# Patient Record
Sex: Female | Born: 1955 | Race: White | Hispanic: No | Smoking: Never smoker
Health system: Southern US, Community
[De-identification: ages and names within clinical notes are randomized; demographics above are authoritative.]

## PROBLEM LIST (undated history)

## (undated) DIAGNOSIS — R197 Diarrhea, unspecified: Secondary | ICD-10-CM

## (undated) DIAGNOSIS — E1165 Type 2 diabetes mellitus with hyperglycemia: Secondary | ICD-10-CM

## (undated) DIAGNOSIS — G4730 Sleep apnea, unspecified: Secondary | ICD-10-CM

## (undated) DIAGNOSIS — J45909 Unspecified asthma, uncomplicated: Secondary | ICD-10-CM

## (undated) DIAGNOSIS — R51 Headache: Secondary | ICD-10-CM

## (undated) DIAGNOSIS — R011 Cardiac murmur, unspecified: Secondary | ICD-10-CM

## (undated) DIAGNOSIS — I10 Essential (primary) hypertension: Secondary | ICD-10-CM

## (undated) DIAGNOSIS — T753XXA Motion sickness, initial encounter: Secondary | ICD-10-CM

## (undated) DIAGNOSIS — R519 Headache, unspecified: Secondary | ICD-10-CM

## (undated) DIAGNOSIS — K219 Gastro-esophageal reflux disease without esophagitis: Secondary | ICD-10-CM

## (undated) DIAGNOSIS — R0600 Dyspnea, unspecified: Secondary | ICD-10-CM

## (undated) DIAGNOSIS — H9190 Unspecified hearing loss, unspecified ear: Secondary | ICD-10-CM

## (undated) DIAGNOSIS — E119 Type 2 diabetes mellitus without complications: Secondary | ICD-10-CM

## (undated) HISTORY — DX: Type 2 diabetes mellitus with hyperglycemia: E11.65

## (undated) HISTORY — PX: BREAST EXCISIONAL BIOPSY: SUR124

---

## 2003-10-19 HISTORY — PX: BREAST BIOPSY: SHX20

## 2004-10-18 HISTORY — PX: ABDOMINAL HYSTERECTOMY: SHX81

## 2005-03-31 ENCOUNTER — Ambulatory Visit: Payer: Self-pay | Admitting: Obstetrics and Gynecology

## 2006-04-25 DIAGNOSIS — I152 Hypertension secondary to endocrine disorders: Secondary | ICD-10-CM | POA: Insufficient documentation

## 2006-04-29 ENCOUNTER — Ambulatory Visit: Payer: Self-pay | Admitting: Obstetrics and Gynecology

## 2007-05-02 ENCOUNTER — Ambulatory Visit: Payer: Self-pay | Admitting: Obstetrics and Gynecology

## 2008-05-03 ENCOUNTER — Ambulatory Visit: Payer: Self-pay | Admitting: Obstetrics and Gynecology

## 2008-05-17 ENCOUNTER — Ambulatory Visit: Payer: Self-pay

## 2008-06-07 ENCOUNTER — Ambulatory Visit: Payer: Self-pay | Admitting: Gastroenterology

## 2008-06-07 DIAGNOSIS — K5730 Diverticulosis of large intestine without perforation or abscess without bleeding: Secondary | ICD-10-CM | POA: Insufficient documentation

## 2008-07-30 DIAGNOSIS — G4730 Sleep apnea, unspecified: Secondary | ICD-10-CM | POA: Insufficient documentation

## 2009-05-14 ENCOUNTER — Ambulatory Visit: Payer: Self-pay | Admitting: Obstetrics and Gynecology

## 2009-11-17 ENCOUNTER — Ambulatory Visit: Payer: Self-pay | Admitting: Family Medicine

## 2009-12-11 ENCOUNTER — Ambulatory Visit: Payer: Self-pay | Admitting: Family Medicine

## 2009-12-11 DIAGNOSIS — E1169 Type 2 diabetes mellitus with other specified complication: Secondary | ICD-10-CM | POA: Insufficient documentation

## 2010-01-16 ENCOUNTER — Ambulatory Visit: Payer: Self-pay

## 2010-05-21 ENCOUNTER — Ambulatory Visit: Payer: Self-pay | Admitting: Obstetrics and Gynecology

## 2012-05-15 ENCOUNTER — Ambulatory Visit: Payer: Self-pay | Admitting: Family Medicine

## 2012-05-26 ENCOUNTER — Ambulatory Visit: Payer: Self-pay | Admitting: Family Medicine

## 2012-06-01 ENCOUNTER — Ambulatory Visit: Payer: Self-pay | Admitting: Obstetrics and Gynecology

## 2013-08-07 ENCOUNTER — Ambulatory Visit: Payer: Self-pay | Admitting: Obstetrics and Gynecology

## 2014-01-22 IMAGING — CR DG SHOULDER 3+V*L*
1 series · 3 of 3 positions shown · non-contrast
Comparison: None

REASON FOR EXAM: COMMENTS:

PROCEDURE:     KDR - KDXR SHOULDER LEFT COMPLETE  - May 15, 2012  [DATE]
RESULT:      History: Fall

[Series 1: internal rotate · 0.17mm/px · 3 of 3 slices shown]
[im 1/3]
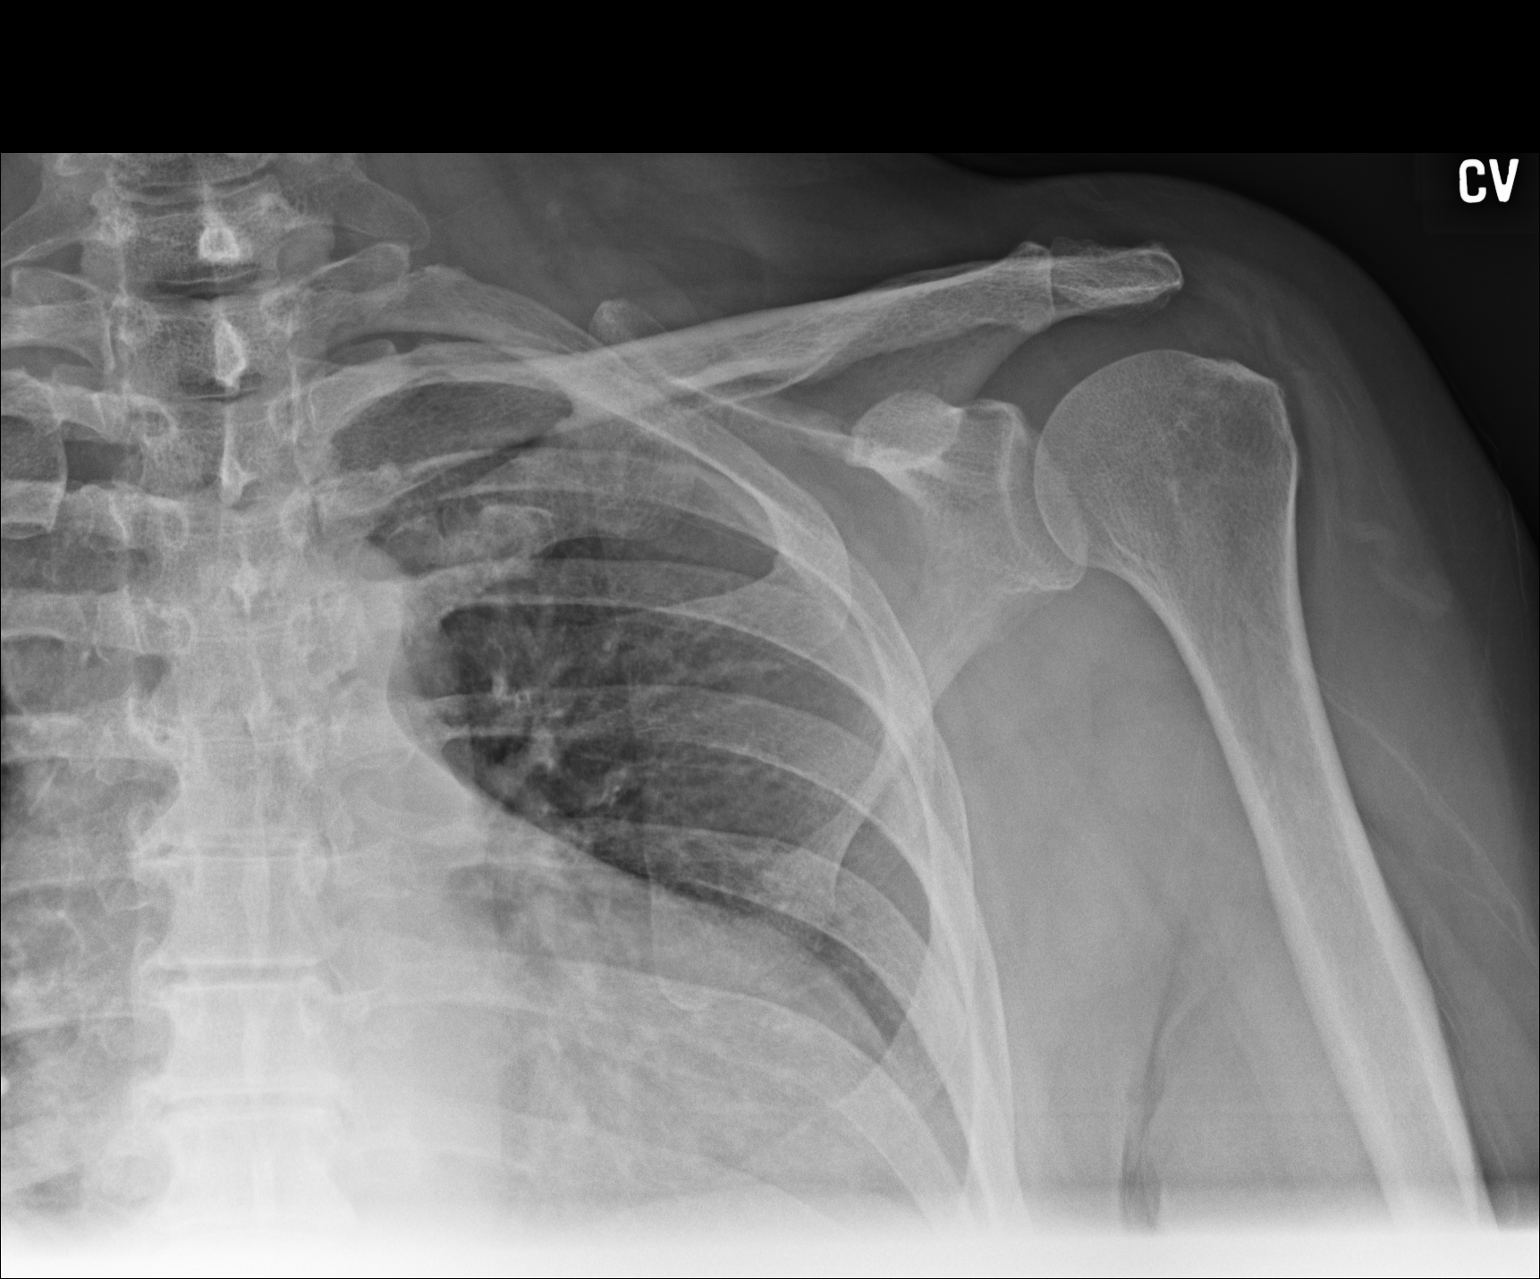
[im 2/3]
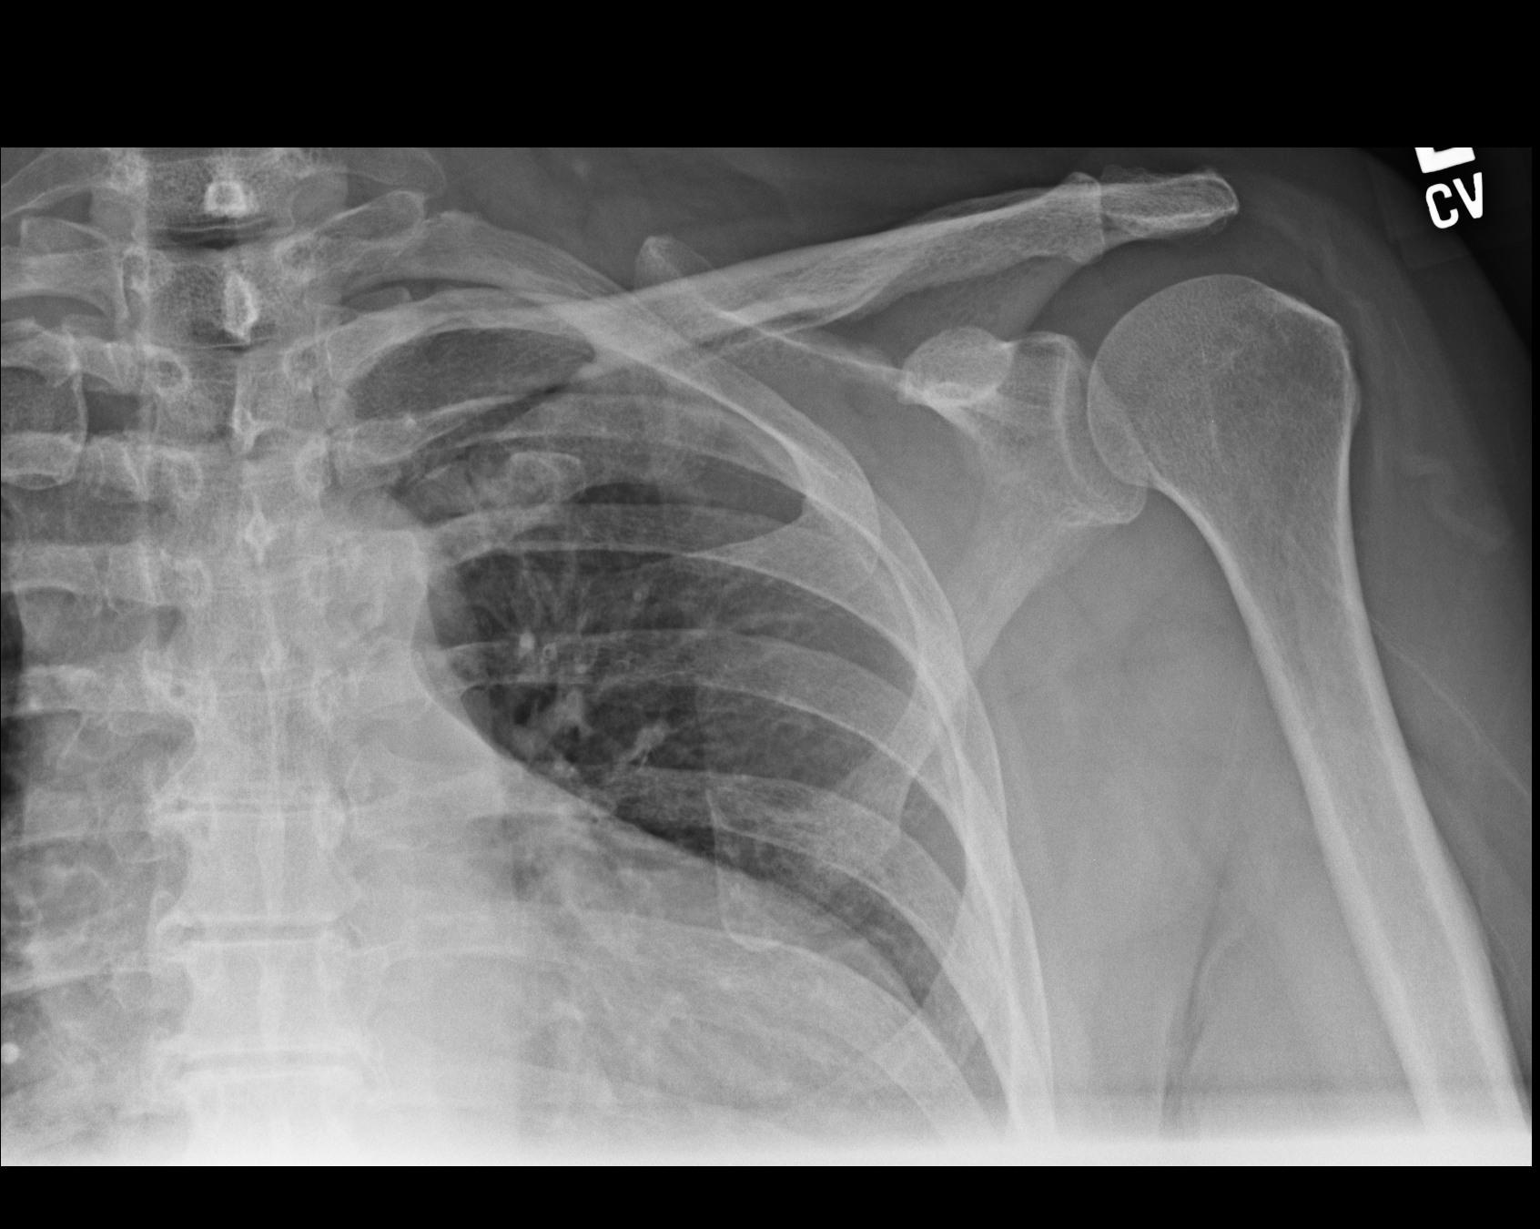
[im 3/3]
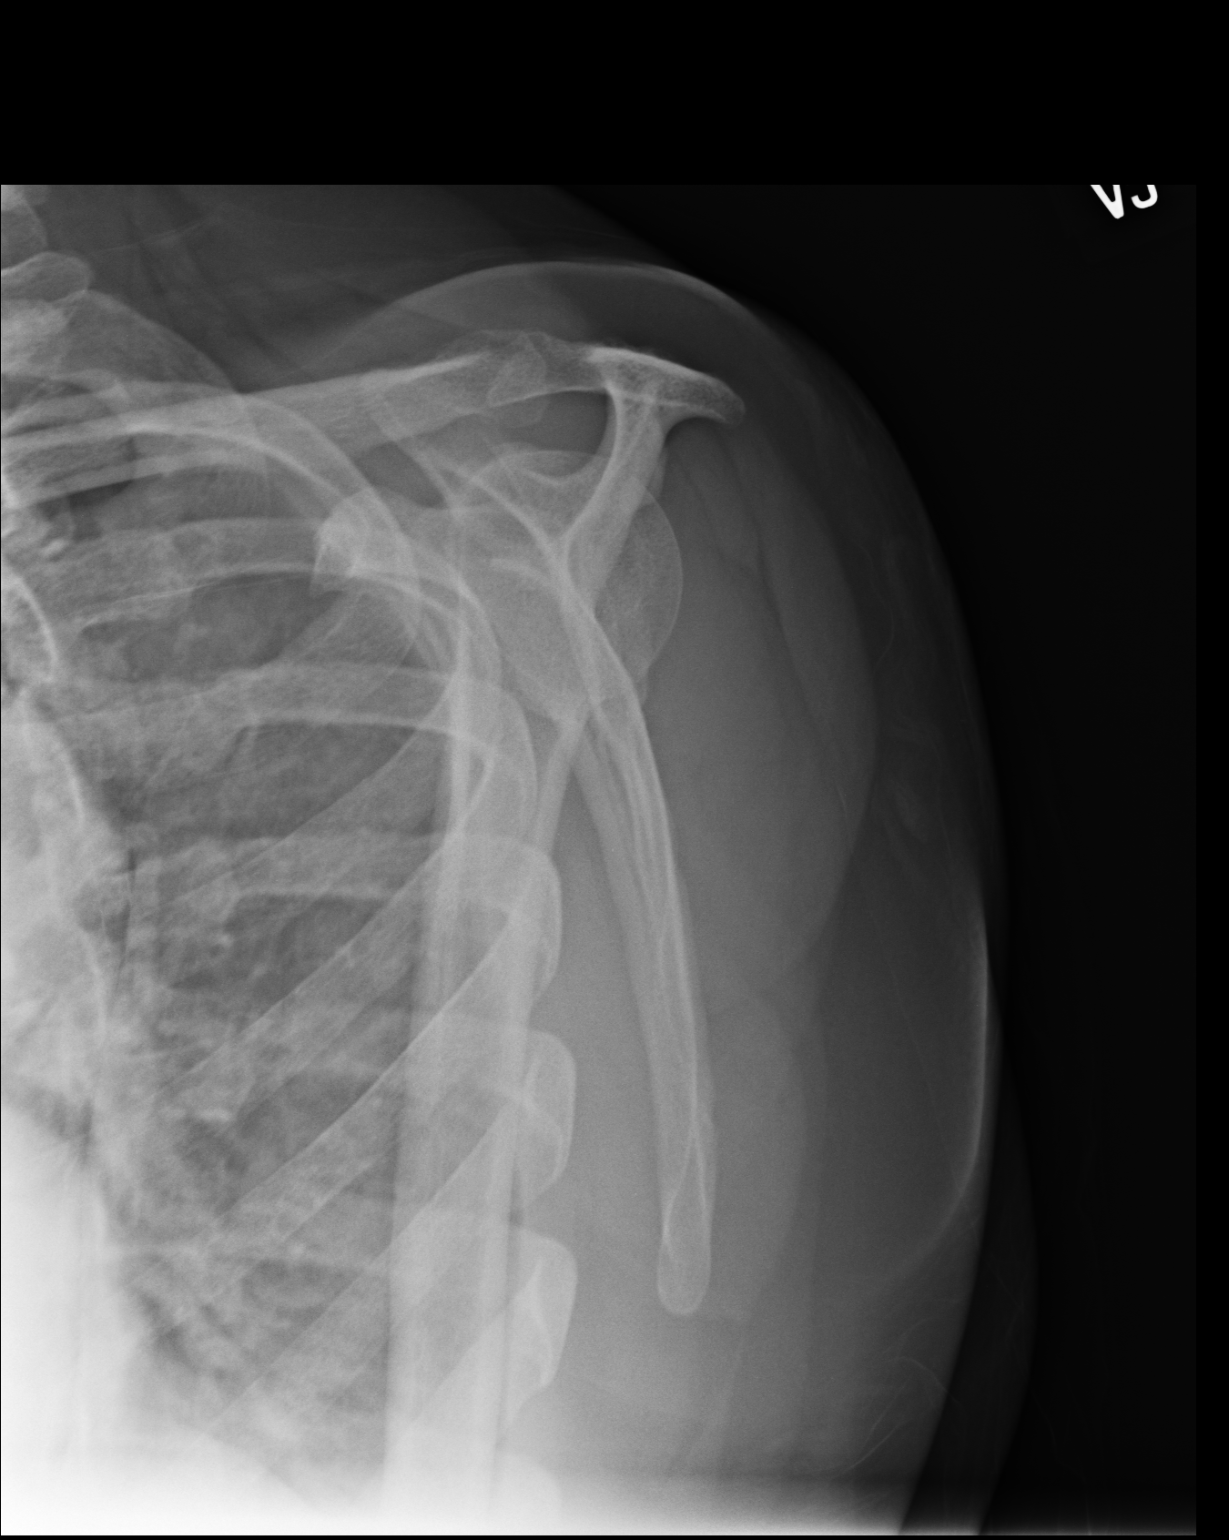

[3 of 3 positions shown; findings below may reference images not displayed]

FINDINGS: 3 views of the LEFT shoulder demonstrates no fracture or dislocation. The
acromioclavicular joint is normal.
IMPRESSION: No acute osseous injury of the LEFT shoulder.

[REDACTED]

## 2014-02-26 ENCOUNTER — Ambulatory Visit: Payer: Self-pay | Admitting: Family Medicine

## 2014-07-22 ENCOUNTER — Ambulatory Visit: Payer: Self-pay | Admitting: Family Medicine

## 2015-04-24 ENCOUNTER — Other Ambulatory Visit: Payer: Self-pay | Admitting: Family Medicine

## 2015-04-24 DIAGNOSIS — E119 Type 2 diabetes mellitus without complications: Secondary | ICD-10-CM

## 2015-04-24 NOTE — Telephone Encounter (Signed)
Will send refill to the pharmacy. Advise patient she will need follow up visit here before further refills needed.

## 2015-04-24 NOTE — Telephone Encounter (Signed)
Patient advised as directed below. Patient scheduled a follow up appointment on 05/05/15.

## 2015-04-29 DIAGNOSIS — M93969 Osteochondropathy, unspecified, unspecified lower leg: Secondary | ICD-10-CM | POA: Insufficient documentation

## 2015-04-29 DIAGNOSIS — Z8719 Personal history of other diseases of the digestive system: Secondary | ICD-10-CM | POA: Insufficient documentation

## 2015-04-29 DIAGNOSIS — IMO0002 Reserved for concepts with insufficient information to code with codable children: Secondary | ICD-10-CM | POA: Insufficient documentation

## 2015-04-29 DIAGNOSIS — H905 Unspecified sensorineural hearing loss: Secondary | ICD-10-CM | POA: Insufficient documentation

## 2015-04-29 DIAGNOSIS — E1165 Type 2 diabetes mellitus with hyperglycemia: Secondary | ICD-10-CM | POA: Insufficient documentation

## 2015-05-05 ENCOUNTER — Ambulatory Visit (INDEPENDENT_AMBULATORY_CARE_PROVIDER_SITE_OTHER): Payer: BLUE CROSS/BLUE SHIELD | Admitting: Family Medicine

## 2015-05-05 ENCOUNTER — Encounter: Payer: Self-pay | Admitting: Family Medicine

## 2015-05-05 VITALS — BP 128/80 | HR 74 | Temp 99.0°F | Resp 16 | Ht 62.0 in | Wt 204.6 lb

## 2015-05-05 DIAGNOSIS — E7800 Pure hypercholesterolemia, unspecified: Secondary | ICD-10-CM

## 2015-05-05 DIAGNOSIS — I10 Essential (primary) hypertension: Secondary | ICD-10-CM

## 2015-05-05 DIAGNOSIS — IMO0002 Reserved for concepts with insufficient information to code with codable children: Secondary | ICD-10-CM

## 2015-05-05 DIAGNOSIS — E1165 Type 2 diabetes mellitus with hyperglycemia: Secondary | ICD-10-CM | POA: Diagnosis not present

## 2015-05-05 MED ORDER — GLYBURIDE 2.5 MG PO TABS: 2.5000 mg | ORAL_TABLET | Freq: Two times a day (BID) | ORAL | Status: DC

## 2015-05-05 MED ORDER — ATENOLOL 50 MG PO TABS: 50.0000 mg | ORAL_TABLET | Freq: Every day | ORAL | Status: DC

## 2015-05-05 NOTE — Progress Notes (Signed)
Subjective:    Patient ID: Shari Lee, female    DOB: 03-17-1956, 59 y.o.   MRN: [id number] Chief Complaint  Patient presents with  . Hypertension    follow-up, last office visit was on 07/22/2014, needs medication refill  . Diabetes    follow-up    HPI  This 59 year old female presents for follow up of diabetes. Admits she has not checked blood sugar at home in a year. Notice some headaches, polydipsia, vision out of focus, more pins and needles pains in the left 3rd and 4th toes and weight loss without effort. Had taken Glyburide 2.5 mg only once a day. Also, needs refill of Lotrel and Tenormin. Has been getting that everyday without side effects.  Has not seen ophthalmologist in 2 years.  No past medical history on file. Patient Active Problem List   Diagnosis Date Noted  . 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 Diabetes mellitus type 2, uncontrolled 04/29/2015  . History of digestive disease 04/29/2015  . 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 Hypercholesterolemia without hypertriglyceridemia 12/11/2009  . Apnea, sleep 07/30/2008  . Colon, diverticulosis 06/07/2008  . Essential (primary) hypertension 04/25/2006   History  Substance Use Topics  . Smoking status: Never Smoker   . Smokeless tobacco: Never Used  . Alcohol Use: No   Past Surgical History  Procedure Laterality Date  . Abdominal hysterectomy  2006   Family History  Problem Relation Age of Onset  . Heart attack Mother   . Diabetes Mother   . Heart disease Mother   . Heart failure Father   . Breast cancer Sister   . Sleep apnea Brother   . Diverticulitis Brother   . Diverticulitis Sister    Current Outpatient Prescriptions on File Prior to Visit  Medication Sig Dispense Refill  . amLODipine-benazepril (LOTREL) 5-20 MG per capsule Take 1 capsule by mouth daily.    Marland Kitchen atenolol (TENORMIN) 50 MG tablet TAKE ONE TABLET BY MOUTH ONCE DAILY 90 tablet 0  . glyBURIDE (DIABETA) 2.5 MG tablet TAKE ONE  TABLET BY MOUTH TWICE DAILY 180 tablet 0   No current facility-administered medications on file prior to visit.   Allergies  Allergen Reactions  . Amoxicillin-Pot Clavulanate   . Levsin  [Hyoscyamine]     Other reaction(s): Dizzyness  . Penicillins    Review of Systems  Constitutional: Positive for unexpected weight change.  Eyes: Positive for visual disturbance.       Poor focus recently.  Respiratory: Negative.   Cardiovascular: Positive for chest pain. Negative for palpitations.       Rare sudden pressure in the right upper chest lasting a second or two. No dyspnea or diaphoresis. No palpitations.  Gastrointestinal: Positive for nausea. Negative for diarrhea and constipation.       Some reflux dyspepsia with eating "red sauces", and tea. Antacids help relief these rare occurrences.  Endocrine: Positive for polydipsia.  Genitourinary: Negative.   Neurological: Positive for headaches.       Started having frontal headache she associates with poor vision over the past 3-4 months. Tylenol will help but they only occur 2-3 times a week in the mornings. Pins and needles sensation in the left 4th and 5th toes intermittently.      BP 128/80 mmHg  Pulse 74  Temp(Src) 99 F (37.2 C) (Oral)  Resp 16  Ht 5\' 2"  (1.575 m)  Wt 204 lb 9.6 oz (92.806 kg)  BMI 37.41 kg/m2  Objective:   Physical Exam  Constitutional: She is oriented to person, place, and time. She appears well-developed and well-nourished.  HENT:  Head: Normocephalic and atraumatic.  Right Ear: External ear normal.  Left Ear: External ear normal.  Nose: Nose normal.  Mouth/Throat: Oropharynx is clear and moist.  Bilateral congenital hearing loss - no hearing aids.  Eyes: Conjunctivae and EOM are normal. Pupils are equal, round, and reactive to light.  Neck: Normal range of motion. Neck supple. No thyromegaly present.  Cardiovascular: Normal rate, regular rhythm, normal heart sounds and intact distal pulses.   Large  cluster of superficial varicose veins on left anterior lower leg - asymptomatic without edema.  Pulmonary/Chest: Effort normal and breath sounds normal.  Abdominal: Soft. Bowel sounds are normal.  Musculoskeletal: Normal range of motion.  Neurological: She is alert and oriented to person, place, and time.  Normal sensation to test both feet with nylon string.  Psychiatric: She has a normal mood and affect. Her behavior is normal.      Assessment & Plan:  1. Diabetes mellitus type 2, uncontrolled Symptoms of worsening diabetes. Poor compliance with follow up and only taking Glyburide once a day. Will give new glucometer (OneTouch VerioFlex) to check FBS daily. Suspect development of peripheral neuropathy in the left toes. Recheck labs and follow up pending reports. - glyBURIDE (DIABETA) 2.5 MG tablet; Take 1 tablet (2.5 mg total) by mouth 2 (two) times daily.  Dispense: 180 tablet; Refill: 0 - Hemoglobin A1c - Comprehensive metabolic panel - CBC with Differential/Platelet  2. Essential (primary) hypertension Good control with Lotrel 5-20 mg qd and Tenormin 50 mg qd without side effects. Good tolerance and fair compliance with medication. Recheck labs and follow up in 3 months. - atenolol (TENORMIN) 50 MG tablet; Take 1 tablet (50 mg total) by mouth daily.  Dispense: 90 tablet; Refill: 3 - TSH  3. Hypercholesterolemia without hypertriglyceridemia Poor diet control and not exercising to any significant degree. Will recheck lipids. - Lipid panel - TSH

## 2015-05-06 LAB — COMPREHENSIVE METABOLIC PANEL
A/G RATIO: 1.4 (ref 1.1–2.5)
ALK PHOS: 88 IU/L (ref 39–117)
ALT: 29 IU/L (ref 0–32)
AST: 25 IU/L (ref 0–40)
Albumin: 4.2 g/dL (ref 3.5–5.5)
BUN / CREAT RATIO: 17 (ref 9–23)
BUN: 14 mg/dL (ref 6–24)
Bilirubin Total: 0.3 mg/dL (ref 0.0–1.2)
CHLORIDE: 96 mmol/L — AB (ref 97–108)
CO2: 22 mmol/L (ref 18–29)
CREATININE: 0.84 mg/dL (ref 0.57–1.00)
Calcium: 9.3 mg/dL (ref 8.7–10.2)
GFR calc non Af Amer: 77 mL/min/{1.73_m2} (ref >59)
GFR, EST AFRICAN AMERICAN: 89 mL/min/{1.73_m2} (ref >59)
Globulin, Total: 3.1 g/dL (ref 1.5–4.5)
Glucose: 391 mg/dL — ABNORMAL HIGH (ref 65–99)
POTASSIUM: 5 mmol/L (ref 3.5–5.2)
Sodium: 136 mmol/L (ref 134–144)
Total Protein: 7.3 g/dL (ref 6.0–8.5)

## 2015-05-06 LAB — CBC WITH DIFFERENTIAL/PLATELET
BASOS ABS: 0.1 10*3/uL (ref 0.0–0.2)
BASOS: 1 %
EOS (ABSOLUTE): 0.1 10*3/uL (ref 0.0–0.4)
Eos: 1 %
Hematocrit: 43.4 % (ref 34.0–46.6)
Hemoglobin: 13.9 g/dL (ref 11.1–15.9)
IMMATURE GRANS (ABS): 0 10*3/uL (ref 0.0–0.1)
IMMATURE GRANULOCYTES: 0 %
LYMPHS: 24 %
Lymphocytes Absolute: 2.4 10*3/uL (ref 0.7–3.1)
MCH: 27.6 pg (ref 26.6–33.0)
MCHC: 32 g/dL (ref 31.5–35.7)
MCV: 86 fL (ref 79–97)
Monocytes Absolute: 0.7 10*3/uL (ref 0.1–0.9)
Monocytes: 7 %
Neutrophils Absolute: 6.8 10*3/uL (ref 1.4–7.0)
Neutrophils: 67 %
PLATELETS: 399 10*3/uL — AB (ref 150–379)
RBC: 5.03 x10E6/uL (ref 3.77–5.28)
RDW: 14.2 % (ref 12.3–15.4)
WBC: 10.1 10*3/uL (ref 3.4–10.8)

## 2015-05-06 LAB — LIPID PANEL
CHOLESTEROL TOTAL: 170 mg/dL (ref 100–199)
Chol/HDL Ratio: 3.1 ratio units (ref 0.0–4.4)
HDL: 54 mg/dL (ref >39)
LDL Calculated: 100 mg/dL — ABNORMAL HIGH (ref 0–99)
TRIGLYCERIDES: 82 mg/dL (ref 0–149)
VLDL Cholesterol Cal: 16 mg/dL (ref 5–40)

## 2015-05-06 LAB — HEMOGLOBIN A1C
Est. average glucose Bld gHb Est-mCnc: 329 mg/dL
Hgb A1c MFr Bld: 13.1 % — ABNORMAL HIGH (ref 4.8–5.6)

## 2015-05-06 LAB — TSH: TSH: 2.51 u[IU]/mL (ref 0.450–4.500)

## 2015-07-28 ENCOUNTER — Encounter: Payer: Self-pay | Admitting: Family Medicine

## 2015-07-28 ENCOUNTER — Ambulatory Visit (INDEPENDENT_AMBULATORY_CARE_PROVIDER_SITE_OTHER): Payer: BLUE CROSS/BLUE SHIELD | Admitting: Family Medicine

## 2015-07-28 ENCOUNTER — Other Ambulatory Visit: Payer: Self-pay

## 2015-07-28 VITALS — BP 128/82 | HR 77 | Temp 98.3°F | Resp 16 | Wt 200.6 lb

## 2015-07-28 DIAGNOSIS — E119 Type 2 diabetes mellitus without complications: Secondary | ICD-10-CM | POA: Insufficient documentation

## 2015-07-28 DIAGNOSIS — Z23 Encounter for immunization: Secondary | ICD-10-CM | POA: Diagnosis not present

## 2015-07-28 DIAGNOSIS — E1100 Type 2 diabetes mellitus with hyperosmolarity without nonketotic hyperglycemic-hyperosmolar coma (NKHHC): Secondary | ICD-10-CM

## 2015-07-28 DIAGNOSIS — E7800 Pure hypercholesterolemia, unspecified: Secondary | ICD-10-CM | POA: Diagnosis not present

## 2015-07-28 DIAGNOSIS — I10 Essential (primary) hypertension: Secondary | ICD-10-CM

## 2015-07-28 MED ORDER — ONETOUCH ULTRASOFT LANCETS MISC: Status: DC

## 2015-07-28 MED ORDER — GLUCOSE BLOOD VI STRP
ORAL_STRIP | Status: DC
Start: 2015-07-28 — End: 2016-10-15

## 2015-07-28 MED ORDER — GLUCOSE BLOOD VI STRP: ORAL_STRIP | Status: DC

## 2015-07-28 MED ORDER — GLYBURIDE 2.5 MG PO TABS: 2.5000 mg | ORAL_TABLET | Freq: Two times a day (BID) | ORAL | Status: DC

## 2015-07-28 NOTE — Progress Notes (Signed)
Patient ID: Shari Lee, female   DOB: 05-22-1956, 59 y.o.   MRN: [id number]    Subjective:  Diabetes She presents for her follow-up diabetic visit. She has type 2 diabetes mellitus. No MedicAlert identification noted. Her disease course has been stable. There are no hypoglycemic associated symptoms. Associated symptoms include blurred vision, polydipsia and polyuria. Pertinent negatives for diabetes include no chest pain and no polyphagia. Symptoms are improving. There are no diabetic complications. Risk factors for coronary artery disease include dyslipidemia, diabetes mellitus and hypertension. Current diabetic treatment includes oral agent (monotherapy) and diet. She is compliant with treatment most of the time. She is following a diabetic diet. She monitors blood glucose at home 5+ x per day. Blood glucose monitoring compliance is good. Her home blood glucose trend is decreasing steadily. An ACE inhibitor/angiotensin II receptor blocker is being taken. Eye exam is current.  Hypertension This is a chronic problem. The current episode started more than 1 year ago. The problem is unchanged. The problem is controlled. Associated symptoms include blurred vision. Pertinent negatives include no chest pain. Past treatments include ACE inhibitors, calcium channel blockers and beta blockers. There are no compliance problems.   Hyperlipidemia This is a chronic problem. The problem is controlled. Pertinent negatives include no chest pain.     Prior to Admission medications   Medication Sig Start Date End Date Taking? Authorizing Provider  amLODipine-benazepril (LOTREL) 5-20 MG per capsule Take 1 capsule by mouth daily. 03/06/15  Yes Historical Provider, MD  atenolol (TENORMIN) 50 MG tablet Take 1 tablet (50 mg total) by mouth daily. 05/05/15  Yes Dennis E Chrismon, PA  glyBURIDE (DIABETA) 2.5 MG tablet Take 1 tablet (2.5 mg total) by mouth 2 (two) times daily. 05/05/15  Yes Margo Common, PA     Patient Active Problem List   Diagnosis Date Noted  . 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 Diabetes mellitus type 2, uncontrolled ([place]) 04/29/2015  . History of digestive disease 04/29/2015  . 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 Hypercholesterolemia without hypertriglyceridemia 12/11/2009  . Apnea, sleep 07/30/2008  . Colon, diverticulosis 06/07/2008  . Essential (primary) hypertension 04/25/2006    History reviewed. No pertinent past medical history.  Social History   Social History  . Marital Status: Divorced    Spouse Name: N/A  . Number of Children: N/A  . Years of Education: N/A   Occupational History  . Not on file.   Social History Main Topics  . Smoking status: Never Smoker   . Smokeless tobacco: Never Used  . Alcohol Use: No  . Drug Use: No  . Sexual Activity: Not on file   Other Topics Concern  . Not on file   Social History Narrative    Allergies  Allergen Reactions  . Amoxicillin-Pot Clavulanate   . Levsin  [Hyoscyamine]     Other reaction(s): Dizzyness  . Penicillins     Review of Systems  Constitutional: Negative.   HENT: Positive for hearing loss.        Congenital  Eyes: Positive for blurred vision.  Respiratory: Negative.   Cardiovascular: Negative.  Negative for chest pain.  Gastrointestinal: Negative.  Negative for nausea and diarrhea.  Genitourinary: Negative.   Musculoskeletal: Negative.   Skin: Negative.   Neurological: Negative.  Negative for tingling.  Endo/Heme/Allergies: Positive for polydipsia. Negative for polyphagia.  Psychiatric/Behavioral: Negative.     There is no immunization history on file for this patient. Objective:  BP 128/82 mmHg  Pulse 77  Temp(Src)  98.3 F (36.8 C) (Oral)  Resp 16  Wt 200 lb 9.6 oz (90.992 kg)  SpO2 97% Wt Readings from Last 3 Encounters:  07/28/15 200 lb 9.6 oz (90.992 kg)  05/05/15 204 lb 9.6 oz (92.806 kg)  07/22/14 209 lb (94.802 kg)    Physical Exam   Constitutional: She is oriented to person, place, and time and well-developed, well-nourished, and in no distress.  HENT:  Head: Normocephalic and atraumatic.  Congenital hearing loss (good lip reader).  Eyes: Conjunctivae and EOM are normal.  Neck: Normal range of motion. Neck supple.  Cardiovascular: Normal rate, regular rhythm and normal heart sounds.   Pulmonary/Chest: Effort normal and breath sounds normal.  Abdominal: Soft. Bowel sounds are normal.  Musculoskeletal: Normal range of motion.  Neurological: She is alert and oriented to person, place, and time.  Psychiatric: Affect and judgment normal.    Lab Results  Component Value Date   WBC 10.1 05/05/2015   HCT 43.4 05/05/2015   GLUCOSE 391* 05/05/2015   CHOL 170 05/05/2015   TRIG 82 05/05/2015   HDL 54 05/05/2015   LDLCALC 100* 05/05/2015   TSH 2.510 05/05/2015   HGBA1C 13.1* 05/05/2015    CMP     Component Value Date/Time   NA 136 05/05/2015 0951   K 5.0 05/05/2015 0951   CL 96* 05/05/2015 0951   CO2 22 05/05/2015 0951   GLUCOSE 391* 05/05/2015 0951   BUN 14 05/05/2015 0951   CREATININE 0.84 05/05/2015 0951   CALCIUM 9.3 05/05/2015 0951   PROT 7.3 05/05/2015 0951   ALBUMIN 4.2 05/05/2015 0951   AST 25 05/05/2015 0951   ALT 29 05/05/2015 0951   ALKPHOS 88 05/05/2015 0951   BILITOT 0.3 05/05/2015 0951   GFRNONAA 77 05/05/2015 0951   GFRAA 89 05/05/2015 0951    Assessment and Plan :  1. Type 2 diabetes mellitus with hyperosmolarity without coma, without long-term current use of insulin (HCC) Improving control since the "wake up call" of blood sugar elevating above 400 on 07-11-15. Has lost 4 lbs and continue to take Glyburide 2.5 mg BID. Had eye exam by Dr. Gloriann Loan 1 week ago and no retinopathy reported to patient (some early developing cataracts) FBS 86 this morning and was 93 yesterday evening. Continue new diet and medications. Recheck labs and follow up in 3 months. - glucose blood test strip; Use as  instructed. Test fasting blood sugar each morning.  Dispense: 100 each; Refill: 12 - Lancets (ONETOUCH ULTRASOFT) lancets; Use as instructed  Dispense: 100 each; Refill: 12 - Hemoglobin A1c - Comprehensive metabolic panel - CBC with Differential/Platelet - glyBURIDE (DIABETA) 2.5 MG tablet; Take 1 tablet (2.5 mg total) by mouth 2 (two) times daily.  Dispense: 180 tablet; Refill: 2  2. Hypercholesterolemia without hypertriglyceridemia Last lipid panel 3 months ago was in fair shape. Will recheck progress with new diet plan. - Lipid panel  3. Essential (primary) hypertension Stable and tolerating Lotrel with Tenormin without side effects. Continue present dosages. Recheck in 3 months.  4. Need for influenza vaccination Requested flu shot today. Wants to check for insurance coverage for pneumonia vaccine and check records for last Tdap. - Flu Vaccine QUAD 36+ mos PF IM (Fluarix & Fluzone Quad PF)   Vernie Murders [facility] 07/28/2015 8:40 AM

## 2015-07-29 ENCOUNTER — Telehealth: Payer: Self-pay

## 2015-07-29 LAB — CBC WITH DIFFERENTIAL/PLATELET
Basophils Absolute: 0.1 10*3/uL (ref 0.0–0.2)
Basos: 1 %
EOS (ABSOLUTE): 0.1 10*3/uL (ref 0.0–0.4)
EOS: 1 %
HEMATOCRIT: 41.9 % (ref 34.0–46.6)
HEMOGLOBIN: 13.8 g/dL (ref 11.1–15.9)
Immature Grans (Abs): 0 10*3/uL (ref 0.0–0.1)
Immature Granulocytes: 0 %
LYMPHS ABS: 2.7 10*3/uL (ref 0.7–3.1)
Lymphs: 28 %
MCH: 28.5 pg (ref 26.6–33.0)
MCHC: 32.9 g/dL (ref 31.5–35.7)
MCV: 87 fL (ref 79–97)
MONOCYTES: 6 %
Monocytes Absolute: 0.6 10*3/uL (ref 0.1–0.9)
NEUTROS ABS: 6.2 10*3/uL (ref 1.4–7.0)
Neutrophils: 64 %
Platelets: 416 10*3/uL — ABNORMAL HIGH (ref 150–379)
RBC: 4.84 x10E6/uL (ref 3.77–5.28)
RDW: 14.1 % (ref 12.3–15.4)
WBC: 9.7 10*3/uL (ref 3.4–10.8)

## 2015-07-29 LAB — COMPREHENSIVE METABOLIC PANEL
ALBUMIN: 4.5 g/dL (ref 3.5–5.5)
ALT: 26 IU/L (ref 0–32)
AST: 21 IU/L (ref 0–40)
Albumin/Globulin Ratio: 1.5 (ref 1.1–2.5)
Alkaline Phosphatase: 67 IU/L (ref 39–117)
BUN / CREAT RATIO: 24 — AB (ref 9–23)
BUN: 20 mg/dL (ref 6–24)
Bilirubin Total: 0.3 mg/dL (ref 0.0–1.2)
CO2: 21 mmol/L (ref 18–29)
CREATININE: 0.85 mg/dL (ref 0.57–1.00)
Calcium: 10.2 mg/dL (ref 8.7–10.2)
Chloride: 100 mmol/L (ref 97–108)
GFR, EST AFRICAN AMERICAN: 87 mL/min/{1.73_m2} (ref >59)
GFR, EST NON AFRICAN AMERICAN: 76 mL/min/{1.73_m2} (ref >59)
GLUCOSE: 159 mg/dL — AB (ref 65–99)
Globulin, Total: 3 g/dL (ref 1.5–4.5)
Potassium: 5.1 mmol/L (ref 3.5–5.2)
Sodium: 140 mmol/L (ref 134–144)
TOTAL PROTEIN: 7.5 g/dL (ref 6.0–8.5)

## 2015-07-29 LAB — HEMOGLOBIN A1C
Est. average glucose Bld gHb Est-mCnc: 278 mg/dL
Hgb A1c MFr Bld: 11.3 % — ABNORMAL HIGH (ref 4.8–5.6)

## 2015-07-29 LAB — LIPID PANEL
CHOL/HDL RATIO: 3.7 ratio (ref 0.0–4.4)
Cholesterol, Total: 178 mg/dL (ref 100–199)
HDL: 48 mg/dL (ref >39)
LDL CALC: 115 mg/dL — AB (ref 0–99)
TRIGLYCERIDES: 76 mg/dL (ref 0–149)
VLDL Cholesterol Cal: 15 mg/dL (ref 5–40)

## 2015-07-29 NOTE — Telephone Encounter (Signed)
-----   Message from Margo Common, [place] sent at 07/29/2015  9:58 AM EDT ----- Very good improvement in Hgb A1C and blood sugar. Continue diet, diabetes medication and exercise with weight loss efforts. Platelets are elevated and should get a Baby ASA to thin blood a little. Schedule recheck appointment in 3 months.

## 2015-07-29 NOTE — Telephone Encounter (Signed)
LMTCB

## 2015-07-29 NOTE — Telephone Encounter (Signed)
Patient advised as directed below. Patient verbalized understanding. Patient has already scheduled a 3 month follow up appointment.

## 2015-08-05 ENCOUNTER — Ambulatory Visit: Payer: BLUE CROSS/BLUE SHIELD | Admitting: Family Medicine

## 2015-09-04 ENCOUNTER — Encounter: Payer: Self-pay | Admitting: Family Medicine

## 2015-09-04 ENCOUNTER — Ambulatory Visit (INDEPENDENT_AMBULATORY_CARE_PROVIDER_SITE_OTHER): Payer: BLUE CROSS/BLUE SHIELD | Admitting: Family Medicine

## 2015-09-04 VITALS — BP 116/72 | HR 91 | Temp 97.6°F | Resp 18 | Wt 196.8 lb

## 2015-09-04 DIAGNOSIS — L299 Pruritus, unspecified: Secondary | ICD-10-CM | POA: Diagnosis not present

## 2015-09-04 DIAGNOSIS — E1100 Type 2 diabetes mellitus with hyperosmolarity without nonketotic hyperglycemic-hyperosmolar coma (NKHHC): Secondary | ICD-10-CM

## 2015-09-04 DIAGNOSIS — L308 Other specified dermatitis: Secondary | ICD-10-CM

## 2015-09-04 MED ORDER — TRIAMCINOLONE ACETONIDE 0.1 % EX CREA: 1.0000 "application " | TOPICAL_CREAM | Freq: Three times a day (TID) | CUTANEOUS | Status: DC

## 2015-09-04 NOTE — Progress Notes (Signed)
Patient ID: Shari Lee, female   DOB: [birth year], 59 y.o.   MRN: [id number] Name: Shari Lee   MRN: [id number]    DOB: 01/26/1956   Date:09/04/2015       Progress Note  Subjective  Chief Complaint  Chief Complaint  Patient presents with  . Rash   Rash This is a new problem. The current episode started in the past 7 days. The problem has been gradually worsening since onset. The affected locations include the left shoulder, right upper leg and right arm. The rash is characterized by itchiness. Pertinent negatives include no congestion, fever, shortness of breath or sore throat. Past treatments include antihistamine. The treatment provided no relief.   Patient Active Problem List   Diagnosis Date Noted  . Type 2 diabetes mellitus ([place]) 07/28/2015  . 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 Diabetes mellitus type 2, uncontrolled ([place]) 04/29/2015  . History of digestive disease 04/29/2015  . 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 Hypercholesterolemia without hypertriglyceridemia 12/11/2009  . Apnea, sleep 07/30/2008  . Colon, diverticulosis 06/07/2008  . Essential (primary) hypertension 04/25/2006   Past Surgical History  Procedure Laterality Date  . Abdominal hysterectomy  2006   Family History  Problem Relation Age of Onset  . Heart attack Mother   . Diabetes Mother   . Heart disease Mother   . Heart failure Father   . Breast cancer Sister   . Sleep apnea Brother   . Diverticulitis Brother   . Diverticulitis Sister    Social History  Substance Use Topics  . Smoking status: Never Smoker   . Smokeless tobacco: Never Used  . Alcohol Use: No    Current outpatient prescriptions:  .  amLODipine-benazepril (LOTREL) 5-20 MG per capsule, Take 1 capsule by mouth daily., Disp: , Rfl:  .  atenolol (TENORMIN) 50 MG tablet, Take 1 tablet (50 mg total) by mouth daily., Disp: 90 tablet, Rfl: 3 .  glucose blood (ONETOUCH VERIO) test strip, Use as instructed, Disp: 100 each,  Rfl: 12 .  glucose blood test strip, Use as instructed. Test fasting blood sugar each morning., Disp: 100 each, Rfl: 12 .  glyBURIDE (DIABETA) 2.5 MG tablet, Take 1 tablet (2.5 mg total) by mouth 2 (two) times daily., Disp: 180 tablet, Rfl: 2 .  Lancets (ONETOUCH ULTRASOFT) lancets, Use as instructed, Disp: 100 each, Rfl: 12  Allergies  Allergen Reactions  . Amoxicillin-Pot Clavulanate   . Levsin  [Hyoscyamine]     Other reaction(s): Dizzyness  . Penicillins    Review of Systems  Constitutional: Negative.  Negative for fever.  HENT: Negative.  Negative for congestion and sore throat.   Eyes: Negative.   Respiratory: Negative.  Negative for shortness of breath.   Cardiovascular: Negative.   Gastrointestinal: Negative.   Genitourinary: Negative.   Musculoskeletal: Negative.   Skin: Positive for rash.  Neurological: Negative.   Endo/Heme/Allergies: Negative.   Psychiatric/Behavioral: Negative.    Objective  Filed Vitals:   09/04/15 0838  BP: 116/72  Pulse: 91  Temp: 97.6 F (36.4 C)  TempSrc: Oral  Resp: 18  Weight: 196 lb 12.8 oz (89.268 kg)  SpO2: 98%   Wt Readings from Last 3 Encounters:  09/04/15 196 lb 12.8 oz (89.268 kg)  07/28/15 200 lb 9.6 oz (90.992 kg)  05/05/15 204 lb 9.6 oz (92.806 kg)   Physical Exam  Constitutional: She is oriented to person, place, and time and well-developed, well-nourished, and in no distress.  HENT:  Head: Normocephalic.  Neck: Normal  range of motion. Neck supple.  Cardiovascular: Normal rate.   Pulmonary/Chest: Effort normal and breath sounds normal.  Neurological: She is alert and oriented to person, place, and time.  Skin: Rash noted.  Pruritic erythematous whelps around right elbow and upper arm, left flank and one on left lower calf.  Psychiatric: Affect and judgment normal.    Recent Results (from the past 2160 hour(s))  Hemoglobin A1c     Status: Abnormal   Collection Time: 07/28/15  9:53 AM  Result Value Ref Range    Hgb A1c MFr Bld 11.3 (H) 4.8 - 5.6 %    Comment:          Pre-diabetes: 5.7 - 6.4          Diabetes: >6.4          Glycemic control for adults with diabetes: <7.0    Est. average glucose Bld gHb Est-mCnc 278 mg/dL  Comprehensive metabolic panel     Status: Abnormal   Collection Time: 07/28/15  9:53 AM  Result Value Ref Range   Glucose 159 (H) 65 - 99 mg/dL   BUN 20 6 - 24 mg/dL   Creatinine, Ser 0.85 0.57 - 1.00 mg/dL   GFR calc non Af Amer 76 >59 mL/min/1.73   GFR calc Af Amer 87 >59 mL/min/1.73   BUN/Creatinine Ratio 24 (H) 9 - 23   Sodium 140 134 - 144 mmol/L    Comment: **Effective August 04, 2015 the reference interval**   for Sodium, Serum will be changing to:                                             136 - 144    Potassium 5.1 3.5 - 5.2 mmol/L    Comment: **Effective August 04, 2015 the reference interval**   for Potassium, Serum will be changing to:                          0 -  7 days        3.7 - 5.2                          8 - 30 days        3.7 - 6.4                          1 -  6 months      3.8 - 6.0                   7 months -  1 year        3.8 - 5.3                              >1 year        3.5 - 5.2    Chloride 100 97 - 108 mmol/L    Comment: **Effective August 04, 2015 the reference interval**   for Chloride, Serum will be changing to:  97 - 106    CO2 21 18 - 29 mmol/L   Calcium 10.2 8.7 - 10.2 mg/dL   Total Protein 7.5 6.0 - 8.5 g/dL   Albumin 4.5 3.5 - 5.5 g/dL   Globulin, Total 3.0 1.5 - 4.5 g/dL   Albumin/Globulin Ratio 1.5 1.1 - 2.5   Bilirubin Total 0.3 0.0 - 1.2 mg/dL   Alkaline Phosphatase 67 39 - 117 IU/L   AST 21 0 - 40 IU/L   ALT 26 0 - 32 IU/L  CBC with Differential/Platelet     Status: Abnormal   Collection Time: 07/28/15  9:53 AM  Result Value Ref Range   WBC 9.7 3.4 - 10.8 x10E3/uL   RBC 4.84 3.77 - 5.28 x10E6/uL   Hemoglobin 13.8 11.1 - 15.9 g/dL   Hematocrit 41.9 34.0 - 46.6 %    MCV 87 79 - 97 fL   MCH 28.5 26.6 - 33.0 pg   MCHC 32.9 31.5 - 35.7 g/dL   RDW 14.1 12.3 - 15.4 %   Platelets 416 (H) 150 - 379 x10E3/uL   Neutrophils 64 %   Lymphs 28 %   Monocytes 6 %   Eos 1 %   Basos 1 %   Neutrophils Absolute 6.2 1.4 - 7.0 x10E3/uL   Lymphocytes Absolute 2.7 0.7 - 3.1 x10E3/uL   Monocytes Absolute 0.6 0.1 - 0.9 x10E3/uL   EOS (ABSOLUTE) 0.1 0.0 - 0.4 x10E3/uL   Basophils Absolute 0.1 0.0 - 0.2 x10E3/uL   Immature Granulocytes 0 %   Immature Grans (Abs) 0.0 0.0 - 0.1 x10E3/uL  Lipid panel     Status: Abnormal   Collection Time: 07/28/15  9:53 AM  Result Value Ref Range   Cholesterol, Total 178 100 - 199 mg/dL   Triglycerides 76 0 - 149 mg/dL   HDL 48 >39 mg/dL    Comment: According to ATP-III Guidelines, HDL-C >59 mg/dL is considered a negative risk factor for CHD.    VLDL Cholesterol Cal 15 5 - 40 mg/dL   LDL Calculated 115 (H) 0 - 99 mg/dL   Chol/HDL Ratio 3.7 0.0 - 4.4 ratio units    Comment:                                   T. Chol/HDL Ratio                                             Men  Women                               1/2 Avg.Risk  3.4    3.3                                   Avg.Risk  5.0    4.4                                2X Avg.Risk  9.6    7.1  3X Avg.Risk 23.4   11.0      Assessment & Plan  1. Pruritic dermatitis Onset over the past 4-5 days. Appearance of bedbug bites. Given Triamcinolone cream for whelps and may use Zyrtec prn itch. Should follow detection and treatment of bedding instruction on hand out. Recheck prn. - triamcinolone cream (KENALOG) 0.1 %; Apply 1 application topically 3 (three) times daily.  Dispense: 30 g; Refill: 0  2. Type 2 diabetes mellitus with hyperosmolarity without coma, without long-term current use of insulin (HCC) Much improved control of diabetes. FBS ahd been 80-118 since the last office visit. Weight down 4 lbs and walking 10,000 steps daily for exercise. Continue  present medication regimen and diet. Recheck in 2 months as planned.

## 2015-09-04 NOTE — Patient Instructions (Signed)
Bedbugs Bedbugs are tiny bugs that live in and around beds. They stay hidden during the day, and they come out at night and bite. Bedbugs need blood to live and grow. WHERE ARE BEDBUGS FOUND? Bedbugs can be found anywhere, whether a place is clean or dirty. They are most often found in places where many people come and go, such as hotels, shelters, dorms, and health care settings. It is also common for them to be found in homes where there are many birds or bats nearby. WHAT ARE BEDBUG BITES LIKE? A bedbug bite leaves a small red bump with a darker red dot in the middle. The bump may appear soon after a person is bitten or a day or more later. Bedbug bites usually do not hurt, but they may itch. Most people do not need treatment for bedbug bites. The bumps usually go away on their own in a few days. HOW DO I CHECK FOR BEDBUGS? Bedbugs are reddish-brown, oval, and flat. They range in size from 1 mm to 7 mm and they cannot fly. Look for bedbugs in these places:  On mattresses, bed frames, headboards, and box springs.  On drapes and curtains in bedrooms.  Under carpeting in bedrooms.  Behind electrical outlets.  Behind any wallpaper that is peeling.  Inside luggage. Also look for black or red spots or stains on or near the bed. Stains can come from bedbugs that have been crushed or from bedbug waste. WHAT SHOULD I DO IF I FIND BEDBUGS? When Traveling If you find bedbugs while traveling, check all of your possessions carefully before you bring them into your home. Consider throwing away anything that has bedbugs on it. At Home If you find bedbugs at home, your bedroom may need to be treated by a pest control expert. You may also need to throw away mattresses or luggage. To help keep bedbugs from coming back, consider taking these actions:  Put a plastic cover over your mattress.  Wash your clothes and bedding in water that is hotter than 120F (48.9C) and dry them on a hot setting. Bedbugs  are killed by high temperatures.  Vacuum often around the bed and in all of the cracks and crevices where the bugs might hide.  Carefully check all used furniture, bedding, or clothes that you bring into your home.  Eliminate bird nests and bat roosts that are near your home. In Your Bed If you find bedbugs in your bed, consider wearing pajamas that have long sleeves and pant legs. Bedbugs usually bite areas of the skin that are not covered.   This information is not intended to replace advice given to you by your health care provider. Make sure you discuss any questions you have with your health care provider.   Document Released: 11/06/2010 Document Revised: 02/18/2015 Document Reviewed: 09/30/2014 Elsevier Interactive Patient Education 2016 [employer]  

## 2015-09-26 ENCOUNTER — Encounter: Payer: Self-pay | Admitting: Physician Assistant

## 2015-09-26 ENCOUNTER — Ambulatory Visit (INDEPENDENT_AMBULATORY_CARE_PROVIDER_SITE_OTHER): Payer: BLUE CROSS/BLUE SHIELD | Admitting: Physician Assistant

## 2015-09-26 VITALS — BP 122/78 | HR 93 | Temp 98.6°F | Resp 16 | Wt 185.8 lb

## 2015-09-26 DIAGNOSIS — L298 Other pruritus: Secondary | ICD-10-CM

## 2015-09-26 DIAGNOSIS — F419 Anxiety disorder, unspecified: Secondary | ICD-10-CM

## 2015-09-26 MED ORDER — CEPHALEXIN 500 MG PO CAPS: 500.0000 mg | ORAL_CAPSULE | Freq: Two times a day (BID) | ORAL | Status: DC

## 2015-09-26 MED ORDER — ALPRAZOLAM 0.25 MG PO TABS: 0.2500 mg | ORAL_TABLET | Freq: Three times a day (TID) | ORAL | Status: DC | PRN

## 2015-09-26 MED ORDER — PERMETHRIN 5 % EX CREA: 1.0000 "application " | TOPICAL_CREAM | Freq: Once | CUTANEOUS | Status: DC

## 2015-09-26 NOTE — Progress Notes (Signed)
Patient: Shari Lee Female    DOB: [birth year]   59 y.o.   MRN: [id number] Visit Date: 09/26/2015  Today's Provider: Mar Daring, PA-C   Chief Complaint  Patient presents with  . Rash    on legs, back, and arms   Subjective:    Rash This is a recurrent problem. The current episode started 1 to 4 weeks ago. The problem is unchanged. The affected locations include the back, left arm and right lowerleg. The rash is characterized by itchiness. Associated with: Patient saw Simona Huh like 3-4 weeks ago. Saw the Dermatologist today and per the Dermatologists possible scabies. Patient pick up the Hydroxyzine but they didn't have the cream  that was prescribed. Patient called the Dermatologist office but it was closed. Associated symptoms include shortness of breath (feeling very anxious). Pertinent negatives include no cough or joint pain. Past treatments include anti-itch cream and antihistamine (Aleggra during the day and Benadryl).  She states that the dermatologist did a biopsy last week and when she went this morning she was told that it did look consistent with a bug bite but did not say which type of bug. She states it was not classic for bedbugs or for scabies. Since she had not responded to treatment for bedbug bites nor has the exterminator found any bedbugs, which he has been out twice to her house, the dermatologist felt that she would try a scabies treatment to see if that helped. There are 2 other people who also live in the house with her and they have not had any bug bites. She has been exposed to animals. She states that approximately 2 months ago she did take in a straight catheter for a very short time. She states the only time the cat was in the house was when she would come in to be fed. She also works part-time as a Art therapist but states that she has never had any issues with the dogs that she sits for nor have the families complained of having any bugs on their pets. She  is very anxious today and tearful when she speaks of this. She states that she is starting to become a recluse and is canceling appointments with family and friends because she is scared she is going to give them whenever she has. She also states that she is having nightmares about bedbugs.     Allergies  Allergen Reactions  . Amoxicillin-Pot Clavulanate   . Levsin  [Hyoscyamine]     Other reaction(s): Dizzyness  . Penicillins    Previous Medications   AMLODIPINE-BENAZEPRIL (LOTREL) 5-20 MG PER CAPSULE    Take 1 capsule by mouth daily.   ATENOLOL (TENORMIN) 50 MG TABLET    Take 1 tablet (50 mg total) by mouth daily.   GLUCOSE BLOOD (ONETOUCH VERIO) TEST STRIP    Use as instructed   GLUCOSE BLOOD TEST STRIP    Use as instructed. Test fasting blood sugar each morning.   GLYBURIDE (DIABETA) 2.5 MG TABLET    Take 1 tablet (2.5 mg total) by mouth 2 (two) times daily.   HYDROXYZINE (ATARAX/VISTARIL) 25 MG TABLET    Take 25 mg by mouth at bedtime as needed.   LANCETS (ONETOUCH ULTRASOFT) LANCETS    Use as instructed   TRIAMCINOLONE CREAM (KENALOG) 0.1 %    Apply 1 application topically 3 (three) times daily.    Review of Systems  Constitutional: Negative.   HENT: Negative.   Eyes: Negative.  Respiratory: Positive for shortness of breath (feeling very anxious). Negative for cough, chest tightness and wheezing.   Cardiovascular: Negative.   Gastrointestinal: Negative.   Endocrine: Negative.   Musculoskeletal: Negative.  Negative for joint pain.  Skin: Positive for rash.  Psychiatric/Behavioral: The patient is nervous/anxious.     Social History  Substance Use Topics  . Smoking status: Never Smoker   . Smokeless tobacco: Never Used  . Alcohol Use: No   Objective:   BP 122/78 mmHg  Pulse 93  Temp(Src) 98.6 F (37 C) (Oral)  Resp 16  Wt 185 lb 12.8 oz (84.278 kg)  Physical Exam  Constitutional: She appears well-developed and well-nourished. No distress.  Cardiovascular: Normal  rate, regular rhythm and normal heart sounds.  Exam reveals no gallop and no friction rub.   No murmur heard. Pulmonary/Chest: Effort normal and breath sounds normal. No respiratory distress. She has no wheezes. She has no rales.  Skin: Skin is warm and dry. Rash noted. Rash is urticarial. She is not diaphoretic.     Psychiatric: Her speech is normal and behavior is normal. Judgment and thought content normal. Her mood appears anxious. Cognition and memory are normal. She exhibits a depressed mood.  Vitals reviewed.       Assessment & Plan:     1. Pruritic erythematous rash Still unknown cause at this time. Her dermatologist told her that the biopsy came back consistent with a bug bite but did not say which type of bug. She states that the dermatologist does not feel that it was bedbugs nor have bedbugs been found in her home with being checked twice by an exterminator. The dermatologist is going to treat her for scabies even though it is not consistent with scabies rash. I have sent in the cream that I believe the dermatologist was trying to send in for her that did not get sent to [facility]. I will also check labs for the patient. Her and her daughter are concerned that this may be caused by other issues and are wanting to have her thyroid, kidneys, liver and Lyme disease antibodies be checked for possible other causes. I will check labs as below and follow-up with her pending these lab results. I will also send in Keflex as I do feel that the newest lesion that has arisen is developing some infection. She is to call the office if this area worsens. If not I will see her back in 1-2 weeks to see how she is doing and to check for any new rashes. She follows up with dermatology on 10/14/2015. - CBC with Differential - Comprehensive Metabolic Panel (CMET) - TSH - B. Burgdorfi Antibodies - permethrin (ACTICIN) 5 % cream; Apply 1 application topically once.  Dispense: 60 g; Refill: 0 - cephALEXin  (KEFLEX) 500 MG capsule; Take 1 capsule (500 mg total) by mouth 2 (two) times daily.  Dispense: 10 capsule; Refill: 0  2. Acute anxiety I will also give her Xanax as below for acute anxiety. I advised her to take 1 tablet at night before bedtime to see if this helps relax her so that she may sleep. She may also take 1-2 tablets during the day as needed if she develops a panic attack. She voiced understanding. I will see her back in 1-2 weeks to see how she is doing. To call the office if symptoms fail to improve or worsen. - ALPRAZolam (XANAX) 0.25 MG tablet; Take 1 tablet (0.25 mg total) by mouth 3 (three) times daily  as needed for anxiety.  Dispense: 30 tablet; Refill: 0       Mar Daring, PA-C  [facility]

## 2015-09-26 NOTE — Patient Instructions (Addendum)
Rash A rash is a change in the color or texture of the skin. There are many different types of rashes. You may have other problems that accompany your rash. CAUSES   Infections.  Allergic reactions. This can include allergies to pets or foods.  Certain medicines.  Exposure to certain chemicals, soaps, or cosmetics.  Heat.  Exposure to poisonous plants.  Tumors, both cancerous and noncancerous. SYMPTOMS   Redness.  Scaly skin.  Itchy skin.  Dry or cracked skin.  Bumps.  Blisters.  Pain. DIAGNOSIS  Your caregiver may do a physical exam to determine what type of rash you have. A skin sample (biopsy) may be taken and examined under a microscope. TREATMENT  Treatment depends on the type of rash you have. Your caregiver may prescribe certain medicines. For serious conditions, you may need to see a skin doctor (dermatologist). HOME CARE INSTRUCTIONS   Avoid the substance that caused your rash.  Do not scratch your rash. This can cause infection.  You may take cool baths to help stop itching.  Only take over-the-counter or prescription medicines as directed by your caregiver.  Keep all follow-up appointments as directed by your caregiver. SEEK IMMEDIATE MEDICAL CARE IF:  You have increasing pain, swelling, or redness.  You have a fever.  You have new or severe symptoms.  You have body aches, diarrhea, or vomiting.  Your rash is not better after 3 days. MAKE SURE YOU:  Understand these instructions.  Will watch your condition.  Will get help right away if you are not doing well or get worse.   This information is not intended to replace advice given to you by your health care provider. Make sure you discuss any questions you have with your health care provider.   Document Released: 09/24/2002 Document Revised: 10/25/2014 Document Reviewed: 02/19/2015 Elsevier Interactive Patient Education 2016 Elsevier Inc.  Alprazolam tablets What is this  medicine? ALPRAZOLAM (al PRAY zoe lam) is a benzodiazepine. It is used to treat anxiety and panic attacks. This medicine may be used for other purposes; ask your health care provider or pharmacist if you have questions. What should I tell my health care provider before I take this medicine? They need to know if you have any of these conditions: -an alcohol or drug abuse problem -bipolar disorder, depression, psychosis or other mental health conditions -glaucoma -kidney or liver disease -lung or breathing disease -myasthenia gravis -Parkinson's disease -porphyria -seizures or a history of seizures -suicidal thoughts -an unusual or allergic reaction to alprazolam, other benzodiazepines, foods, dyes, or preservatives -pregnant or trying to get pregnant -breast-feeding How should I use this medicine? Take this medicine by mouth with a glass of water. Follow the directions on the prescription label. Take your medicine at regular intervals. Do not take it more often than directed. If you have been taking this medicine regularly for some time, do not suddenly stop taking it. You must gradually reduce the dose or you may get severe side effects. Ask your doctor or health care professional for advice. Even after you stop taking this medicine it can still affect your body for several days. Talk to your pediatrician regarding the use of this medicine in children. Special care may be needed. Overdosage: If you think you have taken too much of this medicine contact a poison control center or emergency room at once. NOTE: This medicine is only for you. Do not share this medicine with others. What if I miss a dose? If you miss a  dose, take it as soon as you can. If it is almost time for your next dose, take only that dose. Do not take double or extra doses. What may interact with this medicine? Do not take this medicine with any of the following medications: -certain medicines for HIV infection or  AIDS -ketoconazole -itraconazole This medicine may also interact with the following medications: -birth control pills -certain macrolide antibiotics like clarithromycin, erythromycin, troleandomycin -cimetidine -cyclosporine -ergotamine -grapefruit juice -herbal or dietary supplements like kava kava, melatonin, dehydroepiandrosterone, DHEA, St. John's Wort or valerian -imatinib, STI-571 -isoniazid -levodopa -medicines for depression, anxiety, or psychotic disturbances -prescription pain medicines -rifampin, rifapentine, or rifabutin -some medicines for blood pressure or heart problems -some medicines for seizures like carbamazepine, oxcarbazepine, phenobarbital, phenytoin, primidone This list may not describe all possible interactions. Give your health care provider a list of all the medicines, herbs, non-prescription drugs, or dietary supplements you use. Also tell them if you smoke, drink alcohol, or use illegal drugs. Some items may interact with your medicine. What should I watch for while using this medicine? Visit your doctor or health care professional for regular checks on your progress. Your body can become dependent on this medicine. Ask your doctor or health care professional if you still need to take it. You may get drowsy or dizzy. Do not drive, use machinery, or do anything that needs mental alertness until you know how this medicine affects you. To reduce the risk of dizzy and fainting spells, do not stand or sit up quickly, especially if you are an older patient. Alcohol may increase dizziness and drowsiness. Avoid alcoholic drinks. Do not treat yourself for coughs, colds or allergies without asking your doctor or health care professional for advice. Some ingredients can increase possible side effects. What side effects may I notice from receiving this medicine? Side effects that you should report to your doctor or health care professional as soon as possible: -allergic  reactions like skin rash, itching or hives, swelling of the face, lips, or tongue -confusion, forgetfulness -depression -difficulty sleeping -difficulty speaking -feeling faint or lightheaded, falls -mood changes, excitability or aggressive behavior -muscle cramps -trouble passing urine or change in the amount of urine -unusually weak or tired Side effects that usually do not require medical attention (report to your doctor or health care professional if they continue or are bothersome): -change in sex drive or performance -changes in appetite This list may not describe all possible side effects. Call your doctor for medical advice about side effects. You may report side effects to FDA at 1-800-FDA-1088. Where should I keep my medicine? Keep out of the reach of children. This medicine can be abused. Keep your medicine in a safe place to protect it from theft. Do not share this medicine with anyone. Selling or giving away this medicine is dangerous and against the law. Store at room temperature between 20 and 25 degrees C (68 and 77 degrees F). This medicine may cause accidental overdose and death if taken by other adults, children, or pets. Mix any unused medicine with a substance like cat litter or coffee grounds. Then throw the medicine away in a sealed container like a sealed bag or a coffee can with a lid. Do not use the medicine after the expiration date. NOTE: This sheet is a summary. It may not cover all possible information. If you have questions about this medicine, talk to your doctor, pharmacist, or health care provider.    2016, Elsevier/Gold Standard. (2014-06-25 14:51:36)

## 2015-09-29 LAB — CBC WITH DIFFERENTIAL/PLATELET
Basophils Absolute: 0 x10E3/uL (ref 0.0–0.2)
Basos: 0 %
EOS (ABSOLUTE): 0.1 x10E3/uL (ref 0.0–0.4)
Eos: 1 %
Hematocrit: 41.8 % (ref 34.0–46.6)
Hemoglobin: 13.4 g/dL (ref 11.1–15.9)
Immature Grans (Abs): 0 x10E3/uL (ref 0.0–0.1)
Immature Granulocytes: 0 %
Lymphocytes Absolute: 3 x10E3/uL (ref 0.7–3.1)
Lymphs: 26 %
MCH: 27.7 pg (ref 26.6–33.0)
MCHC: 32.1 g/dL (ref 31.5–35.7)
MCV: 87 fL (ref 79–97)
Monocytes Absolute: 0.8 x10E3/uL (ref 0.1–0.9)
Monocytes: 7 %
Neutrophils Absolute: 7.3 x10E3/uL — ABNORMAL HIGH (ref 1.4–7.0)
Neutrophils: 66 %
Platelets: 390 x10E3/uL — ABNORMAL HIGH (ref 150–379)
RBC: 4.83 x10E6/uL (ref 3.77–5.28)
RDW: 14.9 % (ref 12.3–15.4)
WBC: 11.3 x10E3/uL — ABNORMAL HIGH (ref 3.4–10.8)

## 2015-09-29 LAB — TSH: TSH: 1.55 u[IU]/mL (ref 0.450–4.500)

## 2015-09-29 LAB — COMPREHENSIVE METABOLIC PANEL WITH GFR
ALT: 23 IU/L (ref 0–32)
AST: 18 IU/L (ref 0–40)
Albumin/Globulin Ratio: 1.8 (ref 1.1–2.5)
Albumin: 4.6 g/dL (ref 3.5–5.5)
Alkaline Phosphatase: 75 IU/L (ref 39–117)
BUN/Creatinine Ratio: 20 (ref 9–23)
BUN: 15 mg/dL (ref 6–24)
Bilirubin Total: 0.3 mg/dL (ref 0.0–1.2)
CO2: 25 mmol/L (ref 18–29)
Calcium: 10.4 mg/dL — ABNORMAL HIGH (ref 8.7–10.2)
Chloride: 99 mmol/L (ref 97–106)
Creatinine, Ser: 0.74 mg/dL (ref 0.57–1.00)
GFR calc Af Amer: 103 mL/min/1.73
GFR calc non Af Amer: 90 mL/min/1.73
Globulin, Total: 2.6 g/dL (ref 1.5–4.5)
Glucose: 123 mg/dL — ABNORMAL HIGH (ref 65–99)
Potassium: 5.1 mmol/L (ref 3.5–5.2)
Sodium: 142 mmol/L (ref 136–144)
Total Protein: 7.2 g/dL (ref 6.0–8.5)

## 2015-09-29 LAB — B. BURGDORFI ANTIBODIES

## 2015-10-06 ENCOUNTER — Encounter: Payer: Self-pay | Admitting: Physician Assistant

## 2015-10-06 ENCOUNTER — Ambulatory Visit (INDEPENDENT_AMBULATORY_CARE_PROVIDER_SITE_OTHER): Payer: BLUE CROSS/BLUE SHIELD | Admitting: Physician Assistant

## 2015-10-06 VITALS — BP 109/68 | HR 75 | Temp 98.3°F | Resp 16 | Wt 186.8 lb

## 2015-10-06 DIAGNOSIS — D72829 Elevated white blood cell count, unspecified: Secondary | ICD-10-CM | POA: Diagnosis not present

## 2015-10-06 DIAGNOSIS — L282 Other prurigo: Secondary | ICD-10-CM

## 2015-10-06 NOTE — Progress Notes (Signed)
Patient: Shari Lee Female    DOB: July 15, 1956   59 y.o.   MRN: [id number] Visit Date: 10/06/2015  Today's Provider: Mar Daring, PA-C   Chief Complaint  Patient presents with  . Follow-up    Rash   Subjective:    HPI Patient is here for her 1 week follow-up rash. Per patient the rash was responding good with the medication until yesterday. Patient noticed new rash on her right arm. Patient feels better overall. No more nightmares. Patient goes the 15 of this month to see the Dermatologist.      Allergies  Allergen Reactions  . Amoxicillin-Pot Clavulanate   . Levsin  [Hyoscyamine]     Other reaction(s): Dizzyness  . Penicillins    Previous Medications   ALPRAZOLAM (XANAX) 0.25 MG TABLET    Take 1 tablet (0.25 mg total) by mouth 3 (three) times daily as needed for anxiety.   AMLODIPINE-BENAZEPRIL (LOTREL) 5-20 MG PER CAPSULE    Take 1 capsule by mouth daily.   ATENOLOL (TENORMIN) 50 MG TABLET    Take 1 tablet (50 mg total) by mouth daily.   CEPHALEXIN (KEFLEX) 500 MG CAPSULE    Take 1 capsule (500 mg total) by mouth 2 (two) times daily.   GLUCOSE BLOOD (ONETOUCH VERIO) TEST STRIP    Use as instructed   GLUCOSE BLOOD TEST STRIP    Use as instructed. Test fasting blood sugar each morning.   GLYBURIDE (DIABETA) 2.5 MG TABLET    Take 1 tablet (2.5 mg total) by mouth 2 (two) times daily.   HYDROXYZINE (ATARAX/VISTARIL) 25 MG TABLET    Take 25 mg by mouth at bedtime as needed.   LANCETS (ONETOUCH ULTRASOFT) LANCETS    Use as instructed   PERMETHRIN (ACTICIN) 5 % CREAM    Apply 1 application topically once.   TRIAMCINOLONE CREAM (KENALOG) 0.1 %    Apply 1 application topically 3 (three) times daily.    Review of Systems  Constitutional: Negative.   HENT: Negative.   Respiratory: Negative.   Cardiovascular: Negative.   Gastrointestinal: Negative.   Musculoskeletal: Negative.   Skin: Positive for rash.  All other systems reviewed and are negative.   Social  History  Substance Use Topics  . Smoking status: Never Smoker   . Smokeless tobacco: Never Used  . Alcohol Use: No   Objective:   BP 109/68 mmHg  Pulse 75  Temp(Src) 98.3 F (36.8 C) (Oral)  Resp 16  Wt 186 lb 12.8 oz (84.732 kg)  Physical Exam  Constitutional: She appears well-developed and well-nourished. No distress.  Skin: Skin is warm and dry. Rash noted. She is not diaphoretic. No erythema. No pallor.     Psychiatric: She has a normal mood and affect. Her behavior is normal. Judgment and thought content normal.  Vitals reviewed.       Assessment & Plan:     1. Pruritic rash She does have one new area of the eruption over the last 10 days. She states it is pruritic. She has been using the cream that was given to her by dermatology but states it is not helping very much. She did also find a bug in her bathroom last night. She has saved it for the exterminator. He is to come out today to see what the bug is and to inspect the house for others. This may be the source of the bug bites that she is continually getting. She will call the  office once the exterminator comes to let us know what the bug is and if any more workup found. At this time she is to continue her current treatment. She is also to keep her appointment with dermatology which is next week on the 27th. Her mood and anxiety is much better today. She does state that however yesterday she did have to take 2 Xanax. They have been helping with her anxiety. I will follow-up with her as needed.  She is to call the office if she has any worsening symptoms.  2. Elevated white blood cell count I will recheck her CBC to make sure that her white blood cell count has decreased. I will follow-up with her pending these results. - CBC with Differential       Mar Daring, PA-C  [facility]

## 2015-10-06 NOTE — Patient Instructions (Signed)

## 2015-10-07 ENCOUNTER — Telehealth: Payer: Self-pay

## 2015-10-07 LAB — CBC WITH DIFFERENTIAL/PLATELET
BASOS ABS: 0 10*3/uL (ref 0.0–0.2)
Basos: 0 %
EOS (ABSOLUTE): 0.2 10*3/uL (ref 0.0–0.4)
Eos: 2 %
HEMOGLOBIN: 13.1 g/dL (ref 11.1–15.9)
Hematocrit: 39.7 % (ref 34.0–46.6)
IMMATURE GRANULOCYTES: 0 %
Immature Grans (Abs): 0 10*3/uL (ref 0.0–0.1)
LYMPHS ABS: 2.2 10*3/uL (ref 0.7–3.1)
LYMPHS: 24 %
MCH: 28.4 pg (ref 26.6–33.0)
MCHC: 33 g/dL (ref 31.5–35.7)
MCV: 86 fL (ref 79–97)
MONOCYTES: 7 %
Monocytes Absolute: 0.6 10*3/uL (ref 0.1–0.9)
NEUTROS PCT: 67 %
Neutrophils Absolute: 6 10*3/uL (ref 1.4–7.0)
PLATELETS: 386 10*3/uL — AB (ref 150–379)
RBC: 4.62 x10E6/uL (ref 3.77–5.28)
RDW: 15.3 % (ref 12.3–15.4)
WBC: 9.1 10*3/uL (ref 3.4–10.8)

## 2015-10-07 NOTE — Telephone Encounter (Signed)
-----   Message from Mar Daring, PA-C sent at 10/07/2015  8:20 AM EST ----- WBC count has normalized.

## 2015-10-07 NOTE — Telephone Encounter (Signed)
Patient advised as directed below. Per patient wants Tawanna Sat to know that the exterminator came to her house and said there was no bed bugs.  Thanks,  -Sativa Gelles

## 2015-10-30 ENCOUNTER — Ambulatory Visit: Payer: BLUE CROSS/BLUE SHIELD | Admitting: Family Medicine

## 2016-04-12 ENCOUNTER — Other Ambulatory Visit: Payer: Self-pay | Admitting: Family Medicine

## 2016-05-20 ENCOUNTER — Other Ambulatory Visit: Payer: Self-pay | Admitting: Family Medicine

## 2016-05-20 DIAGNOSIS — I10 Essential (primary) hypertension: Secondary | ICD-10-CM

## 2016-07-15 ENCOUNTER — Encounter: Payer: Self-pay | Admitting: Physician Assistant

## 2016-07-15 ENCOUNTER — Ambulatory Visit (INDEPENDENT_AMBULATORY_CARE_PROVIDER_SITE_OTHER): Payer: BLUE CROSS/BLUE SHIELD | Admitting: Physician Assistant

## 2016-07-15 ENCOUNTER — Other Ambulatory Visit: Payer: Self-pay

## 2016-07-15 ENCOUNTER — Telehealth: Payer: Self-pay

## 2016-07-15 VITALS — BP 146/88 | HR 60 | Temp 98.2°F | Resp 16 | Ht 62.0 in | Wt 208.0 lb

## 2016-07-15 DIAGNOSIS — I10 Essential (primary) hypertension: Secondary | ICD-10-CM | POA: Diagnosis not present

## 2016-07-15 DIAGNOSIS — Z0000 Encounter for general adult medical examination without abnormal findings: Secondary | ICD-10-CM | POA: Diagnosis not present

## 2016-07-15 DIAGNOSIS — Z1272 Encounter for screening for malignant neoplasm of vagina: Secondary | ICD-10-CM

## 2016-07-15 DIAGNOSIS — Z1159 Encounter for screening for other viral diseases: Secondary | ICD-10-CM | POA: Diagnosis not present

## 2016-07-15 DIAGNOSIS — Z1211 Encounter for screening for malignant neoplasm of colon: Secondary | ICD-10-CM | POA: Diagnosis not present

## 2016-07-15 DIAGNOSIS — E7800 Pure hypercholesterolemia, unspecified: Secondary | ICD-10-CM | POA: Diagnosis not present

## 2016-07-15 DIAGNOSIS — Z1239 Encounter for other screening for malignant neoplasm of breast: Secondary | ICD-10-CM | POA: Diagnosis not present

## 2016-07-15 DIAGNOSIS — E1100 Type 2 diabetes mellitus with hyperosmolarity without nonketotic hyperglycemic-hyperosmolar coma (NKHHC): Secondary | ICD-10-CM

## 2016-07-15 NOTE — Telephone Encounter (Signed)
Screening Colonoscopy Z12.11 [facility] [id number] [employer]  Pre cert is not required

## 2016-07-15 NOTE — Telephone Encounter (Signed)
Gastroenterology Pre-Procedure Review  Request Date: 08/13/2016 Requesting Physician: Dr. Marlyn Corporal  PATIENT REVIEW QUESTIONS: The patient responded to the following health history questions as indicated:    1. Are you having any GI issues? no 2. Do you have a personal history of Polyps? no 3. Do you have a family history of Colon Cancer or Polyps? yes (Maternal aunt colon cancer) 4. Diabetes Mellitus? yes (Type 2) 5. Joint replacements in the past 12 months?no 6. Major health problems in the past 3 months?no 7. Any artificial heart valves, MVP, or defibrillator?no    MEDICATIONS & ALLERGIES:    Patient reports the following regarding taking any anticoagulation/antiplatelet therapy:   Plavix, Coumadin, Eliquis, Xarelto, Lovenox, Pradaxa, Brilinta, or Effient? no Aspirin? no  Patient confirms/reports the following medications:  Current Outpatient Prescriptions  Medication Sig Dispense Refill  . amLODipine-benazepril (LOTREL) 5-20 MG capsule TAKE ONE CAPSULE BY MOUTH ONCE DAILY 90 capsule 0  . atenolol (TENORMIN) 50 MG tablet TAKE ONE TABLET BY MOUTH ONCE DAILY 90 tablet 0  . glucose blood (ONETOUCH VERIO) test strip Use as instructed 100 each 12  . glucose blood test strip Use as instructed. Test fasting blood sugar each morning. 100 each 12  . glyBURIDE (DIABETA) 2.5 MG tablet Take 1 tablet (2.5 mg total) by mouth 2 (two) times daily. 180 tablet 2  . Lancets (ONETOUCH ULTRASOFT) lancets Use as instructed 100 each 12   No current facility-administered medications for this visit.     Patient confirms/reports the following allergies:  Allergies  Allergen Reactions  . Amoxicillin-Pot Clavulanate   . Levsin  [Hyoscyamine]     Other reaction(s): Dizzyness  . Penicillins     No orders of the defined types were placed in this encounter.   AUTHORIZATION INFORMATION Primary Insurance: 1D#: Group #:  Secondary Insurance: 1D#: Group #:  SCHEDULE INFORMATION: Date:  08/13/2016 Time: Location: [facility]

## 2016-07-15 NOTE — Patient Instructions (Signed)

## 2016-07-15 NOTE — Progress Notes (Signed)
Patient: Shari Lee, Female    DOB: 12/23/1955, 60 y.o.   MRN: [id number] Visit Date: 07/15/2016  Today's Provider: Mar Daring, PA-C   Chief Complaint  Patient presents with  . Annual Exam   Subjective:    Annual physical exam Demi Dreese is a 60 y.o. female who presents today for health maintenance and complete physical. She feels well. She reports exercising none. She reports she is sleeping fairly well. Patient declines vaccine.  -----------------------------------------------------------------   Diabetes Mellitus Type II, Follow-up:   Lab Results  Component Value Date   HGBA1C 11.3 (H) 07/28/2015   HGBA1C 13.1 (H) 05/05/2015    Last seen for diabetes 10  months ago.  Management since then includes no changes made. She reports excellent compliance with treatment. She is not having side effects.  Current symptoms include none and have been stable. Home blood sugar records: not being checked  Episodes of hypoglycemia? no   Current Insulin Regimen:  Most Recent Eye Exam: up to date Weight trend: stable Prior visit with dietician: no Current diet: in general, a "healthy" diet   Current exercise: none  Pertinent Labs:    Component Value Date/Time   CHOL 178 07/28/2015 0953   TRIG 76 07/28/2015 0953   HDL 48 07/28/2015 0953   LDLCALC 115 (H) 07/28/2015 0953   CREATININE 0.74 09/26/2015 1455    Wt Readings from Last 3 Encounters:  07/15/16 208 lb (94.3 kg)  10/06/15 186 lb 12.8 oz (84.7 kg)  09/26/15 185 lb 12.8 oz (84.3 kg)   ------------------------------------------------------------------------    Review of Systems  Constitutional: Negative.   Eyes: Positive for pain and itching.  Respiratory: Negative.   Cardiovascular: Negative.   Gastrointestinal: Negative.   Endocrine: Negative.   Genitourinary: Negative.   Musculoskeletal: Negative.   Skin: Negative.   Allergic/Immunologic: Negative.   Neurological: Positive for  headaches. Negative for dizziness, syncope, weakness and numbness.  Hematological: Negative.   Psychiatric/Behavioral: Negative.     Social History      She  reports that she has never smoked. She has never used smokeless tobacco. She reports that she does not drink alcohol or use drugs.       Social History   Social History  . Marital status: Divorced    Spouse name: N/A  . Number of children: N/A  . Years of education: N/A   Social History Main Topics  . Smoking status: Never Smoker  . Smokeless tobacco: Never Used  . Alcohol use No  . Drug use: No  . Sexual activity: Not Asked   Other Topics Concern  . None   Social History Narrative  . None    History reviewed. No pertinent past medical history.   Patient Active Problem List   Diagnosis Date Noted  . Type 2 diabetes mellitus ([place]) 07/28/2015  . 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 Diabetes mellitus type 2, uncontrolled ([place]) 04/29/2015  . History of digestive disease 04/29/2015  . 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 Hypercholesterolemia without hypertriglyceridemia 12/11/2009  . Apnea, sleep 07/30/2008  . Colon, diverticulosis 06/07/2008  . Essential (primary) hypertension 04/25/2006    Past Surgical History:  Procedure Laterality Date  . ABDOMINAL HYSTERECTOMY  2006    Family History        Family Status  Relation Status  . Mother Deceased  . Father Deceased  . Sister Alive  . Brother Alive  . Daughter Alive  . Brother Alive  .  Brother Alive  . Sister Alive        Her family history includes Breast cancer in her sister; Diabetes in her mother; Diverticulitis in her brother and sister; Heart attack in her mother; Heart disease in her mother; Heart failure in her father; Sleep apnea in her brother.    Allergies  Allergen Reactions  . Amoxicillin-Pot Clavulanate   . Levsin  [Hyoscyamine]     Other reaction(s): Dizzyness  . Penicillins     Current Meds  Medication Sig  .  amLODipine-benazepril (LOTREL) 5-20 MG capsule TAKE ONE CAPSULE BY MOUTH ONCE DAILY  . atenolol (TENORMIN) 50 MG tablet TAKE ONE TABLET BY MOUTH ONCE DAILY  . glucose blood (ONETOUCH VERIO) test strip Use as instructed  . glucose blood test strip Use as instructed. Test fasting blood sugar each morning.  . glyBURIDE (DIABETA) 2.5 MG tablet Take 1 tablet (2.5 mg total) by mouth 2 (two) times daily.  . Lancets (ONETOUCH ULTRASOFT) lancets Use as instructed    Patient Care Team: Margo Common, PA as PCP - General (Physician Assistant)     Objective:   Vitals: BP (!) 146/88 (BP Location: Left Arm, Patient Position: Sitting, Cuff Size: Large)   Pulse 60   Temp 98.2 F (36.8 C) (Oral)   Resp 16   Ht 5\' 2"  (1.575 m)   Wt 208 lb (94.3 kg)   BMI 38.04 kg/m    Physical Exam  Constitutional: She is oriented to person, place, and time. She appears well-developed and well-nourished. No distress.  HENT:  Head: Normocephalic and atraumatic.  Right Ear: Hearing, tympanic membrane, external ear and ear canal normal.  Left Ear: Hearing, tympanic membrane, external ear and ear canal normal.  Nose: Nose normal.  Mouth/Throat: Uvula is midline, oropharynx is clear and moist and mucous membranes are normal. No oropharyngeal exudate.  Eyes: Conjunctivae and EOM are normal. Pupils are equal, round, and reactive to light. Right eye exhibits no discharge. Left eye exhibits no discharge. No scleral icterus.  Neck: Normal range of motion. Neck supple. No JVD present. Carotid bruit is not present. No tracheal deviation present. No thyromegaly present.  Cardiovascular: Normal rate, regular rhythm, normal heart sounds and intact distal pulses.  Exam reveals no gallop and no friction rub.   No murmur heard. Pulmonary/Chest: Effort normal and breath sounds normal. No respiratory distress. She has no wheezes. She has no rales. She exhibits no tenderness. Right breast exhibits no inverted nipple, no mass, no  nipple discharge, no skin change and no tenderness. Left breast exhibits no inverted nipple, no mass, no nipple discharge, no skin change and no tenderness. Breasts are symmetrical.  Abdominal: Soft. Bowel sounds are normal. She exhibits no distension and no mass. There is no tenderness. There is no rebound and no guarding. Hernia confirmed negative in the right inguinal area and confirmed negative in the left inguinal area.  Genitourinary: Rectum normal, vagina normal and uterus normal. No breast swelling, tenderness, discharge or bleeding. Pelvic exam was performed with patient supine. There is no rash, tenderness, lesion or injury on the right labia. There is no rash, tenderness, lesion or injury on the left labia. Cervix exhibits no motion tenderness, no discharge and no friability. Right adnexum displays no mass, no tenderness and no fullness. Left adnexum displays no mass, no tenderness and no fullness. No erythema, tenderness or bleeding in the vagina. No signs of injury around the vagina. No vaginal discharge found.  Genitourinary Comments: DRE deferred due to  patient getting colonoscopy this year  Musculoskeletal: Normal range of motion. She exhibits no edema or tenderness.  Lymphadenopathy:    She has no cervical adenopathy.       Right: No inguinal adenopathy present.       Left: No inguinal adenopathy present.  Neurological: She is alert and oriented to person, place, and time. She has normal reflexes. No cranial nerve deficit. Coordination normal.  Skin: Skin is warm and dry. No rash noted. She is not diaphoretic.  Psychiatric: She has a normal mood and affect. Her behavior is normal. Judgment and thought content normal.  Vitals reviewed.  Diabetic Foot Form - Detailed   Diabetic Foot Exam - detailed Diabetic Foot exam was performed with the following findings:  Yes 07/15/2016 10:30 AM  Visual Foot Exam completed.:  Yes  Is there a history of foot ulcer?:  No Can the patient see the  bottom of their feet?:  Yes Are the shoes appropriate in style and fit?:  Yes Is there swelling or and abnormal foot shape?:  Yes (Comment: pes planus) Are the toenails long?:  No Are the toenails thick?:  No Do you have pain in calf while walking?:  No Is there a claw toe deformity?:  No Is there elevated skin temparature?:  No Is there limited skin dorsiflexion?:  No Is there foot or ankle muscle weakness?:  No Are the toenails ingrown?:  No Normal Range of Motion:  Yes Pulse Foot Exam completed.:  Yes  Right posterior Tibialias:  Present Left posterior Tibialias:  Present  Right Dorsalis Pedis:  Present Left Dorsalis Pedis:  Present  Sensory Foot Exam Completed.:  Yes Swelling:  No Semmes-Weinstein Monofilament Test R Foot Test Control:  Neg L Foot Test Control:  Neg  R Site 1-Great Toe:  Neg L Site 1-Great Toe:  Neg  R Site 4:  Neg L Site 4:  Neg  R Site 5:  Neg L Site 5:  Neg    Comments:  Normal exam       Depression Screen PHQ 2/9 Scores 07/15/2016  PHQ - 2 Score 0      Assessment & Plan:     Routine Health Maintenance and Physical Exam  Exercise Activities and Dietary recommendations Goals    None      Immunization History  Administered Date(s) Administered  . Influenza,inj,Quad PF,36+ Mos 07/28/2015    Health Maintenance  Topic Date Due  . Hepatitis C Screening  Jul 19, 1956  . PNEUMOCOCCAL POLYSACCHARIDE VACCINE (1) 12/28/1958  . FOOT EXAM  12/28/1966  . OPHTHALMOLOGY EXAM  12/28/1966  . HIV Screening  12/28/1971  . TETANUS/TDAP  12/28/1975  . PAP SMEAR  12/27/1977  . MAMMOGRAM  12/28/2006  . COLONOSCOPY  12/28/2006  . HEMOGLOBIN A1C  01/26/2016  . INFLUENZA VACCINE  05/18/2016      Discussed health benefits of physical activity, and encouraged her to engage in regular exercise appropriate for her age and condition.    1. Annual physical exam Normal physical exam today. Will check labs as below and f/u pending lab results. If labs are  stable and WNL she will not need to have these rechecked for one year at her next annual physical exam. She is to call the office in the meantime if she has any acute issue, questions or concerns.  2. Breast cancer screening Breast exam today was normal. There is family history of breast cancer in her sister. She does perform regular self breast exams. Mammogram was  ordered as below. Information for [facility] Breast clinic was given to patient so she may schedule her mammogram at her convenience. - MM Digital Screening; Future  3. Type 2 diabetes mellitus with hyperosmolarity without coma, without long-term current use of insulin (HCC) Uncontrolled. Continue glyburide. Will check labs as below and f/u pending results. - Hemoglobin A1c  4. Essential (primary) hypertension Stable. Patient did not take medications this morning due to fasting. Continue lotrel 5-20mg  and atenolol 50mg . Will check labs as below and f/u pending results. - CBC with Differential/Platelet - Comprehensive metabolic panel  5. Hypercholesterolemia without hypertriglyceridemia Diet controlled. Will check labs as below and f/u pending results. - Lipid Panel With LDL/HDL Ratio - TSH  6. Colon cancer screening - Ambulatory referral to Gastroenterology  7. Screening for vaginal cancer Pap collected today. Will send as below and f/u pending results. - Pap IG and HPV (high risk) DNA detection  8. Need for hepatitis C screening test - Hepatitis C Antibody  --------------------------------------------------------------------    Mar Daring, PA-C  [facility]

## 2016-07-16 ENCOUNTER — Telehealth: Payer: Self-pay

## 2016-07-16 LAB — CBC WITH DIFFERENTIAL/PLATELET
Basophils Absolute: 0 10*3/uL (ref 0.0–0.2)
Basos: 0 %
EOS (ABSOLUTE): 0.1 10*3/uL (ref 0.0–0.4)
EOS: 1 %
HEMOGLOBIN: 13.4 g/dL (ref 11.1–15.9)
Hematocrit: 41.2 % (ref 34.0–46.6)
Immature Grans (Abs): 0 10*3/uL (ref 0.0–0.1)
Immature Granulocytes: 0 %
LYMPHS ABS: 2.9 10*3/uL (ref 0.7–3.1)
LYMPHS: 28 %
MCH: 27.8 pg (ref 26.6–33.0)
MCHC: 32.5 g/dL (ref 31.5–35.7)
MCV: 86 fL (ref 79–97)
MONOCYTES: 6 %
Monocytes Absolute: 0.6 10*3/uL (ref 0.1–0.9)
NEUTROS PCT: 65 %
Neutrophils Absolute: 6.6 10*3/uL (ref 1.4–7.0)
PLATELETS: 402 10*3/uL — AB (ref 150–379)
RBC: 4.82 x10E6/uL (ref 3.77–5.28)
RDW: 13.5 % (ref 12.3–15.4)
WBC: 10.3 10*3/uL (ref 3.4–10.8)

## 2016-07-16 LAB — COMPREHENSIVE METABOLIC PANEL
A/G RATIO: 1.6 (ref 1.2–2.2)
ALBUMIN: 4.6 g/dL (ref 3.5–5.5)
ALK PHOS: 87 IU/L (ref 39–117)
ALT: 20 IU/L (ref 0–32)
AST: 19 IU/L (ref 0–40)
BILIRUBIN TOTAL: 0.4 mg/dL (ref 0.0–1.2)
BUN / CREAT RATIO: 18 (ref 9–23)
BUN: 14 mg/dL (ref 6–24)
CHLORIDE: 98 mmol/L (ref 96–106)
CO2: 25 mmol/L (ref 18–29)
Calcium: 10.1 mg/dL (ref 8.7–10.2)
Creatinine, Ser: 0.76 mg/dL (ref 0.57–1.00)
GFR calc Af Amer: 99 mL/min/{1.73_m2} (ref >59)
GFR calc non Af Amer: 86 mL/min/{1.73_m2} (ref >59)
GLUCOSE: 320 mg/dL — AB (ref 65–99)
Globulin, Total: 2.8 g/dL (ref 1.5–4.5)
POTASSIUM: 4.9 mmol/L (ref 3.5–5.2)
Sodium: 140 mmol/L (ref 134–144)
Total Protein: 7.4 g/dL (ref 6.0–8.5)

## 2016-07-16 LAB — HEMOGLOBIN A1C
Est. average glucose Bld gHb Est-mCnc: 315 mg/dL
HEMOGLOBIN A1C: 12.6 % — AB (ref 4.8–5.6)

## 2016-07-16 LAB — HEPATITIS C ANTIBODY

## 2016-07-16 LAB — LIPID PANEL WITH LDL/HDL RATIO
CHOLESTEROL TOTAL: 182 mg/dL (ref 100–199)
HDL: 55 mg/dL (ref >39)
LDL CALC: 106 mg/dL — AB (ref 0–99)
LDL/HDL RATIO: 1.9 ratio (ref 0.0–3.2)
Triglycerides: 107 mg/dL (ref 0–149)
VLDL Cholesterol Cal: 21 mg/dL (ref 5–40)

## 2016-07-16 LAB — TSH: TSH: 2.75 u[IU]/mL (ref 0.450–4.500)

## 2016-07-16 MED ORDER — METFORMIN HCL 500 MG PO TABS: 500.0000 mg | ORAL_TABLET | Freq: Two times a day (BID) | ORAL | 0 refills | Status: DC

## 2016-07-16 NOTE — Telephone Encounter (Signed)
Pt reports she has tried taking Metformin twice and had to stop due to diarrhea. She also states that she is only taking glyburide once daily, and is prescribed as BID. Pt states she will start taking glyburide BID. Please advise. Renaldo Fiddler, CMA

## 2016-07-16 NOTE — Addendum Note (Signed)
Addended by: Mar Daring on: 07/16/2016 01:51 PM   Modules accepted: Orders

## 2016-07-16 NOTE — Telephone Encounter (Signed)
Ok thanks 

## 2016-07-16 NOTE — Telephone Encounter (Signed)
-----   Message from Mar Daring, [place] sent at 07/16/2016  1:50 PM EDT ----- All labs are within normal limits and stable with exception of hgba1c which has increased back up to 12.6. I think we should add metformin to glipizide. We can recheck in 3 months. Thanks! -JB

## 2016-07-19 ENCOUNTER — Telehealth: Payer: Self-pay

## 2016-07-19 ENCOUNTER — Ambulatory Visit
Admission: RE | Admit: 2016-07-19 | Discharge: 2016-07-19 | Disposition: A | Discharge status: Home / Self Care | Payer: BLUE CROSS/BLUE SHIELD | Source: Ambulatory Visit | Attending: Physician Assistant | Admitting: Physician Assistant

## 2016-07-19 DIAGNOSIS — Z1231 Encounter for screening mammogram for malignant neoplasm of breast: Secondary | ICD-10-CM | POA: Diagnosis present

## 2016-07-19 DIAGNOSIS — Z1239 Encounter for other screening for malignant neoplasm of breast: Secondary | ICD-10-CM

## 2016-07-19 NOTE — Telephone Encounter (Signed)
-----   Message from Mar Daring, [place] sent at 07/19/2016  2:13 PM EDT ----- Normal mammogram. Repeat screening in one year.

## 2016-07-19 NOTE — Telephone Encounter (Signed)
Pt advised.   Thanks,   -Laura  

## 2016-07-20 ENCOUNTER — Telehealth: Payer: Self-pay

## 2016-07-20 LAB — PAP IG AND HPV HIGH-RISK
HPV, high-risk: NEGATIVE
PAP Smear Comment: 0

## 2016-07-20 NOTE — Telephone Encounter (Signed)
-----   Message from Mar Daring, [place] sent at 07/20/2016 11:49 AM EDT ----- Pap is normal, HPV negative. Can repeat in 5 years.

## 2016-07-20 NOTE — Telephone Encounter (Signed)
Patient advised as directed below.  Thanks,  -Joseline 

## 2016-08-04 ENCOUNTER — Other Ambulatory Visit: Payer: Self-pay | Admitting: Family Medicine

## 2016-08-04 DIAGNOSIS — E1100 Type 2 diabetes mellitus with hyperosmolarity without nonketotic hyperglycemic-hyperosmolar coma (NKHHC): Secondary | ICD-10-CM

## 2016-08-04 DIAGNOSIS — I10 Essential (primary) hypertension: Secondary | ICD-10-CM

## 2016-08-11 NOTE — Discharge Instructions (Signed)

## 2016-08-13 ENCOUNTER — Encounter
Admission: RE | Disposition: A | Discharge status: Home / Self Care | Payer: Self-pay | Source: Ambulatory Visit | Attending: Gastroenterology

## 2016-08-13 ENCOUNTER — Ambulatory Visit: Payer: BLUE CROSS/BLUE SHIELD | Admitting: Anesthesiology

## 2016-08-13 ENCOUNTER — Ambulatory Visit
Admission: RE | Admit: 2016-08-13 | Discharge: 2016-08-13 | Disposition: A | Discharge status: Home / Self Care | Payer: BLUE CROSS/BLUE SHIELD | Source: Ambulatory Visit | Attending: Gastroenterology | Admitting: Gastroenterology

## 2016-08-13 DIAGNOSIS — E119 Type 2 diabetes mellitus without complications: Secondary | ICD-10-CM | POA: Insufficient documentation

## 2016-08-13 DIAGNOSIS — G4730 Sleep apnea, unspecified: Secondary | ICD-10-CM | POA: Insufficient documentation

## 2016-08-13 DIAGNOSIS — Z86010 Personal history of colonic polyps: Secondary | ICD-10-CM | POA: Diagnosis not present

## 2016-08-13 DIAGNOSIS — Z7984 Long term (current) use of oral hypoglycemic drugs: Secondary | ICD-10-CM | POA: Insufficient documentation

## 2016-08-13 DIAGNOSIS — Z79899 Other long term (current) drug therapy: Secondary | ICD-10-CM | POA: Diagnosis not present

## 2016-08-13 DIAGNOSIS — K5730 Diverticulosis of large intestine without perforation or abscess without bleeding: Secondary | ICD-10-CM | POA: Insufficient documentation

## 2016-08-13 DIAGNOSIS — K641 Second degree hemorrhoids: Secondary | ICD-10-CM | POA: Diagnosis not present

## 2016-08-13 DIAGNOSIS — D123 Benign neoplasm of transverse colon: Secondary | ICD-10-CM | POA: Insufficient documentation

## 2016-08-13 DIAGNOSIS — D120 Benign neoplasm of cecum: Secondary | ICD-10-CM | POA: Insufficient documentation

## 2016-08-13 DIAGNOSIS — I10 Essential (primary) hypertension: Secondary | ICD-10-CM | POA: Insufficient documentation

## 2016-08-13 DIAGNOSIS — Z1211 Encounter for screening for malignant neoplasm of colon: Secondary | ICD-10-CM | POA: Diagnosis not present

## 2016-08-13 HISTORY — PX: POLYPECTOMY: SHX5525

## 2016-08-13 HISTORY — DX: Essential (primary) hypertension: I10

## 2016-08-13 HISTORY — DX: Headache: R51

## 2016-08-13 HISTORY — DX: Headache, unspecified: R51.9

## 2016-08-13 HISTORY — DX: Type 2 diabetes mellitus without complications: E11.9

## 2016-08-13 HISTORY — PX: COLONOSCOPY WITH PROPOFOL: SHX5780

## 2016-08-13 LAB — GLUCOSE, CAPILLARY
Glucose-Capillary: 121 mg/dL — ABNORMAL HIGH (ref 65–99)
Glucose-Capillary: 101 mg/dL — ABNORMAL HIGH (ref 65–99)

## 2016-08-13 SURGERY — COLONOSCOPY WITH PROPOFOL
Anesthesia: Monitor Anesthesia Care | Wound class: Contaminated

## 2016-08-13 MED ORDER — SIMETHICONE 40 MG/0.6ML PO SUSP
ORAL | Status: DC | PRN
  Administered 2016-08-13: 11:00:00

## 2016-08-13 MED ORDER — OXYCODONE HCL 5 MG PO TABS: 5.0000 mg | ORAL_TABLET | Freq: Once | ORAL | Status: DC | PRN

## 2016-08-13 MED ORDER — LACTATED RINGERS IV SOLN
INTRAVENOUS | Status: DC
  Administered 2016-08-13: 10:00:00 via INTRAVENOUS

## 2016-08-13 MED ORDER — OXYCODONE HCL 5 MG/5ML PO SOLN: 5.0000 mg | Freq: Once | ORAL | Status: DC | PRN

## 2016-08-13 MED ORDER — ACETAMINOPHEN 160 MG/5ML PO SOLN: 325.0000 mg | ORAL | Status: DC | PRN

## 2016-08-13 MED ORDER — LACTATED RINGERS IV SOLN: 500.0000 mL | INTRAVENOUS | Status: DC

## 2016-08-13 MED ORDER — LIDOCAINE HCL (CARDIAC) 20 MG/ML IV SOLN
INTRAVENOUS | Status: DC | PRN
  Administered 2016-08-13: 50 mg via INTRAVENOUS

## 2016-08-13 MED ORDER — FENTANYL CITRATE (PF) 100 MCG/2ML IJ SOLN: 25.0000 ug | INTRAMUSCULAR | Status: DC | PRN

## 2016-08-13 MED ORDER — PROPOFOL 10 MG/ML IV BOLUS
INTRAVENOUS | Status: DC | PRN
  Administered 2016-08-13: 50 mg via INTRAVENOUS
  Administered 2016-08-13: 30 mg via INTRAVENOUS
  Administered 2016-08-13: 100 mg via INTRAVENOUS
  Administered 2016-08-13 (×3): 20 mg via INTRAVENOUS

## 2016-08-13 MED ORDER — DEXAMETHASONE SODIUM PHOSPHATE 4 MG/ML IJ SOLN: 8.0000 mg | Freq: Once | INTRAMUSCULAR | Status: DC | PRN

## 2016-08-13 MED ORDER — ACETAMINOPHEN 325 MG PO TABS: 325.0000 mg | ORAL_TABLET | ORAL | Status: DC | PRN

## 2016-08-13 SURGICAL SUPPLY — 23 items

## 2016-08-13 NOTE — Op Note (Signed)
[facility] Patient Name: Natalina Lesmeister Procedure Date: 08/13/2016 10:26 AM MRN: [id number] Account #: [id number] Date of Birth: 1956-01-21 Admit Type: Outpatient Age: 60 Room: [facility] OR ROOM 01 Gender: Female Note Status: Finalized Procedure:            Colonoscopy Indications:          High risk colon cancer surveillance: Personal history                        of colonic polyps Providers:            Lucilla Lame MD, MD Referring MD:         Vickki Muff. Chrismon, MD (Referring MD) Medicines:            Propofol per Anesthesia Complications:        No immediate complications. Procedure:            Pre-Anesthesia Assessment:                       - Prior to the procedure, a History and Physical was                        performed, and patient medications and allergies were                        reviewed. The patient's tolerance of previous                        anesthesia was also reviewed. The risks and benefits of                        the procedure and the sedation options and risks were                        discussed with the patient. All questions were                        answered, and informed consent was obtained. Prior                        Anticoagulants: The patient has taken no previous                        anticoagulant or antiplatelet agents. ASA Grade                        Assessment: II - A patient with mild systemic disease.                        After reviewing the risks and benefits, the patient was                        deemed in satisfactory condition to undergo the                        procedure.                       After obtaining informed consent, the colonoscope was  passed under direct vision. Throughout the procedure,                        the patient's blood pressure, pulse, and oxygen                        saturations were monitored continuously. The Olympus CF       H180AL colonoscope (S#: [id number]) was introduced through                        the anus and advanced to the the cecum, identified by                        appendiceal orifice and ileocecal valve. The                        colonoscopy was performed without difficulty. The                        patient tolerated the procedure well. The quality of                        the bowel preparation was excellent. Findings:      The perianal and digital rectal examinations were normal.      A 3 mm polyp was found in the cecum. The polyp was sessile. The polyp       was removed with a cold biopsy forceps. Resection and retrieval were       complete.      Two sessile polyps were found in the transverse colon. The polyps were 3       to 4 mm in size. These polyps were removed with a cold biopsy forceps.       Resection and retrieval were complete.      Multiple small-mouthed diverticula were found in the sigmoid colon.      Non-bleeding internal hemorrhoids were found during retroflexion. The       hemorrhoids were Grade II (internal hemorrhoids that prolapse but reduce       spontaneously). Impression:           - One 3 mm polyp in the cecum, removed with a cold                        biopsy forceps. Resected and retrieved.                       - Two 3 to 4 mm polyps in the transverse colon, removed                        with a cold biopsy forceps. Resected and retrieved.                       - Diverticulosis in the sigmoid colon.                       - Non-bleeding internal hemorrhoids. Recommendation:       - Discharge patient to home.                       - Resume previous diet.                       -  Continue present medications.                       - Await pathology results.                       - Repeat colonoscopy in 5 years for surveillance. Procedure Code(s):    --- Professional ---                       [id number], Colonoscopy, flexible; with biopsy, single or                         multiple Diagnosis Code(s):    --- Professional ---                       Z86.010, Personal history of colonic polyps                       D12.0, Benign neoplasm of cecum                       D12.3, Benign neoplasm of transverse colon (hepatic                        flexure or splenic flexure) CPT copyright 2016 American Medical Association. All rights reserved. The codes documented in this report are preliminary and upon coder review may  be revised to meet current compliance requirements. Lucilla Lame MD, MD 08/13/2016 10:55:08 AM This report has been signed electronically. Number of Addenda: 0 Note Initiated On: 08/13/2016 10:26 AM Scope Withdrawal Time: 0 hours 5 minutes 24 seconds  Total Procedure Duration: 0 hours 10 minutes 37 seconds       [facility]

## 2016-08-13 NOTE — Anesthesia Postprocedure Evaluation (Signed)
Anesthesia Post Note  Patient: Shari Lee  Procedure(s) Performed: Procedure(s) (LRB): COLONOSCOPY WITH PROPOFOL (N/A) POLYPECTOMY  Patient location during evaluation: PACU Anesthesia Type: MAC Level of consciousness: awake and alert Pain management: pain level controlled Vital Signs Assessment: post-procedure vital signs reviewed and stable Respiratory status: spontaneous breathing, nonlabored ventilation and respiratory function stable Cardiovascular status: stable and blood pressure returned to baseline Anesthetic complications: no    Tressy Kunzman D Llewellyn Schoenberger

## 2016-08-13 NOTE — Anesthesia Preprocedure Evaluation (Signed)
Anesthesia Evaluation  Patient identified by MRN, date of birth, ID band Patient awake    Reviewed: Allergy & Precautions, H&P , NPO status , Patient's Chart, lab work & pertinent test results, reviewed documented beta blocker date and time   Airway Mallampati: II  TM Distance: >3 FB Neck ROM: full    Dental no notable dental hx.    Pulmonary sleep apnea and Continuous Positive Airway Pressure Ventilation ,    Pulmonary exam normal breath sounds clear to auscultation       Cardiovascular Exercise Tolerance: Good hypertension,  Rhythm:regular Rate:Normal     Neuro/Psych  Headaches, negative psych ROS   GI/Hepatic negative GI ROS, Neg liver ROS,   Endo/Other  diabetes, Well Controlled, Type 2, Oral Hypoglycemic Agents  Renal/GU negative Renal ROS  negative genitourinary   Musculoskeletal   Abdominal   Peds  Hematology negative hematology ROS (+)   Anesthesia Other Findings   Reproductive/Obstetrics negative OB ROS                             Anesthesia Physical Anesthesia Plan  ASA: II  Anesthesia Plan: MAC   Post-op Pain Management:    Induction:   Airway Management Planned:   Additional Equipment:   Intra-op Plan:   Post-operative Plan:   Informed Consent: I have reviewed the patients History and Physical, chart, labs and discussed the procedure including the risks, benefits and alternatives for the proposed anesthesia with the patient or authorized representative who has indicated his/her understanding and acceptance.     Plan Discussed with: CRNA  Anesthesia Plan Comments:         Anesthesia Quick Evaluation

## 2016-08-13 NOTE — Transfer of Care (Signed)
Immediate Anesthesia Transfer of Care Note  Patient: Shari Lee  Procedure(s) Performed: Procedure(s): COLONOSCOPY WITH PROPOFOL (N/A) POLYPECTOMY  Patient Location: PACU  Anesthesia Type: MAC  Level of Consciousness: awake, alert  and patient cooperative  Airway and Oxygen Therapy: Patient Spontanous Breathing and Patient connected to supplemental oxygen  Post-op Assessment: Post-op Vital signs reviewed, Patient's Cardiovascular Status Stable, Respiratory Function Stable, Patent Airway and No signs of Nausea or vomiting  Post-op Vital Signs: Reviewed and stable  Complications: No apparent anesthesia complications

## 2016-08-13 NOTE — Anesthesia Procedure Notes (Signed)
Procedure Name: MAC Performed by: Shakura Cowing Pre-anesthesia Checklist: Patient identified, Emergency Drugs available, Suction available, Timeout performed and Patient being monitored Patient Re-evaluated:Patient Re-evaluated prior to inductionOxygen Delivery Method: Nasal cannula Placement Confirmation: positive ETCO2     

## 2016-08-13 NOTE — H&P (Signed)
  Lucilla Lame, MD [facility] [address], [place], [place] [address] Phone: [phone] Fax : [phone]  Primary Care Physician:  Vernie Murders, PA Primary Gastroenterologist:  Dr. Allen Norris  Pre-Procedure History & Physical: HPI:  Shari Lee is a 60 y.o. female is here for an colonoscopy.   Past Medical History:  Diagnosis Date  . Diabetes mellitus without complication ([place])   . Headache   . Hypertension     Past Surgical History:  Procedure Laterality Date  . ABDOMINAL HYSTERECTOMY  2006  . BREAST BIOPSY Left 2005   benign  . BREAST EXCISIONAL BIOPSY Left 2005?    -benign    Prior to Admission medications   Medication Sig Start Date End Date Taking? Authorizing Provider  amLODipine-benazepril (LOTREL) 5-20 MG capsule TAKE ONE CAPSULE BY MOUTH ONCE DAILY 08/04/16  Yes Dennis E Chrismon, PA  atenolol (TENORMIN) 50 MG tablet TAKE ONE TABLET BY MOUTH ONCE DAILY 08/04/16  Yes Vickki Muff Chrismon, PA  glucose blood (ONETOUCH VERIO) test strip Use as instructed 07/28/15  Yes Dennis E Chrismon, PA  glucose blood test strip Use as instructed. Test fasting blood sugar each morning. 07/28/15  Yes Dennis E Chrismon, PA  glyBURIDE (DIABETA) 2.5 MG tablet TAKE ONE TABLET BY MOUTH TWICE DAILY 08/04/16  Yes Vickki Muff Chrismon, PA  Lancets (ONETOUCH ULTRASOFT) lancets Use as instructed 07/28/15  Yes Vickki Muff Chrismon, PA    Allergies as of 07/15/2016 - Review Complete 07/15/2016  Allergen Reaction Noted  . Amoxicillin-pot clavulanate  04/29/2015  . Levsin  [hyoscyamine]  04/29/2015  . Penicillins  04/29/2015    Family History  Problem Relation Age of Onset  . Heart attack Mother   . Diabetes Mother   . Heart disease Mother   . Heart failure Father   . Breast cancer Sister   . Sleep apnea Brother   . Diverticulitis Brother   . Diverticulitis Sister     Social History   Social History  . Marital status: Divorced    Spouse name: N/A  . Number of children: N/A  .  Years of education: N/A   Occupational History  . Not on file.   Social History Main Topics  . Smoking status: Never Smoker  . Smokeless tobacco: Never Used  . Alcohol use No  . Drug use: No  . Sexual activity: Not on file   Other Topics Concern  . Not on file   Social History Narrative  . No narrative on file    Review of Systems: See HPI, otherwise negative ROS  Physical Exam: BP (!) 143/70   Pulse 63   Temp 98.4 F (36.9 C) (Temporal)   Resp 16   Ht 5\' 2"  (1.575 m)   Wt 197 lb 14.4 oz (89.8 kg)   SpO2 99%   BMI 36.20 kg/m  General:   Alert,  pleasant and cooperative in NAD Head:  Normocephalic and atraumatic. Neck:  Supple; no masses or thyromegaly. Lungs:  Clear throughout to auscultation.    Heart:  Regular rate and rhythm. Abdomen:  Soft, nontender and nondistended. Normal bowel sounds, without guarding, and without rebound.   Neurologic:  Alert and  oriented x4;  grossly normal neurologically.  Impression/Plan: Shari Lee is here for an colonoscopy to be performed for history of polyps  Risks, benefits, limitations, and alternatives regarding  colonoscopy have been reviewed with the patient.  Questions have been answered.  All parties agreeable.   Lucilla Lame, MD  08/13/2016, 9:49 AM

## 2016-08-16 ENCOUNTER — Encounter: Payer: Self-pay | Admitting: Gastroenterology

## 2016-08-17 ENCOUNTER — Encounter: Payer: Self-pay | Admitting: Gastroenterology

## 2016-10-15 ENCOUNTER — Ambulatory Visit (INDEPENDENT_AMBULATORY_CARE_PROVIDER_SITE_OTHER): Payer: BLUE CROSS/BLUE SHIELD | Admitting: Physician Assistant

## 2016-10-15 ENCOUNTER — Encounter: Payer: Self-pay | Admitting: Physician Assistant

## 2016-10-15 VITALS — BP 140/80 | HR 75 | Temp 97.9°F | Resp 16 | Wt 202.8 lb

## 2016-10-15 DIAGNOSIS — E1100 Type 2 diabetes mellitus with hyperosmolarity without nonketotic hyperglycemic-hyperosmolar coma (NKHHC): Secondary | ICD-10-CM | POA: Diagnosis not present

## 2016-10-15 DIAGNOSIS — E118 Type 2 diabetes mellitus with unspecified complications: Secondary | ICD-10-CM

## 2016-10-15 DIAGNOSIS — E7800 Pure hypercholesterolemia, unspecified: Secondary | ICD-10-CM

## 2016-10-15 DIAGNOSIS — I10 Essential (primary) hypertension: Secondary | ICD-10-CM

## 2016-10-15 DIAGNOSIS — E1165 Type 2 diabetes mellitus with hyperglycemia: Secondary | ICD-10-CM | POA: Diagnosis not present

## 2016-10-15 LAB — POCT GLYCOSYLATED HEMOGLOBIN (HGB A1C)
ESTIMATED AVERAGE GLUCOSE: 220
HEMOGLOBIN A1C: 9.3

## 2016-10-15 MED ORDER — AMLODIPINE BESY-BENAZEPRIL HCL 5-20 MG PO CAPS: 1.0000 | ORAL_CAPSULE | Freq: Every day | ORAL | 1 refills | Status: DC

## 2016-10-15 MED ORDER — ATENOLOL 50 MG PO TABS: 50.0000 mg | ORAL_TABLET | Freq: Every day | ORAL | 1 refills | Status: DC

## 2016-10-15 MED ORDER — GLYBURIDE 2.5 MG PO TABS: 2.5000 mg | ORAL_TABLET | Freq: Two times a day (BID) | ORAL | 1 refills | Status: DC

## 2016-10-15 MED ORDER — GLUCOSE BLOOD VI STRP: ORAL_STRIP | 3 refills | Status: DC

## 2016-10-15 MED ORDER — ONETOUCH ULTRASOFT LANCETS MISC: 3 refills | Status: DC

## 2016-10-15 NOTE — Patient Instructions (Signed)
Diabetes Mellitus and Food It is important for you to manage your blood sugar (glucose) level. Your blood glucose level can be greatly affected by what you eat. Eating healthier foods in the appropriate amounts throughout the day at about the same time each day will help you control your blood glucose level. It can also help slow or prevent worsening of your diabetes mellitus. Healthy eating may even help you improve the level of your blood pressure and reach or maintain a healthy weight. General recommendations for healthful eating and cooking habits include:  Eating meals and snacks regularly. Avoid going long periods of time without eating to lose weight.  Eating a diet that consists mainly of plant-based foods, such as fruits, vegetables, nuts, legumes, and whole grains.  Using low-heat cooking methods, such as baking, instead of high-heat cooking methods, such as deep frying.  Work with your dietitian to make sure you understand how to use the Nutrition Facts information on food labels. How can food affect me? Carbohydrates Carbohydrates affect your blood glucose level more than any other type of food. Your dietitian will help you determine how many carbohydrates to eat at each meal and teach you how to count carbohydrates. Counting carbohydrates is important to keep your blood glucose at a healthy level, especially if you are using insulin or taking certain medicines for diabetes mellitus. Alcohol Alcohol can cause sudden decreases in blood glucose (hypoglycemia), especially if you use insulin or take certain medicines for diabetes mellitus. Hypoglycemia can be a life-threatening condition. Symptoms of hypoglycemia (sleepiness, dizziness, and disorientation) are similar to symptoms of having too much alcohol. If your health care provider has given you approval to drink alcohol, do so in moderation and use the following guidelines:  Women should not have more than one drink per day, and men  should not have more than two drinks per day. One drink is equal to: ? 12 oz of beer. ? 5 oz of wine. ? 1 oz of hard liquor.  Do not drink on an empty stomach.  Keep yourself hydrated. Have water, diet soda, or unsweetened iced tea.  Regular soda, juice, and other mixers might contain a lot of carbohydrates and should be counted.  What foods are not recommended? As you make food choices, it is important to remember that all foods are not the same. Some foods have fewer nutrients per serving than other foods, even though they might have the same number of calories or carbohydrates. It is difficult to get your body what it needs when you eat foods with fewer nutrients. Examples of foods that you should avoid that are high in calories and carbohydrates but low in nutrients include:  Trans fats (most processed foods list trans fats on the Nutrition Facts label).  Regular soda.  Juice.  Candy.  Sweets, such as cake, pie, doughnuts, and cookies.  Fried foods.  What foods can I eat? Eat nutrient-rich foods, which will nourish your body and keep you healthy. The food you should eat also will depend on several factors, including:  The calories you need.  The medicines you take.  Your weight.  Your blood glucose level.  Your blood pressure level.  Your cholesterol level.  You should eat a variety of foods, including:  Protein. ? Lean cuts of meat. ? Proteins low in saturated fats, such as fish, egg whites, and beans. Avoid processed meats.  Fruits and vegetables. ? Fruits and vegetables that may help control blood glucose levels, such as apples,   mangoes, and yams.  Dairy products. ? Choose fat-free or low-fat dairy products, such as milk, yogurt, and cheese.  Grains, bread, pasta, and rice. ? Choose whole grain products, such as multigrain bread, whole oats, and brown rice. These foods may help control blood pressure.  Fats. ? Foods containing healthful fats, such as  nuts, avocado, olive oil, canola oil, and fish.  Does everyone with diabetes mellitus have the same meal plan? Because every person with diabetes mellitus is different, there is not one meal plan that works for everyone. It is very important that you meet with a dietitian who will help you create a meal plan that is just right for you. This information is not intended to replace advice given to you by your health care provider. Make sure you discuss any questions you have with your health care provider. Document Released: 07/01/2005 Document Revised: 03/11/2016 Document Reviewed: 08/31/2013 Elsevier Interactive Patient Education  2017 Elsevier Inc.  

## 2016-10-15 NOTE — Progress Notes (Signed)
Patient: Shari Lee Female    DOB: 16-Aug-1956   60 y.o.   MRN: [id number] Visit Date: 10/15/2016  Today's Provider: Mar Daring, PA-C   Chief Complaint  Patient presents with  . Follow-up    Hypertension, Hypercholesterolemia,Diabetes   Subjective:    HPI  Diabetes Mellitus Type II, Follow-up:   Lab Results  Component Value Date   HGBA1C 9.3 10/15/2016   HGBA1C 12.6 (H) 07/15/2016   HGBA1C 11.3 (H) 07/28/2015   Last seen for diabetes 3 months ago.  Management since then includes add Metformin, but per patient she has tried taking Metformin twice and had to stop due to diarrhea. She also reports that she was taking Glyburide once but started taking it twice as prescribed. She reports excellent compliance with treatment. She is not having side effects.  Current symptoms include none and have been stable. Home blood sugar records: patient not checking  Episodes of hypoglycemia? no   Weight trend: stable Current diet: Low Carbs diet Current exercise: none  ------------------------------------------------------------------------   Hypertension, follow-up:  BP Readings from Last 3 Encounters:  10/15/16 140/80  08/13/16 (!) 94/56  07/15/16 (!) 146/88    She was last seen for hypertension 3 months ago.  BP at that visit was 146/88, but patient did not take medication that morning due to fasting. Management since that visit includes none.She reports excellent compliance with treatment. She is not having side effects.  She is not exercising. She is adherent to low salt diet.   Outside blood pressures are 140's and 90's. She is experiencing none.  Patient denies chest pain, exertional chest pressure/discomfort, fatigue, irregular heart beat, lower extremity edema and palpitations.   Cardiovascular risk factors include diabetes mellitus, dyslipidemia and hypertension.   ------------------------------------------------------------------------    Lipid/Cholesterol, Follow-up:   Last seen for this 3 months ago.  Management since that visit includes none.  Last Lipid Panel:    Component Value Date/Time   CHOL 182 07/15/2016 1102   TRIG 107 07/15/2016 1102   HDL 55 07/15/2016 1102   CHOLHDL 3.7 07/28/2015 0953   LDLCALC 106 (H) 07/15/2016 1102    She reports excellent compliance with treatment. She is not having side effects.   Wt Readings from Last 3 Encounters:  10/15/16 202 lb 12.8 oz (92 kg)  08/13/16 197 lb 14.4 oz (89.8 kg)  07/15/16 208 lb (94.3 kg)    ------------------------------------------------------------------------     Allergies  Allergen Reactions  . Amoxicillin-Pot Clavulanate   . Levsin  [Hyoscyamine]     Other reaction(s): Dizzyness  . Penicillins   . Metformin And Related Diarrhea     Current Outpatient Prescriptions:  .  amLODipine-benazepril (LOTREL) 5-20 MG capsule, TAKE ONE CAPSULE BY MOUTH ONCE DAILY, Disp: 30 capsule, Rfl: 11 .  atenolol (TENORMIN) 50 MG tablet, TAKE ONE TABLET BY MOUTH ONCE DAILY, Disp: 30 tablet, Rfl: 11 .  glucose blood (ONETOUCH VERIO) test strip, Use as instructed, Disp: 100 each, Rfl: 12 .  glucose blood test strip, Use as instructed. Test fasting blood sugar each morning., Disp: 100 each, Rfl: 12 .  glyBURIDE (DIABETA) 2.5 MG tablet, TAKE ONE TABLET BY MOUTH TWICE DAILY, Disp: 60 tablet, Rfl: 8 .  Lancets (ONETOUCH ULTRASOFT) lancets, Use as instructed, Disp: 100 each, Rfl: 12  Review of Systems  Constitutional: Negative for chills and fatigue.  Respiratory: Negative for cough, chest tightness and shortness of breath.   Cardiovascular: Negative for chest pain, palpitations and leg  swelling.  Gastrointestinal: Negative for abdominal pain.  Endocrine: Negative for polydipsia, polyphagia and polyuria.  Neurological: Negative for dizziness, weakness, light-headedness and numbness.     Social History  Substance Use Topics  . Smoking status: Never Smoker  . Smokeless tobacco: Never Used  . Alcohol use No   Objective:   BP 140/80 (BP Location: Right Arm, Patient Position: Sitting, Cuff Size: Normal)   Pulse 75   Temp 97.9 F (36.6 C) (Oral)   Resp 16   Wt 202 lb 12.8 oz (92 kg)   BMI 37.09 kg/m   Physical Exam  Constitutional: She appears well-developed and well-nourished. No distress.  Neck: Normal range of motion. Neck supple. No JVD present. No tracheal deviation present. No thyromegaly present.  Cardiovascular: Normal rate, regular rhythm and normal heart sounds.  Exam reveals no gallop and no friction rub.   No murmur heard. Pulmonary/Chest: Effort normal and breath sounds normal. No respiratory distress. She has no wheezes. She has no rales.  Lymphadenopathy:    She has no cervical adenopathy.  Skin: She is not diaphoretic.  Vitals reviewed.      Assessment & Plan:     1. Uncontrolled type 2 diabetes mellitus with complication, unspecified long term insulin use status (HCC) A1c improved to 9.3 from 12.6. She has been doing a low carbohydrate diet and glyburide 2.5mg  BID. Will continue current medical treatment plan and see her back in 3 months.  - POCT glycosylated hemoglobin (Hb A1C) - glyBURIDE (DIABETA) 2.5 MG tablet; Take 1 tablet (2.5 mg total) by mouth 2 (two) times daily.  Dispense: 180 tablet; Refill: 1 - glucose blood (ONETOUCH VERIO) test strip; Use as instructed  Dispense: 300 each; Refill: 3  2. Hypercholesterolemia without hypertriglyceridemia Stable. Diet controlled.   3. Essential (primary) hypertension Stable. Diagnosis pulled for medication refill. Continue current medical treatment plan. - atenolol (TENORMIN) 50 MG tablet; Take 1 tablet (50 mg total) by mouth daily.  Dispense: 90 tablet; Refill: 1 - amLODipine-benazepril (LOTREL) 5-20 MG capsule; Take 1 capsule by mouth daily.  Dispense: 90 capsule; Refill: [place], PA-C  [facility]

## 2016-10-19 ENCOUNTER — Other Ambulatory Visit: Payer: Self-pay

## 2016-10-19 DIAGNOSIS — E118 Type 2 diabetes mellitus with unspecified complications: Secondary | ICD-10-CM

## 2016-10-19 DIAGNOSIS — E1100 Type 2 diabetes mellitus with hyperosmolarity without nonketotic hyperglycemic-hyperosmolar coma (NKHHC): Secondary | ICD-10-CM

## 2016-10-19 DIAGNOSIS — E1165 Type 2 diabetes mellitus with hyperglycemia: Secondary | ICD-10-CM

## 2016-10-19 NOTE — Telephone Encounter (Signed)
Pharmacy requesting specific directions for lancets. Patient reports she checks her blood sugar 2 times a day. Please review. Thank you. sd

## 2016-10-20 MED ORDER — ONETOUCH ULTRASOFT LANCETS MISC: 3 refills | Status: DC

## 2016-10-20 MED ORDER — GLUCOSE BLOOD VI STRP: ORAL_STRIP | 3 refills | Status: DC

## 2017-01-13 ENCOUNTER — Ambulatory Visit: Payer: BLUE CROSS/BLUE SHIELD | Admitting: Physician Assistant

## 2017-05-23 ENCOUNTER — Ambulatory Visit: Payer: Self-pay | Admitting: Physician Assistant

## 2017-09-20 ENCOUNTER — Other Ambulatory Visit: Payer: Self-pay | Admitting: Family Medicine

## 2017-09-20 DIAGNOSIS — I10 Essential (primary) hypertension: Secondary | ICD-10-CM

## 2017-12-07 ENCOUNTER — Other Ambulatory Visit: Payer: Self-pay | Admitting: Family Medicine

## 2017-12-07 DIAGNOSIS — I10 Essential (primary) hypertension: Secondary | ICD-10-CM

## 2017-12-07 DIAGNOSIS — E1100 Type 2 diabetes mellitus with hyperosmolarity without nonketotic hyperglycemic-hyperosmolar coma (NKHHC): Secondary | ICD-10-CM

## 2017-12-08 ENCOUNTER — Encounter: Payer: Self-pay | Admitting: Physician Assistant

## 2017-12-08 ENCOUNTER — Ambulatory Visit: Payer: BLUE CROSS/BLUE SHIELD | Admitting: Physician Assistant

## 2017-12-08 VITALS — BP 140/70 | HR 74 | Temp 98.3°F | Resp 16 | Ht 62.0 in | Wt 199.6 lb

## 2017-12-08 DIAGNOSIS — E7800 Pure hypercholesterolemia, unspecified: Secondary | ICD-10-CM

## 2017-12-08 DIAGNOSIS — E1165 Type 2 diabetes mellitus with hyperglycemia: Secondary | ICD-10-CM | POA: Diagnosis not present

## 2017-12-08 DIAGNOSIS — I10 Essential (primary) hypertension: Secondary | ICD-10-CM | POA: Diagnosis not present

## 2017-12-08 DIAGNOSIS — J0140 Acute pansinusitis, unspecified: Secondary | ICD-10-CM | POA: Diagnosis not present

## 2017-12-08 MED ORDER — FLUTICASONE PROPIONATE 50 MCG/ACT NA SUSP: 2.0000 | Freq: Every day | NASAL | 6 refills | Status: DC

## 2017-12-08 MED ORDER — DOXYCYCLINE HYCLATE 100 MG PO TABS: 100.0000 mg | ORAL_TABLET | Freq: Two times a day (BID) | ORAL | 0 refills | Status: DC

## 2017-12-08 NOTE — Patient Instructions (Signed)
Doxycycline tablets or capsules What is this medicine? DOXYCYCLINE (dox i SYE kleen) is a tetracycline antibiotic. It kills certain bacteria or stops their growth. It is used to treat many kinds of infections, like dental, skin, respiratory, and urinary tract infections. It also treats acne, Lyme disease, malaria, and certain sexually transmitted infections. This medicine may be used for other purposes; ask your health care provider or pharmacist if you have questions. COMMON BRAND NAME(S): Acticlate, Adoxa, Adoxa CK, Adoxa Pak, Adoxa TT, Alodox, Avidoxy, Doxal, Mondoxyne NL, Monodox, Morgidox 1x, Morgidox 1x Kit, Morgidox 2x, Morgidox 2x Kit, NutriDox, Ocudox, TARGADOX, Vibra-Tabs, Vibramycin What should I tell my health care provider before I take this medicine? They need to know if you have any of these conditions: -liver disease -long exposure to sunlight like working outdoors -stomach problems like colitis -an unusual or allergic reaction to doxycycline, tetracycline antibiotics, other medicines, foods, dyes, or preservatives -pregnant or trying to get pregnant -breast-feeding How should I use this medicine? Take this medicine by mouth with a full glass of water. Follow the directions on the prescription label. It is best to take this medicine without food, but if it upsets your stomach take it with food. Take your medicine at regular intervals. Do not take your medicine more often than directed. Take all of your medicine as directed even if you think you are better. Do not skip doses or stop your medicine early. Talk to your pediatrician regarding the use of this medicine in children. While this drug may be prescribed for selected conditions, precautions do apply. Overdosage: If you think you have taken too much of this medicine contact a poison control center or emergency room at once. NOTE: This medicine is only for you. Do not share this medicine with others. What if I miss a dose? If you  miss a dose, take it as soon as you can. If it is almost time for your next dose, take only that dose. Do not take double or extra doses. What may interact with this medicine? -antacids -barbiturates -birth control pills -bismuth subsalicylate -carbamazepine -methoxyflurane -other antibiotics -phenytoin -vitamins that contain iron -warfarin This list may not describe all possible interactions. Give your health care provider a list of all the medicines, herbs, non-prescription drugs, or dietary supplements you use. Also tell them if you smoke, drink alcohol, or use illegal drugs. Some items may interact with your medicine. What should I watch for while using this medicine? Tell your doctor or health care professional if your symptoms do not improve. Do not treat diarrhea with over the counter products. Contact your doctor if you have diarrhea that lasts more than 2 days or if it is severe and watery. Do not take this medicine just before going to bed. It may not dissolve properly when you lay down and can cause pain in your throat. Drink plenty of fluids while taking this medicine to also help reduce irritation in your throat. This medicine can make you more sensitive to the sun. Keep out of the sun. If you cannot avoid being in the sun, wear protective clothing and use sunscreen. Do not use sun lamps or tanning beds/booths. Birth control pills may not work properly while you are taking this medicine. Talk to your doctor about using an extra method of birth control. If you are being treated for a sexually transmitted infection, avoid sexual contact until you have finished your treatment. Your sexual partner may also need treatment. Avoid antacids, aluminum, calcium, magnesium, and  iron products for 4 hours before and 2 hours after taking a dose of this medicine. If you are using this medicine to prevent malaria, you should still protect yourself from contact with mosquitos. Stay in screened-in  areas, use mosquito nets, keep your body covered, and use an insect repellent. What side effects may I notice from receiving this medicine? Side effects that you should report to your doctor or health care professional as soon as possible: -allergic reactions like skin rash, itching or hives, swelling of the face, lips, or tongue -difficulty breathing -fever -itching in the rectal or genital area -pain on swallowing -redness, blistering, peeling or loosening of the skin, including inside the mouth -severe stomach pain or cramps -unusual bleeding or bruising -unusually weak or tired -yellowing of the eyes or skin Side effects that usually do not require medical attention (report to your doctor or health care professional if they continue or are bothersome): -diarrhea -loss of appetite -nausea, vomiting This list may not describe all possible side effects. Call your doctor for medical advice about side effects. You may report side effects to FDA at 1-800-FDA-1088. Where should I keep my medicine? Keep out of the reach of children. Store at room temperature, below 30 degrees C (86 degrees F). Protect from light. Keep container tightly closed. Throw away any unused medicine after the expiration date. Taking this medicine after the expiration date can make you seriously ill. NOTE: This sheet is a summary. It may not cover all possible information. If you have questions about this medicine, talk to your doctor, pharmacist, or health care provider.  2018 Elsevier/Gold Standard (2015-11-05 17:11:22)

## 2017-12-08 NOTE — Progress Notes (Signed)
Patient: Shari Lee Female    DOB: 12/24/1955   62 y.o.   MRN: [id number] Visit Date: 12/08/2017  Today's Provider: Mar Daring, PA-C   Chief Complaint  Patient presents with  . URI   Subjective:    HPI Upper Respiratory Infection: Patient complains of symptoms of a URI, possible sinusitis. Symptoms include congestion, cough and plugged sensation in both ears. Onset of symptoms was several week ago, gradually worsening since that time. She also c/o bilateral ear pressure/pain, congestion, hand/face or pedal edema, nasal congestion, non productive cough, post nasal drip and sore throat for the past 2 days .  She is drinking plenty of fluids. Evaluation to date: 11/25/2017 at ENT was started on Cefdinir 10 days . Treatment to date: none.     Allergies  Allergen Reactions  . Amoxicillin-Pot Clavulanate   . Levsin  [Hyoscyamine]     Other reaction(s): Dizzyness  . Penicillins   . Metformin And Related Diarrhea     Current Outpatient Medications:  .  amLODipine-benazepril (LOTREL) 5-20 MG capsule, TAKE ONE CAPSULE BY MOUTH ONCE DAILY, Disp: 90 capsule, Rfl: 1 .  atenolol (TENORMIN) 50 MG tablet, TAKE ONE TABLET BY MOUTH ONCE DAILY, Disp: 90 tablet, Rfl: 3 .  glucose blood (ONETOUCH VERIO) test strip, Check blood sugar up to 2 times a day., Disp: 300 each, Rfl: 3 .  glyBURIDE (DIABETA) 2.5 MG tablet, TAKE ONE TABLET BY MOUTH TWICE DAILY, Disp: 180 tablet, Rfl: 3 .  Lancets (ONETOUCH ULTRASOFT) lancets, Check blood sugar up two times a day., Disp: 300 each, Rfl: 3  Review of Systems  Constitutional: Negative.   HENT: Positive for congestion, postnasal drip, rhinorrhea, sinus pressure and sore throat.   Respiratory: Positive for cough.   Cardiovascular: Negative.   Neurological: Positive for headaches.    Social History   Tobacco Use  . Smoking status: Never Smoker  . Smokeless tobacco: Never Used  Substance Use Topics  . Alcohol use: No    Alcohol/week:  0.0 oz   Objective:   BP 140/70 (BP Location: Left Arm, Patient Position: Sitting, Cuff Size: Large)   Pulse 74   Temp 98.3 F (36.8 C) (Oral)   Resp 16   Ht 5\' 2"  (1.575 m)   Wt 199 lb 9.6 oz (90.5 kg)   SpO2 99%   BMI 36.51 kg/m  Vitals:   12/08/17 1627  BP: 140/70  Pulse: 74  Resp: 16  Temp: 98.3 F (36.8 C)  TempSrc: Oral  SpO2: 99%  Weight: 199 lb 9.6 oz (90.5 kg)  Height: 5\' 2"  (1.575 m)     Physical Exam  Constitutional: She appears well-developed and well-nourished. No distress.  HENT:  Head: Normocephalic and atraumatic.  Right Ear: Tympanic membrane, external ear and ear canal normal. Decreased hearing is noted.  Left Ear: Tympanic membrane, external ear and ear canal normal. Decreased hearing is noted.  Nose: Mucosal edema present. Right sinus exhibits maxillary sinus tenderness and frontal sinus tenderness. Left sinus exhibits maxillary sinus tenderness and frontal sinus tenderness.  Mouth/Throat: Uvula is midline, oropharynx is clear and moist and mucous membranes are normal. No oropharyngeal exudate.  Neck: Normal range of motion. Neck supple. No tracheal deviation present. No thyromegaly present.  Cardiovascular: Normal rate, regular rhythm and normal heart sounds. Exam reveals no gallop and no friction rub.  No murmur heard. Pulmonary/Chest: Effort normal and breath sounds normal. No stridor. No respiratory distress. She has no wheezes. She has  no rales.  Lymphadenopathy:    She has no cervical adenopathy.  Skin: She is not diaphoretic.  Vitals reviewed.       Assessment & Plan:     1. Acute pansinusitis, recurrence not specified Worsening symptoms that have not responded to OTC medications. Will give Doxycycline as below. Continue allergy medications. Stay well hydrated and get plenty of rest. Call if no symptom improvement or if symptoms worsen. - doxycycline (VIBRA-TABS) 100 MG tablet; Take 1 tablet (100 mg total) by mouth 2 (two) times daily.   Dispense: 20 tablet; Refill: 0 - fluticasone (FLONASE) 50 MCG/ACT nasal spray; Place 2 sprays into both nostrils daily.  Dispense: 16 g; Refill: 6  2. Essential (primary) hypertension Will check labs as below and f/u pending results. - CBC w/Diff/Platelet - Comprehensive Metabolic Panel (CMET) - TSH - HgB A1c - Lipid Profile  3. Uncontrolled type 2 diabetes mellitus with hyperglycemia ([place]) Will check labs as below and f/u pending results. - CBC w/Diff/Platelet - Comprehensive Metabolic Panel (CMET) - TSH - HgB A1c - Lipid Profile  4. Hypercholesterolemia without hypertriglyceridemia Will check labs as below and f/u pending results. - CBC w/Diff/Platelet - Comprehensive Metabolic Panel (CMET) - TSH - HgB A1c - Lipid Profile       Mar Daring, PA-C  [facility]

## 2017-12-10 LAB — COMPREHENSIVE METABOLIC PANEL
A/G RATIO: 1.4 (ref 1.2–2.2)
ALBUMIN: 4 g/dL (ref 3.6–4.8)
ALT: 21 IU/L (ref 0–32)
AST: 15 IU/L (ref 0–40)
Alkaline Phosphatase: 74 IU/L (ref 39–117)
BUN / CREAT RATIO: 19 (ref 12–28)
BUN: 14 mg/dL (ref 8–27)
Bilirubin Total: <0.2 mg/dL (ref 0.0–1.2)
CALCIUM: 9.3 mg/dL (ref 8.7–10.3)
CO2: 20 mmol/L (ref 20–29)
Chloride: 103 mmol/L (ref 96–106)
Creatinine, Ser: 0.72 mg/dL (ref 0.57–1.00)
GFR, EST AFRICAN AMERICAN: 105 mL/min/{1.73_m2} (ref >59)
GFR, EST NON AFRICAN AMERICAN: 91 mL/min/{1.73_m2} (ref >59)
Globulin, Total: 2.8 g/dL (ref 1.5–4.5)
Glucose: 290 mg/dL — ABNORMAL HIGH (ref 65–99)
POTASSIUM: 4.7 mmol/L (ref 3.5–5.2)
Sodium: 138 mmol/L (ref 134–144)
TOTAL PROTEIN: 6.8 g/dL (ref 6.0–8.5)

## 2017-12-10 LAB — HEMOGLOBIN A1C
ESTIMATED AVERAGE GLUCOSE: 326 mg/dL
Hgb A1c MFr Bld: 13 % — ABNORMAL HIGH (ref 4.8–5.6)

## 2017-12-10 LAB — CBC WITH DIFFERENTIAL/PLATELET
BASOS: 0 %
Basophils Absolute: 0 10*3/uL (ref 0.0–0.2)
EOS (ABSOLUTE): 0.1 10*3/uL (ref 0.0–0.4)
EOS: 1 %
HEMOGLOBIN: 12.7 g/dL (ref 11.1–15.9)
Hematocrit: 38.7 % (ref 34.0–46.6)
IMMATURE GRANS (ABS): 0 10*3/uL (ref 0.0–0.1)
Immature Granulocytes: 0 %
LYMPHS: 30 %
Lymphocytes Absolute: 2.7 10*3/uL (ref 0.7–3.1)
MCH: 28.5 pg (ref 26.6–33.0)
MCHC: 32.8 g/dL (ref 31.5–35.7)
MCV: 87 fL (ref 79–97)
MONOCYTES: 6 %
Monocytes Absolute: 0.6 10*3/uL (ref 0.1–0.9)
NEUTROS ABS: 5.6 10*3/uL (ref 1.4–7.0)
NEUTROS PCT: 63 %
PLATELETS: 396 10*3/uL — AB (ref 150–379)
RBC: 4.45 x10E6/uL (ref 3.77–5.28)
RDW: 13.5 % (ref 12.3–15.4)
WBC: 9 10*3/uL (ref 3.4–10.8)

## 2017-12-10 LAB — TSH: TSH: 2.85 u[IU]/mL (ref 0.450–4.500)

## 2017-12-10 LAB — LIPID PANEL
CHOLESTEROL TOTAL: 170 mg/dL (ref 100–199)
Chol/HDL Ratio: 3.2 ratio (ref 0.0–4.4)
HDL: 53 mg/dL (ref >39)
LDL Calculated: 103 mg/dL — ABNORMAL HIGH (ref 0–99)
Triglycerides: 68 mg/dL (ref 0–149)
VLDL CHOLESTEROL CAL: 14 mg/dL (ref 5–40)

## 2017-12-12 ENCOUNTER — Telehealth: Payer: Self-pay

## 2017-12-12 NOTE — Telephone Encounter (Signed)
-----   Message from Mar Daring, PA-C sent at 12/12/2017  1:39 PM EST ----- A1c up to 13.0. Would recommend appt soon as you need more medication for your diabetes to better control and prevent adverse long-term outcomes (kidney failure, blindness, heart attack, stroke, etc). Patient would require longer appt time to discuss options. She looses her insurance at end of month. All other labs are WNL and/or stable.

## 2017-12-12 NOTE — Telephone Encounter (Signed)
Patient advised as below. Patient verbalizes understanding and is in agreement with treatment plan.  

## 2017-12-12 NOTE — Telephone Encounter (Signed)
LMTCB

## 2017-12-13 ENCOUNTER — Telehealth: Payer: Self-pay | Admitting: Physician Assistant

## 2017-12-13 ENCOUNTER — Ambulatory Visit: Payer: BLUE CROSS/BLUE SHIELD | Admitting: Physician Assistant

## 2017-12-13 ENCOUNTER — Encounter: Payer: Self-pay | Admitting: Physician Assistant

## 2017-12-13 VITALS — BP 160/90 | HR 78 | Temp 98.3°F | Resp 16 | Wt 199.0 lb

## 2017-12-13 DIAGNOSIS — E119 Type 2 diabetes mellitus without complications: Secondary | ICD-10-CM

## 2017-12-13 DIAGNOSIS — B379 Candidiasis, unspecified: Secondary | ICD-10-CM

## 2017-12-13 MED ORDER — EMPAGLIFLOZIN 25 MG PO TABS: 25.0000 mg | ORAL_TABLET | Freq: Every day | ORAL | 1 refills | Status: DC

## 2017-12-13 MED ORDER — FLUCONAZOLE 150 MG PO TABS: 150.0000 mg | ORAL_TABLET | Freq: Once | ORAL | 0 refills | Status: DC

## 2017-12-13 NOTE — Telephone Encounter (Signed)
Diflucan sent in and yes take Glyburide as well.

## 2017-12-13 NOTE — Telephone Encounter (Signed)
Pt stated that she was advised to start empagliflozin (JARDIANCE) 25 MG TABS tablet. Pt wanted to make sure if she was supposed to take that and continue her glyBURIDE (DIABETA) 2.5 MG tablet or just take the Jardiance.   Pt also stated that at her OV today Tawanna Sat advised that she would send in an Rx for yeast infection to [facility] but it wasn't sent and pt is requesting that be sent asap. Please advise. Thanks TNP

## 2017-12-13 NOTE — Telephone Encounter (Signed)
Patient advised as below.  

## 2017-12-13 NOTE — Progress Notes (Signed)
Patient: Shari Lee Female    DOB: Jun 12, 1956   62 y.o.   MRN: [id number] Visit Date: 12/13/2017  Today's Provider: Mar Daring, PA-C   Chief Complaint  Patient presents with  . Diabetes   Subjective:    HPI Patient here today to talk about her uncontrolled type 2 diabetes. Patient reports good compliance and tolerance of glyburide. Patient reports she is not exercising or following a specific diet. Patient reports she has been to [facility] in the past. Patient reports she has been very stressed due to being out of a job for about eight months and has not been making healthy choices.     Allergies  Allergen Reactions  . Amoxicillin-Pot Clavulanate   . Levsin  [Hyoscyamine]     Other reaction(s): Dizzyness  . Penicillins   . Metformin And Related Diarrhea     Current Outpatient Medications:  .  amLODipine-benazepril (LOTREL) 5-20 MG capsule, TAKE ONE CAPSULE BY MOUTH ONCE DAILY, Disp: 90 capsule, Rfl: 1 .  atenolol (TENORMIN) 50 MG tablet, TAKE ONE TABLET BY MOUTH ONCE DAILY, Disp: 90 tablet, Rfl: 3 .  doxycycline (VIBRA-TABS) 100 MG tablet, Take 1 tablet (100 mg total) by mouth 2 (two) times daily., Disp: 20 tablet, Rfl: 0 .  fluticasone (FLONASE) 50 MCG/ACT nasal spray, Place 2 sprays into both nostrils daily., Disp: 16 g, Rfl: 6 .  glucose blood (ONETOUCH VERIO) test strip, Check blood sugar up to 2 times a day., Disp: 300 each, Rfl: 3 .  glyBURIDE (DIABETA) 2.5 MG tablet, TAKE ONE TABLET BY MOUTH TWICE DAILY, Disp: 180 tablet, Rfl: 3 .  Lancets (ONETOUCH ULTRASOFT) lancets, Check blood sugar up two times a day., Disp: 300 each, Rfl: 3  Review of Systems  Constitutional: Negative.   Respiratory: Negative.   Cardiovascular: Negative.   Gastrointestinal: Negative.   Neurological: Negative.   Psychiatric/Behavioral: The patient is nervous/anxious.     Social History   Tobacco Use  . Smoking status: Never Smoker  . Smokeless tobacco: Never Used   Substance Use Topics  . Alcohol use: No    Alcohol/week: 0.0 oz   Objective:   BP (!) 160/90 (BP Location: Left Arm, Patient Position: Sitting, Cuff Size: Large)   Pulse 78   Temp 98.3 F (36.8 C) (Oral)   Resp 16   Wt 199 lb (90.3 kg)   SpO2 97%   BMI 36.40 kg/m  Vitals:   12/13/17 0845  BP: (!) 160/90  Pulse: 78  Resp: 16  Temp: 98.3 F (36.8 C)  TempSrc: Oral  SpO2: 97%  Weight: 199 lb (90.3 kg)     Physical Exam  Constitutional: She appears well-developed and well-nourished. No distress.  Neck: Normal range of motion. Neck supple. No JVD present. No tracheal deviation present. No thyromegaly present.  Cardiovascular: Normal rate, regular rhythm and normal heart sounds. Exam reveals no gallop and no friction rub.  No murmur heard. Pulmonary/Chest: Effort normal and breath sounds normal. No respiratory distress. She has no wheezes. She has no rales.  Lymphadenopathy:    She has no cervical adenopathy.  Skin: She is not diaphoretic.  Psychiatric: She has a normal mood and affect. Her behavior is normal. Judgment and thought content normal.  Vitals reviewed.       Assessment & Plan:     1. Type 2 diabetes mellitus without complication, without long-term current use of insulin (HCC) Most recent A1c was 13.0, increased from 7.7. I will  add Jardiance as below. Patient was given samples for Jardiance 10mg  to start x 1 week, then increase to 25mg  (one week sample also given). Rx was sent in to pharmacy in case PA is required. Patient was also given a Rx assistance card to help with cost. I am trying to get patient established with the medication to help lower her A1c, however, patient loses her insurance at the end of the month. I have also reached out to Etta Grandchild to assist patient with future medication management, information for [facility] and pharmaceutical patient assistance programs. Once patient has insurance again she can come back here to be followed, but if  she establishes with [facility] her [facility] while without insurance it will save her self payment cost. She is in agreement with this treatment plan.  - empagliflozin (JARDIANCE) 25 MG TABS tablet; Take 25 mg by mouth daily.  Dispense: 90 tablet; Refill: [place], PA-C  [facility]

## 2017-12-14 ENCOUNTER — Encounter: Payer: Self-pay | Admitting: Physician Assistant

## 2017-12-14 NOTE — Patient Instructions (Signed)
Diabetes Mellitus and Nutrition When you have diabetes (diabetes mellitus), it is very important to have healthy eating habits because your blood sugar (glucose) levels are greatly affected by what you eat and drink. Eating healthy foods in the appropriate amounts, at about the same times every day, can help you:  Control your blood glucose.  Lower your risk of heart disease.  Improve your blood pressure.  Reach or maintain a healthy weight.  Every person with diabetes is different, and each person has different needs for a meal plan. Your health care provider may recommend that you work with a diet and nutrition specialist (dietitian) to make a meal plan that is best for you. Your meal plan may vary depending on factors such as:  The calories you need.  The medicines you take.  Your weight.  Your blood glucose, blood pressure, and cholesterol levels.  Your activity level.  Other health conditions you have, such as heart or kidney disease.  How do carbohydrates affect me? Carbohydrates affect your blood glucose level more than any other type of food. Eating carbohydrates naturally increases the amount of glucose in your blood. Carbohydrate counting is a method for keeping track of how many carbohydrates you eat. Counting carbohydrates is important to keep your blood glucose at a healthy level, especially if you use insulin or take certain oral diabetes medicines. It is important to know how many carbohydrates you can safely have in each meal. This is different for every person. Your dietitian can help you calculate how many carbohydrates you should have at each meal and for snack. Foods that contain carbohydrates include:  Bread, cereal, rice, pasta, and crackers.  Potatoes and corn.  Peas, beans, and lentils.  Milk and yogurt.  Fruit and juice.  Desserts, such as cakes, cookies, ice cream, and candy.  How does alcohol affect me? Alcohol can cause a sudden decrease in blood  glucose (hypoglycemia), especially if you use insulin or take certain oral diabetes medicines. Hypoglycemia can be a life-threatening condition. Symptoms of hypoglycemia (sleepiness, dizziness, and confusion) are similar to symptoms of having too much alcohol. If your health care provider says that alcohol is safe for you, follow these guidelines:  Limit alcohol intake to no more than 1 drink per day for nonpregnant women and 2 drinks per day for men. One drink equals 12 oz of beer, 5 oz of wine, or 1 oz of hard liquor.  Do not drink on an empty stomach.  Keep yourself hydrated with water, diet soda, or unsweetened iced tea.  Keep in mind that regular soda, juice, and other mixers may contain a lot of sugar and must be counted as carbohydrates.  What are tips for following this plan? Reading food labels  Start by checking the serving size on the label. The amount of calories, carbohydrates, fats, and other nutrients listed on the label are based on one serving of the food. Many foods contain more than one serving per package.  Check the total grams (g) of carbohydrates in one serving. You can calculate the number of servings of carbohydrates in one serving by dividing the total carbohydrates by 15. For example, if a food has 30 g of total carbohydrates, it would be equal to 2 servings of carbohydrates.  Check the number of grams (g) of saturated and trans fats in one serving. Choose foods that have low or no amount of these fats.  Check the number of milligrams (mg) of sodium in one serving. Most people   should limit total sodium intake to less than 2,300 mg per day.  Always check the nutrition information of foods labeled as "low-fat" or "nonfat". These foods may be higher in added sugar or refined carbohydrates and should be avoided.  Talk to your dietitian to identify your daily goals for nutrients listed on the label. Shopping  Avoid buying canned, premade, or processed foods. These  foods tend to be high in fat, sodium, and added sugar.  Shop around the outside edge of the grocery store. This includes fresh fruits and vegetables, bulk grains, fresh meats, and fresh dairy. Cooking  Use low-heat cooking methods, such as baking, instead of high-heat cooking methods like deep frying.  Cook using healthy oils, such as olive, canola, or sunflower oil.  Avoid cooking with butter, cream, or high-fat meats. Meal planning  Eat meals and snacks regularly, preferably at the same times every day. Avoid going long periods of time without eating.  Eat foods high in fiber, such as fresh fruits, vegetables, beans, and whole grains. Talk to your dietitian about how many servings of carbohydrates you can eat at each meal.  Eat 4-6 ounces of lean protein each day, such as lean meat, chicken, fish, eggs, or tofu. 1 ounce is equal to 1 ounce of meat, chicken, or fish, 1 egg, or 1/4 cup of tofu.  Eat some foods each day that contain healthy fats, such as avocado, nuts, seeds, and fish. Lifestyle   Check your blood glucose regularly.  Exercise at least 30 minutes 5 or more days each week, or as told by your health care provider.  Take medicines as told by your health care provider.  Do not use any products that contain nicotine or tobacco, such as cigarettes and e-cigarettes. If you need help quitting, ask your health care provider.  Work with a counselor or diabetes educator to identify strategies to manage stress and any emotional and social challenges. What are some questions to ask my health care provider?  Do I need to meet with a diabetes educator?  Do I need to meet with a dietitian?  What number can I call if I have questions?  When are the best times to check my blood glucose? Where to find more information:  American Diabetes Association: diabetes.org/food-and-fitness/food  Academy of Nutrition and Dietetics:  www.eatright.org/resources/health/diseases-and-conditions/diabetes  National Institute of Diabetes and Digestive and Kidney Diseases (NIH): www.niddk.nih.gov/health-information/diabetes/overview/diet-eating-physical-activity Summary  A healthy meal plan will help you control your blood glucose and maintain a healthy lifestyle.  Working with a diet and nutrition specialist (dietitian) can help you make a meal plan that is best for you.  Keep in mind that carbohydrates and alcohol have immediate effects on your blood glucose levels. It is important to count carbohydrates and to use alcohol carefully. This information is not intended to replace advice given to you by your health care provider. Make sure you discuss any questions you have with your health care provider. Document Released: 07/01/2005 Document Revised: 11/08/2016 Document Reviewed: 11/08/2016 Elsevier Interactive Patient Education  2018 Elsevier Inc.  

## 2018-01-24 ENCOUNTER — Telehealth: Payer: Self-pay | Admitting: Physician Assistant

## 2018-01-24 NOTE — Telephone Encounter (Signed)
Received a ROI from [facility] on 08/24/17, but we have not seen patient since 10/15/2016,

## 2018-02-27 ENCOUNTER — Other Ambulatory Visit: Payer: Self-pay | Admitting: Physician Assistant

## 2018-02-27 DIAGNOSIS — I10 Essential (primary) hypertension: Secondary | ICD-10-CM

## 2018-07-11 ENCOUNTER — Other Ambulatory Visit: Payer: Self-pay | Admitting: Physician Assistant

## 2018-07-11 DIAGNOSIS — E119 Type 2 diabetes mellitus without complications: Secondary | ICD-10-CM

## 2018-07-11 DIAGNOSIS — B379 Candidiasis, unspecified: Secondary | ICD-10-CM

## 2018-07-31 LAB — HM DIABETES EYE EXAM

## 2018-08-07 ENCOUNTER — Telehealth: Payer: Self-pay | Admitting: Physician Assistant

## 2018-08-07 DIAGNOSIS — B379 Candidiasis, unspecified: Secondary | ICD-10-CM

## 2018-08-07 MED ORDER — FLUCONAZOLE 150 MG PO TABS: ORAL_TABLET | ORAL | 2 refills | Status: DC

## 2018-08-07 NOTE — Telephone Encounter (Signed)
Pt has been having yeast infection on and off since she started on the Jardiance.  She wants to know if you can send something into the pharmacy.    She uses [address]  CB#  [phone]  Thanks  Con Memos

## 2018-08-07 NOTE — Telephone Encounter (Signed)
Please Review

## 2018-08-07 NOTE — Telephone Encounter (Signed)
Patient advised.

## 2018-08-07 NOTE — Telephone Encounter (Signed)
Diflucan sent in. If she has to repeat through all the refills she needs to be seen to change Jardiance.

## 2018-10-19 ENCOUNTER — Other Ambulatory Visit: Payer: Self-pay | Admitting: Physician Assistant

## 2018-10-19 DIAGNOSIS — I10 Essential (primary) hypertension: Secondary | ICD-10-CM

## 2018-12-13 ENCOUNTER — Encounter: Payer: Self-pay | Admitting: Physician Assistant

## 2018-12-13 ENCOUNTER — Ambulatory Visit (INDEPENDENT_AMBULATORY_CARE_PROVIDER_SITE_OTHER): Payer: BLUE CROSS/BLUE SHIELD | Admitting: Physician Assistant

## 2018-12-13 ENCOUNTER — Other Ambulatory Visit (HOSPITAL_COMMUNITY)
Admission: RE | Admit: 2018-12-13 | Discharge: 2018-12-13 | Disposition: A | Discharge status: Home / Self Care | Payer: BLUE CROSS/BLUE SHIELD | Source: Ambulatory Visit | Attending: Physician Assistant | Admitting: Physician Assistant

## 2018-12-13 VITALS — BP 139/89 | HR 76 | Temp 97.8°F | Resp 16 | Wt 192.6 lb

## 2018-12-13 DIAGNOSIS — B379 Candidiasis, unspecified: Secondary | ICD-10-CM | POA: Diagnosis not present

## 2018-12-13 DIAGNOSIS — B356 Tinea cruris: Secondary | ICD-10-CM | POA: Diagnosis not present

## 2018-12-13 DIAGNOSIS — N898 Other specified noninflammatory disorders of vagina: Secondary | ICD-10-CM

## 2018-12-13 MED ORDER — FLUCONAZOLE 150 MG PO TABS: ORAL_TABLET | ORAL | 2 refills | Status: DC

## 2018-12-13 MED ORDER — NYSTATIN 100000 UNIT/GM EX CREA: 1.0000 "application " | TOPICAL_CREAM | Freq: Two times a day (BID) | CUTANEOUS | 0 refills | Status: DC

## 2018-12-13 NOTE — Progress Notes (Signed)
Patient: Shari Lee Female    DOB: 1956-02-14   63 y.o.   MRN: [id number] Visit Date: 12/13/2018  Today's Provider: Mar Daring, PA-C   Chief Complaint  Patient presents with  . Vaginal Itching   Subjective:     Vaginal Itching  The patient's primary symptoms include genital itching, a genital rash and vaginal discharge. The patient's pertinent negatives include no genital odor. This is a new problem. The current episode started more than 1 year ago (Since November before Thanksgiving). The problem has been gradually worsening. The patient is experiencing no pain. The problem affects both sides. She is not pregnant. Associated symptoms include frequency, rash and urgency. Pertinent negatives include no abdominal pain, back pain, chills, constipation, dysuria, fever, flank pain or hematuria. The vaginal discharge was white, milky and thick. There has been no bleeding. She has not been passing clots. She has not been passing tissue. Treatments tried: Diflucan 3 pills. The treatment provided no relief. She is not sexually active. She uses hysterectomy for contraception.  She is also having some burning in her rectum. She reports that she is also red in groin area and iner thigh.   Patient stop her Jardiance back in November because she thought her symptoms were the cause of the medicine. She reports that she stop it but the "yeast"  Infection has not gotten better.   Allergies  Allergen Reactions  . Amoxicillin-Pot Clavulanate   . Levsin  [Hyoscyamine]     Other reaction(s): Dizzyness  . Penicillins   . Metformin And Related Diarrhea     Current Outpatient Medications:  .  amLODipine-benazepril (LOTREL) 5-20 MG capsule, TAKE 1 CAPSULE BY MOUTH ONCE DAILY, Disp: 90 capsule, Rfl: 0 .  atenolol (TENORMIN) 50 MG tablet, TAKE ONE TABLET BY MOUTH ONCE DAILY, Disp: 90 tablet, Rfl: 3 .  glucose blood (ONETOUCH VERIO) test strip, Check blood sugar up to 2 times a day., Disp:  300 each, Rfl: 3 .  glyBURIDE (DIABETA) 2.5 MG tablet, TAKE ONE TABLET BY MOUTH TWICE DAILY, Disp: 180 tablet, Rfl: 3 .  Lancets (ONETOUCH ULTRASOFT) lancets, Check blood sugar up two times a day., Disp: 300 each, Rfl: 3 .  fluconazole (DIFLUCAN) 150 MG tablet, TAKE 1 TABLET BY MOUTH ONCE FOR 1 DOSE. MAY REPEAT IN 72 HOURS IF NEEDED (Patient not taking: Reported on 12/13/2018), Disp: 3 tablet, Rfl: 2 .  fluticasone (FLONASE) 50 MCG/ACT nasal spray, Place 2 sprays into both nostrils daily. (Patient not taking: Reported on 12/13/2018), Disp: 16 g, Rfl: 6 .  JARDIANCE 25 MG TABS tablet, TAKE 1 TABLET BY MOUTH ONCE DAILY (Patient not taking: Reported on 12/13/2018), Disp: 90 tablet, Rfl: 1  Review of Systems  Constitutional: Negative for chills and fever.  Respiratory: Negative.   Cardiovascular: Negative.   Gastrointestinal: Negative for abdominal pain and constipation.  Genitourinary: Positive for frequency, urgency, vaginal discharge and vaginal pain. Negative for dyspareunia, dysuria, enuresis, flank pain, genital sores and hematuria.  Musculoskeletal: Negative for back pain.  Skin: Positive for rash.    Social History   Tobacco Use  . Smoking status: Never Smoker  . Smokeless tobacco: Never Used  Substance Use Topics  . Alcohol use: No    Alcohol/week: 0.0 standard drinks      Objective:   BP 139/89 (BP Location: Left Arm, Patient Position: Sitting, Cuff Size: Large)   Pulse 76   Temp 97.8 F (36.6 C) (Oral)   Resp 16  Wt 192 lb 9.6 oz (87.4 kg)   BMI 35.23 kg/m  Vitals:   12/13/18 0718  BP: 139/89  Pulse: 76  Resp: 16  Temp: 97.8 F (36.6 C)  TempSrc: Oral  Weight: 192 lb 9.6 oz (87.4 kg)     Physical Exam Vitals signs reviewed.  Constitutional:      Appearance: Normal appearance. She is well-developed.  HENT:     Head: Normocephalic and atraumatic.  Neck:     Musculoskeletal: Normal range of motion and neck supple.  Pulmonary:     Effort: Pulmonary effort is  normal. No respiratory distress.  Genitourinary:    Labia:        Right: Rash and tenderness present.        Left: Rash and tenderness present.      Vagina: Vaginal discharge (thick, white), erythema and tenderness present.    Neurological:     Mental Status: She is alert.  Psychiatric:        Behavior: Behavior normal.        Thought Content: Thought content normal.        Judgment: Judgment normal.         Assessment & Plan    1. Yeast infection Diflucan given as below for vaginal yeast. Nystatin cream for external irritation. May mix Nystatin with Desitin or A+D ointment and apply. Keep skin clean and dry. I will see her back on Friday for her CPE.  - fluconazole (DIFLUCAN) 150 MG tablet; TAKE 1 TABLET BY MOUTH ONCE FOR 1 DOSE. MAY REPEAT IN 72 HOURS IF NEEDED  Dispense: 3 tablet; Refill: 2 - nystatin cream (MYCOSTATIN); Apply 1 application topically 2 (two) times daily.  Dispense: 30 g; Refill: 0  2. Vaginal irritation Vaginal swab collected today. I will f/u pending results.  - Cervicovaginal ancillary only  3. Tinea cruris See above medical treatment plan.     Mar Daring, PA-C  [facility]

## 2018-12-13 NOTE — Patient Instructions (Signed)

## 2018-12-13 NOTE — Progress Notes (Signed)
Patient: Shari Lee, Female    DOB: December 06, 1955, 63 y.o.   MRN: [id number] Visit Date: 12/15/2018  Today's Provider: Mar Daring, PA-C   Chief Complaint  Patient presents with  . Annual Exam   Subjective:     Annual physical exam Shari Lee is a 63 y.o. female who presents today for health maintenance and complete physical. She feels well. She reports exercising none. She reports she is sleeping poorly.  Colonoscopy:08/13/2016 Mammogram:07/19/2016-Normal Flu Vaccine: Pt reports she already received it at [facility] in [place]. Also received her pneumonia vaccine. Eye exam done at [place] requested for both vaccines and eye exam.  -----------------------------------------------------------------   Review of Systems  Constitutional: Negative.   HENT: Negative.   Eyes: Negative.   Respiratory: Negative.   Cardiovascular: Negative.   Gastrointestinal: Negative.   Endocrine: Positive for polydipsia.  Genitourinary: Positive for vaginal discharge.  Musculoskeletal: Negative.   Skin: Negative.   Allergic/Immunologic: Negative.   Neurological: Negative.   Hematological: Negative.   Psychiatric/Behavioral: Negative.     Social History      She  reports that she has never smoked. She has never used smokeless tobacco. She reports that she does not drink alcohol or use drugs.       Social History   Socioeconomic History  . Marital status: Divorced    Spouse name: Not on file  . Number of children: Not on file  . Years of education: Not on file  . Highest education level: Not on file  Occupational History  . Not on file  Social Needs  . Financial resource strain: Not on file  . Food insecurity:    Worry: Not on file    Inability: Not on file  . Transportation needs:    Medical: Not on file    Non-medical: Not on file  Tobacco Use  . Smoking status: Never Smoker  . Smokeless tobacco: Never Used  Substance and Sexual Activity  .  Alcohol use: No    Alcohol/week: 0.0 standard drinks  . Drug use: No  . Sexual activity: Not on file  Lifestyle  . Physical activity:    Days per week: Not on file    Minutes per session: Not on file  . Stress: Not on file  Relationships  . Social connections:    Talks on phone: Not on file    Gets together: Not on file    Attends religious service: Not on file    Active member of club or organization: Not on file    Attends meetings of clubs or organizations: Not on file    Relationship status: Not on file  Other Topics Concern  . Not on file  Social History Narrative  . Not on file    Past Medical History:  Diagnosis Date  . Diabetes mellitus without complication ([place])   . Headache   . Hypertension      Patient Active Problem List   Diagnosis Date Noted  . Type 2 diabetes mellitus ([place]) 07/28/2015  . 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 Diabetes mellitus type 2, uncontrolled ([place]) 04/29/2015  . History of digestive disease 04/29/2015  . 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 Hypercholesterolemia without hypertriglyceridemia 12/11/2009  . Apnea, sleep 07/30/2008  . Colon, diverticulosis 06/07/2008  . Essential (primary) hypertension 04/25/2006    Past Surgical History:  Procedure Laterality Date  . ABDOMINAL HYSTERECTOMY  2006  . BREAST BIOPSY Left 2005   benign  .  BREAST EXCISIONAL BIOPSY Left 2005?    -benign  . COLONOSCOPY WITH PROPOFOL N/A 08/13/2016   Procedure: COLONOSCOPY WITH PROPOFOL;  Surgeon: Lucilla Lame, MD;  Location: [place];  Service: Endoscopy;  Laterality: N/A;  . POLYPECTOMY  08/13/2016   Procedure: POLYPECTOMY;  Surgeon: Lucilla Lame, MD;  Location: [place];  Service: Endoscopy;;    Family History        Family Status  Relation Name Status  . Mother  Deceased  . Father  Deceased  . Sister 1 Alive  . Brother 1 Alive  . Daughter  Alive  . Brother 2 Alive  . Brother 3 Alive  . Sister 2 Alive         Her family history includes Breast cancer in her sister; Diabetes in her mother; Diverticulitis in her brother and sister; Heart attack in her mother; Heart disease in her mother; Heart failure in her father; Sleep apnea in her brother.      Allergies  Allergen Reactions  . Amoxicillin-Pot Clavulanate   . Levsin  [Hyoscyamine]     Other reaction(s): Dizzyness  . Penicillins   . Metformin And Related Diarrhea     Current Outpatient Medications:  .  amLODipine-benazepril (LOTREL) 5-20 MG capsule, TAKE 1 CAPSULE BY MOUTH ONCE DAILY, Disp: 90 capsule, Rfl: 0 .  atenolol (TENORMIN) 50 MG tablet, TAKE ONE TABLET BY MOUTH ONCE DAILY, Disp: 90 tablet, Rfl: 3 .  fluconazole (DIFLUCAN) 150 MG tablet, TAKE 1 TABLET BY MOUTH ONCE FOR 1 DOSE. MAY REPEAT IN 72 HOURS IF NEEDED, Disp: 3 tablet, Rfl: 2 .  glucose blood (ONETOUCH VERIO) test strip, Check blood sugar up to 2 times a day., Disp: 300 each, Rfl: 3 .  glyBURIDE (DIABETA) 2.5 MG tablet, TAKE ONE TABLET BY MOUTH TWICE DAILY, Disp: 180 tablet, Rfl: 3 .  Lancets (ONETOUCH ULTRASOFT) lancets, Check blood sugar up two times a day., Disp: 300 each, Rfl: 3 .  nystatin cream (MYCOSTATIN), Apply 1 application topically 2 (two) times daily., Disp: 30 g, Rfl: 0 .  fluticasone (FLONASE) 50 MCG/ACT nasal spray, Place 2 sprays into both nostrils daily. (Patient not taking: Reported on 12/13/2018), Disp: 16 g, Rfl: 6 .  JARDIANCE 25 MG TABS tablet, TAKE 1 TABLET BY MOUTH ONCE DAILY (Patient not taking: Reported on 12/13/2018), Disp: 90 tablet, Rfl: 1   Patient Care Team: Mar Daring, PA-C as PCP - General (Family Medicine)    Objective:    Vitals: BP 138/83 (BP Location: Left Arm, Patient Position: Sitting, Cuff Size: Large)   Pulse 69   Temp 97.8 F (36.6 C) (Oral)   Resp 16   Ht 5\' 2"  (1.575 m)   Wt 192 lb (87.1 kg)   BMI 35.12 kg/m    Vitals:   12/15/18 0706  BP: 138/83  Pulse: 69  Resp: 16  Temp: 97.8 F (36.6 C)  TempSrc: Oral    Weight: 192 lb (87.1 kg)  Height: 5\' 2"  (1.575 m)     Physical Exam Vitals signs reviewed.  Constitutional:      General: She is not in acute distress.    Appearance: Normal appearance. She is well-developed. She is not diaphoretic.  HENT:     Head: Normocephalic and atraumatic.     Right Ear: Tympanic membrane, ear canal and external ear normal.     Left Ear: Tympanic membrane, ear canal and external ear normal.     Nose: Nose normal.     Mouth/Throat:  Mouth: Mucous membranes are moist.     Pharynx: Oropharynx is clear. No oropharyngeal exudate.  Eyes:     General: No scleral icterus.       Right eye: No discharge.        Left eye: No discharge.     Extraocular Movements: Extraocular movements intact.     Conjunctiva/sclera: Conjunctivae normal.     Pupils: Pupils are equal, round, and reactive to light.  Neck:     Musculoskeletal: Normal range of motion and neck supple.     Thyroid: No thyromegaly.     Vascular: No JVD.     Trachea: No tracheal deviation.  Cardiovascular:     Rate and Rhythm: Normal rate and regular rhythm.     Pulses: Normal pulses.     Heart sounds: Normal heart sounds. No murmur. No friction rub. No gallop.   Pulmonary:     Effort: Pulmonary effort is normal. No respiratory distress.     Breath sounds: Normal breath sounds. No wheezing or rales.  Chest:     Chest wall: No tenderness.  Abdominal:     General: Bowel sounds are normal. There is no distension.     Palpations: Abdomen is soft. There is no mass.     Tenderness: There is no abdominal tenderness. There is no guarding or rebound.  Musculoskeletal: Normal range of motion.        General: No tenderness.  Lymphadenopathy:     Cervical: No cervical adenopathy.  Skin:    General: Skin is warm and dry.     Findings: No rash.  Neurological:     Mental Status: She is alert and oriented to person, place, and time.  Psychiatric:        Mood and Affect: Mood normal.        Behavior:  Behavior normal.        Thought Content: Thought content normal.        Judgment: Judgment normal.    Diabetic Foot Exam - Simple   Simple Foot Form Diabetic Foot exam was performed with the following findings:  Yes 12/15/2018  8:23 AM  Visual Inspection No deformities, no ulcerations, no other skin breakdown bilaterally:  Yes Sensation Testing Intact to touch and monofilament testing bilaterally:  Yes Pulse Check Posterior Tibialis and Dorsalis pulse intact bilaterally:  Yes Comments     Depression Screen PHQ 2/9 Scores 12/13/2017 07/15/2016  PHQ - 2 Score 4 0  PHQ- 9 Score 8 -       Assessment & Plan:     Routine Health Maintenance and Physical Exam  Exercise Activities and Dietary recommendations Goals   None     Immunization History  Administered Date(s) Administered  . Influenza,inj,Quad PF,6+ Mos 07/28/2015  . Td 08/26/2011  . Tdap 08/26/2011    Health Maintenance  Topic Date Due  . PNEUMOCOCCAL POLYSACCHARIDE VACCINE AGE 72-64 HIGH RISK  12/28/1958  . OPHTHALMOLOGY EXAM  12/28/1966  . HIV Screening  12/28/1971  . FOOT EXAM  07/15/2017  . INFLUENZA VACCINE  05/18/2018  . HEMOGLOBIN A1C  06/08/2018  . MAMMOGRAM  07/19/2018  . PAP SMEAR-Modifier  07/16/2019  . COLONOSCOPY  08/13/2021  . TETANUS/TDAP  08/25/2021  . Hepatitis C Screening  Completed     Discussed health benefits of physical activity, and encouraged her to engage in regular exercise appropriate for her age and condition.    1. Encounter for annual physical exam Normal physical exam today. Will check labs as  below and f/u pending lab results. If labs are stable and WNL she will not need to have these rechecked for one year at her next annual physical exam. She is to call the office in the meantime if she has any acute issue, questions or concerns. - CBC with Differential/Platelet - Comprehensive metabolic panel - TSH  2. Encounter for screening mammogram for breast cancer Breast exam  today was normal. There is no family history of breast cancer. She does perform regular self breast exams. Mammogram was ordered as below. Information for [facility] Breast clinic was given to patient so she may schedule her mammogram at her convenience. - MM 3D SCREEN BREAST BILATERAL; Future  3. Type 2 diabetes mellitus with hyperglycemia, without long-term current use of insulin (HCC) Not checked in a while because patient did not have insurance. Will check labs as below and f/u pending results. Continue glyburide 2.5mg  BID. - Hemoglobin A1c - POCT UA - Microalbumin  4. Hypercholesterolemia without hypertriglyceridemia Diet controlled. Patient has declined statins in the past. Will check labs as below and f/u pending results. - Lipid panel  5. Class 2 severe obesity due to excess calories with serious comorbidity and body mass index (BMI) of 35.0 to 35.9 in adult [facility]) Counseled patient on healthy lifestyle modifications including dieting and exercise.   6. Screening for HIV without presence of risk factors Will check labs as below and f/u pending results. - HIV Antibody (routine testing w rflx)  --------------------------------------------------------------------    Mar Daring, PA-C  [place]

## 2018-12-14 LAB — CERVICOVAGINAL ANCILLARY ONLY
BACTERIAL VAGINITIS: NEGATIVE
CANDIDA VAGINITIS: POSITIVE — AB
Chlamydia: NEGATIVE
Neisseria Gonorrhea: NEGATIVE
Trichomonas: NEGATIVE

## 2018-12-15 ENCOUNTER — Encounter: Payer: Self-pay | Admitting: Physician Assistant

## 2018-12-15 ENCOUNTER — Ambulatory Visit (INDEPENDENT_AMBULATORY_CARE_PROVIDER_SITE_OTHER): Payer: BLUE CROSS/BLUE SHIELD | Admitting: Physician Assistant

## 2018-12-15 VITALS — BP 138/83 | HR 69 | Temp 97.8°F | Resp 16 | Ht 62.0 in | Wt 192.0 lb

## 2018-12-15 DIAGNOSIS — Z1231 Encounter for screening mammogram for malignant neoplasm of breast: Secondary | ICD-10-CM

## 2018-12-15 DIAGNOSIS — Z0000 Encounter for general adult medical examination without abnormal findings: Secondary | ICD-10-CM | POA: Diagnosis not present

## 2018-12-15 DIAGNOSIS — E7800 Pure hypercholesterolemia, unspecified: Secondary | ICD-10-CM

## 2018-12-15 DIAGNOSIS — E1165 Type 2 diabetes mellitus with hyperglycemia: Secondary | ICD-10-CM | POA: Diagnosis not present

## 2018-12-15 DIAGNOSIS — Z6835 Body mass index (BMI) 35.0-35.9, adult: Secondary | ICD-10-CM

## 2018-12-15 DIAGNOSIS — Z114 Encounter for screening for human immunodeficiency virus [HIV]: Secondary | ICD-10-CM

## 2018-12-15 LAB — POCT UA - MICROALBUMIN: Microalbumin Ur, POC: NEGATIVE mg/L

## 2018-12-15 NOTE — Patient Instructions (Signed)
Health Maintenance for Postmenopausal Women Menopause is a normal process in which your reproductive ability comes to an end. This process happens gradually over a span of months to years, usually between the ages of 62 and 89. Menopause is complete when you have missed 12 consecutive menstrual periods. It is important to talk with your health care provider about some of the most common conditions that affect postmenopausal women, such as heart disease, cancer, and bone loss (osteoporosis). Adopting a healthy lifestyle and getting preventive care can help to promote your health and wellness. Those actions can also lower your chances of developing some of these common conditions. What should I know about menopause? During menopause, you may experience a number of symptoms, such as:  Moderate-to-severe hot flashes.  Night sweats.  Decrease in sex drive.  Mood swings.  Headaches.  Tiredness.  Irritability.  Memory problems.  Insomnia. Choosing to treat or not to treat menopausal changes is an individual decision that you make with your health care provider. What should I know about hormone replacement therapy and supplements? Hormone therapy products are effective for treating symptoms that are associated with menopause, such as hot flashes and night sweats. Hormone replacement carries certain risks, especially as you become older. If you are thinking about using estrogen or estrogen with progestin treatments, discuss the benefits and risks with your health care provider. What should I know about heart disease and stroke? Heart disease, heart attack, and stroke become more likely as you age. This may be due, in part, to the hormonal changes that your body experiences during menopause. These can affect how your body processes dietary fats, triglycerides, and cholesterol. Heart attack and stroke are both medical emergencies. There are many things that you can do to help prevent heart disease  and stroke:  Have your blood pressure checked at least every 1-2 years. High blood pressure causes heart disease and increases the risk of stroke.  If you are 79-72 years old, ask your health care provider if you should take aspirin to prevent a heart attack or a stroke.  Do not use any tobacco products, including cigarettes, chewing tobacco, or electronic cigarettes. If you need help quitting, ask your health care provider.  It is important to eat a healthy diet and maintain a healthy weight. ? Be sure to include plenty of vegetables, fruits, low-fat dairy products, and lean protein. ? Avoid eating foods that are high in solid fats, added sugars, or salt (sodium).  Get regular exercise. This is one of the most important things that you can do for your health. ? Try to exercise for at least 150 minutes each week. The type of exercise that you do should increase your heart rate and make you sweat. This is known as moderate-intensity exercise. ? Try to do strengthening exercises at least twice each week. Do these in addition to the moderate-intensity exercise.  Know your numbers.Ask your health care provider to check your cholesterol and your blood glucose. Continue to have your blood tested as directed by your health care provider.  What should I know about cancer screening? There are several types of cancer. Take the following steps to reduce your risk and to catch any cancer development as early as possible. Breast Cancer  Practice breast self-awareness. ? This means understanding how your breasts normally appear and feel. ? It also means doing regular breast self-exams. Let your health care provider know about any changes, no matter how small.  If you are 40 or  older, have a clinician do a breast exam (clinical breast exam or CBE) every year. Depending on your age, family history, and medical history, it may be recommended that you also have a yearly breast X-ray (mammogram).  If you  have a family history of breast cancer, talk with your health care provider about genetic screening.  If you are at high risk for breast cancer, talk with your health care provider about having an MRI and a mammogram every year.  Breast cancer (BRCA) gene test is recommended for women who have family members with BRCA-related cancers. Results of the assessment will determine the need for genetic counseling and BRCA1 and for BRCA2 testing. BRCA-related cancers include these types: ? Breast. This occurs in males or females. ? Ovarian. ? Tubal. This may also be called fallopian tube cancer. ? Cancer of the abdominal or pelvic lining (peritoneal cancer). ? Prostate. ? Pancreatic. Cervical, Uterine, and Ovarian Cancer Your health care provider may recommend that you be screened regularly for cancer of the pelvic organs. These include your ovaries, uterus, and vagina. This screening involves a pelvic exam, which includes checking for microscopic changes to the surface of your cervix (Pap test).  For women ages 21-65, health care providers may recommend a pelvic exam and a Pap test every three years. For women ages 39-65, they may recommend the Pap test and pelvic exam, combined with testing for human papilloma virus (HPV), every five years. Some types of HPV increase your risk of cervical cancer. Testing for HPV may also be done on women of any age who have unclear Pap test results.  Other health care providers may not recommend any screening for nonpregnant women who are considered low risk for pelvic cancer and have no symptoms. Ask your health care provider if a screening pelvic exam is right for you.  If you have had past treatment for cervical cancer or a condition that could lead to cancer, you need Pap tests and screening for cancer for at least 20 years after your treatment. If Pap tests have been discontinued for you, your risk factors (such as having a new sexual partner) need to be reassessed  to determine if you should start having screenings again. Some women have medical problems that increase the chance of getting cervical cancer. In these cases, your health care provider may recommend that you have screening and Pap tests more often.  If you have a family history of uterine cancer or ovarian cancer, talk with your health care provider about genetic screening.  If you have vaginal bleeding after reaching menopause, tell your health care provider.  There are currently no reliable tests available to screen for ovarian cancer. Lung Cancer Lung cancer screening is recommended for adults 57-50 years old who are at high risk for lung cancer because of a history of smoking. A yearly low-dose CT scan of the lungs is recommended if you:  Currently smoke.  Have a history of at least 30 pack-years of smoking and you currently smoke or have quit within the past 15 years. A pack-year is smoking an average of one pack of cigarettes per day for one year. Yearly screening should:  Continue until it has been 15 years since you quit.  Stop if you develop a health problem that would prevent you from having lung cancer treatment. Colorectal Cancer  This type of cancer can be detected and can often be prevented.  Routine colorectal cancer screening usually begins at age 12 and continues through  age 75.  If you have risk factors for colon cancer, your health care provider may recommend that you be screened at an earlier age.  If you have a family history of colorectal cancer, talk with your health care provider about genetic screening.  Your health care provider may also recommend using home test kits to check for hidden blood in your stool.  A small camera at the end of a tube can be used to examine your colon directly (sigmoidoscopy or colonoscopy). This is done to check for the earliest forms of colorectal cancer.  Direct examination of the colon should be repeated every 5-10 years until  age 75. However, if early forms of precancerous polyps or small growths are found or if you have a family history or genetic risk for colorectal cancer, you may need to be screened more often. Skin Cancer  Check your skin from head to toe regularly.  Monitor any moles. Be sure to tell your health care provider: ? About any new moles or changes in moles, especially if there is a change in a mole's shape or color. ? If you have a mole that is larger than the size of a pencil eraser.  If any of your family members has a history of skin cancer, especially at a young age, talk with your health care provider about genetic screening.  Always use sunscreen. Apply sunscreen liberally and repeatedly throughout the day.  Whenever you are outside, protect yourself by wearing long sleeves, pants, a wide-brimmed hat, and sunglasses. What should I know about osteoporosis? Osteoporosis is a condition in which bone destruction happens more quickly than new bone creation. After menopause, you may be at an increased risk for osteoporosis. To help prevent osteoporosis or the bone fractures that can happen because of osteoporosis, the following is recommended:  If you are 19-50 years old, get at least 1,000 mg of calcium and at least 600 mg of vitamin D per day.  If you are older than age 50 but younger than age 70, get at least 1,200 mg of calcium and at least 600 mg of vitamin D per day.  If you are older than age 70, get at least 1,200 mg of calcium and at least 800 mg of vitamin D per day. Smoking and excessive alcohol intake increase the risk of osteoporosis. Eat foods that are rich in calcium and vitamin D, and do weight-bearing exercises several times each week as directed by your health care provider. What should I know about how menopause affects my mental health? Depression may occur at any age, but it is more common as you become older. Common symptoms of depression include:  Low or sad  mood.  Changes in sleep patterns.  Changes in appetite or eating patterns.  Feeling an overall lack of motivation or enjoyment of activities that you previously enjoyed.  Frequent crying spells. Talk with your health care provider if you think that you are experiencing depression. What should I know about immunizations? It is important that you get and maintain your immunizations. These include:  Tetanus, diphtheria, and pertussis (Tdap) booster vaccine.  Influenza every year before the flu season begins.  Pneumonia vaccine.  Shingles vaccine. Your health care provider may also recommend other immunizations. This information is not intended to replace advice given to you by your health care provider. Make sure you discuss any questions you have with your health care provider. Document Released: 11/26/2005 Document Revised: 04/23/2016 Document Reviewed: 07/08/2015 Elsevier Interactive Patient Education    2019 [place].

## 2018-12-16 LAB — CBC WITH DIFFERENTIAL/PLATELET
Basophils Absolute: 0.1 10*3/uL (ref 0.0–0.2)
Basos: 1 %
EOS (ABSOLUTE): 0.1 10*3/uL (ref 0.0–0.4)
Eos: 1 %
HEMOGLOBIN: 12.9 g/dL (ref 11.1–15.9)
Hematocrit: 40.2 % (ref 34.0–46.6)
IMMATURE GRANS (ABS): 0 10*3/uL (ref 0.0–0.1)
IMMATURE GRANULOCYTES: 0 %
LYMPHS: 31 %
Lymphocytes Absolute: 2.7 10*3/uL (ref 0.7–3.1)
MCH: 28.5 pg (ref 26.6–33.0)
MCHC: 32.1 g/dL (ref 31.5–35.7)
MCV: 89 fL (ref 79–97)
MONOCYTES: 8 %
Monocytes Absolute: 0.7 10*3/uL (ref 0.1–0.9)
Neutrophils Absolute: 5.3 10*3/uL (ref 1.4–7.0)
Neutrophils: 59 %
Platelets: 345 10*3/uL (ref 150–450)
RBC: 4.52 x10E6/uL (ref 3.77–5.28)
RDW: 12.8 % (ref 11.7–15.4)
WBC: 9 10*3/uL (ref 3.4–10.8)

## 2018-12-16 LAB — TSH: TSH: 2.09 u[IU]/mL (ref 0.450–4.500)

## 2018-12-16 LAB — COMPREHENSIVE METABOLIC PANEL
ALBUMIN: 3.9 g/dL (ref 3.8–4.8)
ALT: 18 IU/L (ref 0–32)
AST: 16 IU/L (ref 0–40)
Albumin/Globulin Ratio: 1.4 (ref 1.2–2.2)
Alkaline Phosphatase: 80 IU/L (ref 39–117)
BUN / CREAT RATIO: 20 (ref 12–28)
BUN: 16 mg/dL (ref 8–27)
Bilirubin Total: 0.3 mg/dL (ref 0.0–1.2)
CO2: 21 mmol/L (ref 20–29)
CREATININE: 0.8 mg/dL (ref 0.57–1.00)
Calcium: 9.3 mg/dL (ref 8.7–10.3)
Chloride: 101 mmol/L (ref 96–106)
GFR calc non Af Amer: 80 mL/min/{1.73_m2} (ref >59)
GFR, EST AFRICAN AMERICAN: 92 mL/min/{1.73_m2} (ref >59)
GLUCOSE: 328 mg/dL — AB (ref 65–99)
Globulin, Total: 2.7 g/dL (ref 1.5–4.5)
Potassium: 4.4 mmol/L (ref 3.5–5.2)
Sodium: 138 mmol/L (ref 134–144)
TOTAL PROTEIN: 6.6 g/dL (ref 6.0–8.5)

## 2018-12-16 LAB — HEMOGLOBIN A1C
Est. average glucose Bld gHb Est-mCnc: 346 mg/dL
HEMOGLOBIN A1C: 13.7 % — AB (ref 4.8–5.6)

## 2018-12-16 LAB — LIPID PANEL
CHOLESTEROL TOTAL: 159 mg/dL (ref 100–199)
Chol/HDL Ratio: 3.2 ratio (ref 0.0–4.4)
HDL: 49 mg/dL (ref >39)
LDL CALC: 94 mg/dL (ref 0–99)
TRIGLYCERIDES: 81 mg/dL (ref 0–149)
VLDL Cholesterol Cal: 16 mg/dL (ref 5–40)

## 2018-12-16 LAB — HIV ANTIBODY (ROUTINE TESTING W REFLEX): HIV Screen 4th Generation wRfx: NONREACTIVE

## 2018-12-20 ENCOUNTER — Telehealth: Payer: Self-pay

## 2018-12-20 NOTE — Telephone Encounter (Signed)
Patient advised as below.  

## 2018-12-20 NOTE — Telephone Encounter (Signed)
-----   Message from Mar Daring, [place] sent at 12/19/2018  5:34 PM EST ----- Blood count is normal. Kidney and liver function is normal. A1c is uncontrolled at 13.7. Recommend appointment to discuss medication options. Cholesterol is normal. Thyroid is normal.

## 2018-12-25 ENCOUNTER — Ambulatory Visit: Payer: Self-pay | Admitting: Physician Assistant

## 2018-12-26 NOTE — Progress Notes (Signed)
     Patient: Shari Lee Female    DOB: 05/11/1956   63 y.o.   MRN: [id number] Visit Date: 12/27/2018  Today's Provider: Jennifer M Burnette, PA-C   Chief Complaint  Patient presents with  . Follow-up    diabetes   Subjective:    I, Sulibeya S. Dimas, CMA, am acting as a scribe for Jennifer Burnette, PA-C.  HPI  Follow up for diabetes  The patient was last seen for this 2 weeks ago. Changes made at last visit include check a1c. A1c is uncontrolled at 13.7. Recommend appointment to discuss medication.  She reports excellent compliance with treatment. She feels that condition is Unchanged. She is not having side effects.   Wt Readings from Last 3 Encounters:  12/27/18 194 lb (88 kg)  12/15/18 192 lb (87.1 kg)  12/13/18 192 lb 9.6 oz (87.4 kg)   Lab Results  Component Value Date   HGBA1C 14.0 (A) 12/27/2018    ------------------------------------------------------------------------------------   Allergies  Allergen Reactions  . Amoxicillin-Pot Clavulanate   . Levsin  [Hyoscyamine]     Other reaction(s): Dizzyness  . Penicillins   . Metformin And Related Diarrhea     Current Outpatient Medications:  .  amLODipine-benazepril (LOTREL) 5-20 MG capsule, TAKE 1 CAPSULE BY MOUTH ONCE DAILY, Disp: 90 capsule, Rfl: 0 .  atenolol (TENORMIN) 50 MG tablet, TAKE ONE TABLET BY MOUTH ONCE DAILY, Disp: 90 tablet, Rfl: 3 .  fluconazole (DIFLUCAN) 150 MG tablet, TAKE 1 TABLET BY MOUTH ONCE FOR 1 DOSE. MAY REPEAT IN 72 HOURS IF NEEDED, Disp: 3 tablet, Rfl: 2 .  glucose blood (ONETOUCH VERIO) test strip, Check blood sugar up to 2 times a day., Disp: 300 each, Rfl: 3 .  glyBURIDE (DIABETA) 2.5 MG tablet, TAKE ONE TABLET BY MOUTH TWICE DAILY, Disp: 180 tablet, Rfl: 3 .  Lancets (ONETOUCH ULTRASOFT) lancets, Check blood sugar up two times a day., Disp: 300 each, Rfl: 3 .  nystatin cream (MYCOSTATIN), Apply 1 application topically 2 (two) times daily., Disp: 30 g, Rfl: 0 .   fluticasone (FLONASE) 50 MCG/ACT nasal spray, Place 2 sprays into both nostrils daily. (Patient not taking: Reported on 12/13/2018), Disp: 16 g, Rfl: 6  Review of Systems  Constitutional: Negative.   Eyes: Negative.   Respiratory: Negative.   Cardiovascular: Negative.   Endocrine: Negative.   Neurological: Negative.     Social History   Tobacco Use  . Smoking status: Never Smoker  . Smokeless tobacco: Never Used  Substance Use Topics  . Alcohol use: No    Alcohol/week: 0.0 standard drinks      Objective:   BP 131/83 (BP Location: Left Arm, Patient Position: Sitting, Cuff Size: Normal)   Pulse 94   Temp 98 F (36.7 C) (Oral)   Resp 16   Ht 5' 2" (1.575 m)   Wt 194 lb (88 kg)   BMI 35.48 kg/m  Vitals:   12/27/18 0714  BP: 131/83  Pulse: 94  Resp: 16  Temp: 98 F (36.7 C)  TempSrc: Oral  Weight: 194 lb (88 kg)  Height: 5' 2" (1.575 m)     Physical Exam Vitals signs reviewed.  Constitutional:      General: She is not in acute distress.    Appearance: Normal appearance. She is well-developed. She is obese. She is not diaphoretic.  Neck:     Musculoskeletal: Normal range of motion and neck supple.     Thyroid: No thyromegaly.       Vascular: No JVD.     Trachea: No tracheal deviation.  Cardiovascular:     Rate and Rhythm: Normal rate and regular rhythm.     Pulses: Normal pulses.     Heart sounds: Normal heart sounds. No murmur. No friction rub. No gallop.   Pulmonary:     Effort: Pulmonary effort is normal. No respiratory distress.     Breath sounds: Normal breath sounds. No wheezing or rales.  Lymphadenopathy:     Cervical: No cervical adenopathy.  Skin:    General: Skin is warm and dry.     Capillary Refill: Capillary refill takes less than 2 seconds.  Neurological:     Mental Status: She is alert.  Psychiatric:        Mood and Affect: Mood normal.        Behavior: Behavior normal.        Thought Content: Thought content normal.        Judgment:  Judgment normal.         Assessment & Plan    1. Type 2 diabetes mellitus with hyperglycemia, without long-term current use of insulin (HCC) A1c up to 13.7 in lab (14.0 today in the office). Patient intolerant to metformin. Has had severe yeast infections on Jardiance (still active we are trying to calm down now). On Glyburide 2.90m BID. Will add Ozempic as below. First injection done today in the office without complication. I will see her back in 4 weeks to recheck.  - POCT glycosylated hemoglobin (Hb A1C) - glucose blood (ONETOUCH VERIO) test strip; Check blood sugar up to 2 times a day.  Dispense: 300 each; Refill: 3 - Blood Glucose Monitoring Suppl (ONETOUCH VERIO) w/Device KIT; To check blood sugar once or twice daily  Dispense: 1 kit; Refill: 0 - Lancets (ONETOUCH ULTRASOFT) lancets; Check blood sugar up two times a day.  Dispense: 300 each; Refill: 3 - Semaglutide,0.25 or 0.5MG/DOS, (OZEMPIC, 0.25 OR 0.5 MG/DOSE,) 2 MG/1.5ML SOPN; Inject 0.5 mg into the skin once a week.  Dispense: 1 pen; Refill: 4 - Insulin Pen Needle (NOVOFINE PLUS) 32G X 4 MM MISC; To use with ozempic injections  Dispense: 50 each; Refill: 0     JMar Daring PA-C  [facility]

## 2018-12-27 ENCOUNTER — Encounter: Payer: Self-pay | Admitting: Physician Assistant

## 2018-12-27 ENCOUNTER — Ambulatory Visit (INDEPENDENT_AMBULATORY_CARE_PROVIDER_SITE_OTHER): Payer: BLUE CROSS/BLUE SHIELD | Admitting: Physician Assistant

## 2018-12-27 VITALS — BP 131/83 | HR 94 | Temp 98.0°F | Resp 16 | Ht 62.0 in | Wt 194.0 lb

## 2018-12-27 DIAGNOSIS — E1165 Type 2 diabetes mellitus with hyperglycemia: Secondary | ICD-10-CM

## 2018-12-27 LAB — POCT GLYCOSYLATED HEMOGLOBIN (HGB A1C): HEMOGLOBIN A1C: 14 % — AB (ref 4.0–5.6)

## 2018-12-27 MED ORDER — GLUCOSE BLOOD VI STRP: ORAL_STRIP | 3 refills | Status: DC

## 2018-12-27 MED ORDER — INSULIN PEN NEEDLE 32G X 4 MM MISC: 0 refills | Status: DC

## 2018-12-27 MED ORDER — SEMAGLUTIDE(0.25 OR 0.5MG/DOS) 2 MG/1.5ML ~~LOC~~ SOPN: 0.5000 mg | PEN_INJECTOR | SUBCUTANEOUS | 4 refills | Status: DC

## 2018-12-27 MED ORDER — ONETOUCH ULTRASOFT LANCETS MISC: 3 refills | Status: DC

## 2018-12-27 MED ORDER — ONETOUCH VERIO W/DEVICE KIT: PACK | 0 refills | Status: DC

## 2018-12-27 NOTE — Patient Instructions (Signed)
Semaglutide injection solution  What is this medicine?  SEMAGLUTIDE (Sem a GLOO tide) is used to improve blood sugar control in adults with type 2 diabetes. This medicine may be used with other diabetes medicines.  This medicine may be used for other purposes; ask your health care provider or pharmacist if you have questions.  COMMON BRAND NAME(S): OZEMPIC  What should I tell my health care provider before I take this medicine?  They need to know if you have any of these conditions:  -endocrine tumors (MEN 2) or if someone in your family had these tumors  -eye disease, vision problems  -history of pancreatitis  -kidney disease  -stomach problems  -thyroid cancer or if someone in your family had thyroid cancer  -an unusual or allergic reaction to semaglutide, other medicines, foods, dyes, or preservatives  -pregnant or trying to get pregnant  -breast-feeding  How should I use this medicine?  This medicine is for injection under the skin of your upper leg (thigh), stomach area, or upper arm. It is given once every week (every 7 days). You will be taught how to prepare and give this medicine. Use exactly as directed. Take your medicine at regular intervals. Do not take it more often than directed.  If you use this medicine with insulin, you should inject this medicine and the insulin separately. Do not mix them together. Do not give the injections right next to each other. Change (rotate) injection sites with each injection.  It is important that you put your used needles and syringes in a special sharps container. Do not put them in a trash can. If you do not have a sharps container, call your pharmacist or healthcare provider to get one.  A special MedGuide will be given to you by the pharmacist with each prescription and refill. Be sure to read this information carefully each time.  Talk to your pediatrician regarding the use of this medicine in children. Special care may be needed.  Overdosage: If you think you  have taken too much of this medicine contact a poison control center or emergency room at once.  NOTE: This medicine is only for you. Do not share this medicine with others.  What if I miss a dose?  If you miss a dose, take it as soon as you can within 5 days after the missed dose. Then take your next dose at your regular weekly time. If it has been longer than 5 days after the missed dose, do not take the missed dose. Take the next dose at your regular time. Do not take double or extra doses. If you have questions about a missed dose, contact your health care provider for advice.  What may interact with this medicine?  -other medicines for diabetes  Many medications may cause changes in blood sugar, these include:  -alcohol containing beverages  -antiviral medicines for HIV or AIDS  -aspirin and aspirin-like drugs  -certain medicines for blood pressure, heart disease, irregular heart beat  -chromium  -diuretics  -female hormones, such as estrogens or progestins, birth control pills  -fenofibrate  -gemfibrozil  -isoniazid  -lanreotide  -female hormones or anabolic steroids  -MAOIs like Carbex, Eldepryl, Marplan, Nardil, and Parnate  -medicines for weight loss  -medicines for allergies, asthma, cold, or cough  -medicines for depression, anxiety, or psychotic disturbances  -niacin  -nicotine  -NSAIDs, medicines for pain and inflammation, like ibuprofen or naproxen  -octreotide  -pasireotide  -pentamidine  -phenytoin  -probenecid  -quinolone   antibiotics such as ciprofloxacin, levofloxacin, ofloxacin  -some herbal dietary supplements  -steroid medicines such as prednisone or cortisone  -sulfamethoxazole; trimethoprim  -thyroid hormones  Some medications can hide the warning symptoms of low blood sugar (hypoglycemia). You may need to monitor your blood sugar more closely if you are taking one of these medications. These include:  -beta-blockers, often used for high blood pressure or heart problems (examples include  atenolol, metoprolol, propranolol)  -clonidine  -guanethidine  -reserpine  This list may not describe all possible interactions. Give your health care provider a list of all the medicines, herbs, non-prescription drugs, or dietary supplements you use. Also tell them if you smoke, drink alcohol, or use illegal drugs. Some items may interact with your medicine.  What should I watch for while using this medicine?  Visit your doctor or health care professional for regular checks on your progress.  Drink plenty of fluids while taking this medicine. Check with your doctor or health care professional if you get an attack of severe diarrhea, nausea, and vomiting. The loss of too much body fluid can make it dangerous for you to take this medicine.  A test called the HbA1C (A1C) will be monitored. This is a simple blood test. It measures your blood sugar control over the last 2 to 3 months. You will receive this test every 3 to 6 months.  Learn how to check your blood sugar. Learn the symptoms of low and high blood sugar and how to manage them.  Always carry a quick-source of sugar with you in case you have symptoms of low blood sugar. Examples include hard sugar candy or glucose tablets. Make sure others know that you can choke if you eat or drink when you develop serious symptoms of low blood sugar, such as seizures or unconsciousness. They must get medical help at once.  Tell your doctor or health care professional if you have high blood sugar. You might need to change the dose of your medicine. If you are sick or exercising more than usual, you might need to change the dose of your medicine.  Do not skip meals. Ask your doctor or health care professional if you should avoid alcohol. Many nonprescription cough and cold products contain sugar or alcohol. These can affect blood sugar.  Pens should never be shared. Even if the needle is changed, sharing may result in passing of viruses like hepatitis or HIV.  Wear a medical  ID bracelet or chain, and carry a card that describes your disease and details of your medicine and dosage times.  What side effects may I notice from receiving this medicine?  Side effects that you should report to your doctor or health care professional as soon as possible:  -allergic reactions like skin rash, itching or hives, swelling of the face, lips, or tongue  -breathing problems  -changes in vision  -diarrhea that continues or is severe  -lump or swelling on the neck  -severe nausea  -signs and symptoms of infection like fever or chills; cough; sore throat; pain or trouble passing urine  -signs and symptoms of low blood sugar such as feeling anxious, confusion, dizziness, increased hunger, unusually weak or tired, sweating, shakiness, cold, irritable, headache, blurred vision, fast heartbeat, loss of consciousness  -signs and symptoms of kidney injury like trouble passing urine or change in the amount of urine  -trouble swallowing  -unusual stomach upset or pain  -vomiting  Side effects that usually do not require medical attention (report   to your doctor or health care professional if they continue or are bothersome):  -constipation  -diarrhea  -nausea  -pain, redness, or irritation at site where injected  -stomach upset  This list may not describe all possible side effects. Call your doctor for medical advice about side effects. You may report side effects to FDA at 1-800-FDA-1088.  Where should I keep my medicine?  Keep out of the reach of children.  Store unopened pens in a refrigerator between 2 and 8 degrees C (36 and 46 degrees F). Do not freeze. Protect from light and heat. After you first use the pen, it can be stored for 56 days at room temperature between 15 and 30 degrees C (59 and 86 degrees F) or in a refrigerator. Throw away your used pen after 56 days or after the expiration date, whichever comes first.  Do not store your pen with the needle attached. If the needle is left on, medicine may  leak from the pen.  NOTE: This sheet is a summary. It may not cover all possible information. If you have questions about this medicine, talk to your doctor, pharmacist, or health care provider.   2019 Elsevier/Gold Standard (2016-10-21 14:43:35)

## 2019-01-13 ENCOUNTER — Other Ambulatory Visit: Payer: Self-pay | Admitting: Physician Assistant

## 2019-01-13 DIAGNOSIS — E1100 Type 2 diabetes mellitus with hyperosmolarity without nonketotic hyperglycemic-hyperosmolar coma (NKHHC): Secondary | ICD-10-CM

## 2019-01-13 DIAGNOSIS — I10 Essential (primary) hypertension: Secondary | ICD-10-CM

## 2019-01-16 ENCOUNTER — Other Ambulatory Visit: Payer: Self-pay | Admitting: Physician Assistant

## 2019-01-16 DIAGNOSIS — I10 Essential (primary) hypertension: Secondary | ICD-10-CM

## 2019-01-18 NOTE — Telephone Encounter (Signed)
Please Review

## 2019-01-31 ENCOUNTER — Ambulatory Visit (INDEPENDENT_AMBULATORY_CARE_PROVIDER_SITE_OTHER): Payer: BLUE CROSS/BLUE SHIELD | Admitting: Physician Assistant

## 2019-01-31 ENCOUNTER — Encounter: Payer: Self-pay | Admitting: Physician Assistant

## 2019-01-31 DIAGNOSIS — E1165 Type 2 diabetes mellitus with hyperglycemia: Secondary | ICD-10-CM

## 2019-01-31 LAB — POCT GLYCOSYLATED HEMOGLOBIN (HGB A1C)
EOS (ABSOLUTE): 275
Hemoglobin A1C: 11.2 % — AB (ref 4.0–5.6)

## 2019-01-31 MED ORDER — INSULIN PEN NEEDLE 32G X 4 MM MISC: 3 refills | Status: AC

## 2019-01-31 NOTE — Progress Notes (Signed)
Virtual Visit via Video Note  I connected with Guss Bunde on 01/31/19 at 11:20 AM EDT by a video enabled telemedicine application and verified that I am speaking with the correct person using two identifiers.   I discussed the limitations of evaluation and management by telemedicine and the availability of in person appointments. The patient expressed understanding and agreed to proceed.  Mar Daring, PA-C   Patient: Shari Lee Female    DOB: January 24, 1956   63 y.o.   MRN: [id number] Visit Date: 01/31/2019  Today's Provider: Mar Daring, PA-C   Chief Complaint  Patient presents with  . Follow-up    4 weeks follow-up DM   Subjective:     HPI  Patient is doing an e-visit with provider today for her 4 week follow-up DM. Ozempic was added at that office visit. She has started intermittent fasting, eating 8 hrs per day and is also walking. She is trying to walk 5 miles per day. She has lost 10 pounds since starting Ozempic and lifestyle changes.     Allergies  Allergen Reactions  . Amoxicillin-Pot Clavulanate   . Levsin  [Hyoscyamine]     Other reaction(s): Dizzyness  . Penicillins   . Metformin And Related Diarrhea     Current Outpatient Medications:  .  amLODipine-benazepril (LOTREL) 5-20 MG capsule, Take 1 capsule by mouth once daily, Disp: 90 capsule, Rfl: 1 .  atenolol (TENORMIN) 50 MG tablet, Take 1 tablet by mouth once daily, Disp: 90 tablet, Rfl: 1 .  Blood Glucose Monitoring Suppl (ONETOUCH VERIO) w/Device KIT, To check blood sugar once or twice daily, Disp: 1 kit, Rfl: 0 .  fluconazole (DIFLUCAN) 150 MG tablet, TAKE 1 TABLET BY MOUTH ONCE FOR 1 DOSE. MAY REPEAT IN 72 HOURS IF NEEDED, Disp: 3 tablet, Rfl: 2 .  glucose blood (ONETOUCH VERIO) test strip, Check blood sugar up to 2 times a day., Disp: 300 each, Rfl: 3 .  glyBURIDE (DIABETA) 2.5 MG tablet, Take 1 tablet by mouth twice daily, Disp: 180 tablet, Rfl: 1 .  Insulin Pen Needle (NOVOFINE  PLUS) 32G X 4 MM MISC, To use with ozempic injections, Disp: 50 each, Rfl: 0 .  Lancets (ONETOUCH ULTRASOFT) lancets, Check blood sugar up two times a day., Disp: 300 each, Rfl: 3 .  nystatin cream (MYCOSTATIN), Apply 1 application topically 2 (two) times daily., Disp: 30 g, Rfl: 0 .  Semaglutide,0.25 or 0.5MG/DOS, (OZEMPIC, 0.25 OR 0.5 MG/DOSE,) 2 MG/1.5ML SOPN, Inject 0.5 mg into the skin once a week., Disp: 1 pen, Rfl: 4 .  fluticasone (FLONASE) 50 MCG/ACT nasal spray, Place 2 sprays into both nostrils daily. (Patient not taking: Reported on 12/13/2018), Disp: 16 g, Rfl: 6  Review of Systems  Constitutional: Negative.   Respiratory: Negative.   Cardiovascular: Negative.   Gastrointestinal: Negative.   Neurological: Negative.   Psychiatric/Behavioral: Negative.     Social History   Tobacco Use  . Smoking status: Never Smoker  . Smokeless tobacco: Never Used  Substance Use Topics  . Alcohol use: No    Alcohol/week: 0.0 standard drinks      Objective:   There were no vitals taken for this visit. There were no vitals filed for this visit.   Physical Exam Vitals signs reviewed.  Constitutional:      General: She is not in acute distress.    Appearance: Normal appearance. She is well-developed. She is not ill-appearing.  HENT:     Head: Normocephalic and atraumatic.  Neck:     Musculoskeletal: Normal range of motion and neck supple.  Pulmonary:     Effort: Pulmonary effort is normal. No respiratory distress.  Neurological:     Mental Status: She is alert.  Psychiatric:        Mood and Affect: Mood normal.        Behavior: Behavior normal.        Thought Content: Thought content normal.        Judgment: Judgment normal.         Assessment & Plan    1. Uncontrolled type 2 diabetes mellitus with hyperglycemia ([place]) Doing well! Lost 10 pounds. A1c down to 11.2 in just 4 weeks, had been 13.7. Continue Ozempic and glyburide at this time. Continue lifestyle  modifications. I will see her back in 3 months for A1c.  - POCT glycosylated hemoglobin (Hb A1C) - Insulin Pen Needle (NOVOFINE PLUS) 32G X 4 MM MISC; To use with ozempic injections  Dispense: 100 each; Refill: 3    I discussed the assessment and treatment plan with the patient. The patient was provided an opportunity to ask questions and all were answered. The patient agreed with the plan and demonstrated an understanding of the instructions.   The patient was advised to call back or seek an in-person evaluation if the symptoms worsen or if the condition fails to improve as anticipated.  I provided 14 minutes of non-face-to-face time during this encounter.   Mar Daring, PA-C  [facility]

## 2019-08-18 ENCOUNTER — Other Ambulatory Visit: Payer: Self-pay | Admitting: Physician Assistant

## 2019-08-18 DIAGNOSIS — I10 Essential (primary) hypertension: Secondary | ICD-10-CM

## 2019-08-18 DIAGNOSIS — E1100 Type 2 diabetes mellitus with hyperosmolarity without nonketotic hyperglycemic-hyperosmolar coma (NKHHC): Secondary | ICD-10-CM

## 2019-10-03 ENCOUNTER — Telehealth: Payer: Self-pay | Admitting: Physician Assistant

## 2019-10-03 ENCOUNTER — Other Ambulatory Visit: Payer: Self-pay | Admitting: Physician Assistant

## 2019-10-03 DIAGNOSIS — E1100 Type 2 diabetes mellitus with hyperosmolarity without nonketotic hyperglycemic-hyperosmolar coma (NKHHC): Secondary | ICD-10-CM

## 2019-10-03 DIAGNOSIS — E1165 Type 2 diabetes mellitus with hyperglycemia: Secondary | ICD-10-CM

## 2019-10-03 DIAGNOSIS — I10 Essential (primary) hypertension: Secondary | ICD-10-CM

## 2019-10-03 MED ORDER — CONTOUR NEXT TEST VI STRP: ORAL_STRIP | 12 refills | Status: DC

## 2019-10-03 MED ORDER — CARETOUCH SAFETY LANCETS 26G MISC: 1.0000 | Freq: Every day | 12 refills | Status: AC

## 2019-10-03 MED ORDER — CONTOUR NEXT USB MONITOR W/DEVICE KIT: 1.0000 | PACK | Freq: Every day | 0 refills | Status: AC

## 2019-10-03 NOTE — Telephone Encounter (Signed)
Changed to contour next

## 2019-10-03 NOTE — Telephone Encounter (Signed)
Pharmacy stated pt's insurance [employer] now requires PA for One touch Verio strips. If pt/PCP does not want to submit PA a new rx will be needed for a meter, lancets, strips for the Contour Next brand. Please advise.

## 2019-10-03 NOTE — Telephone Encounter (Signed)
From [facility] 

## 2020-01-03 ENCOUNTER — Other Ambulatory Visit: Payer: Self-pay

## 2020-01-03 ENCOUNTER — Ambulatory Visit (INDEPENDENT_AMBULATORY_CARE_PROVIDER_SITE_OTHER): Payer: Self-pay | Admitting: Physician Assistant

## 2020-01-03 ENCOUNTER — Encounter: Payer: Self-pay | Admitting: Physician Assistant

## 2020-01-03 VITALS — BP 133/86 | HR 97 | Temp 97.5°F | Resp 16 | Wt 190.0 lb

## 2020-01-03 DIAGNOSIS — K047 Periapical abscess without sinus: Secondary | ICD-10-CM

## 2020-01-03 MED ORDER — CLINDAMYCIN HCL 300 MG PO CAPS: 300.0000 mg | ORAL_CAPSULE | Freq: Two times a day (BID) | ORAL | 0 refills | Status: DC

## 2020-01-03 NOTE — Progress Notes (Signed)
Patient: Shari Lee Female    DOB: Jan 31, 1956   64 y.o.   MRN: [id number] Visit Date: 01/03/2020  Today's Provider: Mar Daring, PA-C   Chief Complaint  Patient presents with  . Dental Pain   Subjective:     HPI  Patient here for possible tooth infection. She reports that it hurts and swollen and throbbing pain.  She called her dentist and the soonest appointment that she was offered was April 1. She has been gargling with warm salt water.   Allergies  Allergen Reactions  . Amoxicillin-Pot Clavulanate   . Levsin  [Hyoscyamine]     Other reaction(s): Dizzyness  . Penicillins   . Metformin And Related Diarrhea     Current Outpatient Medications:  .  amLODipine-benazepril (LOTREL) 5-20 MG capsule, Take 1 capsule by mouth once daily, Disp: 90 capsule, Rfl: 0 .  atenolol (TENORMIN) 50 MG tablet, Take 1 tablet by mouth once daily, Disp: 90 tablet, Rfl: 0 .  Blood Glucose Monitoring Suppl (CONTOUR NEXT USB MONITOR) w/Device KIT, 1 kit by Does not apply route daily. To check blood sugar once daily., Disp: 1 kit, Rfl: 0 .  CareTouch Safety Lancets 26G MISC, 1 Device by Does not apply route daily. To check blood sugar once daily., Disp: 100 each, Rfl: 12 .  glucose blood (CONTOUR NEXT TEST) test strip, To check blood sugar once daily., Disp: 100 each, Rfl: 12 .  glyBURIDE (DIABETA) 2.5 MG tablet, Take 1 tablet by mouth twice daily, Disp: 180 tablet, Rfl: 0 .  Insulin Pen Needle (NOVOFINE PLUS) 32G X 4 MM MISC, To use with ozempic injections, Disp: 100 each, Rfl: 3 .  Semaglutide,0.25 or 0.5MG/DOS, (OZEMPIC, 0.25 OR 0.5 MG/DOSE,) 2 MG/1.5ML SOPN, Inject 0.5 mg into the skin once a week., Disp: 1 pen, Rfl: 4 .  fluconazole (DIFLUCAN) 150 MG tablet, TAKE 1 TABLET BY MOUTH ONCE FOR 1 DOSE. MAY REPEAT IN 72 HOURS IF NEEDED, Disp: 3 tablet, Rfl: 2 .  fluticasone (FLONASE) 50 MCG/ACT nasal spray, Place 2 sprays into both nostrils daily. (Patient not taking: Reported on  12/13/2018), Disp: 16 g, Rfl: 6 .  nystatin cream (MYCOSTATIN), Apply 1 application topically 2 (two) times daily., Disp: 30 g, Rfl: 0  Review of Systems  Constitutional: Negative for fever.  HENT: Positive for dental problem and mouth sores.   Respiratory: Negative.   Cardiovascular: Negative.   Gastrointestinal: Negative.     Social History   Tobacco Use  . Smoking status: Never Smoker  . Smokeless tobacco: Never Used  Substance Use Topics  . Alcohol use: No    Alcohol/week: 0.0 standard drinks      Objective:   BP 133/86 (BP Location: Left Arm, Patient Position: Sitting, Cuff Size: Large)   Pulse 97   Temp (!) 97.5 F (36.4 C) (Temporal)   Resp 16   Wt 190 lb (86.2 kg)   BMI 34.75 kg/m  Vitals:   01/03/20 1620  BP: 133/86  Pulse: 97  Resp: 16  Temp: (!) 97.5 F (36.4 C)  TempSrc: Temporal  Weight: 190 lb (86.2 kg)  Body mass index is 34.75 kg/m.   Physical Exam Vitals reviewed.  Constitutional:      General: She is not in acute distress.    Appearance: Normal appearance. She is well-developed. She is obese. She is not ill-appearing or diaphoretic.  HENT:     Head: Normocephalic and atraumatic.     Right Ear: Hearing  normal.     Left Ear: Hearing normal.     Nose: Nose normal.     Mouth/Throat:     Mouth: Mucous membranes are moist.     Dentition: Abnormal dentition. Gingival swelling present.     Pharynx: Oropharynx is clear. Uvula midline. No oropharyngeal exudate.     Comments: Patient has a cap on right front incisor and just under cap there is an area of darkening and then the gum between the right front incisor and the right lateral incisor is very swollen and red Eyes:     General: No scleral icterus.       Right eye: No discharge.        Left eye: No discharge.     Extraocular Movements: Extraocular movements intact.     Conjunctiva/sclera: Conjunctivae normal.     Pupils: Pupils are equal, round, and reactive to light.  Neck:     Thyroid:  No thyromegaly.     Trachea: No tracheal deviation.  Cardiovascular:     Rate and Rhythm: Normal rate and regular rhythm.     Heart sounds: Normal heart sounds. No murmur. No friction rub. No gallop.   Pulmonary:     Effort: Pulmonary effort is normal. No respiratory distress.     Breath sounds: Normal breath sounds. No stridor. No wheezing or rales.  Musculoskeletal:     Cervical back: Normal range of motion and neck supple.  Lymphadenopathy:     Cervical: No cervical adenopathy.  Skin:    General: Skin is warm and dry.  Neurological:     Mental Status: She is alert.      No results found for any visits on 01/03/20.     Assessment & Plan    1. Tooth abscess Clindamycin prescribed for infection. Continue salt water gargles. Call if not improving or worsening.  - clindamycin (CLEOCIN) 300 MG capsule; Take 1 capsule (300 mg total) by mouth in the morning and at bedtime.  Dispense: 14 capsule; Refill: 0     Mar Daring, PA-C  [facility]

## 2020-01-03 NOTE — Patient Instructions (Signed)
Dental Abscess  A dental abscess is a collection of pus in or around a tooth that results from an infection. An abscess can cause pain in the affected area as well as other symptoms. Treatment is important to help with symptoms and to prevent the infection from spreading. What are the causes? This condition is caused by a bacterial infection around the root of the tooth that involves the inner part of the tooth (pulp). It may result from:  Severe tooth decay.  Trauma to the tooth, such as a broken or chipped tooth, that allows bacteria to enter into the pulp.  Severe gum disease around a tooth. What increases the risk? This condition is more likely to develop in males. It is also more likely to develop in people who:  Have dental decay (cavities).  Eat sugary snacks between meals.  Use tobacco products.  Have diabetes.  Have a weakened disease-fighting system (immune system).  Do not brush and care for their teeth regularly. What are the signs or symptoms? Symptoms of this condition include:  Severe pain in and around the infected tooth.  Swelling and redness around the infected tooth, in the mouth, or in the face.  Tenderness.  Pus drainage.  Bad breath.  Bitter taste in the mouth.  Difficulty swallowing.  Difficulty opening the mouth.  Nausea.  Vomiting.  Chills.  Swollen neck glands.  Fever. How is this diagnosed? This condition is diagnosed based on:  Your symptoms and your medical and dental history.  An examination of the infected tooth. During the exam, your dentist may tap on the infected tooth. You may also have X-rays of the affected area. How is this treated? This condition is treated by getting rid of the infection. This may be done with:  Incision and drainage. This procedure is done by making an incision in the abscess to drain out the pus. Removing pus is the first priority in treating an abscess.  Antibiotic medicines. These may be used  in certain situations.  Antibacterial mouth rinse.  A root canal. This may be performed to save the tooth. Your dentist accesses the visible part of your tooth (crown) with a drill and removes any damaged pulp. Then the space is filled and sealed off.  Tooth extraction. The tooth is pulled out if it cannot be saved by other treatment. You may also receive treatment for pain, such as:  Acetaminophen or NSAIDs.  Gels that contain a numbing medicine.  An injection to block the pain near your nerve. Follow these instructions at home: Medicines  Take over-the-counter and prescription medicines only as told by your dentist.  If you were prescribed an antibiotic, take it as told by your dentist. Do not stop taking the antibiotic even if you start to feel better.  If you were prescribed a gel that contains a numbing medicine, use it exactly as told in the directions. Do not use these gels for children who are younger than 1 years of age.  Do not drive or use heavy machinery while taking prescription pain medicine. General instructions  Rinse out your mouth often with salt water to relieve pain or swelling. To make a salt-water mixture, completely dissolve -1 tsp of salt in 1 cup of warm water.  Eat a soft diet while your abscess is healing.  Drink enough fluid to keep your urine pale yellow.  Do not apply heat to the outside of your mouth.  Do not use any products that contain nicotine or  tobacco, such as cigarettes and e-cigarettes. If you need help quitting, ask your health care provider.  Keep all follow-up visits as told by your dentist. This is important. How is this prevented?  Brush your teeth every morning and night with fluoride toothpaste. Floss one time each day.  Get regularly scheduled dental cleanings.  Consider having a dental sealant applied on teeth that have deep holes (caries).  Drink fluoridated water regularly. This includes most tap water. Check the label  on bottled water to see if it contains fluoride.  Drink water instead of sugary drinks.  Eat healthy meals and snacks.  Wear a mouth guard or face shield to protect your teeth while playing sports. Contact a health care provider if:  Your pain is worse and is not helped by medicine. Get help right away if:  You have a fever or chills.  Your symptoms suddenly get worse.  You have a very bad headache.  You have problems breathing or swallowing.  You have trouble opening your mouth.  You have swelling in your neck or around your eye. Summary  A dental abscess is a collection of pus in or around a tooth that results from an infection.  A dental abscess may result from severe tooth decay, trauma to the tooth, or severe gum disease around a tooth.  Symptoms include severe pain, swelling, redness, and drainage of pus in and around the infected tooth.  The first priority in treating a dental abscess is to drain out the pus. Treatment may also involve removing damage inside the tooth (root canal) or pulling out (extracting) the tooth. This information is not intended to replace advice given to you by your health care provider. Make sure you discuss any questions you have with your health care provider. Document Revised: 09/16/2017 Document Reviewed: 06/06/2017 Elsevier Patient Education  2020 [employer]  

## 2020-02-22 ENCOUNTER — Other Ambulatory Visit: Payer: Self-pay | Admitting: Physician Assistant

## 2020-02-22 DIAGNOSIS — I10 Essential (primary) hypertension: Secondary | ICD-10-CM

## 2020-02-22 DIAGNOSIS — E1100 Type 2 diabetes mellitus with hyperosmolarity without nonketotic hyperglycemic-hyperosmolar coma (NKHHC): Secondary | ICD-10-CM

## 2020-02-22 NOTE — Telephone Encounter (Signed)
[  Pharmacy Med Name: amLODIPine Besy-Benazepril HCl 5-20 MG Oral Capsule] is not on the preferred formulary for the patient's insurance plan. Please review for refill

## 2020-02-23 NOTE — Telephone Encounter (Signed)
Alternative medication is requested

## 2020-05-07 ENCOUNTER — Other Ambulatory Visit: Payer: Self-pay | Admitting: Physician Assistant

## 2020-05-07 DIAGNOSIS — K047 Periapical abscess without sinus: Secondary | ICD-10-CM

## 2020-05-07 NOTE — Telephone Encounter (Signed)
Requested medication (s) are due for refill today: no  Requested medication (s) are on the active medication list: yes  Last refill:  01/04/2020  Future visit scheduled: no  Notes to clinic: Medication not assigned to a protocol, review manually   Requested Prescriptions  Pending Prescriptions Disp Refills   clindamycin (CLEOCIN) 300 MG capsule [Pharmacy Med Name: Clindamycin HCl 300 MG Oral Capsule] 14 capsule 0    Sig: TAKE 1 CAPSULE BY MOUTH IN THE MORNING AND 1 CAPSULE AT BEDTIME      Off-Protocol Failed - 05/07/2020  9:23 AM      Failed - Medication not assigned to a protocol, review manually.      Passed - Valid encounter within last 12 months    Recent Outpatient Visits           4 months ago Tooth abscess   [facility] [place], [place], [place]   1 year ago Uncontrolled type 2 diabetes mellitus with hyperglycemia [facility])   [place], [place], [place]   1 year ago Type 2 diabetes mellitus with hyperglycemia, without long-term current use of insulin [facility])   [place], Clearn[place] Sorrel, [place]   1 year ago Encounter for annual physical exam   [facility] [place], [place], [place]   1 year ago Yeast infection   [facility], [place], [place]

## 2020-05-23 ENCOUNTER — Other Ambulatory Visit: Payer: Self-pay | Admitting: Physician Assistant

## 2020-05-23 DIAGNOSIS — E1100 Type 2 diabetes mellitus with hyperosmolarity without nonketotic hyperglycemic-hyperosmolar coma (NKHHC): Secondary | ICD-10-CM

## 2020-05-23 DIAGNOSIS — I10 Essential (primary) hypertension: Secondary | ICD-10-CM

## 2020-05-23 NOTE — Telephone Encounter (Signed)
Attempted to call patient to schedule appointment- overdue. Passes protocol for visit due to acute visit-left message to call office.

## 2020-08-31 ENCOUNTER — Other Ambulatory Visit: Payer: Self-pay | Admitting: Physician Assistant

## 2020-08-31 DIAGNOSIS — E1100 Type 2 diabetes mellitus with hyperosmolarity without nonketotic hyperglycemic-hyperosmolar coma (NKHHC): Secondary | ICD-10-CM

## 2020-08-31 DIAGNOSIS — I10 Essential (primary) hypertension: Secondary | ICD-10-CM

## 2020-08-31 NOTE — Telephone Encounter (Signed)
Called pt and LM on VM to call back to make appt. 30 day courtesy refills given to amlodipine and atenolol Requested Prescriptions  Pending Prescriptions Disp Refills  . amLODipine-benazepril (LOTREL) 5-20 MG capsule [Pharmacy Med Name: amLODIPine Besy-Benazepril HCl 5-20 MG Oral Capsule] 30 capsule 0    Sig: Take 1 capsule by mouth once daily     Cardiovascular: CCB + ACEI Combos Failed - 08/31/2020  6:24 PM      Failed - Cr in normal range and within 180 days    Creatinine, Ser  Date Value Ref Range Status  12/15/2018 0.80 0.57 - 1.00 mg/dL Final         Failed - K in normal range and within 180 days    Potassium  Date Value Ref Range Status  12/15/2018 4.4 3.5 - 5.2 mmol/L Final         Failed - Valid encounter within last 6 months    Recent Outpatient Visits          8 months ago Tooth abscess   [facility] [place], [place], [place]   1 year ago Uncontrolled type 2 diabetes mellitus with hyperglycemia [facility])   [place], [place], [place]   1 year ago Type 2 diabetes mellitus with hyperglycemia, without long-term current use of insulin [facility])   [facility] [place], Clearn[place], [place]   1 year ago Encounter for annual physical exam   [facility] [place], [place], [place]   1 year ago Yeast infection   [facility], [place], [place]             Passed - Patient is not pregnant      Passed - Last BP in normal range    BP Readings from Last 1 Encounters:  01/03/20 133/86         . atenolol (TENORMIN) 50 MG tablet [Pharmacy Med Name: Atenolol 50 MG Oral Tablet] 30 tablet 0    Sig: Take 1 tablet by mouth once daily     Cardiovascular:  Beta Blockers Failed - 08/31/2020  6:24 PM      Failed - Valid encounter within last 6 months    Recent Outpatient Visits          8 months ago Tooth abscess   [facility] [place], [place], [place]   1 year ago Uncontrolled  type 2 diabetes mellitus with hyperglycemia [facility])   [place], [place], [place]   1 year ago Type 2 diabetes mellitus with hyperglycemia, without long-term current use of insulin [facility])   [facility] [place], Clearnce Sor[place], [place]   1 year ago Encounter for annual physical exam   [place], [place]arn[place], [place]   1 year ago Yeast infection   [facility], [place], [place]             Passed - Last BP in normal range    BP Readings from Last 1 Encounters:  01/03/20 133/86         Passed - Last Heart Rate in normal range    Pulse Readings from Last 1 Encounters:  01/03/20 97         . glyBURIDE (DIABETA) 2.5 MG tablet [Pharmacy Med Name: glyBURIDE 2.5 MG Oral Tablet] 60 tablet     Sig: Take 1 tablet by mouth twice daily     Endocrinology:  Diabetes - Sulfonylureas Failed - 08/31/2020  6:24 PM      Failed - HBA1C is between 0 and 7.9 and within 180 days    Hemoglobin A1C  Date Value Ref Range Status  01/31/2019 11.2 (A) 4.0 - 5.6 % Final   Hgb A1c MFr Bld  Date Value Ref Range Status  12/15/2018 13.7 (H) 4.8 - 5.6 % Final    Comment:             Prediabetes: 5.7 - 6.4          Diabetes: >6.4          Glycemic control for adults with diabetes: <7.0          Failed - Valid encounter within last 6 months    Recent Outpatient Visits          8 months ago Tooth abscess   [facility] [place], [place], [place]   1 year ago Uncontrolled type 2 diabetes mellitus with hyperglycemia [facility])   [place], [place], [place]   1 year ago Type 2 diabetes mellitus with hyperglycemia, without long-term current use of insulin [facility])   [facility] [place], [place], [place]   1 year ago Encounter for annual physical exam   [place], [place]arn[place] Sor[place], [place]   1 year ago Yeast infection   [facility], [place], [place]

## 2020-08-31 NOTE — Telephone Encounter (Signed)
Requested medication (s) are due for refill today: yes  Requested medication (s) are on the active medication list: yes  Last refill:  05/23/20  Future visit scheduled: no- LM on VM to call back to schedule appt  Notes to clinic:  needs appt-2 months overdue/ overdue labwork   Requested Prescriptions  Pending Prescriptions Disp Refills   glyBURIDE (DIABETA) 2.5 MG tablet [Pharmacy Med Name: glyBURIDE 2.5 MG Oral Tablet] 60 tablet     Sig: Take 1 tablet by mouth twice daily      Endocrinology:  Diabetes - Sulfonylureas Failed - 08/31/2020  6:24 PM      Failed - HBA1C is between 0 and 7.9 and within 180 days    Hemoglobin A1C  Date Value Ref Range Status  01/31/2019 11.2 (A) 4.0 - 5.6 % Final   Hgb A1c MFr Bld  Date Value Ref Range Status  12/15/2018 13.7 (H) 4.8 - 5.6 % Final    Comment:             Prediabetes: 5.7 - 6.4          Diabetes: >6.4          Glycemic control for adults with diabetes: <7.0           Failed - Valid encounter within last 6 months    Recent Outpatient Visits           8 months ago Tooth abscess   [facility] [place], [place], [place]   1 year ago Uncontrolled type 2 diabetes mellitus with hyperglycemia [facility])   [place], [place], [place]   1 year ago Type 2 diabetes mellitus with hyperglycemia, without long-term current use of insulin [facility])   [place], [place], [place]   1 year ago Encounter for annual physical exam   [place], [place]arn[place], [place]   1 year ago Yeast infection   [place], [place], [place]               Signed Prescriptions Disp Refills   amLODipine-benazepril (LOTREL) 5-20 MG capsule 30 capsule 0    Sig: Take 1 capsule by mouth once daily      Cardiovascular: CCB + ACEI Combos Failed - 08/31/2020  6:24 PM      Failed - Cr in normal range and within 180 days    Creatinine, Ser  Date Value Ref Range  Status  12/15/2018 0.80 0.57 - 1.00 mg/dL Final          Failed - K in normal range and within 180 days    Potassium  Date Value Ref Range Status  12/15/2018 4.4 3.5 - 5.2 mmol/L Final          Failed - Valid encounter within last 6 months    Recent Outpatient Visits           8 months ago Tooth abscess   [facility] [place], [place], [place]   1 year ago Uncontrolled type 2 diabetes mellitus with hyperglycemia [facility])   [place], [place], [place]   1 year ago Type 2 diabetes mellitus with hyperglycemia, without long-term current use of insulin [facility])   [place], [place], [place]   1 year ago Encounter for annual physical exam   [place], [place], [place]   1 year ago Yeast infection   [facility], [place], [place]  Passed - Patient is not pregnant      Passed - Last BP in normal range    BP Readings from Last 1 Encounters:  01/03/20 133/86            atenolol (TENORMIN) 50 MG tablet 30 tablet 0    Sig: Take 1 tablet by mouth once daily      Cardiovascular:  Beta Blockers Failed - 08/31/2020  6:24 PM      Failed - Valid encounter within last 6 months    Recent Outpatient Visits           8 months ago Tooth abscess   [facility] [place], [place], [place]   1 year ago Uncontrolled type 2 diabetes mellitus with hyperglycemia [facility])   [place], [place], [place]   1 year ago Type 2 diabetes mellitus with hyperglycemia, without long-term current use of insulin [facility])   [facility] [place], [place], [place]   1 year ago Encounter for annual physical exam   [place], [place], [place]   1 year ago Yeast infection   [facility] [place], [place], [place]              Passed - Last BP in normal range    BP Readings from Last 1  Encounters:  01/03/20 133/86          Passed - Last Heart Rate in normal range    Pulse Readings from Last 1 Encounters:  01/03/20 97

## 2020-09-22 ENCOUNTER — Ambulatory Visit: Payer: Self-pay | Admitting: Physician Assistant

## 2020-09-30 NOTE — Progress Notes (Signed)
Established patient visit   Patient: Shari Lee   DOB: 09/04/1956   64 y.o. Female  MRN: [id number] Visit Date: 10/01/2020  Today's healthcare provider: Mar Daring, PA-C   Chief Complaint  Patient presents with  . Follow-up   Subjective    HPI  Declined Influenza vaccine and covid vaccine.  Diabetes Mellitus Type II, Follow-up  Lab Results  Component Value Date   HGBA1C 11.2 (A) 01/31/2019   HGBA1C 14.0 (A) 12/27/2018   HGBA1C 13.7 (H) 12/15/2018   Wt Readings from Last 3 Encounters:  10/01/20 184 lb 9.6 oz (83.7 kg)  01/03/20 190 lb (86.2 kg)  12/27/18 194 lb (88 kg)   Last seen for diabetes several months ago.  Management since then includes Continue Ozempic and glyburide at this time. Continue lifestyle modifications She reports fair compliance with treatment. She is not having side effects.  Symptoms: No fatigue No foot ulcerations  No appetite changes No nausea  No paresthesia of the feet  No polydipsia  No polyuria No visual disturbances   No vomiting     Home blood sugar records: not being checked  Episodes of hypoglycemia? No not being checked   Current insulin regiment: none Most Recent Eye Exam: not UTD 3 years ago was the last one. Current exercise: none Current diet habits: in general, an "unhealthy" diet  Pertinent Labs: Lab Results  Component Value Date   CHOL 159 12/15/2018   HDL 49 12/15/2018   LDLCALC 94 12/15/2018   TRIG 81 12/15/2018   CHOLHDL 3.2 12/15/2018   Lab Results  Component Value Date   NA 138 12/15/2018   K 4.4 12/15/2018   CREATININE 0.80 12/15/2018   GFRNONAA 80 12/15/2018   GFRAA 92 12/15/2018   GLUCOSE 328 (H) 12/15/2018     --------------------------------------------------------------------------------------------------- Hypertension, follow-up  BP Readings from Last 3 Encounters:  10/01/20 122/79  01/03/20 133/86  12/27/18 131/83   Wt Readings from Last 3 Encounters:  10/01/20 184 lb  9.6 oz (83.7 kg)  01/03/20 190 lb (86.2 kg)  12/27/18 194 lb (88 kg)     She was last seen for hypertension several months ago.  BP at that visit was 133/86.   She reports excellent compliance with treatment. She is not having side effects.  She is following a Regular diet. She is not exercising. She does not smoke.   Outside blood pressures are not being checked. Symptoms: No chest pain No chest pressure  No palpitations No syncope  No dyspnea No orthopnea  No paroxysmal nocturnal dyspnea No lower extremity edema   Pertinent labs: Lab Results  Component Value Date   CHOL 159 12/15/2018   HDL 49 12/15/2018   LDLCALC 94 12/15/2018   TRIG 81 12/15/2018   CHOLHDL 3.2 12/15/2018   Lab Results  Component Value Date   NA 138 12/15/2018   K 4.4 12/15/2018   CREATININE 0.80 12/15/2018   GFRNONAA 80 12/15/2018   GFRAA 92 12/15/2018   GLUCOSE 328 (H) 12/15/2018     The 10-year ASCVD risk score Mikey Bussing DC Jr., et al., 2013) is: 9.6%   ---------------------------------------------------------------------------------------------------  Patient Active Problem List   Diagnosis Date Noted  . Type 2 diabetes mellitus ([place]) 07/28/2015  . 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 Diabetes mellitus type 2, uncontrolled ([place]) 04/29/2015  . History of digestive disease 04/29/2015  . 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 Hypercholesterolemia without hypertriglyceridemia 12/11/2009  . Apnea, sleep 07/30/2008  . Colon, diverticulosis 06/07/2008  .  Essential (primary) hypertension 04/25/2006   Past Medical History:  Diagnosis Date  . Diabetes mellitus without complication ([place])   . Headache   . Hypertension    Past Surgical History:  Procedure Laterality Date  . ABDOMINAL HYSTERECTOMY  2006  . BREAST BIOPSY Left 2005   benign  . BREAST EXCISIONAL BIOPSY Left 2005?    -benign  . COLONOSCOPY WITH PROPOFOL N/A 08/13/2016   Procedure: COLONOSCOPY WITH PROPOFOL;  Surgeon:  Lucilla Lame, MD;  Location: [facility];  Service: Endoscopy;  Laterality: N/A;  . POLYPECTOMY  08/13/2016   Procedure: POLYPECTOMY;  Surgeon: Lucilla Lame, MD;  Location: [facility];  Service: Endoscopy;;       Medications: Outpatient Medications Prior to Visit  Medication Sig  . Blood Glucose Monitoring Suppl (CONTOUR NEXT USB MONITOR) w/Device KIT 1 kit by Does not apply route daily. To check blood sugar once daily.  Angelia Mould Safety Lancets 26G MISC 1 Device by Does not apply route daily. To check blood sugar once daily.  Marland Kitchen glucose blood (CONTOUR NEXT TEST) test strip To check blood sugar once daily.  . Insulin Pen Needle (NOVOFINE PLUS) 32G X 4 MM MISC To use with ozempic injections  . [DISCONTINUED] amLODipine-benazepril (LOTREL) 5-20 MG capsule Take 1 capsule by mouth once daily  . [DISCONTINUED] atenolol (TENORMIN) 50 MG tablet Take 1 tablet by mouth once daily  . [DISCONTINUED] clindamycin (CLEOCIN) 300 MG capsule Take 1 capsule (300 mg total) by mouth in the morning and at bedtime.  . [DISCONTINUED] glyBURIDE (DIABETA) 2.5 MG tablet Take 1 tablet by mouth twice daily  . [DISCONTINUED] Semaglutide,0.25 or 0.5MG/DOS, (OZEMPIC, 0.25 OR 0.5 MG/DOSE,) 2 MG/1.5ML SOPN Inject 0.5 mg into the skin once a week. (Patient not taking: Reported on 10/01/2020)   No facility-administered medications prior to visit.    Review of Systems  Constitutional: Negative for appetite change, chills, fatigue and fever.  HENT: Negative.   Eyes: Negative.   Respiratory: Negative for chest tightness and shortness of breath.   Cardiovascular: Negative for chest pain and palpitations.  Gastrointestinal: Negative for abdominal pain, nausea and vomiting.  Endocrine: Positive for polydipsia.  Genitourinary: Negative.   Musculoskeletal: Negative.   Skin: Negative.   Allergic/Immunologic: Negative.   Neurological: Negative for dizziness and weakness.  Hematological: Negative.    Psychiatric/Behavioral: Negative.     Last CBC Lab Results  Component Value Date   WBC 9.0 12/15/2018   HGB 12.9 12/15/2018   HCT 40.2 12/15/2018   MCV 89 12/15/2018   MCH 28.5 12/15/2018   RDW 12.8 12/15/2018   PLT 345 41/74/0814   Last metabolic panel Lab Results  Component Value Date   GLUCOSE 328 (H) 12/15/2018   NA 138 12/15/2018   K 4.4 12/15/2018   CL 101 12/15/2018   CO2 21 12/15/2018   BUN 16 12/15/2018   CREATININE 0.80 12/15/2018   GFRNONAA 80 12/15/2018   GFRAA 92 12/15/2018   CALCIUM 9.3 12/15/2018   PROT 6.6 12/15/2018   ALBUMIN 3.9 12/15/2018   LABGLOB 2.7 12/15/2018   AGRATIO 1.4 12/15/2018   BILITOT 0.3 12/15/2018   ALKPHOS 80 12/15/2018   AST 16 12/15/2018   ALT 18 12/15/2018   Last lipids Lab Results  Component Value Date   CHOL 159 12/15/2018   HDL 49 12/15/2018   LDLCALC 94 12/15/2018   TRIG 81 12/15/2018   CHOLHDL 3.2 12/15/2018   Last hemoglobin A1c Lab Results  Component Value Date   HGBA1C 11.2 (  A) 01/31/2019      Objective    BP 122/79 (BP Location: Left Arm, Patient Position: Sitting, Cuff Size: Large)   Pulse 71   Temp 98.6 F (37 C) (Oral)   Resp 16   Wt 184 lb 9.6 oz (83.7 kg)   BMI 33.76 kg/m     Physical Exam Vitals reviewed.  Constitutional:      General: She is not in acute distress.    Appearance: Normal appearance. She is well-developed and well-nourished. She is obese. She is not ill-appearing or diaphoretic.  HENT:     Head: Normocephalic and atraumatic.     Right Ear: External ear normal.     Left Ear: External ear normal.     Ears:     Comments: Hearing aids in place    Nose: Nose normal.     Mouth/Throat:     Mouth: Oropharynx is clear and moist. Mucous membranes are moist.     Pharynx: Oropharynx is clear. No oropharyngeal exudate or posterior oropharyngeal erythema.  Eyes:     General: No scleral icterus.       Right eye: No discharge.        Left eye: No discharge.     Extraocular  Movements: Extraocular movements intact and EOM normal.     Conjunctiva/sclera: Conjunctivae normal.     Pupils: Pupils are equal, round, and reactive to light.  Neck:     Thyroid: No thyromegaly.     Vascular: No carotid bruit or JVD.     Trachea: No tracheal deviation.  Cardiovascular:     Rate and Rhythm: Normal rate and regular rhythm.     Pulses: Normal pulses and intact distal pulses.     Heart sounds: Normal heart sounds. No murmur heard. No friction rub. No gallop.   Pulmonary:     Effort: Pulmonary effort is normal. No respiratory distress.     Breath sounds: Normal breath sounds. No wheezing or rales.  Chest:     Chest wall: No tenderness.  Abdominal:     General: Abdomen is flat. Bowel sounds are normal. There is no distension.     Palpations: Abdomen is soft. There is no mass.     Tenderness: There is no abdominal tenderness. There is no guarding or rebound.  Musculoskeletal:        General: No tenderness or edema. Normal range of motion.     Cervical back: Normal range of motion and neck supple. No tenderness.     Right lower leg: No edema.     Left lower leg: No edema.  Lymphadenopathy:     Cervical: No cervical adenopathy.  Skin:    General: Skin is warm and dry.     Capillary Refill: Capillary refill takes less than 2 seconds.     Findings: No rash.  Neurological:     General: No focal deficit present.     Mental Status: She is alert and oriented to person, place, and time. Mental status is at baseline.  Psychiatric:        Mood and Affect: Mood and affect and mood normal.        Behavior: Behavior normal.        Thought Content: Thought content normal.        Judgment: Judgment normal.       No results found for any visits on 10/01/20.  Assessment & Plan     1. Annual physical exam Normal physical exam today. Will check  labs as below and f/u pending lab results. If labs are stable and WNL she will not need to have these rechecked for one year at her  next annual physical exam. She is to call the office in the meantime if she has any acute issue, questions or concerns.  2. Uncontrolled type 2 diabetes mellitus with hyperglycemia (HCC) Previously uncontrolled. Only on Glyberide. Intolerant to Metformin. Had been on Ozempic but patient stopped using. Will check labs as below and f/u pending results. - CBC with Differential/Platelet - Comprehensive metabolic panel - HgB H1I  3. Essential (primary) hypertension Stable. Diagnosis pulled for medication refill. Continue current medical treatment plan. Will check labs as below and f/u pending results. - CBC with Differential/Platelet - Comprehensive metabolic panel - atenolol (TENORMIN) 50 MG tablet; Take 1 tablet (50 mg total) by mouth daily.  Dispense: 90 tablet; Refill: 3 - amLODipine-benazepril (LOTREL) 5-20 MG capsule; Take 1 capsule by mouth daily.  Dispense: 90 capsule; Refill: 3  4. Hypercholesterolemia without hypertriglyceridemia Patient declines statin. Will check labs as below and f/u pending results. - CBC with Differential/Platelet - Comprehensive metabolic panel - Lipid panel  5. Type 2 diabetes mellitus with hyperosmolarity without coma, without long-term current use of insulin (HCC) Stable. Diagnosis pulled for medication refill. Continue current medical treatment plan. Will check labs as below and f/u pending results. - glyBURIDE (DIABETA) 2.5 MG tablet; Take 1 tablet (2.5 mg total) by mouth 2 (two) times daily.  Dispense: 180 tablet; Refill: 1 - HgB A1c  6. Class 1 obesity due to excess calories with serious comorbidity and body mass index (BMI) of 33.0 to 33.9 in adult Counseled patient on healthy lifestyle modifications including dieting and exercise.  Will check labs as below and f/u pending results. - TSH  7. Encounter for breast cancer screening using non-mammogram modality Breast exam today was normal. There is no family history of breast cancer. She does perform  regular self breast exams. Mammogram was ordered as below. Information for [facility] Breast clinic was given to patient so she may schedule her mammogram at her convenience. - MM 3D SCREEN BREAST BILATERAL; Future   Return in about 1 year (around 10/01/2021) for CPE.      Reynolds Bowl, PA-C, have reviewed all documentation for this visit. The documentation on 10/01/20 for the exam, diagnosis, procedures, and orders are all accurate and complete.   Rubye Beach  [facility] [phone] (phone) [phone] (fax)  [place]

## 2020-10-01 ENCOUNTER — Ambulatory Visit: Payer: 59 | Admitting: Physician Assistant

## 2020-10-01 ENCOUNTER — Encounter: Payer: Self-pay | Admitting: Physician Assistant

## 2020-10-01 ENCOUNTER — Other Ambulatory Visit: Payer: Self-pay

## 2020-10-01 VITALS — BP 122/79 | HR 71 | Temp 98.6°F | Resp 16 | Wt 184.6 lb

## 2020-10-01 DIAGNOSIS — Z0000 Encounter for general adult medical examination without abnormal findings: Secondary | ICD-10-CM | POA: Diagnosis not present

## 2020-10-01 DIAGNOSIS — I10 Essential (primary) hypertension: Secondary | ICD-10-CM

## 2020-10-01 DIAGNOSIS — E1165 Type 2 diabetes mellitus with hyperglycemia: Secondary | ICD-10-CM | POA: Diagnosis not present

## 2020-10-01 DIAGNOSIS — E7800 Pure hypercholesterolemia, unspecified: Secondary | ICD-10-CM

## 2020-10-01 DIAGNOSIS — E6609 Other obesity due to excess calories: Secondary | ICD-10-CM

## 2020-10-01 DIAGNOSIS — E1100 Type 2 diabetes mellitus with hyperosmolarity without nonketotic hyperglycemic-hyperosmolar coma (NKHHC): Secondary | ICD-10-CM

## 2020-10-01 DIAGNOSIS — Z6833 Body mass index (BMI) 33.0-33.9, adult: Secondary | ICD-10-CM

## 2020-10-01 DIAGNOSIS — Z1239 Encounter for other screening for malignant neoplasm of breast: Secondary | ICD-10-CM

## 2020-10-01 MED ORDER — ATENOLOL 50 MG PO TABS: 50.0000 mg | ORAL_TABLET | Freq: Every day | ORAL | 3 refills | Status: DC

## 2020-10-01 MED ORDER — AMLODIPINE BESY-BENAZEPRIL HCL 5-20 MG PO CAPS: 1.0000 | ORAL_CAPSULE | Freq: Every day | ORAL | 3 refills | Status: DC

## 2020-10-01 MED ORDER — GLYBURIDE 2.5 MG PO TABS: 2.5000 mg | ORAL_TABLET | Freq: Two times a day (BID) | ORAL | 1 refills | Status: DC

## 2020-10-01 NOTE — Patient Instructions (Signed)
[facility] at [place],  [place]  [address] [address]: [phone]  Biotene lozenges or mouth wash for dry mouth   Preventive Care 19-63 Years Old, Female Preventive care refers to visits with your health care provider and lifestyle choices that can promote health and wellness. This includes:  A yearly physical exam. This may also be called an annual well check.  Regular dental visits and eye exams.  Immunizations.  Screening for certain conditions.  Healthy lifestyle choices, such as eating a healthy diet, getting regular exercise, not using drugs or products that contain nicotine and tobacco, and limiting alcohol use. What can I expect for my preventive care visit? Physical exam Your health care provider will check your:  Height and weight. This may be used to calculate body mass index (BMI), which tells if you are at a healthy weight.  Heart rate and blood pressure.  Skin for abnormal spots. Counseling Your health care provider may ask you questions about your:  Alcohol, tobacco, and drug use.  Emotional well-being.  Home and relationship well-being.  Sexual activity.  Eating habits.  Work and work Statistician.  Method of birth control.  Menstrual cycle.  Pregnancy history. What immunizations do I need?  Influenza (flu) vaccine  This is recommended every year. Tetanus, diphtheria, and pertussis (Tdap) vaccine  You may need a Td booster every 10 years. Varicella (chickenpox) vaccine  You may need this if you have not been vaccinated. Zoster (shingles) vaccine  You may need this after age 71. Measles, mumps, and rubella (MMR) vaccine  You may need at least one dose of MMR if you were born in 1957 or later. You may also need a second dose. Pneumococcal conjugate (PCV13) vaccine  You may need this if you have certain conditions and were not previously vaccinated. Pneumococcal polysaccharide (PPSV23)  vaccine  You may need one or two doses if you smoke cigarettes or if you have certain conditions. Meningococcal conjugate (MenACWY) vaccine  You may need this if you have certain conditions. Hepatitis A vaccine  You may need this if you have certain conditions or if you travel or work in places where you may be exposed to hepatitis A. Hepatitis B vaccine  You may need this if you have certain conditions or if you travel or work in places where you may be exposed to hepatitis B. Haemophilus influenzae type b (Hib) vaccine  You may need this if you have certain conditions. Human papillomavirus (HPV) vaccine  If recommended by your health care provider, you may need three doses over 6 months. You may receive vaccines as individual doses or as more than one vaccine together in one shot (combination vaccines). Talk with your health care provider about the risks and benefits of combination vaccines. What tests do I need? Blood tests  Lipid and cholesterol levels. These may be checked every 5 years, or more frequently if you are over 84 years old.  Hepatitis C test.  Hepatitis B test. Screening  Lung cancer screening. You may have this screening every year starting at age 38 if you have a 30-pack-year history of smoking and currently smoke or have quit within the past 15 years.  Colorectal cancer screening. All adults should have this screening starting at age 94 and continuing until age 62. Your health care provider may recommend screening at age 55 if you are at increased risk. You will have tests every 1-10 years, depending on your results and the  type of screening test.  Diabetes screening. This is done by checking your blood sugar (glucose) after you have not eaten for a while (fasting). You may have this done every 1-3 years.  Mammogram. This may be done every 1-2 years. Talk with your health care provider about when you should start having regular mammograms. This may depend on  whether you have a family history of breast cancer.  BRCA-related cancer screening. This may be done if you have a family history of breast, ovarian, tubal, or peritoneal cancers.  Pelvic exam and Pap test. This may be done every 3 years starting at age 30. Starting at age 79, this may be done every 5 years if you have a Pap test in combination with an HPV test. Other tests  Sexually transmitted disease (STD) testing.  Bone density scan. This is done to screen for osteoporosis. You may have this scan if you are at high risk for osteoporosis. Follow these instructions at home: Eating and drinking  Eat a diet that includes fresh fruits and vegetables, whole grains, lean protein, and low-fat dairy.  Take vitamin and mineral supplements as recommended by your health care provider.  Do not drink alcohol if: ? Your health care provider tells you not to drink. ? You are pregnant, may be pregnant, or are planning to become pregnant.  If you drink alcohol: ? Limit how much you have to 0-1 drink a day. ? Be aware of how much alcohol is in your drink. In the [place] one drink equals one 12 oz bottle of beer (355 mL), one 5 oz glass of wine (148 mL), or one 1 oz glass of hard liquor (44 mL). Lifestyle  Take daily care of your teeth and gums.  Stay active. Exercise for at least 30 minutes on 5 or more days each week.  Do not use any products that contain nicotine or tobacco, such as cigarettes, e-cigarettes, and chewing tobacco. If you need help quitting, ask your health care provider.  If you are sexually active, practice safe sex. Use a condom or other form of birth control (contraception) in order to prevent pregnancy and STIs (sexually transmitted infections).  If told by your health care provider, take low-dose aspirin daily starting at age 39. What's next?  Visit your health care provider once a year for a well check visit.  Ask your health care provider how often you should have your  eyes and teeth checked.  Stay up to date on all vaccines. This information is not intended to replace advice given to you by your health care provider. Make sure you discuss any questions you have with your health care provider. Document Revised: 06/15/2018 Document Reviewed: 06/15/2018 Elsevier Patient Education  2020 [employer].

## 2020-10-02 LAB — CBC WITH DIFFERENTIAL/PLATELET
Basophils Absolute: 0 10*3/uL (ref 0.0–0.2)
Basos: 0 %
EOS (ABSOLUTE): 0 10*3/uL (ref 0.0–0.4)
Eos: 0 %
Hematocrit: 41.1 % (ref 34.0–46.6)
Hemoglobin: 13.4 g/dL (ref 11.1–15.9)
Immature Grans (Abs): 0 10*3/uL (ref 0.0–0.1)
Immature Granulocytes: 0 %
Lymphocytes Absolute: 2 10*3/uL (ref 0.7–3.1)
Lymphs: 25 %
MCH: 28 pg (ref 26.6–33.0)
MCHC: 32.6 g/dL (ref 31.5–35.7)
MCV: 86 fL (ref 79–97)
Monocytes Absolute: 0.8 10*3/uL (ref 0.1–0.9)
Monocytes: 10 %
Neutrophils Absolute: 5.1 10*3/uL (ref 1.4–7.0)
Neutrophils: 65 %
Platelets: 319 10*3/uL (ref 150–450)
RBC: 4.79 x10E6/uL (ref 3.77–5.28)
RDW: 13 % (ref 11.7–15.4)
WBC: 8 10*3/uL (ref 3.4–10.8)

## 2020-10-02 LAB — LIPID PANEL
Chol/HDL Ratio: 3.4 ratio (ref 0.0–4.4)
Cholesterol, Total: 164 mg/dL (ref 100–199)
HDL: 48 mg/dL (ref >39)
LDL Chol Calc (NIH): 99 mg/dL (ref 0–99)
Triglycerides: 89 mg/dL (ref 0–149)
VLDL Cholesterol Cal: 17 mg/dL (ref 5–40)

## 2020-10-02 LAB — COMPREHENSIVE METABOLIC PANEL
ALT: 22 IU/L (ref 0–32)
AST: 27 IU/L (ref 0–40)
Albumin/Globulin Ratio: 1.6 (ref 1.2–2.2)
Albumin: 4.3 g/dL (ref 3.8–4.8)
Alkaline Phosphatase: 91 IU/L (ref 44–121)
BUN/Creatinine Ratio: 19 (ref 12–28)
BUN: 21 mg/dL (ref 8–27)
Bilirubin Total: 0.2 mg/dL (ref 0.0–1.2)
CO2: 19 mmol/L — ABNORMAL LOW (ref 20–29)
Calcium: 9.2 mg/dL (ref 8.7–10.3)
Chloride: 96 mmol/L (ref 96–106)
Creatinine, Ser: 1.13 mg/dL — ABNORMAL HIGH (ref 0.57–1.00)
GFR calc Af Amer: 60 mL/min/{1.73_m2} (ref >59)
GFR calc non Af Amer: 52 mL/min/{1.73_m2} — ABNORMAL LOW (ref >59)
Globulin, Total: 2.7 g/dL (ref 1.5–4.5)
Glucose: 259 mg/dL — ABNORMAL HIGH (ref 65–99)
Potassium: 4.9 mmol/L (ref 3.5–5.2)
Sodium: 133 mmol/L — ABNORMAL LOW (ref 134–144)
Total Protein: 7 g/dL (ref 6.0–8.5)

## 2020-10-02 LAB — HEMOGLOBIN A1C
Est. average glucose Bld gHb Est-mCnc: 341 mg/dL
Hgb A1c MFr Bld: 13.5 % — ABNORMAL HIGH (ref 4.8–5.6)

## 2020-10-02 LAB — TSH: TSH: 2.41 u[IU]/mL (ref 0.450–4.500)

## 2020-10-03 ENCOUNTER — Telehealth: Payer: Self-pay

## 2020-10-03 DIAGNOSIS — E1165 Type 2 diabetes mellitus with hyperglycemia: Secondary | ICD-10-CM

## 2020-10-03 MED ORDER — SYNJARDY 5-500 MG PO TABS: 1.0000 | ORAL_TABLET | Freq: Two times a day (BID) | ORAL | 5 refills | Status: DC

## 2020-10-03 NOTE — Telephone Encounter (Signed)
-----   Message from Mar Daring, [place] sent at 10/02/2020  1:15 PM EST ----- Shari Lee,  Labs are looking overall ok. Biggest concerns are that your kidney function did decline slightly. This could be secondary to mild dehydration, but could be starting some kidney damage from uncontrolled diabetes. Also of noted is that your A1c did increase from 11.2 to 13.5. I do think we need to give better coverage than just the glyburide. I know yesterday we discussed considering a combination medication that has the long-acting metformin. Would you like to try that? If so, I will send in. We should recheck labs in 3 months.   Grace Bushy, [facility]

## 2020-10-03 NOTE — Telephone Encounter (Signed)
I have sent in Synjardy to take twice daily. Stop glyburide.

## 2020-10-03 NOTE — Telephone Encounter (Signed)
Patient was advised and agrees with starting the Metformin. Patient would like to know if she would be taking both the Glyburide and Metformin together? She also would like to know if it will help to get her kidney function back in the normal range? Please advise.

## 2020-10-03 NOTE — Telephone Encounter (Signed)
LMTCB-Ok for PEC nurse to give patient providers message. 

## 2020-10-05 ENCOUNTER — Emergency Department
Admission: EM | Admit: 2020-10-05 | Discharge: 2020-10-05 | Disposition: A | Discharge status: Home / Self Care | Payer: 59 | Source: Home / Self Care | Attending: Emergency Medicine | Admitting: Emergency Medicine

## 2020-10-05 ENCOUNTER — Emergency Department: Payer: 59

## 2020-10-05 ENCOUNTER — Other Ambulatory Visit: Payer: Self-pay

## 2020-10-05 ENCOUNTER — Encounter: Payer: Self-pay | Admitting: Emergency Medicine

## 2020-10-05 DIAGNOSIS — Z794 Long term (current) use of insulin: Secondary | ICD-10-CM | POA: Insufficient documentation

## 2020-10-05 DIAGNOSIS — R0602 Shortness of breath: Secondary | ICD-10-CM | POA: Insufficient documentation

## 2020-10-05 DIAGNOSIS — Z79899 Other long term (current) drug therapy: Secondary | ICD-10-CM | POA: Insufficient documentation

## 2020-10-05 DIAGNOSIS — R0902 Hypoxemia: Secondary | ICD-10-CM | POA: Diagnosis not present

## 2020-10-05 DIAGNOSIS — R059 Cough, unspecified: Secondary | ICD-10-CM | POA: Insufficient documentation

## 2020-10-05 DIAGNOSIS — R519 Headache, unspecified: Secondary | ICD-10-CM | POA: Insufficient documentation

## 2020-10-05 DIAGNOSIS — U071 COVID-19: Secondary | ICD-10-CM | POA: Insufficient documentation

## 2020-10-05 DIAGNOSIS — I10 Essential (primary) hypertension: Secondary | ICD-10-CM | POA: Insufficient documentation

## 2020-10-05 DIAGNOSIS — E119 Type 2 diabetes mellitus without complications: Secondary | ICD-10-CM | POA: Insufficient documentation

## 2020-10-05 LAB — BASIC METABOLIC PANEL
Anion gap: 16 — ABNORMAL HIGH (ref 5–15)
Anion gap: 12 (ref 5–15)
BUN: 10 mg/dL (ref 8–23)
BUN: 10 mg/dL (ref 8–23)
CO2: 17 mmol/L — ABNORMAL LOW (ref 22–32)
CO2: 18 mmol/L — ABNORMAL LOW (ref 22–32)
Calcium: 8.3 mg/dL — ABNORMAL LOW (ref 8.9–10.3)
Calcium: 7.1 mg/dL — ABNORMAL LOW (ref 8.9–10.3)
Chloride: 94 mmol/L — ABNORMAL LOW (ref 98–111)
Chloride: 102 mmol/L (ref 98–111)
Creatinine, Ser: 0.77 mg/dL (ref 0.44–1.00)
Creatinine, Ser: 0.6 mg/dL (ref 0.44–1.00)
GFR, Estimated: >60 mL/min (ref >60)
GFR, Estimated: >60 mL/min (ref >60)
Glucose, Bld: 228 mg/dL — ABNORMAL HIGH (ref 70–99)
Glucose, Bld: 162 mg/dL — ABNORMAL HIGH (ref 70–99)
Potassium: 3.8 mmol/L (ref 3.5–5.1)
Potassium: 3.3 mmol/L — ABNORMAL LOW (ref 3.5–5.1)
Sodium: 127 mmol/L — ABNORMAL LOW (ref 135–145)
Sodium: 132 mmol/L — ABNORMAL LOW (ref 135–145)

## 2020-10-05 LAB — URINALYSIS, COMPLETE (UACMP) WITH MICROSCOPIC
Bacteria, UA: NONE SEEN
Bilirubin Urine: NEGATIVE
Glucose, UA: >=500 mg/dL — AB
Hgb urine dipstick: NEGATIVE
Ketones, ur: 80 mg/dL — AB
Leukocytes,Ua: NEGATIVE
Nitrite: NEGATIVE
Protein, ur: 30 mg/dL — AB
Specific Gravity, Urine: 1.015 (ref 1.005–1.030)
pH: 5 (ref 5.0–8.0)

## 2020-10-05 LAB — RESP PANEL BY RT-PCR (FLU A&B, COVID) ARPGX2
Influenza A by PCR: NEGATIVE
Influenza B by PCR: NEGATIVE
SARS Coronavirus 2 by RT PCR: POSITIVE — AB

## 2020-10-05 LAB — CBC
HCT: 39.4 % (ref 36.0–46.0)
Hemoglobin: 13.3 g/dL (ref 12.0–15.0)
MCH: 27.9 pg (ref 26.0–34.0)
MCHC: 33.8 g/dL (ref 30.0–36.0)
MCV: 82.8 fL (ref 80.0–100.0)
Platelets: 263 10*3/uL (ref 150–400)
RBC: 4.76 MIL/uL (ref 3.87–5.11)
RDW: 13.2 % (ref 11.5–15.5)
WBC: 7.6 10*3/uL (ref 4.0–10.5)
nRBC: 0 % (ref 0.0–0.2)

## 2020-10-05 MED ORDER — BENZONATATE 100 MG PO CAPS: 100.0000 mg | ORAL_CAPSULE | Freq: Four times a day (QID) | ORAL | 0 refills | Status: DC | PRN

## 2020-10-05 MED ORDER — SODIUM CHLORIDE 0.9 % IV BOLUS
1000.0000 mL | Freq: Once | INTRAVENOUS | Status: AC
  Administered 2020-10-05: 1000 mL via INTRAVENOUS

## 2020-10-05 MED ORDER — ALBUTEROL SULFATE HFA 108 (90 BASE) MCG/ACT IN AERS: 2.0000 | INHALATION_SPRAY | Freq: Four times a day (QID) | RESPIRATORY_TRACT | 2 refills | Status: DC | PRN

## 2020-10-05 MED ORDER — ONDANSETRON HCL 4 MG PO TABS: 4.0000 mg | ORAL_TABLET | Freq: Three times a day (TID) | ORAL | 0 refills | Status: DC | PRN

## 2020-10-05 MED ORDER — CALCIUM CARBONATE ANTACID 500 MG PO CHEW
400.0000 mg | CHEWABLE_TABLET | Freq: Once | ORAL | Status: AC
  Administered 2020-10-05: 400 mg via ORAL
  Filled 2020-10-05: qty 2

## 2020-10-05 NOTE — ED Triage Notes (Signed)
Pt in via EMS from home with c/o nausea, HA and a cough for a week. Pt took home covid test that was negative. FSBS 256, 191/88, pt has not taken meds this am but did take them before leaving with EMS. HS 101, 95% RA, 98.5 temp forehead

## 2020-10-05 NOTE — ED Provider Notes (Signed)
[facility] Emergency Department Provider Note  ____________________________________________   I have reviewed the triage vital signs and the nursing notes.   HISTORY  Chief Complaint Weakness  History limited by: Not Limited   HPI Shari Lee is a 64 y.o. female who presents to the emergency department today with primary concern for weakness. The patient states that she started feeling bad roughly 1 week ago with muscle aches. It did start to get better until a few days ago when she started feeling bad again. She started having cough, shortness of breath, decreased appetite and weakness. Today her weakness was so profound she was having a hard time getting up. The patient states she did not receive a covid vaccine.    Records reviewed. Per medical record review patient has a history of DM, headache, HTN.  Past Medical History:  Diagnosis Date  . Diabetes mellitus without complication ([place])   . Headache   . Hypertension     Patient Active Problem List   Diagnosis Date Noted  . Type 2 diabetes mellitus ([place]) 07/28/2015  . 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 Diabetes mellitus type 2, uncontrolled ([place]) 04/29/2015  . History of digestive disease 04/29/2015  . 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 Hypercholesterolemia without hypertriglyceridemia 12/11/2009  . Apnea, sleep 07/30/2008  . Colon, diverticulosis 06/07/2008  . Essential (primary) hypertension 04/25/2006    Past Surgical History:  Procedure Laterality Date  . ABDOMINAL HYSTERECTOMY  2006  . BREAST BIOPSY Left 2005   benign  . BREAST EXCISIONAL BIOPSY Left 2005?    -benign  . COLONOSCOPY WITH PROPOFOL N/A 08/13/2016   Procedure: COLONOSCOPY WITH PROPOFOL;  Surgeon: Lucilla Lame, MD;  Location: [place];  Service: Endoscopy;  Laterality: N/A;  . POLYPECTOMY  08/13/2016   Procedure: POLYPECTOMY;  Surgeon: Lucilla Lame, MD;  Location: [place];  Service:  Endoscopy;;    Prior to Admission medications   Medication Sig Start Date End Date Taking? Authorizing Provider  amLODipine-benazepril (LOTREL) 5-20 MG capsule Take 1 capsule by mouth daily. 10/01/20   Mar Daring, PA-C  atenolol (TENORMIN) 50 MG tablet Take 1 tablet (50 mg total) by mouth daily. 10/01/20   Mar Daring, PA-C  Blood Glucose Monitoring Suppl (CONTOUR NEXT USB MONITOR) w/Device KIT 1 kit by Does not apply route daily. To check blood sugar once daily. 10/03/19   Mar Daring, PA-C  CareTouch Safety Lancets 26G MISC 1 Device by Does not apply route daily. To check blood sugar once daily. 10/03/19   Mar Daring, PA-C  Empagliflozin-metFORMIN HCl (SYNJARDY) 5-500 MG TABS Take 1 tablet by mouth 2 (two) times daily. 10/03/20   Mar Daring, PA-C  glucose blood (CONTOUR NEXT TEST) test strip To check blood sugar once daily. 10/03/19   Mar Daring, PA-C  Insulin Pen Needle (NOVOFINE PLUS) 32G X 4 MM MISC To use with ozempic injections 01/31/19   Mar Daring, PA-C  amLODipine-benazepril (LOTREL) 5-20 MG capsule Take 1 capsule by mouth once daily 01/18/19   Mar Daring, PA-C  atenolol (TENORMIN) 50 MG tablet Take 1 tablet by mouth once daily 01/15/19   Fenton Malling M, PA-C  glyBURIDE (DIABETA) 2.5 MG tablet Take 1 tablet by mouth twice daily 01/15/19   Mar Daring, PA-C    Allergies Amoxicillin-pot clavulanate, Levsin  [hyoscyamine], Penicillins, Metformin, and Metformin and related  Family History  Problem Relation Age of Onset  . Heart attack Mother   .  Diabetes Mother   . Heart disease Mother   . Heart failure Father   . Breast cancer Sister   . Sleep apnea Brother   . Diverticulitis Brother   . Diverticulitis Sister     Social History Social History   Tobacco Use  . Smoking status: Never Smoker  . Smokeless tobacco: Never Used  Substance Use Topics  . Alcohol use: No    Alcohol/week: 0.0  standard drinks  . Drug use: No    Review of Systems Constitutional: Positive for generalized weakness. Eyes: No visual changes. ENT: No sore throat. Cardiovascular: Positive for chest discomfort.  Respiratory: Positive for shortness of breath and cough. Gastrointestinal: Positive for decreased oral intake. Genitourinary: Negative for dysuria. Musculoskeletal: Positive for body aches. Skin: Negative for rash. Neurological: Positive for headache ____________________________________________   PHYSICAL EXAM:  VITAL SIGNS: ED Triage Vitals  Enc Vitals Group     BP 10/05/20 1326 (!) 143/74     Pulse Rate 10/05/20 1326 87     Resp 10/05/20 1326 18     Temp 10/05/20 1326 100.2 F (37.9 C)     Temp Source 10/05/20 1326 Oral     SpO2 10/05/20 1326 90 %     Weight 10/05/20 1331 184 lb 9.6 oz (83.7 kg)     Height 10/05/20 1331 5' 2"  (1.575 m)     Head Circumference --      Peak Flow --      Pain Score 10/05/20 1328 6    Constitutional: Alert and oriented.  Eyes: Conjunctivae are normal.  ENT      Head: Normocephalic and atraumatic.      Nose: No congestion/rhinnorhea.      Mouth/Throat: Mucous membranes are moist.      Neck: No stridor. Hematological/Lymphatic/Immunilogical: No cervical lymphadenopathy. Cardiovascular: Normal rate, regular rhythm.  No murmurs, rubs, or gallops.  Respiratory: Normal respiratory effort without tachypnea nor retractions. Breath sounds are clear and equal bilaterally. No wheezes/rales/rhonchi. Gastrointestinal: Soft and non tender. No rebound. No guarding.  Genitourinary: Deferred Musculoskeletal: Normal range of motion in all extremities. No lower extremity edema. Neurologic:  Normal speech and language. No gross focal neurologic deficits are appreciated.  Skin:  Skin is warm, dry and intact. No rash noted. Psychiatric: Mood and affect are normal. Speech and behavior are normal. Patient exhibits appropriate insight and  judgment.  ____________________________________________    LABS (pertinent positives/negatives)  UA hazy, ketones 80, wbc 0-5 CBC wbc 7.6, hgb 13.3, plt 263 COVID positive BMP na 127, k 3.8, glu 228, cr 0.77  ____________________________________________   EKG  I, Nance Pear, attending physician, personally viewed and interpreted this EKG  EKG Time: 1343 Rate: 81 Rhythm: sinus rhythm with 1st degree av block Axis: left axis deviation Intervals: qtc 441 QRS: narrow, q waves v1, v2, v3 ST changes: no st elevation Impression: abnormal ekg ____________________________________________    RADIOLOGY  CXR Bibasilar infiltrates  ____________________________________________   PROCEDURES  Procedures  ____________________________________________   INITIAL IMPRESSION / ASSESSMENT AND PLAN / ED COURSE  Pertinent labs & imaging results that were available during my care of the patient were reviewed by me and considered in my medical decision making (see chart for details).   Patient presented to the emergency department today because of concerns for symptoms consistent with Covid.  Patient Covid was positive here.  Patient was not hypoxic on ambulation.  Initial blood work did show hyponatremia however this did improve with fluids.  She did have some hypocalcemia  however I do wonder if this is somewhat dilutional.  Did give patient some calcium repletion here discussed with patient importance of following up with primary care for recheck.  Also discussed with patient importance of symptomatic care and return precautions.  ____________________________________________   FINAL CLINICAL IMPRESSION(S) / ED DIAGNOSES  Final diagnoses:  COVID-19     Note: This dictation was prepared with Dragon dictation. Any transcriptional errors that result from this process are unintentional     Nance Pear, MD 10/05/20 2123

## 2020-10-05 NOTE — ED Notes (Signed)
Pt ambulated around in room at this time. O2 Sats went up to 96-100% on RA. Provider aware.

## 2020-10-05 NOTE — Discharge Instructions (Addendum)
Please follow up closely with your primary care physician to recheck your lab values, as discussed your calcium did come back a little low on repeat, although this could have been do to the fluids you received. You should also buy a portable pulse oximeter (device to measure your oxygen levels), if it falls below 90 you would have to return to the emergency department. Please seek medical attention for any high fevers, chest pain, shortness of breath, change in behavior, persistent vomiting, bloody stool or any other new or concerning symptoms.

## 2020-10-05 NOTE — ED Notes (Signed)
Pt to ED for c/o weakness, generalized aches and cough since Saturday, a week ago. Pt states that she is unvaccinated. Denies fevers at home. States just "don't feel well".

## 2020-10-05 NOTE — ED Notes (Signed)
Lab called to inform covid positive.

## 2020-10-05 NOTE — ED Triage Notes (Addendum)
Pt arrived via [facility] from home with reports of being weak, cough, HA x 1 week.  Pt states EMS was called by daughter who states the patient was took weak to get up to answer the door.  Pt seen by PCP on Wednesday for annual physical but did not mention not feeling well.

## 2020-10-06 ENCOUNTER — Inpatient Hospital Stay
Admission: EM | Admit: 2020-10-06 | Discharge: 2020-10-14 | DRG: 177 | Disposition: A | Discharge status: Home Health | Payer: 59 | Attending: Internal Medicine | Admitting: Internal Medicine

## 2020-10-06 ENCOUNTER — Emergency Department: Payer: 59

## 2020-10-06 ENCOUNTER — Other Ambulatory Visit: Payer: Self-pay

## 2020-10-06 ENCOUNTER — Encounter: Payer: Self-pay | Admitting: Radiology

## 2020-10-06 ENCOUNTER — Telehealth: Payer: Self-pay | Admitting: Physician Assistant

## 2020-10-06 DIAGNOSIS — T380X5A Adverse effect of glucocorticoids and synthetic analogues, initial encounter: Secondary | ICD-10-CM | POA: Diagnosis present

## 2020-10-06 DIAGNOSIS — Z90710 Acquired absence of both cervix and uterus: Secondary | ICD-10-CM

## 2020-10-06 DIAGNOSIS — Z8249 Family history of ischemic heart disease and other diseases of the circulatory system: Secondary | ICD-10-CM | POA: Diagnosis not present

## 2020-10-06 DIAGNOSIS — J9601 Acute respiratory failure with hypoxia: Secondary | ICD-10-CM | POA: Diagnosis present

## 2020-10-06 DIAGNOSIS — E1165 Type 2 diabetes mellitus with hyperglycemia: Secondary | ICD-10-CM | POA: Diagnosis present

## 2020-10-06 DIAGNOSIS — E876 Hypokalemia: Secondary | ICD-10-CM | POA: Diagnosis not present

## 2020-10-06 DIAGNOSIS — IMO0002 Reserved for concepts with insufficient information to code with codable children: Secondary | ICD-10-CM | POA: Diagnosis present

## 2020-10-06 DIAGNOSIS — U071 COVID-19: Secondary | ICD-10-CM | POA: Diagnosis not present

## 2020-10-06 DIAGNOSIS — Z7984 Long term (current) use of oral hypoglycemic drugs: Secondary | ICD-10-CM

## 2020-10-06 DIAGNOSIS — Z880 Allergy status to penicillin: Secondary | ICD-10-CM

## 2020-10-06 DIAGNOSIS — Z794 Long term (current) use of insulin: Secondary | ICD-10-CM

## 2020-10-06 DIAGNOSIS — Z833 Family history of diabetes mellitus: Secondary | ICD-10-CM

## 2020-10-06 DIAGNOSIS — I10 Essential (primary) hypertension: Secondary | ICD-10-CM | POA: Diagnosis present

## 2020-10-06 DIAGNOSIS — J1282 Pneumonia due to coronavirus disease 2019: Secondary | ICD-10-CM | POA: Diagnosis present

## 2020-10-06 DIAGNOSIS — Z6837 Body mass index (BMI) 37.0-37.9, adult: Secondary | ICD-10-CM | POA: Diagnosis not present

## 2020-10-06 DIAGNOSIS — J069 Acute upper respiratory infection, unspecified: Secondary | ICD-10-CM | POA: Diagnosis not present

## 2020-10-06 DIAGNOSIS — Z881 Allergy status to other antibiotic agents status: Secondary | ICD-10-CM | POA: Diagnosis not present

## 2020-10-06 DIAGNOSIS — R0902 Hypoxemia: Secondary | ICD-10-CM

## 2020-10-06 DIAGNOSIS — Z888 Allergy status to other drugs, medicaments and biological substances status: Secondary | ICD-10-CM | POA: Diagnosis not present

## 2020-10-06 DIAGNOSIS — Z79899 Other long term (current) drug therapy: Secondary | ICD-10-CM | POA: Diagnosis not present

## 2020-10-06 DIAGNOSIS — R5381 Other malaise: Secondary | ICD-10-CM

## 2020-10-06 DIAGNOSIS — I152 Hypertension secondary to endocrine disorders: Secondary | ICD-10-CM | POA: Diagnosis present

## 2020-10-06 LAB — CBC WITH DIFFERENTIAL/PLATELET
Abs Immature Granulocytes: 0.05 10*3/uL (ref 0.00–0.07)
Basophils Absolute: 0 10*3/uL (ref 0.0–0.1)
Basophils Relative: 0 %
Eosinophils Absolute: 0 10*3/uL (ref 0.0–0.5)
Eosinophils Relative: 0 %
HCT: 36.4 % (ref 36.0–46.0)
Hemoglobin: 12.4 g/dL (ref 12.0–15.0)
Immature Granulocytes: 1 %
Lymphocytes Relative: 12 %
Lymphs Abs: 1.1 10*3/uL (ref 0.7–4.0)
MCH: 28.2 pg (ref 26.0–34.0)
MCHC: 34.1 g/dL (ref 30.0–36.0)
MCV: 82.7 fL (ref 80.0–100.0)
Monocytes Absolute: 0.5 10*3/uL (ref 0.1–1.0)
Monocytes Relative: 5 %
Neutro Abs: 7.4 10*3/uL (ref 1.7–7.7)
Neutrophils Relative %: 82 %
Platelets: 278 10*3/uL (ref 150–400)
RBC: 4.4 MIL/uL (ref 3.87–5.11)
RDW: 13.6 % (ref 11.5–15.5)
WBC: 9 10*3/uL (ref 4.0–10.5)
nRBC: 0 % (ref 0.0–0.2)

## 2020-10-06 LAB — BASIC METABOLIC PANEL
Anion gap: 11 (ref 5–15)
BUN: 10 mg/dL (ref 8–23)
CO2: 22 mmol/L (ref 22–32)
Calcium: 8.3 mg/dL — ABNORMAL LOW (ref 8.9–10.3)
Chloride: 97 mmol/L — ABNORMAL LOW (ref 98–111)
Creatinine, Ser: 0.8 mg/dL (ref 0.44–1.00)
GFR, Estimated: >60 mL/min (ref >60)
Glucose, Bld: 239 mg/dL — ABNORMAL HIGH (ref 70–99)
Potassium: 4.1 mmol/L (ref 3.5–5.1)
Sodium: 130 mmol/L — ABNORMAL LOW (ref 135–145)

## 2020-10-06 MED ORDER — SODIUM CHLORIDE 0.9 % IV BOLUS
500.0000 mL | Freq: Once | INTRAVENOUS | Status: AC
  Administered 2020-10-06: 500 mL via INTRAVENOUS

## 2020-10-06 MED ORDER — DEXAMETHASONE SODIUM PHOSPHATE 10 MG/ML IJ SOLN
8.0000 mg | Freq: Once | INTRAMUSCULAR | Status: AC
  Administered 2020-10-06: 8 mg via INTRAVENOUS
  Filled 2020-10-06: qty 1

## 2020-10-06 MED ORDER — SODIUM CHLORIDE 0.9 % IV SOLN
200.0000 mg | Freq: Once | INTRAVENOUS | Status: AC
  Administered 2020-10-07: 200 mg via INTRAVENOUS
  Filled 2020-10-06: qty 40

## 2020-10-06 MED ORDER — SODIUM CHLORIDE 0.9 % IV SOLN
100.0000 mg | Freq: Every day | INTRAVENOUS | Status: AC
  Administered 2020-10-07 – 2020-10-10 (×4): 100 mg via INTRAVENOUS
  Filled 2020-10-06 (×5): qty 20

## 2020-10-06 NOTE — Telephone Encounter (Signed)
Called to Discuss with patient about Covid symptoms and the use of the monoclonal antibody infusion for those with mild to moderate Covid symptoms and at a high risk of hospitalization.     Pt appears to qualify for this infusion due to co-morbid conditions and/or a member of an at-risk group in accordance with the FDA Emergency Use Authorization.    Unable to reach pt. Left voice mail to call back.

## 2020-10-06 NOTE — Telephone Encounter (Signed)
Patient advised as directed below. 

## 2020-10-06 NOTE — Consult Note (Signed)
Remdesivir - Pharmacy Brief Note   O:  ALT: 22 on 10/01/20 CXR: "Mild to moderate severity bilateral infiltrates" SpO2: Hypoxic requiring supplemental oxygen   A/P:  10/05/20 SARS-CoV-2 PCR (+)  Remdesivir 200 mg IVPB once followed by 100 mg IVPB daily x 4 days.   Benita Gutter 10/06/2020 10:17 PM

## 2020-10-06 NOTE — ED Notes (Signed)
admit Provider at bedside. 

## 2020-10-06 NOTE — ED Provider Notes (Signed)
[facility] Emergency Department Provider Note ____________________________________________   Event Date/Time   First MD Initiated Contact with Patient 10/06/20 2129     (approximate)  I have reviewed the triage vital signs and the nursing notes.  HISTORY  Chief Complaint Covid Positive (Seen yesterday for same)  HPI Shari Lee is a 64 y.o. female who was diagnosed with COVID-19 yesterday  Patient reports since going home she has had her home oxygen monitor and it was lowing as low as 87% oxygen levels.  She also is reportedly started to feel slight increasing shortness of breath today.  She continues to feel fatigue generally weak the symptoms have been present for about 1 week's time now  No chest pain.  No nausea vomiting.  Does report decreased appetite.  She has not noticed any leg swelling denies any history of blood clots.  No chest pain.  Patient reports she just feels like her symptoms are slowly worsening and her oxygen levels are starting to run low at home.    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    -benign   COLONOSCOPY WITH PROPOFOL N/A 08/13/2016   Procedure: COLONOSCOPY  WITH PROPOFOL;  Surgeon: Lucilla Lame, MD;  Location: [place];  Service: Endoscopy;  Laterality: N/A;   POLYPECTOMY  08/13/2016   Procedure: POLYPECTOMY;  Surgeon: Lucilla Lame, MD;  Location: [place];  Service: Endoscopy;;    Prior to Admission medications   Medication Sig Start Date End Date Taking? Authorizing Provider  albuterol (VENTOLIN HFA) 108 (90 Base) MCG/ACT inhaler Inhale 2 puffs into the lungs every 6 (six) hours as needed for wheezing or shortness of breath. 10/05/20  Yes Nance Pear, MD  amLODipine-benazepril (LOTREL) 5-20 MG capsule Take 1 capsule by mouth daily. 10/01/20  Yes Mar Daring, PA-C  atenolol (TENORMIN) 50 MG tablet Take 1 tablet (50 mg total) by mouth daily. 10/01/20  Yes Fenton Malling M, PA-C  benzonatate (TESSALON PERLES) 100 MG capsule Take 1 capsule (100 mg total) by mouth every 6 (six) hours as needed for cough. 10/05/20 10/05/21 Yes Nance Pear, MD  Blood Glucose Monitoring Suppl (CONTOUR NEXT USB MONITOR) w/Device KIT 1 kit by Does not apply route daily. To check blood sugar once daily. 10/03/19  Yes Mar Daring, PA-C  CareTouch Safety Lancets 26G MISC 1 Device by Does not apply route daily. To check blood sugar once daily. 10/03/19  Yes Mar Daring, PA-C  Empagliflozin-metFORMIN HCl (SYNJARDY) 5-500 MG TABS Take 1 tablet by mouth 2 (two) times daily. 10/03/20  Yes Mar Daring, PA-C  glucose blood (CONTOUR NEXT TEST) test strip To check blood sugar once daily. 10/03/19  Yes Fenton Malling M, PA-C  Insulin Pen Needle (NOVOFINE PLUS)  32G X 4 MM MISC To use with ozempic injections 01/31/19  Yes Burnette, Jennifer M, PA-C  ondansetron (ZOFRAN) 4 MG tablet Take 1 tablet (4 mg total) by mouth every 8 (eight) hours as needed. 10/05/20  Yes Nance Pear, MD  amLODipine-benazepril (LOTREL) 5-20 MG capsule Take 1 capsule by mouth once daily 01/18/19   Mar Daring, PA-C  atenolol  (TENORMIN) 50 MG tablet Take 1 tablet by mouth once daily 01/15/19   Fenton Malling M, PA-C  glyBURIDE (DIABETA) 2.5 MG tablet Take 1 tablet by mouth twice daily 01/15/19   Mar Daring, PA-C    Allergies Amoxicillin-pot clavulanate, Levsin  [hyoscyamine], Penicillins, Metformin, and Metformin and related  Family History  Problem Relation Age of Onset   Heart attack Mother    Diabetes Mother    Heart disease Mother    Heart failure Father    Breast cancer Sister    Sleep apnea Brother    Diverticulitis Brother    Diverticulitis Sister     Social History Social History   Tobacco Use   Smoking status: Never Smoker   Smokeless tobacco: Never Used  Substance Use Topics   Alcohol use: No    Alcohol/week: 0.0 standard drinks   Drug use: No    Review of Systems Constitutional: No fever/chills but is feeling very fatigued generally weak Eyes: No visual changes. ENT: No sore throat. Cardiovascular: Denies chest pain. Respiratory: See HPI Gastrointestinal: No abdominal pain.  Decreased appetite Genitourinary: Negative for dysuria. Musculoskeletal: Negative for back pain. Skin: Negative for rash. Neurological: Negative for headaches, areas of focal weakness or numbness.    ____________________________________________   PHYSICAL EXAM:  VITAL SIGNS: ED Triage Vitals  Enc Vitals Group     BP 10/06/20 2122 (!) 185/79     Pulse Rate 10/06/20 2122 80     Resp 10/06/20 2122 12     Temp 10/06/20 2122 99.5 F (37.5 C)     Temp Source 10/06/20 2122 Oral     SpO2 10/06/20 2122 (!) 87 %     Weight 10/06/20 2123 200 lb (90.7 kg)     Height 10/06/20 2123 _0  (1.575 m)     Head Circumference --      Peak Flow --      Pain Score --      Pain Loc --      Pain Edu? --      Excl. in [place]? --     Constitutional: Alert and oriented. Well appearing and in no acute distress. Eyes: Conjunctivae are normal. Head: Atraumatic. Nose: No  congestion/rhinnorhea. Mouth/Throat: Mucous membranes are moist. Neck: No stridor.  Cardiovascular: Normal rate, regular rhythm. Grossly normal heart sounds.  Good peripheral circulation. Respiratory: Normal respiratory effort.  No retractions. Lungs CTAB.  Noted oxygen saturation about 89% when placed on room air, improved to mid 90s on 2 L Gastrointestinal: Soft and nontender. No distention. Musculoskeletal: No lower extremity tenderness nor edema. Neurologic:  Normal speech and language. No gross focal neurologic deficits are appreciated.  Skin:  Skin is warm, dry and intact. No rash noted. Psychiatric: Mood and affect are normal. Speech and behavior are normal.  ____________________________________________   LABS (all labs ordered are listed, but only abnormal results are displayed)  Labs Reviewed  BASIC METABOLIC PANEL - Abnormal; Notable for the following components:      Result Value   Sodium 130 (*)    Chloride 97 (*)    Glucose, Bld 239 (*)  Calcium 8.3 (*)    All other components within normal limits  CBC WITH DIFFERENTIAL/PLATELET   ____________________________________________  EKG  Reviewed inter by me at 2123 Heart rate 80 QRS 90 QTc 430 Normal sinus rhythm, no evidence of acute ischemia denoted possible old anteroseptal infarct ____________________________________________  RADIOLOGY  DG Chest Portable 1 View  Result Date: 10/06/2020 CLINICAL DATA:  COVID positive with shortness of breath. EXAM: PORTABLE CHEST 1 VIEW COMPARISON:  October 05, 2020 FINDINGS: Mild to moderate severity infiltrates are seen within the mid left lung and bilateral lung bases. This is mildly increased in severity when compared to the prior study. There is no evidence of a pleural effusion or pneumothorax. The heart size and mediastinal contours are within normal limits. There is mild calcification of the aortic arch. Degenerative changes seen throughout the thoracic spine.  IMPRESSION: Mild to moderate severity bilateral infiltrates, mildly increased in severity when compared to the prior study. Electronically Signed   By: Virgina Norfolk M.D.   On: 10/06/2020 22:01    Bilateral infiltrative disease reviewed by me ____________________________________________   PROCEDURES  Procedure(s) performed: None  Procedures  Critical Care performed: Yes, see critical care note(s)  CRITICAL CARE Performed by: Delman Kitten   Total critical care time: 30 minutes  Critical care time was exclusive of separately billable procedures and treating other patients.  Critical care was necessary to treat or prevent imminent or life-threatening deterioration.  Critical care was time spent personally by me on the following activities: development of treatment plan with patient and/or surrogate as well as nursing, discussions with consultants, evaluation of patient's response to treatment, examination of patient, obtaining history from patient or surrogate, ordering and performing treatments and interventions, ordering and review of laboratory studies, ordering and review of radiographic studies, pulse oximetry and re-evaluation of patient's condition.  COVID-19, bilateral pulmonary infiltrates, hypoxia requiring oxygen initiation of remdesivir and steroids.  High risk of worsening respiratory burden ____________________________________________   INITIAL IMPRESSION / ASSESSMENT AND PLAN / ED COURSE  Pertinent labs & imaging results that were available during my care of the patient were reviewed by me and considered in my medical decision making (see chart for details).   Patient presents for worsening shortness of breath and noted hypoxia at home and also here on room air.  Improved on oxygen.  No pleuritic chest pain no signs or symptoms suggest obvious acute pulmonary embolism.  Known COVID-19.  Given her imaging studies and clinical history it appears consistent with  worsening COVID-19.  Discussed with the patient she is agreeable with treatment recommendations steroids remdesivir, and admission to the hospital.  She is relatively nontoxic at this point but does have developing oxygen requirement and I suspect that inpatient treatment would be beneficial at this time.      ____________________________________________   FINAL CLINICAL IMPRESSION(S) / ED DIAGNOSES  Final diagnoses:  [id number]  Hypoxia        Note:  This document was prepared using Dragon voice recognition software and may include unintentional dictation errors       Delman Kitten, MD 10/06/20 2323

## 2020-10-07 ENCOUNTER — Other Ambulatory Visit: Payer: Self-pay

## 2020-10-07 LAB — GLUCOSE, CAPILLARY
Glucose-Capillary: 194 mg/dL — ABNORMAL HIGH (ref 70–99)
Glucose-Capillary: 288 mg/dL — ABNORMAL HIGH (ref 70–99)
Glucose-Capillary: 285 mg/dL — ABNORMAL HIGH (ref 70–99)

## 2020-10-07 LAB — PROCALCITONIN: Procalcitonin: <0.1 ng/mL

## 2020-10-07 LAB — CBG MONITORING, ED
Glucose-Capillary: 209 mg/dL — ABNORMAL HIGH (ref 70–99)
Glucose-Capillary: 214 mg/dL — ABNORMAL HIGH (ref 70–99)
Glucose-Capillary: 236 mg/dL — ABNORMAL HIGH (ref 70–99)

## 2020-10-07 LAB — HIV ANTIBODY (ROUTINE TESTING W REFLEX): HIV Screen 4th Generation wRfx: NONREACTIVE

## 2020-10-07 MED ORDER — ZINC SULFATE 220 (50 ZN) MG PO CAPS
220.0000 mg | ORAL_CAPSULE | Freq: Every day | ORAL | Status: DC
  Administered 2020-10-08 – 2020-10-14 (×6): 220 mg via ORAL
  Filled 2020-10-07 (×6): qty 1

## 2020-10-07 MED ORDER — SODIUM CHLORIDE 0.9 % IV SOLN: 250.0000 mL | INTRAVENOUS | Status: DC | PRN

## 2020-10-07 MED ORDER — AMLODIPINE BESYLATE 5 MG PO TABS
5.0000 mg | ORAL_TABLET | Freq: Every day | ORAL | Status: DC
  Administered 2020-10-07 – 2020-10-14 (×8): 5 mg via ORAL
  Filled 2020-10-07 (×8): qty 1

## 2020-10-07 MED ORDER — LINAGLIPTIN 5 MG PO TABS
5.0000 mg | ORAL_TABLET | Freq: Every day | ORAL | Status: DC
  Administered 2020-10-07 – 2020-10-14 (×7): 5 mg via ORAL
  Filled 2020-10-07 (×11): qty 1

## 2020-10-07 MED ORDER — METHYLPREDNISOLONE SODIUM SUCC 125 MG IJ SOLR
0.5000 mg/kg | Freq: Two times a day (BID) | INTRAMUSCULAR | Status: AC
Start: 2020-10-07 — End: 2020-10-09
  Administered 2020-10-07 – 2020-10-09 (×6): 45.625 mg via INTRAVENOUS
  Filled 2020-10-07 (×6): qty 2

## 2020-10-07 MED ORDER — BENAZEPRIL HCL 20 MG PO TABS
20.0000 mg | ORAL_TABLET | Freq: Every day | ORAL | Status: DC
  Administered 2020-10-07 – 2020-10-14 (×8): 20 mg via ORAL
  Filled 2020-10-07 (×9): qty 1

## 2020-10-07 MED ORDER — INSULIN ASPART 100 UNIT/ML ~~LOC~~ SOLN
0.0000 [IU] | Freq: Every day | SUBCUTANEOUS | Status: DC
  Administered 2020-10-07: 3 [IU] via SUBCUTANEOUS
  Administered 2020-10-08 – 2020-10-10 (×3): 2 [IU] via SUBCUTANEOUS
  Administered 2020-10-12 – 2020-10-13 (×2): 3 [IU] via SUBCUTANEOUS
  Filled 2020-10-07 (×6): qty 1

## 2020-10-07 MED ORDER — ACETAMINOPHEN 325 MG PO TABS: 650.0000 mg | ORAL_TABLET | Freq: Four times a day (QID) | ORAL | Status: DC | PRN

## 2020-10-07 MED ORDER — ALBUTEROL SULFATE HFA 108 (90 BASE) MCG/ACT IN AERS
2.0000 | INHALATION_SPRAY | Freq: Four times a day (QID) | RESPIRATORY_TRACT | Status: DC | PRN
  Filled 2020-10-07: qty 6.7

## 2020-10-07 MED ORDER — PREDNISONE 50 MG PO TABS
50.0000 mg | ORAL_TABLET | Freq: Every day | ORAL | Status: DC
  Administered 2020-10-10 – 2020-10-14 (×5): 50 mg via ORAL
  Filled 2020-10-07 (×5): qty 1

## 2020-10-07 MED ORDER — SODIUM CHLORIDE 0.9% FLUSH
3.0000 mL | Freq: Two times a day (BID) | INTRAVENOUS | Status: DC
  Administered 2020-10-07 – 2020-10-14 (×15): 3 mL via INTRAVENOUS

## 2020-10-07 MED ORDER — INSULIN ASPART 100 UNIT/ML ~~LOC~~ SOLN
0.0000 [IU] | SUBCUTANEOUS | Status: DC
  Administered 2020-10-07 (×2): 3 [IU] via SUBCUTANEOUS
  Filled 2020-10-07 (×2): qty 1

## 2020-10-07 MED ORDER — AMLODIPINE BESY-BENAZEPRIL HCL 5-20 MG PO CAPS: 1.0000 | ORAL_CAPSULE | Freq: Every day | ORAL | Status: DC

## 2020-10-07 MED ORDER — ATENOLOL 50 MG PO TABS
50.0000 mg | ORAL_TABLET | Freq: Every day | ORAL | Status: DC
  Administered 2020-10-07 – 2020-10-14 (×8): 50 mg via ORAL
  Filled 2020-10-07 (×9): qty 1

## 2020-10-07 MED ORDER — ASCORBIC ACID 500 MG PO TABS
500.0000 mg | ORAL_TABLET | Freq: Every day | ORAL | Status: DC
  Administered 2020-10-07 – 2020-10-14 (×8): 500 mg via ORAL
  Filled 2020-10-07 (×7): qty 1

## 2020-10-07 MED ORDER — LOPERAMIDE HCL 2 MG PO CAPS
2.0000 mg | ORAL_CAPSULE | ORAL | Status: DC | PRN
  Administered 2020-10-07: 2 mg via ORAL
  Filled 2020-10-07: qty 1

## 2020-10-07 MED ORDER — HYDROCODONE-ACETAMINOPHEN 5-325 MG PO TABS: 1.0000 | ORAL_TABLET | Freq: Four times a day (QID) | ORAL | Status: DC | PRN

## 2020-10-07 MED ORDER — GUAIFENESIN-DM 100-10 MG/5ML PO SYRP
10.0000 mL | ORAL_SOLUTION | ORAL | Status: DC | PRN
  Administered 2020-10-07 – 2020-10-10 (×3): 10 mL via ORAL
  Filled 2020-10-07 (×3): qty 10

## 2020-10-07 MED ORDER — ONDANSETRON HCL 4 MG/2ML IJ SOLN: 4.0000 mg | Freq: Four times a day (QID) | INTRAMUSCULAR | Status: DC | PRN

## 2020-10-07 MED ORDER — HYDROCOD POLST-CPM POLST ER 10-8 MG/5ML PO SUER
5.0000 mL | Freq: Two times a day (BID) | ORAL | Status: DC | PRN
  Administered 2020-10-07 – 2020-10-08 (×4): 5 mL via ORAL
  Filled 2020-10-07 (×4): qty 5

## 2020-10-07 MED ORDER — ONDANSETRON HCL 4 MG PO TABS: 4.0000 mg | ORAL_TABLET | Freq: Four times a day (QID) | ORAL | Status: DC | PRN

## 2020-10-07 MED ORDER — SODIUM CHLORIDE 0.9% FLUSH: 3.0000 mL | INTRAVENOUS | Status: DC | PRN

## 2020-10-07 MED ORDER — ENOXAPARIN SODIUM 40 MG/0.4ML ~~LOC~~ SOLN
40.0000 mg | SUBCUTANEOUS | Status: DC
  Administered 2020-10-07 – 2020-10-09 (×3): 40 mg via SUBCUTANEOUS
  Filled 2020-10-07 (×3): qty 0.4

## 2020-10-07 MED ORDER — INSULIN ASPART 100 UNIT/ML ~~LOC~~ SOLN
0.0000 [IU] | Freq: Three times a day (TID) | SUBCUTANEOUS | Status: DC
  Administered 2020-10-07: 4 [IU] via SUBCUTANEOUS
  Administered 2020-10-07: 7 [IU] via SUBCUTANEOUS
  Administered 2020-10-07 – 2020-10-08 (×2): 11 [IU] via SUBCUTANEOUS
  Administered 2020-10-08: 10:00:00 20 [IU] via SUBCUTANEOUS
  Administered 2020-10-08: 13:00:00 15 [IU] via SUBCUTANEOUS
  Administered 2020-10-09: 11 [IU] via SUBCUTANEOUS
  Administered 2020-10-09: 15 [IU] via SUBCUTANEOUS
  Administered 2020-10-09: 7 [IU] via SUBCUTANEOUS
  Administered 2020-10-10 (×2): 11 [IU] via SUBCUTANEOUS
  Administered 2020-10-10: 7 [IU] via SUBCUTANEOUS
  Administered 2020-10-11: 4 [IU] via SUBCUTANEOUS
  Administered 2020-10-12: 13:00:00 3 [IU] via SUBCUTANEOUS
  Administered 2020-10-12: 7 [IU] via SUBCUTANEOUS
  Administered 2020-10-13: 11 [IU] via SUBCUTANEOUS
  Administered 2020-10-13: 4 [IU] via SUBCUTANEOUS
  Filled 2020-10-07 (×17): qty 1

## 2020-10-07 NOTE — ED Notes (Signed)
Pt daughter updated per request

## 2020-10-07 NOTE — ED Notes (Signed)
Pt ambulated to restroom on oxygen, on return to bed, patient saturations were 84% on 6L, after a few minutes, oxygen level recovered, pt saturations 93% on 6liters.

## 2020-10-07 NOTE — Progress Notes (Signed)
PROGRESS NOTE    Shari Lee  [id number] DOB: [birth year] DOA: 10/06/2020 PCP: Mar Daring, PA-C    Brief Narrative:  Shari Lee is a 64 year old female with past medical history significant for type 2 diabetes mellitus, essential hypertension, obesity who presented to the emergency department with 1 week history of nausea, generalized malaise, cough and shortness of breath. Patient was seen in the emergency department yesterday where she was found to have COVID-19, but was stable for discharge at that time.  Over the past day, her symptoms have continued to progress.  She denies any leg swelling or tenderness, denies hemoptysis, and reports some occasional loose stool without abdominal pain.  She was monitoring oxygen saturations at home and noted that they were in the 80s today.  Upon arrival to the ED, patient is found to be afebrile, saturating mid to upper 80s on room air, tachypneic into the 30s, and with blood pressure 180/80.  EKG features a sinus rhythm and chest x-ray demonstrates mild to moderate severity bilateral infiltrates that have progressed from yesterday.  CBC features a glucose of 239.  CBC unremarkable.  Patient was started on Decadron and remdesivir in the ED, also placed on supplemental oxygen and given 500 cc of saline.   Assessment & Plan:   Principal Problem:   Acute respiratory disease due to COVID-19 virus Active Problems:   Diabetes mellitus type 2, uncontrolled (HCC)   Essential (primary) hypertension   Acute hypoxic respiratory failure secondary to acute Covid-19 viral pneumonia during the ongoing 2020/2021 Covid 19 Pandemic - POA Patient representing to the ED with progressive shortness of breath, was found to be hypoxic at home during home O2 monitoring with SPO2 in the 80s.  Chest x-ray with mild/moderate bilateral infiltrates. --COVID test: + PCR 10/05/2020 --CRP:  pending --ddimer: Pending --Remdesivir, plan 5-day course (Day  #2/5) --Continue Solumedrol 45.625mg  IV q12h x 3 days, followed by prednisone 50mg  daily --prone for 2-3hrs every 12hrs if able --Continue supplemental oxygen, titrate to maintain SPO2 greater than 92%, on 6L [place] w/ SpO2 93% --Continue supportive care with albuterol MDI prn, vitamin C, zinc, Tylenol, antitussives (benzonatate/ Mucinex/Tussionex) --Follow CBC, CMP, D-dimer, ferritin, and CRP daily --Continue airborne/contact isolation precautions for 3 weeks from the day of diagnosis  Type 2 diabetes mellitus, poorly controlled Home regimen includes empagliflozin-metformin 5-500mg  BID.  Hemoglobin A1c 13.5 on 10/01/2020. --Hold oral hypoglycemics while inpatient --start Lantus 20u [place] daily --Tradjenta 5 mg p.o. daily --Resistant insulin/scale for further coverage --CBG's qAC/HS --Diabetic educator consult  Essential hypertension BP 156/73 this morning. --Continue home amlodipine 5 mg p.o. daily, benazepril 20 mg p.o. daily, atenolol 50 mg p.o. daily --Continue monitor blood pressure closely, may need further adjustments based on BP trend  Obesity Body mass index is 36.58 kg/m. Discussed with patient needs for aggressive lifestyle changes/weight loss as this complicates all facets of care.   DVT prophylaxis: Lovenox Code Status: Full code Family Communication: Updated patient extensively at bedside; updated patient's daughter, Alyse Low via telephone this morning  Disposition Plan:  Status is: Inpatient  Remains inpatient appropriate because:Ongoing diagnostic testing needed not appropriate for outpatient work up, Unsafe d/c plan, IV treatments appropriate due to intensity of illness or inability to take PO and Inpatient level of care appropriate due to severity of illness   Dispo: The patient is from: Home              Anticipated d/c is to: Home  Anticipated d/c date is: 3 days              Patient currently is not medically stable to d/c. I this is Dr. Chyrl Civatte 1 the  medical doctors here at [facility] is 1 [place] giving updates on your mom I saw her earlier this morning she was doing just fine now she has the Covid viral pneumonia infection affecting her lungs she is on 6 L of oxygen which he no compared to a lot of other patients we had very minimal we have people up to 50  Consultants:   None  Procedures:   None  Antimicrobials:   None   Subjective: Patient seen and examined at bedside, resting comfortably in ED holding area.  Continues with shortness of breath at rest.  Dyspnea worsens with any type of minimal exertion.  On 6 L nasal cannula with SPO2 93%.  Continues with mild nonproductive cough.  Requesting cough medicine.  No other questions or concerns at this time.  Denies headache, no fever/chills/night sweats, no nausea/vomiting/diarrhea, no chest pain, no palpitations, no abdominal pain.  No acute concerns overnight or this morning per nursing staff.  Objective: Vitals:   10/07/20 0630 10/07/20 0703 10/07/20 0800 10/07/20 0925  BP:  (!) 156/73 (!) 137/91 (!) 147/67  Pulse: 64 64 69 67  Resp: (!) 28 (!) 24 (!) 26 18  Temp:  97.8 F (36.6 C)    TempSrc:  Oral    SpO2: 93% 93% 97% 94%  Weight:      Height:       No intake or output data in the 24 hours ending 10/07/20 1001 Filed Weights   10/06/20 2123  Weight: 90.7 kg    Examination:  General exam: Appears calm and comfortable  Respiratory system: Clear to auscultation. Respiratory effort normal.  On 6 L nasal cannula with SPO2 93% Cardiovascular system: S1 & S2 heard, RRR. No JVD, murmurs, rubs, gallops or clicks. No pedal edema. Gastrointestinal system: Abdomen is nondistended, soft and nontender. No organomegaly or masses felt. Normal bowel sounds heard. Central nervous system: Alert and oriented. No focal neurological deficits. Extremities: Symmetric 5 x 5 power. Skin: No rashes, lesions or ulcers Psychiatry: Judgement and insight appear normal. Mood & affect  appropriate.     Data Reviewed: I have personally reviewed following labs and imaging studies  CBC: Recent Labs  Lab 10/01/20 1435 10/05/20 1342 10/06/20 2203  WBC 8.0 7.6 9.0  NEUTROABS 5.1  --  7.4  HGB 13.4 13.3 12.4  HCT 41.1 39.4 36.4  MCV 86 82.8 82.7  PLT 319 263 829   Basic Metabolic Panel: Recent Labs  Lab 10/01/20 1435 10/05/20 1342 10/05/20 1825 10/06/20 2203  NA 133* 127* 132* 130*  K 4.9 3.8 3.3* 4.1  CL 96 94* 102 97*  CO2 19* 17* 18* 22  GLUCOSE 259* 228* 162* 239*  BUN 21 10 10 10   CREATININE 1.13* 0.77 0.60 0.80  CALCIUM 9.2 8.3* 7.1* 8.3*   GFR: Estimated Creatinine Clearance: 75.3 mL/min (by C-G formula based on SCr of 0.8 mg/dL). Liver Function Tests: Recent Labs  Lab 10/01/20 1435  AST 27  ALT 22  ALKPHOS 91  BILITOT 0.2  PROT 7.0  ALBUMIN 4.3   No results for input(s): LIPASE, AMYLASE in the last 168 hours. No results for input(s): AMMONIA in the last 168 hours. Coagulation Profile: No results for input(s): INR, PROTIME in the last 168 hours. Cardiac Enzymes: No results for input(s):  CKTOTAL, CKMB, CKMBINDEX, TROPONINI in the last 168 hours. BNP (last 3 results) No results for input(s): PROBNP in the last 8760 hours. HbA1C: No results for input(s): HGBA1C in the last 72 hours. CBG: Recent Labs  Lab 10/07/20 0026 10/07/20 0326 10/07/20 0824  GLUCAP 209* 214* 236*   Lipid Profile: No results for input(s): CHOL, HDL, LDLCALC, TRIG, CHOLHDL, LDLDIRECT in the last 72 hours. Thyroid Function Tests: No results for input(s): TSH, T4TOTAL, FREET4, T3FREE, THYROIDAB in the last 72 hours. Anemia Panel: No results for input(s): VITAMINB12, FOLATE, FERRITIN, TIBC, IRON, RETICCTPCT in the last 72 hours. Sepsis Labs: Recent Labs  Lab 10/07/20 0422  PROCALCITON <0.10    Recent Results (from the past 240 hour(s))  Resp Panel by RT-PCR (Flu A&B, Covid) Nasopharyngeal Swab     Status: Abnormal   Collection Time: 10/05/20  1:41 PM    Specimen: Nasopharyngeal Swab; Nasopharyngeal(NP) swabs in vial transport medium  Result Value Ref Range Status   SARS Coronavirus 2 by RT PCR POSITIVE (A) NEGATIVE Final    Comment: RESULT CALLED TO, READ BACK BY AND VERIFIED WITH: TAMMY JUDE 10/05/20 AT 1434 BY ACR (NOTE) SARS-CoV-2 target nucleic acids are DETECTED.  The SARS-CoV-2 RNA is generally detectable in upper respiratory specimens during the acute phase of infection. Positive results are indicative of the presence of the identified virus, but do not rule out bacterial infection or co-infection with other pathogens not detected by the test. Clinical correlation with patient history and other diagnostic information is necessary to determine patient infection status. The expected result is Negative.  Fact Sheet for Patients: EntrepreneurPulse.com.au  Fact Sheet for Healthcare Providers: IncredibleEmployment.be  This test is not yet approved or cleared by the [place] FDA and  has been authorized for detection and/or diagnosis of SARS-CoV-2 by FDA under an Emergency Use Authorization (EUA).  This EUA will remain in effect (meaning this test can b e used) for the duration of  the COVID-19 declaration under Section 564(b)(1) of the Act, 21 U.S.C. section 360bbb-3(b)(1), unless the authorization is terminated or revoked sooner.     Influenza A by PCR NEGATIVE NEGATIVE Final   Influenza B by PCR NEGATIVE NEGATIVE Final    Comment: (NOTE) The Xpert Xpress SARS-CoV-2/FLU/RSV plus assay is intended as an aid in the diagnosis of influenza from Nasopharyngeal swab specimens and should not be used as a sole basis for treatment. Nasal washings and aspirates are unacceptable for Xpert Xpress SARS-CoV-2/FLU/RSV testing.  Fact Sheet for Patients: EntrepreneurPulse.com.au  Fact Sheet for Healthcare Providers: IncredibleEmployment.be  This test is not yet  approved or cleared by the [place] FDA and has been authorized for detection and/or diagnosis of SARS-CoV-2 by FDA under an Emergency Use Authorization (EUA). This EUA will remain in effect (meaning this test can be used) for the duration of the COVID-19 declaration under Section 564(b)(1) of the Act, 21 U.S.C. section 360bbb-3(b)(1), unless the authorization is terminated or revoked.  Performed at [facility], [address] [place], [place] [address]          Radiology Studies: DG Chest 2 View  Result Date: 10/05/2020 CLINICAL DATA:  Cough and weakness. EXAM: CHEST - 2 VIEW COMPARISON:  December 11, 2009 FINDINGS: Bibasilar infiltrates, right greater than left. No pneumothorax. The cardiomediastinal silhouette is stable and unremarkable. No other acute abnormalities. IMPRESSION: Bibasilar infiltrates, right greater than left, worrisome for pneumonia. Recommend short-term follow-up imaging after treatment to ensure resolution. Electronically Signed   By: Shanon Brow  Mee Hives M.D   On: 10/05/2020 14:41   DG Chest Portable 1 View  Result Date: 10/06/2020 CLINICAL DATA:  COVID positive with shortness of breath. EXAM: PORTABLE CHEST 1 VIEW COMPARISON:  October 05, 2020 FINDINGS: Mild to moderate severity infiltrates are seen within the mid left lung and bilateral lung bases. This is mildly increased in severity when compared to the prior study. There is no evidence of a pleural effusion or pneumothorax. The heart size and mediastinal contours are within normal limits. There is mild calcification of the aortic arch. Degenerative changes seen throughout the thoracic spine. IMPRESSION: Mild to moderate severity bilateral infiltrates, mildly increased in severity when compared to the prior study. Electronically Signed   By: Virgina Norfolk M.D.   On: 10/06/2020 22:01        Scheduled Meds: . amLODipine  5 mg Oral Daily   And  . benazepril  20 mg Oral Daily  .  atenolol  50 mg Oral Daily  . enoxaparin (LOVENOX) injection  40 mg Subcutaneous Q24H  . insulin aspart  0-20 Units Subcutaneous TID WC  . insulin aspart  0-5 Units Subcutaneous QHS  . linagliptin  5 mg Oral Daily  . methylPREDNISolone (SOLU-MEDROL) injection  0.5 mg/kg Intravenous Q12H   Followed by  . [START ON 10/10/2020] predniSONE  50 mg Oral Daily  . sodium chloride flush  3 mL Intravenous Q12H   Continuous Infusions: . sodium chloride    . remdesivir 100 mg in NS 100 mL       LOS: 1 day    Time spent: 39 minutes spent on chart review, discussion with nursing staff, consultants, updating family and interview/physical exam; more than 50% of that time was spent in counseling and/or coordination of care.    [place] J [place] DO [facility] Available via Epic secure chat 7am-7pm After these hours, please refer to coverage provider listed on [email or web address] 10/07/2020, 10:01 AM

## 2020-10-07 NOTE — ED Notes (Signed)
ED TO INPATIENT HANDOFF REPORT  ED Nurse Name and Phone #: [phone]  S Name/Age/Gender Shari Lee 64 y.o. female Room/Bed: ED37A/ED37A  Code Status   Code Status: Full Code  Home/SNF/Other Home Patient oriented to: self, place, time and situation Is this baseline? Yes   Triage Complete: Triage complete  Chief Complaint Acute respiratory disease due to COVID-19 virus [U07.1, J06.9]  Triage Note No notes on file   Allergies Allergies  Allergen Reactions  . Amoxicillin-Pot Clavulanate   . Levsin  [Hyoscyamine]     Other reaction(s): Dizzyness  . Penicillins   . Metformin Diarrhea  . Metformin And Related Diarrhea    Level of Care/Admitting Diagnosis ED Disposition    ED Disposition Condition [facility] Area: [place] [[id number]]  Level of Care: Med-Surg [16]  Covid Evaluation: Confirmed COVID Positive  Diagnosis: Acute respiratory disease due to COVID-19 virus [id number]  Admitting Physician: Vianne Bulls [id number]  Attending Physician: Vianne Bulls [id number]  Estimated length of stay: past midnight tomorrow  Certification:: I certify this patient will need inpatient services for at least 2 midnights       B Medical/Surgery History Past Medical History:  Diagnosis Date  . Diabetes mellitus without complication ([place])   . Headache   . Hypertension    Past Surgical History:  Procedure Laterality Date  . ABDOMINAL HYSTERECTOMY  2006  . BREAST BIOPSY Left 2005   benign  . BREAST EXCISIONAL BIOPSY Left 2005?    -benign  . COLONOSCOPY WITH PROPOFOL N/A 08/13/2016   Procedure: COLONOSCOPY WITH PROPOFOL;  Surgeon: Lucilla Lame, MD;  Location: [place];  Service: Endoscopy;  Laterality: N/A;  . POLYPECTOMY  08/13/2016   Procedure: POLYPECTOMY;  Surgeon: Lucilla Lame, MD;  Location: [place];  Service: Endoscopy;;     A IV Location/Drains/Wounds Patient Lines/Drains/Airways Status    Active  Line/Drains/Airways    Name Placement date Placement time Site Days   Peripheral IV 10/06/20 Right Antecubital 10/06/20  2219  Antecubital  1          Intake/Output Last 24 hours No intake or output data in the 24 hours ending 10/07/20 0704  Labs/Imaging Results for orders placed or performed during the hospital encounter of 10/06/20 (from the past 48 hour(s))  CBC with Differential     Status: None   Collection Time: 10/06/20 10:03 PM  Result Value Ref Range   WBC 9.0 4.0 - 10.5 K/uL   RBC 4.40 3.87 - 5.11 MIL/uL   Hemoglobin 12.4 12.0 - 15.0 g/dL   HCT 36.4 36.0 - 46.0 %   MCV 82.7 80.0 - 100.0 fL   MCH 28.2 26.0 - 34.0 pg   MCHC 34.1 30.0 - 36.0 g/dL   RDW 13.6 11.5 - 15.5 %   Platelets 278 150 - 400 K/uL   nRBC 0.0 0.0 - 0.2 %   Neutrophils Relative % 82 %   Neutro Abs 7.4 1.7 - 7.7 K/uL   Lymphocytes Relative 12 %   Lymphs Abs 1.1 0.7 - 4.0 K/uL   Monocytes Relative 5 %   Monocytes Absolute 0.5 0.1 - 1.0 K/uL   Eosinophils Relative 0 %   Eosinophils Absolute 0.0 0.0 - 0.5 K/uL   Basophils Relative 0 %   Basophils Absolute 0.0 0.0 - 0.1 K/uL   Immature Granulocytes 1 %   Abs Immature Granulocytes 0.05 0.00 - 0.07 K/uL    Comment: Performed at [facility],  [place], [place] [address]  Basic metabolic panel     Status: Abnormal   Collection Time: 10/06/20 10:03 PM  Result Value Ref Range   Sodium 130 (L) 135 - 145 mmol/L   Potassium 4.1 3.5 - 5.1 mmol/L   Chloride 97 (L) 98 - 111 mmol/L   CO2 22 22 - 32 mmol/L   Glucose, Bld 239 (H) 70 - 99 mg/dL    Comment: Glucose reference range applies only to samples taken after fasting for at least 8 hours.   BUN 10 8 - 23 mg/dL   Creatinine, Ser 0.80 0.44 - 1.00 mg/dL   Calcium 8.3 (L) 8.9 - 10.3 mg/dL   GFR, Estimated >60 >60 mL/min    Comment: (NOTE) Calculated using the CKD-EPI Creatinine Equation (2021)    Anion gap 11 5 - 15    Comment: Performed at [facility], [address] [place], [place] [address]  CBG monitoring, ED     Status: Abnormal   Collection Time: 10/07/20 12:26 AM  Result Value Ref Range   Glucose-Capillary 209 (H) 70 - 99 mg/dL    Comment: Glucose reference range applies only to samples taken after fasting for at least 8 hours.  CBG monitoring, ED     Status: Abnormal   Collection Time: 10/07/20  3:26 AM  Result Value Ref Range   Glucose-Capillary 214 (H) 70 - 99 mg/dL    Comment: Glucose reference range applies only to samples taken after fasting for at least 8 hours.  Procalcitonin     Status: None   Collection Time: 10/07/20  4:22 AM  Result Value Ref Range   Procalcitonin <0.10 ng/mL    Comment:        Interpretation: PCT (Procalcitonin) <= 0.5 ng/mL: Systemic infection (sepsis) is not likely. Local bacterial infection is possible. (NOTE)       Sepsis PCT Algorithm           Lower Respiratory Tract                                      Infection PCT Algorithm    ----------------------------     ----------------------------         PCT < 0.25 ng/mL                PCT < 0.10 ng/mL          Strongly encourage             Strongly discourage   discontinuation of antibiotics    initiation of antibiotics    ----------------------------     -----------------------------       PCT 0.25 - 0.50 ng/mL            PCT 0.10 - 0.25 ng/mL               OR       >80% decrease in PCT            Discourage initiation of                                            antibiotics      Encourage discontinuation           of antibiotics    ----------------------------     -----------------------------  PCT >= 0.50 ng/mL              PCT 0.26 - 0.50 ng/mL               AND        <80% decrease in PCT             Encourage initiation of                                             antibiotics       Encourage continuation           of antibiotics    ----------------------------     -----------------------------        PCT >= 0.50 ng/mL                   PCT > 0.50 ng/mL               AND         increase in PCT                  Strongly encourage                                      initiation of antibiotics    Strongly encourage escalation           of antibiotics                                     -----------------------------                                           PCT <= 0.25 ng/mL                                                 OR                                        > 80% decrease in PCT                                      Discontinue / Do not initiate                                             antibiotics  Performed at [facility], [address], [place], [place] [address]    DG Chest 2 View  Result Date: 10/05/2020 CLINICAL DATA:  Cough and weakness. EXAM: CHEST - 2 VIEW COMPARISON:  December 11, 2009 FINDINGS: Bibasilar infiltrates, right greater than left. No pneumothorax. The cardiomediastinal silhouette is stable and unremarkable. No other acute abnormalities. IMPRESSION: Bibasilar infiltrates, right  greater than left, worrisome for pneumonia. Recommend short-term follow-up imaging after treatment to ensure resolution. Electronically Signed   By: Dorise Bullion III M.D   On: 10/05/2020 14:41   DG Chest Portable 1 View  Result Date: 10/06/2020 CLINICAL DATA:  COVID positive with shortness of breath. EXAM: PORTABLE CHEST 1 VIEW COMPARISON:  October 05, 2020 FINDINGS: Mild to moderate severity infiltrates are seen within the mid left lung and bilateral lung bases. This is mildly increased in severity when compared to the prior study. There is no evidence of a pleural effusion or pneumothorax. The heart size and mediastinal contours are within normal limits. There is mild calcification of the aortic arch. Degenerative changes seen throughout the thoracic spine. IMPRESSION: Mild to moderate severity bilateral infiltrates, mildly increased in severity when compared to the prior study. Electronically Signed    By: Virgina Norfolk M.D.   On: 10/06/2020 22:01    Pending Labs Unresulted Labs (From admission, onward)          Start     Ordered   10/13/20 0500  Creatinine, serum  (enoxaparin (LOVENOX)    CrCl >/= 30 ml/min)  Weekly,   STAT     Comments: while on enoxaparin therapy    10/07/20 0005   10/08/20 0500  CBC with Differential/Platelet  Daily,   STAT      10/07/20 0005   10/08/20 0500  Comprehensive metabolic panel  Daily,   STAT      10/07/20 0005   10/08/20 0500  C-reactive protein  Daily,   STAT      10/07/20 0005   10/08/20 0500  Fibrin derivatives D-Dimer ([facility] only)  Daily,   STAT      10/07/20 0005   10/08/20 0500  Ferritin  Daily,   STAT      10/07/20 0005   10/08/20 0500  Magnesium  Tomorrow morning,   STAT        10/07/20 0005   10/08/20 0500  Phosphorus  Tomorrow morning,   STAT        10/07/20 0005   10/07/20 0429  HIV Antibody (routine testing w rflx)  Once,   STAT        10/07/20 0429          Vitals/Pain Today's Vitals   10/07/20 0545 10/07/20 0615 10/07/20 0630 10/07/20 0703  BP:    (!) 156/73  Pulse: (!) 57 (!) 55 64 64  Resp: (!) 27 (!) 24 (!) 28 (!) 24  Temp:    97.8 F (36.6 C)  TempSrc:    Oral  SpO2: 90% 91% 93% 93%  Weight:      Height:      PainSc:    0-No pain    Isolation Precautions Airborne and Contact precautions  Medications Medications  remdesivir 200 mg in sodium chloride 0.9% 250 mL IVPB (0 mg Intravenous Stopped 10/07/20 0129)    Followed by  remdesivir 100 mg in sodium chloride 0.9 % 100 mL IVPB (has no administration in time range)  amLODipine-benazepril (LOTREL) 5-20 MG per capsule 1 capsule (has no administration in time range)  atenolol (TENORMIN) tablet 50 mg (has no administration in time range)  albuterol (VENTOLIN HFA) 108 (90 Base) MCG/ACT inhaler 2 puff (has no administration in time range)  insulin aspart (novoLOG) injection 0-9 Units (3 Units Subcutaneous Given 10/07/20 0421)  enoxaparin (LOVENOX) injection 40  mg (has no administration in time range)  sodium chloride flush (NS) 0.9 % injection  3 mL (3 mLs Intravenous Given 10/07/20 0033)  sodium chloride flush (NS) 0.9 % injection 3 mL (has no administration in time range)  0.9 %  sodium chloride infusion (has no administration in time range)  methylPREDNISolone sodium succinate (SOLU-MEDROL) 125 mg/2 mL injection 45.625 mg (has no administration in time range)    Followed by  predniSONE (DELTASONE) tablet 50 mg (has no administration in time range)  guaiFENesin-dextromethorphan (ROBITUSSIN DM) 100-10 MG/5ML syrup 10 mL (has no administration in time range)  chlorpheniramine-HYDROcodone (TUSSIONEX) 10-8 MG/5ML suspension 5 mL (5 mLs Oral Given 10/07/20 0139)  acetaminophen (TYLENOL) tablet 650 mg (has no administration in time range)  HYDROcodone-acetaminophen (NORCO/VICODIN) 5-325 MG per tablet 1 tablet (has no administration in time range)  ondansetron (ZOFRAN) tablet 4 mg (has no administration in time range)    Or  ondansetron (ZOFRAN) injection 4 mg (has no administration in time range)  linagliptin (TRADJENTA) tablet 5 mg (has no administration in time range)  loperamide (IMODIUM) capsule 2 mg (2 mg Oral Given 10/07/20 0139)  dexamethasone (DECADRON) injection 8 mg (8 mg Intravenous Given 10/06/20 2221)  sodium chloride 0.9 % bolus 500 mL (0 mLs Intravenous Stopped 10/06/20 2315)    Mobility walks with person assist Low fall risk   Focused Assessments Renal Assessment Handoff:    , Pulmonary Assessment Handoff:  Lung sounds: Bilateral Breath Sounds: Diminished O2 Device: Nasal Cannula O2 Flow Rate (L/min): 6 L/min      R Recommendations: See Admitting Provider Note  Report given to:   Additional Notes: Pt hard of hearing. Has hearing aids

## 2020-10-07 NOTE — Plan of Care (Signed)
  Problem: Education: Goal: Knowledge of risk factors and measures for prevention of condition will improve Outcome: Progressing   Problem: Coping: Goal: Psychosocial and spiritual needs will be supported Outcome: Progressing   Problem: Respiratory: Goal: Will maintain a patent airway Outcome: Progressing Goal: Complications related to the disease process, condition or treatment will be avoided or minimized Outcome: Progressing   

## 2020-10-07 NOTE — H&P (Addendum)
History and Physical    Shari Lee WRU:[id number] DOB: Apr 19, 1956 DOA: 10/06/2020  PCP: Mar Daring, PA-C   Patient coming from: Home   Chief Complaint: Cough, SOB, nausea, malaise   HPI: Shari Lee is a 64 y.o. female with medical history significant for poorly controlled diabetes mellitus, hypertension, and BMI 45, now presenting to the emergency department with 1 week of nausea, general malaise, cough, and shortness of breath.  Patient was seen in the emergency department yesterday where she was found to have COVID-19, but was stable for discharge at that time.  Over the past day, her symptoms have continued to progress.  She denies any leg swelling or tenderness, denies hemoptysis, and reports some occasional loose stool without abdominal pain.  She was monitoring oxygen saturations at home and noted that they were in the 80s today.  ED Course: Upon arrival to the ED, patient is found to be afebrile, saturating mid to upper 80s on room air, tachypneic into the 30s, and with blood pressure 180/80.  EKG features a sinus rhythm and chest x-ray demonstrates mild to moderate severity bilateral infiltrates that have progressed from yesterday.  CBC features a glucose of 239.  CBC unremarkable.  Patient was started on Decadron and remdesivir in the ED, also placed on supplemental oxygen and given 500 cc of saline.  Review of Systems:  All other systems reviewed and apart from HPI, are negative.  Past Medical History:  Diagnosis Date  . Diabetes mellitus without complication ([place])   . Headache   . Hypertension     Past Surgical History:  Procedure Laterality Date  . ABDOMINAL HYSTERECTOMY  2006  . BREAST BIOPSY Left 2005   benign  . BREAST EXCISIONAL BIOPSY Left 2005?    -benign  . COLONOSCOPY WITH PROPOFOL N/A 08/13/2016   Procedure: COLONOSCOPY WITH PROPOFOL;  Surgeon: Lucilla Lame, MD;  Location: [facility];  Service: Endoscopy;  Laterality: N/A;  .  POLYPECTOMY  08/13/2016   Procedure: POLYPECTOMY;  Surgeon: Lucilla Lame, MD;  Location: [place];  Service: Endoscopy;;    Social History:   reports that she has never smoked. She has never used smokeless tobacco. She reports that she does not drink alcohol and does not use drugs.  Allergies  Allergen Reactions  . Amoxicillin-Pot Clavulanate   . Levsin  [Hyoscyamine]     Other reaction(s): Dizzyness  . Penicillins   . Metformin Diarrhea  . Metformin And Related Diarrhea    Family History  Problem Relation Age of Onset  . Heart attack Mother   . Diabetes Mother   . Heart disease Mother   . Heart failure Father   . Breast cancer Sister   . Sleep apnea Brother   . Diverticulitis Brother   . Diverticulitis Sister      Prior to Admission medications   Medication Sig Start Date End Date Taking? Authorizing Provider  albuterol (VENTOLIN HFA) 108 (90 Base) MCG/ACT inhaler Inhale 2 puffs into the lungs every 6 (six) hours as needed for wheezing or shortness of breath. 10/05/20  Yes Nance Pear, MD  amLODipine-benazepril (LOTREL) 5-20 MG capsule Take 1 capsule by mouth daily. 10/01/20  Yes Mar Daring, PA-C  atenolol (TENORMIN) 50 MG tablet Take 1 tablet (50 mg total) by mouth daily. 10/01/20  Yes Fenton Malling M, PA-C  benzonatate (TESSALON PERLES) 100 MG capsule Take 1 capsule (100 mg total) by mouth every 6 (six) hours as needed for cough. 10/05/20 10/05/21 Yes Nance Pear,  MD  Blood Glucose Monitoring Suppl (CONTOUR NEXT USB MONITOR) w/Device KIT 1 kit by Does not apply route daily. To check blood sugar once daily. 10/03/19  Yes Mar Daring, PA-C  CareTouch Safety Lancets 26G MISC 1 Device by Does not apply route daily. To check blood sugar once daily. 10/03/19  Yes Mar Daring, PA-C  Empagliflozin-metFORMIN HCl (SYNJARDY) 5-500 MG TABS Take 1 tablet by mouth 2 (two) times daily. 10/03/20  Yes Mar Daring, PA-C  glucose  blood (CONTOUR NEXT TEST) test strip To check blood sugar once daily. 10/03/19  Yes Fenton Malling M, PA-C  Insulin Pen Needle (NOVOFINE PLUS) 32G X 4 MM MISC To use with ozempic injections 01/31/19  Yes Burnette, Jennifer M, PA-C  ondansetron (ZOFRAN) 4 MG tablet Take 1 tablet (4 mg total) by mouth every 8 (eight) hours as needed. 10/05/20  Yes Nance Pear, MD  amLODipine-benazepril (LOTREL) 5-20 MG capsule Take 1 capsule by mouth once daily 01/18/19   Mar Daring, PA-C  atenolol (TENORMIN) 50 MG tablet Take 1 tablet by mouth once daily 01/15/19   Fenton Malling M, PA-C  glyBURIDE (DIABETA) 2.5 MG tablet Take 1 tablet by mouth twice daily 01/15/19   Mar Daring, [place]    Physical Exam: Vitals:   10/06/20 2122 10/06/20 2123 10/06/20 2130  BP: (!) 185/79  (!) 180/78  Pulse: 80  78  Resp: 12  (!) 30  Temp: 99.5 F (37.5 C)    TempSrc: Oral    SpO2: (!) 87%  94%  Weight:  90.7 kg   Height:  _0  (1.575 m)     Constitutional: NAD, calm  Eyes: PERTLA, lids and conjunctivae normal ENMT: Mucous membranes are moist. Posterior pharynx clear of any exudate or lesions.   Neck: normal, supple, no masses, no thyromegaly Respiratory: Tachypneic. Speaking full sentences. No pallor or cyanosis.  Cardiovascular: S1 & S2 heard, regular rate and rhythm. No extremity edema.   Abdomen: No distension, no tenderness, soft. Bowel sounds active.  Musculoskeletal: no clubbing / cyanosis. No joint deformity upper and lower extremities.   Skin: no significant rashes, lesions, ulcers. Warm, dry, well-perfused. Neurologic: CN 2-12 grossly intact. Sensation intact. Moving all extremities.  Psychiatric: Alert and oriented to person, place, and situation. Calm and cooperative.    Labs and Imaging on Admission: I have personally reviewed following labs and imaging studies  CBC: Recent Labs  Lab 10/01/20 1435 10/05/20 1342 10/06/20 2203  WBC 8.0 7.6 9.0  NEUTROABS 5.1  --  7.4   HGB 13.4 13.3 12.4  HCT 41.1 39.4 36.4  MCV 86 82.8 82.7  PLT 319 263 591   Basic Metabolic Panel: Recent Labs  Lab 10/01/20 1435 10/05/20 1342 10/05/20 1825 10/06/20 2203  NA 133* 127* 132* 130*  K 4.9 3.8 3.3* 4.1  CL 96 94* 102 97*  CO2 19* 17* 18* 22  GLUCOSE 259* 228* 162* 239*  BUN _1 CREATININE 1.13* 0.77 0.60 0.80  CALCIUM 9.2 8.3* 7.1* 8.3*   GFR: Estimated Creatinine Clearance: 75.3 mL/min (by C-G formula based on SCr of 0.8 mg/dL). Liver Function Tests: Recent Labs  Lab 10/01/20 1435  AST 27  ALT 22  ALKPHOS 91  BILITOT 0.2  PROT 7.0  ALBUMIN 4.3   No results for input(s): LIPASE, AMYLASE in the last 168 hours. No results for input(s): AMMONIA in the last 168 hours. Coagulation Profile: No results for input(s): INR, PROTIME in the last 168  hours. Cardiac Enzymes: No results for input(s): CKTOTAL, CKMB, CKMBINDEX, TROPONINI in the last 168 hours. BNP (last 3 results) No results for input(s): PROBNP in the last 8760 hours. HbA1C: No results for input(s): HGBA1C in the last 72 hours. CBG: No results for input(s): GLUCAP in the last 168 hours. Lipid Profile: No results for input(s): CHOL, HDL, LDLCALC, TRIG, CHOLHDL, LDLDIRECT in the last 72 hours. Thyroid Function Tests: No results for input(s): TSH, T4TOTAL, FREET4, T3FREE, THYROIDAB in the last 72 hours. Anemia Panel: No results for input(s): VITAMINB12, FOLATE, FERRITIN, TIBC, IRON, RETICCTPCT in the last 72 hours. Urine analysis:    Component Value Date/Time   COLORURINE YELLOW (A) 10/05/2020 1342   APPEARANCEUR HAZY (A) 10/05/2020 1342   LABSPEC 1.015 10/05/2020 1342   PHURINE 5.0 10/05/2020 1342   GLUCOSEU >=500 (A) 10/05/2020 1342   HGBUR NEGATIVE 10/05/2020 [place] 10/05/2020 1342   KETONESUR 80 (A) 10/05/2020 1342   PROTEINUR 30 (A) 10/05/2020 1342   NITRITE NEGATIVE 10/05/2020 1342   LEUKOCYTESUR NEGATIVE 10/05/2020 1342   Sepsis  Labs: _0 (procalcitonin:4,lacticidven:4) ) Recent Results (from the past 240 hour(s))  Resp Panel by RT-PCR (Flu A&B, Covid) Nasopharyngeal Swab     Status: Abnormal   Collection Time: 10/05/20  1:41 PM   Specimen: Nasopharyngeal Swab; Nasopharyngeal(NP) swabs in vial transport medium  Result Value Ref Range Status   SARS Coronavirus 2 by RT PCR POSITIVE (A) NEGATIVE Final    Comment: RESULT CALLED TO, READ BACK BY AND VERIFIED WITH: TAMMY JUDE 10/05/20 AT 1434 BY ACR (NOTE) SARS-CoV-2 target nucleic acids are DETECTED.  The SARS-CoV-2 RNA is generally detectable in upper respiratory specimens during the acute phase of infection. Positive results are indicative of the presence of the identified virus, but do not rule out bacterial infection or co-infection with other pathogens not detected by the test. Clinical correlation with patient history and other diagnostic information is necessary to determine patient infection status. The expected result is Negative.  Fact Sheet for Patients: EntrepreneurPulse.com.au  Fact Sheet for Healthcare Providers: IncredibleEmployment.be  This test is not yet approved or cleared by the [place] FDA and  has been authorized for detection and/or diagnosis of SARS-CoV-2 by FDA under an Emergency Use Authorization (EUA).  This EUA will remain in effect (meaning this test can b e used) for the duration of  the COVID-19 declaration under Section 564(b)(1) of the Act, 21 U.S.C. section 360bbb-3(b)(1), unless the authorization is terminated or revoked sooner.     Influenza A by PCR NEGATIVE NEGATIVE Final   Influenza B by PCR NEGATIVE NEGATIVE Final    Comment: (NOTE) The Xpert Xpress SARS-CoV-2/FLU/RSV plus assay is intended as an aid in the diagnosis of influenza from Nasopharyngeal swab specimens and should not be used as a sole basis for treatment. Nasal washings and aspirates are unacceptable for  Xpert Xpress SARS-CoV-2/FLU/RSV testing.  Fact Sheet for Patients: EntrepreneurPulse.com.au  Fact Sheet for Healthcare Providers: IncredibleEmployment.be  This test is not yet approved or cleared by the [place] FDA and has been authorized for detection and/or diagnosis of SARS-CoV-2 by FDA under an Emergency Use Authorization (EUA). This EUA will remain in effect (meaning this test can be used) for the duration of the COVID-19 declaration under Section 564(b)(1) of the Act, 21 U.S.C. section 360bbb-3(b)(1), unless the authorization is terminated or revoked.  Performed at [facility], [address], [place], [place] [address]      Radiological Exams on Admission: DG  Chest 2 View  Result Date: 10/05/2020 CLINICAL DATA:  Cough and weakness. EXAM: CHEST - 2 VIEW COMPARISON:  December 11, 2009 FINDINGS: Bibasilar infiltrates, right greater than left. No pneumothorax. The cardiomediastinal silhouette is stable and unremarkable. No other acute abnormalities. IMPRESSION: Bibasilar infiltrates, right greater than left, worrisome for pneumonia. Recommend short-term follow-up imaging after treatment to ensure resolution. Electronically Signed   By: Dorise Bullion III M.D   On: 10/05/2020 14:41   DG Chest Portable 1 View  Result Date: 10/06/2020 CLINICAL DATA:  COVID positive with shortness of breath. EXAM: PORTABLE CHEST 1 VIEW COMPARISON:  October 05, 2020 FINDINGS: Mild to moderate severity infiltrates are seen within the mid left lung and bilateral lung bases. This is mildly increased in severity when compared to the prior study. There is no evidence of a pleural effusion or pneumothorax. The heart size and mediastinal contours are within normal limits. There is mild calcification of the aortic arch. Degenerative changes seen throughout the thoracic spine. IMPRESSION: Mild to moderate severity bilateral infiltrates, mildly increased in  severity when compared to the prior study. Electronically Signed   By: Virgina Norfolk M.D.   On: 10/06/2020 22:01    EKG: Independently reviewed. Sinus rhythm.   Assessment/Plan   1. COVID-19 with acute hypoxic respiratory failure  - Unvaccinated patient presenting with 1 week of nausea, loose stools, malaise, and progressive cough and SOB, found to be tachypneic with RR in low 30s and sat upper 80s at rest  - CXR findings consistent with COVID pneumonia  - Started on supplemental O2, steroids, and remdesivir in ED  - Continue steroids and remdesivir, continue supplemental O2 as needed, trend markers   2. Insulin-dependent DM  - A1c was 13.5% earlier this month  - Follow CBGs closely while starting systemic steroid, use linagliptin and SSI for now    3. Hypertension  - BP elevated to 180/80 in ED  - Continue amlopdipine, ACE-i, and atenolol    DVT prophylaxis: Lovenox  Code Status: Full  Family Communication: Discussed with patient  Disposition Plan:  Patient is from: home  Anticipated d/c is to: Home  Anticipated d/c date is: 10/11/20 Patient currently: Pending improvement in respiratory status  Consults called: None  Admission status: Inpatient     Vianne Bulls, MD [facility]  10/07/2020, 12:05 AM

## 2020-10-07 NOTE — Progress Notes (Signed)
Inpatient Diabetes Program Recommendations  AACE/ADA: New Consensus Statement on Inpatient Glycemic Control (2015)  Target Ranges:  Prepandial:   less than 140 mg/dL      Peak postprandial:   less than 180 mg/dL (1-2 hours)      Critically ill patients:  140 - 180 mg/dL   Results for Shari Lee, Shari Lee (MRN [id number]) as of 10/07/2020 10:13  Ref. Range 10/01/2020 14:35  Hemoglobin A1C Latest Ref Range: 4.8 - 5.6 % 13.5 (H)  (341 mg/dl)   Results for Shari Lee, Shari Lee (MRN [id number]) as of 10/07/2020 10:13  Ref. Range 10/07/2020 00:26 10/07/2020 03:26 10/07/2020 08:24  Glucose-Capillary Latest Ref Range: 70 - 99 mg/dL  8 mg Decadron @10pm  209 (H)  3 units NOVOLOG  214 (H)  3 units NOVOLOG  236 (H)  7 units NOVOLOG     Admit with: COVID+  History: DM  Home DM Meds: Synjardy 5/500 mg BID       Ozempic (NOT taking)       Glyburide 2.5 mg BID (was told to Stop taking per PCP notes)  Current Orders: Novolog Resistant Correction Scale/ SSI (0-20 units) TID AC + HS      Tradjenta 5 mg Daily    Saw her  PCP Fenton Malling, PA with [facility] on 10/01/2020--A1c was elevated to 13.5% at that visit--Pt told her PCP she was not taking the Ozempic--PCP noted A1c was elevated and got pt to agree to start Synjardy 5/500 mg BID at that visit and to Stop the Glyburide  Getting Solumedrol 45.625 mg BID     MD- Please consider starting Levemir 10 units BID (0.1 units/kg)  per the COVID 19 glycemic control order set     --Will follow patient during hospitalization--  Wyn Quaker RN, MSN, CDE Diabetes Coordinator Inpatient Glycemic Control Team Team Pager: [phone] (8a-5p)

## 2020-10-07 NOTE — ED Notes (Signed)
Per pt daughter, pt does not manage her diabetes well or consistently at home

## 2020-10-08 DIAGNOSIS — E1165 Type 2 diabetes mellitus with hyperglycemia: Secondary | ICD-10-CM

## 2020-10-08 DIAGNOSIS — I10 Essential (primary) hypertension: Secondary | ICD-10-CM

## 2020-10-08 DIAGNOSIS — U071 COVID-19: Principal | ICD-10-CM

## 2020-10-08 DIAGNOSIS — J069 Acute upper respiratory infection, unspecified: Secondary | ICD-10-CM

## 2020-10-08 LAB — GLUCOSE, CAPILLARY
Glucose-Capillary: 376 mg/dL — ABNORMAL HIGH (ref 70–99)
Glucose-Capillary: 338 mg/dL — ABNORMAL HIGH (ref 70–99)
Glucose-Capillary: 275 mg/dL — ABNORMAL HIGH (ref 70–99)
Glucose-Capillary: 243 mg/dL — ABNORMAL HIGH (ref 70–99)
Glucose-Capillary: 245 mg/dL — ABNORMAL HIGH (ref 70–99)

## 2020-10-08 LAB — CBC WITH DIFFERENTIAL/PLATELET
Abs Immature Granulocytes: 0.11 10*3/uL — ABNORMAL HIGH (ref 0.00–0.07)
Basophils Absolute: 0 10*3/uL (ref 0.0–0.1)
Basophils Relative: 0 %
Eosinophils Absolute: 0 10*3/uL (ref 0.0–0.5)
Eosinophils Relative: 0 %
HCT: 37.3 % (ref 36.0–46.0)
Hemoglobin: 12.8 g/dL (ref 12.0–15.0)
Immature Granulocytes: 1 %
Lymphocytes Relative: 11 %
Lymphs Abs: 1.2 10*3/uL (ref 0.7–4.0)
MCH: 28.4 pg (ref 26.0–34.0)
MCHC: 34.3 g/dL (ref 30.0–36.0)
MCV: 82.9 fL (ref 80.0–100.0)
Monocytes Absolute: 0.6 10*3/uL (ref 0.1–1.0)
Monocytes Relative: 5 %
Neutro Abs: 9.3 10*3/uL — ABNORMAL HIGH (ref 1.7–7.7)
Neutrophils Relative %: 83 %
Platelets: 347 10*3/uL (ref 150–400)
RBC: 4.5 MIL/uL (ref 3.87–5.11)
RDW: 14.2 % (ref 11.5–15.5)
Smear Review: NORMAL
WBC: 11.3 10*3/uL — ABNORMAL HIGH (ref 4.0–10.5)
nRBC: 0 % (ref 0.0–0.2)

## 2020-10-08 LAB — COMPREHENSIVE METABOLIC PANEL
ALT: 22 U/L (ref 0–44)
AST: 34 U/L (ref 15–41)
Albumin: 2.7 g/dL — ABNORMAL LOW (ref 3.5–5.0)
Alkaline Phosphatase: 59 U/L (ref 38–126)
Anion gap: 11 (ref 5–15)
BUN: 28 mg/dL — ABNORMAL HIGH (ref 8–23)
CO2: 21 mmol/L — ABNORMAL LOW (ref 22–32)
Calcium: 8.3 mg/dL — ABNORMAL LOW (ref 8.9–10.3)
Chloride: 98 mmol/L (ref 98–111)
Creatinine, Ser: 0.71 mg/dL (ref 0.44–1.00)
GFR, Estimated: >60 mL/min (ref >60)
Glucose, Bld: 326 mg/dL — ABNORMAL HIGH (ref 70–99)
Potassium: 4 mmol/L (ref 3.5–5.1)
Sodium: 130 mmol/L — ABNORMAL LOW (ref 135–145)
Total Bilirubin: 0.6 mg/dL (ref 0.3–1.2)
Total Protein: 6.4 g/dL — ABNORMAL LOW (ref 6.5–8.1)

## 2020-10-08 LAB — C-REACTIVE PROTEIN: CRP: 7.6 mg/dL — ABNORMAL HIGH (ref <1.0)

## 2020-10-08 LAB — MAGNESIUM: Magnesium: 2.3 mg/dL (ref 1.7–2.4)

## 2020-10-08 LAB — FIBRIN DERIVATIVES D-DIMER (ARMC ONLY): Fibrin derivatives D-dimer (ARMC): 1683.88 ng/mL (FEU) — ABNORMAL HIGH (ref 0.00–499.00)

## 2020-10-08 LAB — PHOSPHORUS: Phosphorus: 4 mg/dL (ref 2.5–4.6)

## 2020-10-08 MED ORDER — BARICITINIB 2 MG PO TABS: 4.0000 mg | ORAL_TABLET | Freq: Every day | ORAL | Status: DC

## 2020-10-08 MED ORDER — INSULIN DETEMIR 100 UNIT/ML ~~LOC~~ SOLN
10.0000 [IU] | Freq: Two times a day (BID) | SUBCUTANEOUS | Status: DC
  Administered 2020-10-08 (×2): 10 [IU] via SUBCUTANEOUS
  Filled 2020-10-08 (×3): qty 0.1

## 2020-10-08 MED ORDER — BENZONATATE 100 MG PO CAPS
100.0000 mg | ORAL_CAPSULE | Freq: Three times a day (TID) | ORAL | Status: DC | PRN
  Administered 2020-10-08 – 2020-10-14 (×2): 100 mg via ORAL
  Filled 2020-10-08 (×2): qty 1

## 2020-10-08 NOTE — Progress Notes (Addendum)
Inpatient Diabetes Program Recommendations  AACE/ADA: New Consensus Statement on Inpatient Glycemic Control (2015)  Target Ranges:  Prepandial:   less than 140 mg/dL      Peak postprandial:   less than 180 mg/dL (1-2 hours)      Critically ill patients:  140 - 180 mg/dL   Results for BRODY, KUMP (MRN [id number]) as of 10/08/2020 07:15  Ref. Range 10/07/2020 08:24 10/07/2020 11:49 10/07/2020 16:23 10/07/2020 21:50  Glucose-Capillary Latest Ref Range: 70 - 99 mg/dL 236 (H)  7 units NOVOLOG  194 (H)  4 units NOVOLOG  288 (H)  11 units NOVOLOG  285 (H)  3 units NOVOLOG    Results for MARBETH, SMEDLEY (MRN [id number]) as of 10/08/2020 07:15  Ref. Range 10/08/2020 04:06  Glucose Latest Ref Range: 70 - 99 mg/dL 326 (H)    Admit with: COVID+  History: DM  Home DM Meds: Synjardy 5/500 mg BID                             Ozempic (NOT taking)                             Glyburide 2.5 mg BID (was told to Stop taking per PCP notes)  Current Orders: Novolog Resistant Correction Scale/ SSI (0-20 units) TID AC + HS                            Tradjenta 5 mg Daily    Saw her  PCP Fenton Malling, PA with [facility] on 10/01/2020--A1c was elevated to 13.5% at that visit--Pt told her PCP she was not taking the Ozempic--PCP noted A1c was elevated and got pt to agree to start Synjardy 5/500 mg BID at that visit and to Stop the Glyburide  Getting Solumedrol 45.625 mg BID     MD- Please consider starting Levemir 10 units BID  per the COVID 19 glycemic control order set    Addendum 10:45am--Called pt by phone today (pt on airborne precautions) to discuss most recent A1c of 13.5% at her last PCP visit.  Pt stated she was not feeling well today and that her O2 sats dropped last night and she did not sleep well.  Kept conversation brief due to pt not feeling well.  Pt told me was given new Rx for Synjardy by her PCP at visit on 10/01/2020, however unsure how  many doses she took before admission.  Stopped the Ozempic once weekly injection several months ago stating the Ozempic "didn't work".  I explained to pt that we have her on insulin in the hospital due to her CBGs being elevated from illness and steroids.  Briefly explained what Novolog is, how it works, and how we are determining doses.  Ended conversation since pt sounded very tired.  Pt did give me permission to call her daughter Arman Bogus with a diabetes update.  Called Arman Bogus, pt's daughter.  Explained all of the above with daughter.  Daughter told me her Mom has not been taking good care of her diabetes for a while.  Daughter told me pt's A1c was around 13% about a year ago and that pt did a good job losing weight and exercising.  A1c came down but daughter could not remember the level it came down to.  Per daughter, patient stopped the Ozempic b/c pt said  it wasn't helping her CBGs.  Daughter told me pt adamantly refuses to start insulin for home stating that the insulin will make her feel worse.  I reviewed pt's current A1c with daughter and explained to daughter that given pt's infection with the COVID virus, along with steroid treatment and poor glucose prior to admission, that pt will require insulin in the hospital.  Explained what Novolog is and how it works and how we are dosing.  Explained what Lady [place] is and how it works.  Discussed with daughter that pt may require insulin when she goes home but that pt does have the right to refuse the insulin when she goes home.  Discussed with daughter that I will try to touch base with pt again tomorrow when she is feeling better.  Will email both daughter and patient a Youtube video of how to use an insulin pen in hopes that pt will see how easy giving an injection is with an insulin pen.  Also asked daughter to discuss with pt using insulin when pt feels better.     --Will follow patient during hospitalization--  Wyn Quaker  RN, MSN, CDE Diabetes Coordinator Inpatient Glycemic Control Team Team Pager: [phone] (8a-5p)

## 2020-10-08 NOTE — Progress Notes (Addendum)
Progress Note    Shari Lee  [id number] DOB: 1955/10/20  DOA: 10/06/2020 PCP: Mar Daring, PA-C      Brief Narrative:    Medical records reviewed and are as summarized below:  Shari Lee is a 64 y.o. female with past medical history significant for type 2 diabetes mellitus, essential hypertension, obesity who presented to the emergency department with 1 week history of nausea, generalized malaise, cough and shortness of breath.      Assessment/Plan:   Principal Problem:   Acute respiratory disease due to COVID-19 virus Active Problems:   Diabetes mellitus type 2, uncontrolled ([place])   Essential (primary) hypertension    Body mass index is 36.58 kg/m.  (Morbid obesity)   Acute hypoxic respiratory failure secondary to acute Covid-19 viral pneumonia during the ongoing 2020/2021 Covid 19 Pandemic - POA --COVID test: + PCR 10/05/2020 -Continue IV steroids. --prone for 2-3hrs every 12hrs if able --Continue oxygen therapy.  She is on 9 L/min via HFNC. --Continue supportive care  --Follow CBC, CMP, D-dimer, ferritin, and CRP daily --Continue airborne/contact isolation precautions for 3 weeks from the day of diagnosis --Discussed treatment with baricitinib.  Risk and benefits of baricitinib for were discussed and she is agreeable to proceed with treatment if required.  Type 2 diabetes mellitus, poorly controlled Home regimen includes empagliflozin-metformin 5-500mg  BID.  Hemoglobin A1c 13.5 on 10/01/2020. --Hold oral hypoglycemics while inpatient --Start Levemir 10 units twice daily. --Tradjenta 5 mg p.o. daily --NovoLog as needed for hyperglycemia.  Essential hypertension --Continue antihypertensives and monitor BP closely.   Plan of care was discussed with her daughter, Alyse Low, over the phone.   Diet Order            Diet heart healthy/carb modified Room service appropriate? Yes; Fluid consistency: Thin  Diet effective now                     Consultants:  None  Procedures:  None    Medications:   . amLODipine  5 mg Oral Daily   And  . benazepril  20 mg Oral Daily  . vitamin C  500 mg Oral Daily  . atenolol  50 mg Oral Daily  . enoxaparin (LOVENOX) injection  40 mg Subcutaneous Q24H  . insulin aspart  0-20 Units Subcutaneous TID WC  . insulin aspart  0-5 Units Subcutaneous QHS  . insulin detemir  10 Units Subcutaneous BID  . linagliptin  5 mg Oral Daily  . methylPREDNISolone (SOLU-MEDROL) injection  0.5 mg/kg Intravenous Q12H   Followed by  . [START ON 10/10/2020] predniSONE  50 mg Oral Daily  . sodium chloride flush  3 mL Intravenous Q12H  . zinc sulfate  220 mg Oral Daily   Continuous Infusions: . sodium chloride    . remdesivir 100 mg in NS 100 mL 100 mg (10/08/20 1031)     Anti-infectives (From admission, onward)   Start     Dose/Rate Route Frequency Ordered Stop   10/07/20 1000  remdesivir 100 mg in sodium chloride 0.9 % 100 mL IVPB       "Followed by" Linked Group Details   100 mg 200 mL/hr over 30 Minutes Intravenous Daily 10/06/20 2217 10/11/20 0959   10/06/20 2300  remdesivir 200 mg in sodium chloride 0.9% 250 mL IVPB       "Followed by" Linked Group Details   200 mg 580 mL/hr over 30 Minutes Intravenous Once 10/06/20 2217 10/07/20 0129  Family Communication/Anticipated D/C date and plan/Code Status   DVT prophylaxis: enoxaparin (LOVENOX) injection 40 mg Start: 10/07/20 1000     Code Status: Full Code  Family Communication: Plan of care discussed with Alyse Low, daughter Disposition Plan:    Status is: Inpatient  Remains inpatient appropriate because:severe hypoxia   Dispo: The patient is from: Home              Anticipated d/c is to: Home              Anticipated d/c date is: 3 days              Patient currently is not medically stable to d/c.           Subjective:   C/o fatigue and shortness of breath  Objective:    Vitals:    10/08/20 0426 10/08/20 0701 10/08/20 0911 10/08/20 1151  BP: (!) 147/67  (!) 132/59 119/63  Pulse: (!) 59  73 66  Resp: 20  20 20   Temp: 97.6 F (36.4 C)  98.6 F (37 C) (!) 97.4 F (36.3 C)  TempSrc:   Oral   SpO2: 92% 95% 91% 90%  Weight:      Height:       No data found.   Intake/Output Summary (Last 24 hours) at 10/08/2020 1246 Last data filed at 10/07/2020 1751 Gross per 24 hour  Intake 100 ml  Output --  Net 100 ml   Filed Weights   10/06/20 2123  Weight: 90.7 kg    Exam:  GEN: NAD SKIN: Warm and dry EYES: EOMI ENT: MMM CV: RRR PULM: CTA B ABD: soft, obese, NT, +BS CNS: AAO x 3, non focal EXT: No edema or tenderness   Data Reviewed:   I have personally reviewed following labs and imaging studies:  Labs: Labs show the following:   Basic Metabolic Panel: Recent Labs  Lab 10/01/20 1435 10/05/20 1342 10/05/20 1825 10/06/20 2203 10/08/20 0406  NA 133* 127* 132* 130* 130*  K 4.9 3.8 3.3* 4.1 4.0  CL 96 94* 102 97* 98  CO2 19* 17* 18* 22 21*  GLUCOSE 259* 228* 162* 239* 326*  BUN 21 10 10 10  28*  CREATININE 1.13* 0.77 0.60 0.80 0.71  CALCIUM 9.2 8.3* 7.1* 8.3* 8.3*  MG  --   --   --   --  2.3  PHOS  --   --   --   --  4.0   GFR Estimated Creatinine Clearance: 75.3 mL/min (by C-G formula based on SCr of 0.71 mg/dL). Liver Function Tests: Recent Labs  Lab 10/01/20 1435 10/08/20 0406  AST 27 34  ALT 22 22  ALKPHOS 91 59  BILITOT 0.2 0.6  PROT 7.0 6.4*  ALBUMIN 4.3 2.7*   No results for input(s): LIPASE, AMYLASE in the last 168 hours. No results for input(s): AMMONIA in the last 168 hours. Coagulation profile No results for input(s): INR, PROTIME in the last 168 hours.  CBC: Recent Labs  Lab 10/01/20 1435 10/05/20 1342 10/06/20 2203 10/08/20 0406  WBC 8.0 7.6 9.0 11.3*  NEUTROABS 5.1  --  7.4 9.3*  HGB 13.4 13.3 12.4 12.8  HCT 41.1 39.4 36.4 37.3  MCV 86 82.8 82.7 82.9  PLT 319 263 278 347   Cardiac Enzymes: No results  for input(s): CKTOTAL, CKMB, CKMBINDEX, TROPONINI in the last 168 hours. BNP (last 3 results) No results for input(s): PROBNP in the last 8760 hours. CBG: Recent Labs  Lab 10/07/20 1149 10/07/20 1623 10/07/20 2150 10/08/20 0910 10/08/20 1149  GLUCAP 194* 288* 285* 376* 338*   D-Dimer: No results for input(s): DDIMER in the last 72 hours. Hgb A1c: No results for input(s): HGBA1C in the last 72 hours. Lipid Profile: No results for input(s): CHOL, HDL, LDLCALC, TRIG, CHOLHDL, LDLDIRECT in the last 72 hours. Thyroid function studies: No results for input(s): TSH, T4TOTAL, T3FREE, THYROIDAB in the last 72 hours.  Invalid input(s): FREET3 Anemia work up: No results for input(s): VITAMINB12, FOLATE, FERRITIN, TIBC, IRON, RETICCTPCT in the last 72 hours. Sepsis Labs: Recent Labs  Lab 10/01/20 1435 10/05/20 1342 10/06/20 2203 10/07/20 0422 10/08/20 0406  PROCALCITON  --   --   --  <0.10  --   WBC 8.0 7.6 9.0  --  11.3*    Microbiology Recent Results (from the past 240 hour(s))  Resp Panel by RT-PCR (Flu A&B, Covid) Nasopharyngeal Swab     Status: Abnormal   Collection Time: 10/05/20  1:41 PM   Specimen: Nasopharyngeal Swab; Nasopharyngeal(NP) swabs in vial transport medium  Result Value Ref Range Status   SARS Coronavirus 2 by RT PCR POSITIVE (A) NEGATIVE Final    Comment: RESULT CALLED TO, READ BACK BY AND VERIFIED WITH: TAMMY JUDE 10/05/20 AT 1434 BY ACR (NOTE) SARS-CoV-2 target nucleic acids are DETECTED.  The SARS-CoV-2 RNA is generally detectable in upper respiratory specimens during the acute phase of infection. Positive results are indicative of the presence of the identified virus, but do not rule out bacterial infection or co-infection with other pathogens not detected by the test. Clinical correlation with patient history and other diagnostic information is necessary to determine patient infection status. The expected result is Negative.  Fact Sheet for  Patients: EntrepreneurPulse.com.au  Fact Sheet for Healthcare Providers: IncredibleEmployment.be  This test is not yet approved or cleared by the [place] FDA and  has been authorized for detection and/or diagnosis of SARS-CoV-2 by FDA under an Emergency Use Authorization (EUA).  This EUA will remain in effect (meaning this test can b e used) for the duration of  the COVID-19 declaration under Section 564(b)(1) of the Act, 21 U.S.C. section 360bbb-3(b)(1), unless the authorization is terminated or revoked sooner.     Influenza A by PCR NEGATIVE NEGATIVE Final   Influenza B by PCR NEGATIVE NEGATIVE Final    Comment: (NOTE) The Xpert Xpress SARS-CoV-2/FLU/RSV plus assay is intended as an aid in the diagnosis of influenza from Nasopharyngeal swab specimens and should not be used as a sole basis for treatment. Nasal washings and aspirates are unacceptable for Xpert Xpress SARS-CoV-2/FLU/RSV testing.  Fact Sheet for Patients: EntrepreneurPulse.com.au  Fact Sheet for Healthcare Providers: IncredibleEmployment.be  This test is not yet approved or cleared by the [place] FDA and has been authorized for detection and/or diagnosis of SARS-CoV-2 by FDA under an Emergency Use Authorization (EUA). This EUA will remain in effect (meaning this test can be used) for the duration of the COVID-19 declaration under Section 564(b)(1) of the Act, 21 U.S.C. section 360bbb-3(b)(1), unless the authorization is terminated or revoked.  Performed at [facility], [address] [place],  [address]     Procedures and diagnostic studies:  DG Chest Portable 1 View  Result Date: 10/06/2020 CLINICAL DATA:  COVID positive with shortness of breath. EXAM: PORTABLE CHEST 1 VIEW COMPARISON:  October 05, 2020 FINDINGS: Mild to moderate severity infiltrates are seen within the mid left lung and bilateral lung  bases. This is  mildly increased in severity when compared to the prior study. There is no evidence of a pleural effusion or pneumothorax. The heart size and mediastinal contours are within normal limits. There is mild calcification of the aortic arch. Degenerative changes seen throughout the thoracic spine. IMPRESSION: Mild to moderate severity bilateral infiltrates, mildly increased in severity when compared to the prior study. Electronically Signed   By: Virgina Norfolk M.D.   On: 10/06/2020 22:01               LOS: 2 days   Kishana Battey  [facility]   Pager on [email or web address]. If 7PM-7AM, please contact night-coverage at [email or web address]     10/08/2020, 12:46 PM

## 2020-10-09 LAB — CBC WITH DIFFERENTIAL/PLATELET
Abs Immature Granulocytes: 0.15 10*3/uL — ABNORMAL HIGH (ref 0.00–0.07)
Basophils Absolute: 0 10*3/uL (ref 0.0–0.1)
Basophils Relative: 0 %
Eosinophils Absolute: 0 10*3/uL (ref 0.0–0.5)
Eosinophils Relative: 0 %
HCT: 38.6 % (ref 36.0–46.0)
Hemoglobin: 13.1 g/dL (ref 12.0–15.0)
Immature Granulocytes: 1 %
Lymphocytes Relative: 9 %
Lymphs Abs: 1.4 10*3/uL (ref 0.7–4.0)
MCH: 28.2 pg (ref 26.0–34.0)
MCHC: 33.9 g/dL (ref 30.0–36.0)
MCV: 83 fL (ref 80.0–100.0)
Monocytes Absolute: 0.7 10*3/uL (ref 0.1–1.0)
Monocytes Relative: 5 %
Neutro Abs: 13 10*3/uL — ABNORMAL HIGH (ref 1.7–7.7)
Neutrophils Relative %: 85 %
Platelets: 423 10*3/uL — ABNORMAL HIGH (ref 150–400)
RBC: 4.65 MIL/uL (ref 3.87–5.11)
RDW: 13.8 % (ref 11.5–15.5)
Smear Review: NORMAL
WBC: 15.3 10*3/uL — ABNORMAL HIGH (ref 4.0–10.5)
nRBC: 0 % (ref 0.0–0.2)

## 2020-10-09 LAB — COMPREHENSIVE METABOLIC PANEL
ALT: 25 U/L (ref 0–44)
AST: 36 U/L (ref 15–41)
Albumin: 2.7 g/dL — ABNORMAL LOW (ref 3.5–5.0)
Alkaline Phosphatase: 59 U/L (ref 38–126)
Anion gap: 8 (ref 5–15)
BUN: 25 mg/dL — ABNORMAL HIGH (ref 8–23)
CO2: 26 mmol/L (ref 22–32)
Calcium: 8.5 mg/dL — ABNORMAL LOW (ref 8.9–10.3)
Chloride: 101 mmol/L (ref 98–111)
Creatinine, Ser: 0.63 mg/dL (ref 0.44–1.00)
GFR, Estimated: >60 mL/min (ref >60)
Glucose, Bld: 257 mg/dL — ABNORMAL HIGH (ref 70–99)
Potassium: 4.4 mmol/L (ref 3.5–5.1)
Sodium: 135 mmol/L (ref 135–145)
Total Bilirubin: 0.5 mg/dL (ref 0.3–1.2)
Total Protein: 6.3 g/dL — ABNORMAL LOW (ref 6.5–8.1)

## 2020-10-09 LAB — GLUCOSE, CAPILLARY
Glucose-Capillary: 289 mg/dL — ABNORMAL HIGH (ref 70–99)
Glucose-Capillary: 313 mg/dL — ABNORMAL HIGH (ref 70–99)
Glucose-Capillary: 236 mg/dL — ABNORMAL HIGH (ref 70–99)
Glucose-Capillary: 204 mg/dL — ABNORMAL HIGH (ref 70–99)

## 2020-10-09 LAB — FIBRIN DERIVATIVES D-DIMER (ARMC ONLY): Fibrin derivatives D-dimer (ARMC): 1479.73 ng/mL (FEU) — ABNORMAL HIGH (ref 0.00–499.00)

## 2020-10-09 LAB — C-REACTIVE PROTEIN: CRP: 3.6 mg/dL — ABNORMAL HIGH (ref <1.0)

## 2020-10-09 MED ORDER — ENOXAPARIN SODIUM 60 MG/0.6ML ~~LOC~~ SOLN
0.5000 mg/kg | SUBCUTANEOUS | Status: DC
  Administered 2020-10-10 – 2020-10-14 (×5): 45 mg via SUBCUTANEOUS
  Filled 2020-10-09 (×5): qty 0.6

## 2020-10-09 MED ORDER — INSULIN DETEMIR 100 UNIT/ML ~~LOC~~ SOLN
20.0000 [IU] | Freq: Two times a day (BID) | SUBCUTANEOUS | Status: DC
  Administered 2020-10-09: 22:00:00 20 [IU] via SUBCUTANEOUS
  Filled 2020-10-09 (×2): qty 0.2

## 2020-10-09 MED ORDER — INSULIN DETEMIR 100 UNIT/ML ~~LOC~~ SOLN
12.0000 [IU] | Freq: Two times a day (BID) | SUBCUTANEOUS | Status: DC
  Administered 2020-10-09: 12 [IU] via SUBCUTANEOUS
  Filled 2020-10-09 (×2): qty 0.12

## 2020-10-09 NOTE — Progress Notes (Signed)
Inpatient Diabetes Program Recommendations  AACE/ADA: New Consensus Statement on Inpatient Glycemic Control (2015)  Target Ranges:  Prepandial:   less than 140 mg/dL      Peak postprandial:   less than 180 mg/dL (1-2 hours)      Critically ill patients:  140 - 180 mg/dL   Results for Shari Lee, Shari Lee (MRN [id number]) as of 10/09/2020 14:47  Ref. Range 10/08/2020 09:10 10/08/2020 11:49 10/08/2020 16:17 10/08/2020 16:54 10/08/2020 20:51  Glucose-Capillary Latest Ref Range: 70 - 99 mg/dL 376 (H)  20 units NOVOLOG  338 (H)  15 units NOVOLOG  275 (H) 243 (H)  11 units NOVOLOG  10 units LEVEMIR  245 (H)  2 units NOVOLOG  10 units LEVEMIR   Results for Shari Lee, Shari Lee (MRN [id number]) as of 10/09/2020 14:47  Ref. Range 10/09/2020 08:27 10/09/2020 12:02  Glucose-Capillary Latest Ref Range: 70 - 99 mg/dL 289 (H)  11 units NOVOLOG  12 units LEVEMIR  313 (H)  15 units NOVOLOG     Admit with:COVID+  History:DM  Home DM Meds:Synjardy 5/500 mg BID Ozempic (NOT taking) Glyburide 2.5 mg BID (was told to Stop taking per PCP notes)  Current Orders:Novolog Resistant Correction Scale/ SSI (0-20 units) TID AC + HS Tradjenta 5 mg Daily      Levemir 20 units BID    Saw her PCP Fenton Malling, PA with [facility] on 10/01/2020--A1c was elevated to 13.5% at that visit--Pt told her PCP she was not taking the Ozempic--PCP noted A1c was elevated and got pt to agree to start Synjardy 5/500 mg BID at that visit and to Stop the Glyburide  Getting Solumedrol 45.625 mg BID--Likely to switch to Prednisone tomorrow  Note Levemir increased this afternoon   Attempted to call pt by phone X 3 today without success.  Had brief conversation with pt yesterday and also spoke with pt's daughter yesterday at length about the possibility of pt needing Insulin for home at time of d/c.  Emailed  pt's daughter and patient a video of using insulin pens at home.  Have secure chatted with the RN today requesting them to begin insulin teaching as well.   --Will follow patient during hospitalization--  Wyn Quaker RN, MSN, CDE Diabetes Coordinator Inpatient Glycemic Control Team Team Pager: [phone] (8a-5p)

## 2020-10-09 NOTE — Progress Notes (Addendum)
Progress Note    Shari Lee  [id number] DOB: 26-Nov-1955  DOA: 10/06/2020 PCP: Mar Daring, PA-C      Brief Narrative:    Medical records reviewed and are as summarized below:  Shari Lee is a 64 y.o. female with past medical history significant for type 2 diabetes mellitus, essential hypertension, obesity who presented to the emergency department with 1 week history of nausea, generalized malaise, cough and shortness of breath.      Assessment/Plan:   Principal Problem:   Acute respiratory disease due to COVID-19 virus Active Problems:   Diabetes mellitus type 2, uncontrolled ([place])   Essential (primary) hypertension    Body mass index is 36.58 kg/m.  (Morbid obesity)   Acute hypoxic respiratory failure secondary to acute Covid-19 viral pneumonia --COVID test: + PCR 10/05/2020 -Continue IV steroids. --prone for 2-3hrs every 12hrs if able --Continue oxygen via nasal cannula. Oxygen requirement is down from 11 L/min to 6 L/min via HFNC. --Continue supportive care -- --Continue airborne/contact isolation precautions for 3 weeks from the day of diagnosis Holding off on baricitinib for now since oxygenation is improving.  Type 2 diabetes mellitus, poorly controlled Home regimen includes empagliflozin-metformin 5-500mg  BID.  Hemoglobin A1c 13.5 on 10/01/2020. --Hold oral hypoglycemics while inpatient --Worsening hyperglycemia likely due to steroids. --Increase Levemir to 20 units twice daily (from 10 units) --Tradjenta 5 mg p.o. daily --NovoLog as needed for hyperglycemia.  Essential hypertension --Continue antihypertensives and monitor BP closely.     Diet Order            Diet heart healthy/carb modified Room service appropriate? Yes; Fluid consistency: Thin  Diet effective now                    Consultants:  None  Procedures:  None    Medications:   . amLODipine  5 mg Oral Daily   And  . benazepril  20 mg  Oral Daily  . vitamin C  500 mg Oral Daily  . atenolol  50 mg Oral Daily  . [START ON 10/10/2020] enoxaparin (LOVENOX) injection  0.5 mg/kg Subcutaneous Q24H  . insulin aspart  0-20 Units Subcutaneous TID WC  . insulin aspart  0-5 Units Subcutaneous QHS  . insulin detemir  12 Units Subcutaneous BID  . linagliptin  5 mg Oral Daily  . methylPREDNISolone (SOLU-MEDROL) injection  0.5 mg/kg Intravenous Q12H   Followed by  . [START ON 10/10/2020] predniSONE  50 mg Oral Daily  . sodium chloride flush  3 mL Intravenous Q12H  . zinc sulfate  220 mg Oral Daily   Continuous Infusions: . sodium chloride    . remdesivir 100 mg in NS 100 mL 100 mg (10/09/20 0858)     Anti-infectives (From admission, onward)   Start     Dose/Rate Route Frequency Ordered Stop   10/07/20 1000  remdesivir 100 mg in sodium chloride 0.9 % 100 mL IVPB       "Followed by" Linked Group Details   100 mg 200 mL/hr over 30 Minutes Intravenous Daily 10/06/20 2217 10/11/20 0959   10/06/20 2300  remdesivir 200 mg in sodium chloride 0.9% 250 mL IVPB       "Followed by" Linked Group Details   200 mg 580 mL/hr over 30 Minutes Intravenous Once 10/06/20 2217 10/07/20 0129             Family Communication/Anticipated D/C date and plan/Code Status   DVT prophylaxis:  Code Status: Full Code  Family Communication: Plan discussed with Alyse Low, her daughter. Disposition Plan:    Status is: Inpatient  Remains inpatient appropriate because:severe hypoxia   Dispo: The patient is from: Home              Anticipated d/c is to: Home              Anticipated d/c date is: 3 days              Patient currently is not medically stable to d/c.           Subjective:   Interval events noted. No new complaints. She feels short of breath with little activity.  Objective:    Vitals:   10/08/20 2052 10/09/20 0050 10/09/20 0519 10/09/20 0829  BP: 133/64 135/73 127/63 (!) 141/71  Pulse: 67 63 (!) 59 72   Resp: 16 16 16 19   Temp: (!) 97.4 F (36.3 C) 97.8 F (36.6 C) 98.1 F (36.7 C) 98.6 F (37 C)  TempSrc:      SpO2: (!) 89% 93% 92% 90%  Weight:      Height:       No data found.   Intake/Output Summary (Last 24 hours) at 10/09/2020 1105 Last data filed at 10/08/2020 1300 Gross per 24 hour  Intake 0 ml  Output --  Net 0 ml   Filed Weights   10/06/20 2123  Weight: 90.7 kg    Exam:  GEN: NAD SKIN: Warm and dry EYES: No pallor or icterus ENT: MMM CV: RRR PULM: bibasilar rales, no wheezing ABD: soft, obese, NT, +BS CNS: AAO x 3, non focal EXT: No edema or tenderness    Data Reviewed:   I have personally reviewed following labs and imaging studies:  Labs: Labs show the following:   Basic Metabolic Panel: Recent Labs  Lab 10/05/20 1342 10/05/20 1825 10/06/20 2203 10/08/20 0406 10/09/20 0423  NA 127* 132* 130* 130* 135  K 3.8 3.3* 4.1 4.0 4.4  CL 94* 102 97* 98 101  CO2 17* 18* 22 21* 26  GLUCOSE 228* 162* 239* 326* 257*  BUN 10 10 10  28* 25*  CREATININE 0.77 0.60 0.80 0.71 0.63  CALCIUM 8.3* 7.1* 8.3* 8.3* 8.5*  MG  --   --   --  2.3  --   PHOS  --   --   --  4.0  --    GFR Estimated Creatinine Clearance: 75.3 mL/min (by C-G formula based on SCr of 0.63 mg/dL). Liver Function Tests: Recent Labs  Lab 10/08/20 0406 10/09/20 0423  AST 34 36  ALT 22 25  ALKPHOS 59 59  BILITOT 0.6 0.5  PROT 6.4* 6.3*  ALBUMIN 2.7* 2.7*   No results for input(s): LIPASE, AMYLASE in the last 168 hours. No results for input(s): AMMONIA in the last 168 hours. Coagulation profile No results for input(s): INR, PROTIME in the last 168 hours.  CBC: Recent Labs  Lab 10/05/20 1342 10/06/20 2203 10/08/20 0406 10/09/20 0423  WBC 7.6 9.0 11.3* 15.3*  NEUTROABS  --  7.4 9.3* 13.0*  HGB 13.3 12.4 12.8 13.1  HCT 39.4 36.4 37.3 38.6  MCV 82.8 82.7 82.9 83.0  PLT 263 278 347 423*   Cardiac Enzymes: No results for input(s): CKTOTAL, CKMB, CKMBINDEX, TROPONINI in  the last 168 hours. BNP (last 3 results) No results for input(s): PROBNP in the last 8760 hours. CBG: Recent Labs  Lab 10/08/20 1149 10/08/20 1617 10/08/20 1654  10/08/20 2051 10/09/20 0827  GLUCAP 338* 275* 243* 245* 289*   D-Dimer: No results for input(s): DDIMER in the last 72 hours. Hgb A1c: No results for input(s): HGBA1C in the last 72 hours. Lipid Profile: No results for input(s): CHOL, HDL, LDLCALC, TRIG, CHOLHDL, LDLDIRECT in the last 72 hours. Thyroid function studies: No results for input(s): TSH, T4TOTAL, T3FREE, THYROIDAB in the last 72 hours.  Invalid input(s): FREET3 Anemia work up: No results for input(s): VITAMINB12, FOLATE, FERRITIN, TIBC, IRON, RETICCTPCT in the last 72 hours. Sepsis Labs: Recent Labs  Lab 10/05/20 1342 10/06/20 2203 10/07/20 0422 10/08/20 0406 10/09/20 0423  PROCALCITON  --   --  <0.10  --   --   WBC 7.6 9.0  --  11.3* 15.3*    Microbiology Recent Results (from the past 240 hour(s))  Resp Panel by RT-PCR (Flu A&B, Covid) Nasopharyngeal Swab     Status: Abnormal   Collection Time: 10/05/20  1:41 PM   Specimen: Nasopharyngeal Swab; Nasopharyngeal(NP) swabs in vial transport medium  Result Value Ref Range Status   SARS Coronavirus 2 by RT PCR POSITIVE (A) NEGATIVE Final    Comment: RESULT CALLED TO, READ BACK BY AND VERIFIED WITH: TAMMY JUDE 10/05/20 AT 1434 BY ACR (NOTE) SARS-CoV-2 target nucleic acids are DETECTED.  The SARS-CoV-2 RNA is generally detectable in upper respiratory specimens during the acute phase of infection. Positive results are indicative of the presence of the identified virus, but do not rule out bacterial infection or co-infection with other pathogens not detected by the test. Clinical correlation with patient history and other diagnostic information is necessary to determine patient infection status. The expected result is Negative.  Fact Sheet for  Patients: EntrepreneurPulse.com.au  Fact Sheet for Healthcare Providers: IncredibleEmployment.be  This test is not yet approved or cleared by the [place] FDA and  has been authorized for detection and/or diagnosis of SARS-CoV-2 by FDA under an Emergency Use Authorization (EUA).  This EUA will remain in effect (meaning this test can b e used) for the duration of  the COVID-19 declaration under Section 564(b)(1) of the Act, 21 U.S.C. section 360bbb-3(b)(1), unless the authorization is terminated or revoked sooner.     Influenza A by PCR NEGATIVE NEGATIVE Final   Influenza B by PCR NEGATIVE NEGATIVE Final    Comment: (NOTE) The Xpert Xpress SARS-CoV-2/FLU/RSV plus assay is intended as an aid in the diagnosis of influenza from Nasopharyngeal swab specimens and should not be used as a sole basis for treatment. Nasal washings and aspirates are unacceptable for Xpert Xpress SARS-CoV-2/FLU/RSV testing.  Fact Sheet for Patients: EntrepreneurPulse.com.au  Fact Sheet for Healthcare Providers: IncredibleEmployment.be  This test is not yet approved or cleared by the [place] FDA and has been authorized for detection and/or diagnosis of SARS-CoV-2 by FDA under an Emergency Use Authorization (EUA). This EUA will remain in effect (meaning this test can be used) for the duration of the COVID-19 declaration under Section 564(b)(1) of the Act, 21 U.S.C. section 360bbb-3(b)(1), unless the authorization is terminated or revoked.  Performed at [facility], [address] [place], [place] [address]     Procedures and diagnostic studies:  No results found.             LOS: 3 days   Greysen Swanton  [facility]   Pager on [email or web address]. If 7PM-7AM, please contact night-coverage at [email or web address]     10/09/2020, 11:05 AM

## 2020-10-09 NOTE — Progress Notes (Signed)
CSW called patient's daughter per her request. She had questions about starting a Medicaid application to patient. CSW Advice worker who will follow up with patient's daughter.  Oleh Genin, [place]

## 2020-10-10 LAB — COMPREHENSIVE METABOLIC PANEL
ALT: 35 U/L (ref 0–44)
AST: 47 U/L — ABNORMAL HIGH (ref 15–41)
Albumin: 2.6 g/dL — ABNORMAL LOW (ref 3.5–5.0)
Alkaline Phosphatase: 59 U/L (ref 38–126)
Anion gap: 10 (ref 5–15)
BUN: 24 mg/dL — ABNORMAL HIGH (ref 8–23)
CO2: 25 mmol/L (ref 22–32)
Calcium: 8.2 mg/dL — ABNORMAL LOW (ref 8.9–10.3)
Chloride: 101 mmol/L (ref 98–111)
Creatinine, Ser: 0.65 mg/dL (ref 0.44–1.00)
GFR, Estimated: >60 mL/min (ref >60)
Glucose, Bld: 258 mg/dL — ABNORMAL HIGH (ref 70–99)
Potassium: 4 mmol/L (ref 3.5–5.1)
Sodium: 136 mmol/L (ref 135–145)
Total Bilirubin: 0.9 mg/dL (ref 0.3–1.2)
Total Protein: 5.9 g/dL — ABNORMAL LOW (ref 6.5–8.1)

## 2020-10-10 LAB — CBC WITH DIFFERENTIAL/PLATELET
Abs Immature Granulocytes: 0.24 10*3/uL — ABNORMAL HIGH (ref 0.00–0.07)
Basophils Absolute: 0 10*3/uL (ref 0.0–0.1)
Basophils Relative: 0 %
Eosinophils Absolute: 0 10*3/uL (ref 0.0–0.5)
Eosinophils Relative: 0 %
HCT: 37.7 % (ref 36.0–46.0)
Hemoglobin: 12.9 g/dL (ref 12.0–15.0)
Immature Granulocytes: 2 %
Lymphocytes Relative: 9 %
Lymphs Abs: 1.4 10*3/uL (ref 0.7–4.0)
MCH: 28.2 pg (ref 26.0–34.0)
MCHC: 34.2 g/dL (ref 30.0–36.0)
MCV: 82.3 fL (ref 80.0–100.0)
Monocytes Absolute: 0.8 10*3/uL (ref 0.1–1.0)
Monocytes Relative: 5 %
Neutro Abs: 12.7 10*3/uL — ABNORMAL HIGH (ref 1.7–7.7)
Neutrophils Relative %: 84 %
Platelets: 445 10*3/uL — ABNORMAL HIGH (ref 150–400)
RBC: 4.58 MIL/uL (ref 3.87–5.11)
RDW: 13.7 % (ref 11.5–15.5)
Smear Review: NORMAL
WBC: 15.1 10*3/uL — ABNORMAL HIGH (ref 4.0–10.5)
nRBC: 0 % (ref 0.0–0.2)

## 2020-10-10 LAB — GLUCOSE, CAPILLARY
Glucose-Capillary: 279 mg/dL — ABNORMAL HIGH (ref 70–99)
Glucose-Capillary: 279 mg/dL — ABNORMAL HIGH (ref 70–99)
Glucose-Capillary: 238 mg/dL — ABNORMAL HIGH (ref 70–99)
Glucose-Capillary: 247 mg/dL — ABNORMAL HIGH (ref 70–99)

## 2020-10-10 LAB — C-REACTIVE PROTEIN: CRP: 1.7 mg/dL — ABNORMAL HIGH (ref <1.0)

## 2020-10-10 LAB — FIBRIN DERIVATIVES D-DIMER (ARMC ONLY): Fibrin derivatives D-dimer (ARMC): 1407.73 ng/mL (FEU) — ABNORMAL HIGH (ref 0.00–499.00)

## 2020-10-10 MED ORDER — INSULIN DETEMIR 100 UNIT/ML ~~LOC~~ SOLN
25.0000 [IU] | Freq: Two times a day (BID) | SUBCUTANEOUS | Status: DC
  Administered 2020-10-10 (×2): 25 [IU] via SUBCUTANEOUS
  Filled 2020-10-10 (×4): qty 0.25

## 2020-10-10 MED ORDER — BARICITINIB 2 MG PO TABS
4.0000 mg | ORAL_TABLET | Freq: Every day | ORAL | Status: DC
  Administered 2020-10-10 – 2020-10-14 (×5): 4 mg via ORAL
  Filled 2020-10-10 (×5): qty 2

## 2020-10-10 NOTE — Progress Notes (Signed)
Progress Note    Shari Lee  [id number] DOB: 02/28/1956  DOA: 10/06/2020 PCP: Mar Daring, PA-C      Brief Narrative:    Medical records reviewed and are as summarized below:  Shari Lee is a 64 y.o. female with past medical history significant for type 2 diabetes mellitus, essential hypertension, obesity who presented to the emergency department with 1 week history of nausea, generalized malaise, cough and shortness of breath.      Assessment/Plan:   Principal Problem:   Acute respiratory disease due to COVID-19 virus Active Problems:   Diabetes mellitus type 2, uncontrolled ([place])   Essential (primary) hypertension    Body mass index is 36.58 kg/m.  (Morbid obesity)   Acute hypoxic respiratory failure secondary to acute Covid-19 viral pneumonia --COVID test: + PCR 10/05/2020 --prone for 2-3hrs every 12hrs if able --She is still requiring 6 L/min oxygen via HFNC.  Oxygen saturation is fluctuating between 89% and 98%. --Continue prednisone --Discussed treatment with baricitinib because of persistent hypoxia.  Risks and benefits of baricitinib were discussed.  Patient is agreeable to proceed with treatment. --Continue airborne/contact isolation precautions for 3 weeks from the day of diagnosis   Type 2 diabetes mellitus, poorly controlled Home regimen includes empagliflozin-metformin 5-500mg  BID.  Hemoglobin A1c 13.5 on 10/01/2020. --Hold oral hypoglycemics while inpatient --Worsening hyperglycemia is likely due to steroids. --Increase Levemir to 25 units twice daily (from 20 units) --Tradjenta 5 mg p.o. daily --NovoLog as needed for hyperglycemia.  Essential hypertension --Continue antihypertensives and monitor BP closely.    Plan of care was discussed with her daughter, Alyse Low, over the phone.   Diet Order            Diet heart healthy/carb modified Room service appropriate? Yes; Fluid consistency: Thin  Diet effective now                     Consultants:  None  Procedures:  None    Medications:   . amLODipine  5 mg Oral Daily   And  . benazepril  20 mg Oral Daily  . vitamin C  500 mg Oral Daily  . atenolol  50 mg Oral Daily  . baricitinib  4 mg Oral Daily  . enoxaparin (LOVENOX) injection  0.5 mg/kg Subcutaneous Q24H  . insulin aspart  0-20 Units Subcutaneous TID WC  . insulin aspart  0-5 Units Subcutaneous QHS  . insulin detemir  25 Units Subcutaneous BID  . linagliptin  5 mg Oral Daily  . predniSONE  50 mg Oral Daily  . sodium chloride flush  3 mL Intravenous Q12H  . zinc sulfate  220 mg Oral Daily   Continuous Infusions: . sodium chloride       Anti-infectives (From admission, onward)   Start     Dose/Rate Route Frequency Ordered Stop   10/07/20 1000  remdesivir 100 mg in sodium chloride 0.9 % 100 mL IVPB       "Followed by" Linked Group Details   100 mg 200 mL/hr over 30 Minutes Intravenous Daily 10/06/20 2217 10/10/20 0930   10/06/20 2300  remdesivir 200 mg in sodium chloride 0.9% 250 mL IVPB       "Followed by" Linked Group Details   200 mg 580 mL/hr over 30 Minutes Intravenous Once 10/06/20 2217 10/07/20 0129             Family Communication/Anticipated D/C date and plan/Code Status   DVT prophylaxis:  Code Status: Full Code  Family Communication: Plan discussed with Alyse Low, her daughter. Disposition Plan:    Status is: Inpatient  Remains inpatient appropriate because:severe hypoxia   Dispo: The patient is from: Home              Anticipated d/c is to: Home              Anticipated d/c date is: 3 days              Patient currently is not medically stable to d/c.           Subjective:   Interval events noted. No new complaints. She feels short of breath with little activity.  Objective:    Vitals:   10/09/20 1939 10/10/20 0354 10/10/20 0811 10/10/20 1142  BP: 136/70 (!) 145/70 (!) 145/67 126/60  Pulse: 62 (!) 57 62 64   Resp: 16 16 19    Temp: (!) 97.4 F (36.3 C) 97.7 F (36.5 C) 98.3 F (36.8 C)   TempSrc:   Oral   SpO2: 94% 95% 96% 98%  Weight:      Height:       No data found.  No intake or output data in the 24 hours ending 10/10/20 1156 Filed Weights   10/06/20 2123  Weight: 90.7 kg    Exam:   GEN: NAD SKIN: No rash EYES: EOMI ENT: MMM CV: RRR PULM: Bibasilar rales, no wheezing ABD: soft, obese, NT, +BS CNS: AAO x 3, non focal EXT: No edema or tenderness      Data Reviewed:   I have personally reviewed following labs and imaging studies:  Labs: Labs show the following:   Basic Metabolic Panel: Recent Labs  Lab 10/05/20 1825 10/06/20 2203 10/08/20 0406 10/09/20 0423 10/10/20 0511  NA 132* 130* 130* 135 136  K 3.3* 4.1 4.0 4.4 4.0  CL 102 97* 98 101 101  CO2 18* 22 21* 26 25  GLUCOSE 162* 239* 326* 257* 258*  BUN 10 10 28* 25* 24*  CREATININE 0.60 0.80 0.71 0.63 0.65  CALCIUM 7.1* 8.3* 8.3* 8.5* 8.2*  MG  --   --  2.3  --   --   PHOS  --   --  4.0  --   --    GFR Estimated Creatinine Clearance: 75.3 mL/min (by C-G formula based on SCr of 0.65 mg/dL). Liver Function Tests: Recent Labs  Lab 10/08/20 0406 10/09/20 0423 10/10/20 0511  AST 34 36 47*  ALT 22 25 35  ALKPHOS 59 59 59  BILITOT 0.6 0.5 0.9  PROT 6.4* 6.3* 5.9*  ALBUMIN 2.7* 2.7* 2.6*   No results for input(s): LIPASE, AMYLASE in the last 168 hours. No results for input(s): AMMONIA in the last 168 hours. Coagulation profile No results for input(s): INR, PROTIME in the last 168 hours.  CBC: Recent Labs  Lab 10/05/20 1342 10/06/20 2203 10/08/20 0406 10/09/20 0423 10/10/20 0511  WBC 7.6 9.0 11.3* 15.3* 15.1*  NEUTROABS  --  7.4 9.3* 13.0* 12.7*  HGB 13.3 12.4 12.8 13.1 12.9  HCT 39.4 36.4 37.3 38.6 37.7  MCV 82.8 82.7 82.9 83.0 82.3  PLT 263 278 347 423* 445*   Cardiac Enzymes: No results for input(s): CKTOTAL, CKMB, CKMBINDEX, TROPONINI in the last 168 hours. BNP (last 3  results) No results for input(s): PROBNP in the last 8760 hours. CBG: Recent Labs  Lab 10/09/20 1202 10/09/20 1707 10/09/20 2122 10/10/20 0811 10/10/20 1148  GLUCAP 313* 236*  204* 279* 279*   D-Dimer: No results for input(s): DDIMER in the last 72 hours. Hgb A1c: No results for input(s): HGBA1C in the last 72 hours. Lipid Profile: No results for input(s): CHOL, HDL, LDLCALC, TRIG, CHOLHDL, LDLDIRECT in the last 72 hours. Thyroid function studies: No results for input(s): TSH, T4TOTAL, T3FREE, THYROIDAB in the last 72 hours.  Invalid input(s): FREET3 Anemia work up: No results for input(s): VITAMINB12, FOLATE, FERRITIN, TIBC, IRON, RETICCTPCT in the last 72 hours. Sepsis Labs: Recent Labs  Lab 10/06/20 2203 10/07/20 0422 10/08/20 0406 10/09/20 0423 10/10/20 0511  PROCALCITON  --  <0.10  --   --   --   WBC 9.0  --  11.3* 15.3* 15.1*    Microbiology Recent Results (from the past 240 hour(s))  Resp Panel by RT-PCR (Flu A&B, Covid) Nasopharyngeal Swab     Status: Abnormal   Collection Time: 10/05/20  1:41 PM   Specimen: Nasopharyngeal Swab; Nasopharyngeal(NP) swabs in vial transport medium  Result Value Ref Range Status   SARS Coronavirus 2 by RT PCR POSITIVE (A) NEGATIVE Final    Comment: RESULT CALLED TO, READ BACK BY AND VERIFIED WITH: TAMMY JUDE 10/05/20 AT 1434 BY ACR (NOTE) SARS-CoV-2 target nucleic acids are DETECTED.  The SARS-CoV-2 RNA is generally detectable in upper respiratory specimens during the acute phase of infection. Positive results are indicative of the presence of the identified virus, but do not rule out bacterial infection or co-infection with other pathogens not detected by the test. Clinical correlation with patient history and other diagnostic information is necessary to determine patient infection status. The expected result is Negative.  Fact Sheet for Patients: EntrepreneurPulse.com.au  Fact Sheet for Healthcare  Providers: IncredibleEmployment.be  This test is not yet approved or cleared by the [place] FDA and  has been authorized for detection and/or diagnosis of SARS-CoV-2 by FDA under an Emergency Use Authorization (EUA).  This EUA will remain in effect (meaning this test can b e used) for the duration of  the COVID-19 declaration under Section 564(b)(1) of the Act, 21 U.S.C. section 360bbb-3(b)(1), unless the authorization is terminated or revoked sooner.     Influenza A by PCR NEGATIVE NEGATIVE Final   Influenza B by PCR NEGATIVE NEGATIVE Final    Comment: (NOTE) The Xpert Xpress SARS-CoV-2/FLU/RSV plus assay is intended as an aid in the diagnosis of influenza from Nasopharyngeal swab specimens and should not be used as a sole basis for treatment. Nasal washings and aspirates are unacceptable for Xpert Xpress SARS-CoV-2/FLU/RSV testing.  Fact Sheet for Patients: EntrepreneurPulse.com.au  Fact Sheet for Healthcare Providers: IncredibleEmployment.be  This test is not yet approved or cleared by the [place] FDA and has been authorized for detection and/or diagnosis of SARS-CoV-2 by FDA under an Emergency Use Authorization (EUA). This EUA will remain in effect (meaning this test can be used) for the duration of the COVID-19 declaration under Section 564(b)(1) of the Act, 21 U.S.C. section 360bbb-3(b)(1), unless the authorization is terminated or revoked.  Performed at [facility], [address] [place], [place] [address]     Procedures and diagnostic studies:  No results found.             LOS: 4 days   Trammell Bowden  [facility]   Pager on [email or web address]. If 7PM-7AM, please contact night-coverage at [email or web address]     10/10/2020, 11:56 AM

## 2020-10-11 LAB — COMPREHENSIVE METABOLIC PANEL
ALT: 44 U/L (ref 0–44)
AST: 46 U/L — ABNORMAL HIGH (ref 15–41)
Albumin: 2.4 g/dL — ABNORMAL LOW (ref 3.5–5.0)
Alkaline Phosphatase: 51 U/L (ref 38–126)
Anion gap: 8 (ref 5–15)
BUN: 21 mg/dL (ref 8–23)
CO2: 27 mmol/L (ref 22–32)
Calcium: 7.8 mg/dL — ABNORMAL LOW (ref 8.9–10.3)
Chloride: 100 mmol/L (ref 98–111)
Creatinine, Ser: 0.66 mg/dL (ref 0.44–1.00)
GFR, Estimated: >60 mL/min (ref >60)
Glucose, Bld: 169 mg/dL — ABNORMAL HIGH (ref 70–99)
Potassium: 3.5 mmol/L (ref 3.5–5.1)
Sodium: 135 mmol/L (ref 135–145)
Total Bilirubin: 0.7 mg/dL (ref 0.3–1.2)
Total Protein: 5.2 g/dL — ABNORMAL LOW (ref 6.5–8.1)

## 2020-10-11 LAB — CBC WITH DIFFERENTIAL/PLATELET
Abs Immature Granulocytes: 0.27 10*3/uL — ABNORMAL HIGH (ref 0.00–0.07)
Basophils Absolute: 0 10*3/uL (ref 0.0–0.1)
Basophils Relative: 0 %
Eosinophils Absolute: 0 10*3/uL (ref 0.0–0.5)
Eosinophils Relative: 0 %
HCT: 35.8 % — ABNORMAL LOW (ref 36.0–46.0)
Hemoglobin: 12 g/dL (ref 12.0–15.0)
Immature Granulocytes: 2 %
Lymphocytes Relative: 15 %
Lymphs Abs: 2.4 10*3/uL (ref 0.7–4.0)
MCH: 27.6 pg (ref 26.0–34.0)
MCHC: 33.5 g/dL (ref 30.0–36.0)
MCV: 82.5 fL (ref 80.0–100.0)
Monocytes Absolute: 1.4 10*3/uL — ABNORMAL HIGH (ref 0.1–1.0)
Monocytes Relative: 9 %
Neutro Abs: 11.8 10*3/uL — ABNORMAL HIGH (ref 1.7–7.7)
Neutrophils Relative %: 74 %
Platelets: 410 10*3/uL — ABNORMAL HIGH (ref 150–400)
RBC: 4.34 MIL/uL (ref 3.87–5.11)
RDW: 13.2 % (ref 11.5–15.5)
Smear Review: NORMAL
WBC: 15.9 10*3/uL — ABNORMAL HIGH (ref 4.0–10.5)
nRBC: 0 % (ref 0.0–0.2)

## 2020-10-11 LAB — C-REACTIVE PROTEIN: CRP: 1 mg/dL — ABNORMAL HIGH (ref <1.0)

## 2020-10-11 LAB — GLUCOSE, CAPILLARY
Glucose-Capillary: 119 mg/dL — ABNORMAL HIGH (ref 70–99)
Glucose-Capillary: 102 mg/dL — ABNORMAL HIGH (ref 70–99)
Glucose-Capillary: 199 mg/dL — ABNORMAL HIGH (ref 70–99)
Glucose-Capillary: 199 mg/dL — ABNORMAL HIGH (ref 70–99)

## 2020-10-11 LAB — FIBRIN DERIVATIVES D-DIMER (ARMC ONLY): Fibrin derivatives D-dimer (ARMC): 1166.67 ng/mL (FEU) — ABNORMAL HIGH (ref 0.00–499.00)

## 2020-10-11 MED ORDER — INSULIN DETEMIR 100 UNIT/ML ~~LOC~~ SOLN
20.0000 [IU] | Freq: Two times a day (BID) | SUBCUTANEOUS | Status: DC
  Administered 2020-10-11 – 2020-10-12 (×3): 20 [IU] via SUBCUTANEOUS
  Filled 2020-10-11 (×5): qty 0.2

## 2020-10-11 NOTE — Progress Notes (Addendum)
Progress Note    Karlena Luebke  OXB:[id number] DOB: 05/17/1956  DOA: 10/06/2020 PCP: Mar Daring, PA-C      Brief Narrative:    Medical records reviewed and are as summarized below:  Shari Lee is a 64 y.o. female with past medical history significant for type 2 diabetes mellitus, essential hypertension, obesity who presented to the emergency department with 1 week history of nausea, generalized malaise, cough and shortness of breath.      Assessment/Plan:   Principal Problem:   Acute respiratory disease due to COVID-19 virus Active Problems:   Diabetes mellitus type 2, uncontrolled ([place])   Essential (primary) hypertension    Body mass index is 36.58 kg/m.  (Morbid obesity)   Acute hypoxic respiratory failure secondary to acute Covid-19 viral pneumonia --COVID test: + PCR 10/05/2020 --prone for 2-3hrs every 12hrs if able --She is down to 5 L/min oxygen via high flow nasal cannula.  Taper down oxygen as able. --Completed IV remdesivir on 10/10/2020 --Continue prednisone and baricitinib. --Continue airborne/contact isolation precautions for 3 weeks from the day of diagnosis   Type 2 diabetes mellitus, poorly controlled Home regimen includes empagliflozin-metformin 5-500mg  BID.  Hemoglobin A1c 13.5 on 10/01/2020. --Hold oral hypoglycemics while inpatient --Decrease Levemir from 25 units to 20 units twice daily --Tradjenta 5 mg p.o. daily --NovoLog as needed for hyperglycemia.  Essential hypertension --Continue antihypertensives and monitor BP closely.     Diet Order            Diet heart healthy/carb modified Room service appropriate? Yes; Fluid consistency: Thin  Diet effective now                    Consultants:  None  Procedures:  None    Medications:   . amLODipine  5 mg Oral Daily   And  . benazepril  20 mg Oral Daily  . vitamin C  500 mg Oral Daily  . atenolol  50 mg Oral Daily  . baricitinib  4 mg Oral  Daily  . enoxaparin (LOVENOX) injection  0.5 mg/kg Subcutaneous Q24H  . insulin aspart  0-20 Units Subcutaneous TID WC  . insulin aspart  0-5 Units Subcutaneous QHS  . insulin detemir  20 Units Subcutaneous BID  . linagliptin  5 mg Oral Daily  . predniSONE  50 mg Oral Daily  . sodium chloride flush  3 mL Intravenous Q12H  . zinc sulfate  220 mg Oral Daily   Continuous Infusions: . sodium chloride       Anti-infectives (From admission, onward)   Start     Dose/Rate Route Frequency Ordered Stop   10/07/20 1000  remdesivir 100 mg in sodium chloride 0.9 % 100 mL IVPB       "Followed by" Linked Group Details   100 mg 200 mL/hr over 30 Minutes Intravenous Daily 10/06/20 2217 10/10/20 0930   10/06/20 2300  remdesivir 200 mg in sodium chloride 0.9% 250 mL IVPB       "Followed by" Linked Group Details   200 mg 580 mL/hr over 30 Minutes Intravenous Once 10/06/20 2217 10/07/20 0129             Family Communication/Anticipated D/C date and plan/Code Status   DVT prophylaxis:      Code Status: Full Code  Family Communication: None Disposition Plan:    Status is: Inpatient  Remains inpatient appropriate because:severe hypoxia   Dispo: The patient is from: Home  Anticipated d/c is to: Home              Anticipated d/c date is: 3 days              Patient currently is not medically stable to d/c.           Subjective:   Interval events noted.  No new complaints.  She feels short of breath with exertion.  Objective:    Vitals:   10/11/20 0512 10/11/20 0701 10/11/20 0854 10/11/20 0938  BP: 137/70  137/65   Pulse: (!) 58  (!) 59 71  Resp: 15  16   Temp: 98.3 F (36.8 C)  98.5 F (36.9 C)   TempSrc: Oral     SpO2: 99% 98% 97%   Weight:      Height:       No data found.  No intake or output data in the 24 hours ending 10/11/20 1031 Filed Weights   10/06/20 2123  Weight: 90.7 kg    Exam:   GEN: NAD SKIN: Warm and dry EYES: No  pallor or icterus ENT: MMM CV: RRR PULM: Bibasilar rales.  No wheezing ABD: soft, ND, NT, +BS CNS: AAO x 3, non focal EXT: No edema or tenderness      Data Reviewed:   I have personally reviewed following labs and imaging studies:  Labs: Labs show the following:   Basic Metabolic Panel: Recent Labs  Lab 10/06/20 2203 10/08/20 0406 10/09/20 0423 10/10/20 0511 10/11/20 0432  NA 130* 130* 135 136 135  K 4.1 4.0 4.4 4.0 3.5  CL 97* 98 101 101 100  CO2 22 21* 26 25 27   GLUCOSE 239* 326* 257* 258* 169*  BUN 10 28* 25* 24* 21  CREATININE 0.80 0.71 0.63 0.65 0.66  CALCIUM 8.3* 8.3* 8.5* 8.2* 7.8*  MG  --  2.3  --   --   --   PHOS  --  4.0  --   --   --    GFR Estimated Creatinine Clearance: 75.3 mL/min (by C-G formula based on SCr of 0.66 mg/dL). Liver Function Tests: Recent Labs  Lab 10/08/20 0406 10/09/20 0423 10/10/20 0511 10/11/20 0432  AST 34 36 47* 46*  ALT 22 25 35 44  ALKPHOS 59 59 59 51  BILITOT 0.6 0.5 0.9 0.7  PROT 6.4* 6.3* 5.9* 5.2*  ALBUMIN 2.7* 2.7* 2.6* 2.4*   No results for input(s): LIPASE, AMYLASE in the last 168 hours. No results for input(s): AMMONIA in the last 168 hours. Coagulation profile No results for input(s): INR, PROTIME in the last 168 hours.  CBC: Recent Labs  Lab 10/06/20 2203 10/08/20 0406 10/09/20 0423 10/10/20 0511 10/11/20 0432  WBC 9.0 11.3* 15.3* 15.1* 15.9*  NEUTROABS 7.4 9.3* 13.0* 12.7* 11.8*  HGB 12.4 12.8 13.1 12.9 12.0  HCT 36.4 37.3 38.6 37.7 35.8*  MCV 82.7 82.9 83.0 82.3 82.5  PLT 278 347 423* 445* 410*   Cardiac Enzymes: No results for input(s): CKTOTAL, CKMB, CKMBINDEX, TROPONINI in the last 168 hours. BNP (last 3 results) No results for input(s): PROBNP in the last 8760 hours. CBG: Recent Labs  Lab 10/10/20 0811 10/10/20 1148 10/10/20 1546 10/10/20 2000 10/11/20 0853  GLUCAP 279* 279* 238* 247* 119*   D-Dimer: No results for input(s): DDIMER in the last 72 hours. Hgb A1c: No results  for input(s): HGBA1C in the last 72 hours. Lipid Profile: No results for input(s): CHOL, HDL, LDLCALC, TRIG, CHOLHDL, LDLDIRECT in  the last 72 hours. Thyroid function studies: No results for input(s): TSH, T4TOTAL, T3FREE, THYROIDAB in the last 72 hours.  Invalid input(s): FREET3 Anemia work up: No results for input(s): VITAMINB12, FOLATE, FERRITIN, TIBC, IRON, RETICCTPCT in the last 72 hours. Sepsis Labs: Recent Labs  Lab 10/07/20 0422 10/08/20 0406 10/09/20 0423 10/10/20 0511 10/11/20 0432  PROCALCITON <0.10  --   --   --   --   WBC  --  11.3* 15.3* 15.1* 15.9*    Microbiology Recent Results (from the past 240 hour(s))  Resp Panel by RT-PCR (Flu A&B, Covid) Nasopharyngeal Swab     Status: Abnormal   Collection Time: 10/05/20  1:41 PM   Specimen: Nasopharyngeal Swab; Nasopharyngeal(NP) swabs in vial transport medium  Result Value Ref Range Status   SARS Coronavirus 2 by RT PCR POSITIVE (A) NEGATIVE Final    Comment: RESULT CALLED TO, READ BACK BY AND VERIFIED WITH: TAMMY JUDE 10/05/20 AT 1434 BY ACR (NOTE) SARS-CoV-2 target nucleic acids are DETECTED.  The SARS-CoV-2 RNA is generally detectable in upper respiratory specimens during the acute phase of infection. Positive results are indicative of the presence of the identified virus, but do not rule out bacterial infection or co-infection with other pathogens not detected by the test. Clinical correlation with patient history and other diagnostic information is necessary to determine patient infection status. The expected result is Negative.  Fact Sheet for Patients: EntrepreneurPulse.com.au  Fact Sheet for Healthcare Providers: IncredibleEmployment.be  This test is not yet approved or cleared by the [place] FDA and  has been authorized for detection and/or diagnosis of SARS-CoV-2 by FDA under an Emergency Use Authorization (EUA).  This EUA will remain in effect (meaning this  test can b e used) for the duration of  the COVID-19 declaration under Section 564(b)(1) of the Act, 21 U.S.C. section 360bbb-3(b)(1), unless the authorization is terminated or revoked sooner.     Influenza A by PCR NEGATIVE NEGATIVE Final   Influenza B by PCR NEGATIVE NEGATIVE Final    Comment: (NOTE) The Xpert Xpress SARS-CoV-2/FLU/RSV plus assay is intended as an aid in the diagnosis of influenza from Nasopharyngeal swab specimens and should not be used as a sole basis for treatment. Nasal washings and aspirates are unacceptable for Xpert Xpress SARS-CoV-2/FLU/RSV testing.  Fact Sheet for Patients: EntrepreneurPulse.com.au  Fact Sheet for Healthcare Providers: IncredibleEmployment.be  This test is not yet approved or cleared by the [place] FDA and has been authorized for detection and/or diagnosis of SARS-CoV-2 by FDA under an Emergency Use Authorization (EUA). This EUA will remain in effect (meaning this test can be used) for the duration of the COVID-19 declaration under Section 564(b)(1) of the Act, 21 U.S.C. section 360bbb-3(b)(1), unless the authorization is terminated or revoked.  Performed at [facility], [address] [place], [place] [address]     Procedures and diagnostic studies:  No results found.             LOS: 5 days   Lillyian Heidt  [facility]   Pager on [email or web address]. If 7PM-7AM, please contact night-coverage at [email or web address]     10/11/2020, 10:31 AM

## 2020-10-12 LAB — CBC WITH DIFFERENTIAL/PLATELET
Abs Immature Granulocytes: 0.26 10*3/uL — ABNORMAL HIGH (ref 0.00–0.07)
Basophils Absolute: 0 10*3/uL (ref 0.0–0.1)
Basophils Relative: 0 %
Eosinophils Absolute: 0 10*3/uL (ref 0.0–0.5)
Eosinophils Relative: 0 %
HCT: 35.4 % — ABNORMAL LOW (ref 36.0–46.0)
Hemoglobin: 12.3 g/dL (ref 12.0–15.0)
Immature Granulocytes: 1 %
Lymphocytes Relative: 14 %
Lymphs Abs: 2.6 10*3/uL (ref 0.7–4.0)
MCH: 28.5 pg (ref 26.0–34.0)
MCHC: 34.7 g/dL (ref 30.0–36.0)
MCV: 82.1 fL (ref 80.0–100.0)
Monocytes Absolute: 1.6 10*3/uL — ABNORMAL HIGH (ref 0.1–1.0)
Monocytes Relative: 9 %
Neutro Abs: 13.8 10*3/uL — ABNORMAL HIGH (ref 1.7–7.7)
Neutrophils Relative %: 76 %
Platelets: 417 10*3/uL — ABNORMAL HIGH (ref 150–400)
RBC: 4.31 MIL/uL (ref 3.87–5.11)
RDW: 13.3 % (ref 11.5–15.5)
WBC: 18.2 10*3/uL — ABNORMAL HIGH (ref 4.0–10.5)
nRBC: 0 % (ref 0.0–0.2)

## 2020-10-12 LAB — COMPREHENSIVE METABOLIC PANEL
ALT: 44 U/L (ref 0–44)
AST: 38 U/L (ref 15–41)
Albumin: 2.4 g/dL — ABNORMAL LOW (ref 3.5–5.0)
Alkaline Phosphatase: 43 U/L (ref 38–126)
Anion gap: 8 (ref 5–15)
BUN: 17 mg/dL (ref 8–23)
CO2: 28 mmol/L (ref 22–32)
Calcium: 7.7 mg/dL — ABNORMAL LOW (ref 8.9–10.3)
Chloride: 102 mmol/L (ref 98–111)
Creatinine, Ser: 0.54 mg/dL (ref 0.44–1.00)
GFR, Estimated: >60 mL/min (ref >60)
Glucose, Bld: 92 mg/dL (ref 70–99)
Potassium: 3.2 mmol/L — ABNORMAL LOW (ref 3.5–5.1)
Sodium: 138 mmol/L (ref 135–145)
Total Bilirubin: 0.7 mg/dL (ref 0.3–1.2)
Total Protein: 5.3 g/dL — ABNORMAL LOW (ref 6.5–8.1)

## 2020-10-12 LAB — GLUCOSE, CAPILLARY
Glucose-Capillary: 93 mg/dL (ref 70–99)
Glucose-Capillary: 126 mg/dL — ABNORMAL HIGH (ref 70–99)
Glucose-Capillary: 251 mg/dL — ABNORMAL HIGH (ref 70–99)
Glucose-Capillary: 231 mg/dL — ABNORMAL HIGH (ref 70–99)

## 2020-10-12 LAB — C-REACTIVE PROTEIN: CRP: 0.9 mg/dL (ref <1.0)

## 2020-10-12 LAB — MAGNESIUM: Magnesium: 2.5 mg/dL — ABNORMAL HIGH (ref 1.7–2.4)

## 2020-10-12 LAB — FIBRIN DERIVATIVES D-DIMER (ARMC ONLY): Fibrin derivatives D-dimer (ARMC): 893.89 ng/mL (FEU) — ABNORMAL HIGH (ref 0.00–499.00)

## 2020-10-12 MED ORDER — POTASSIUM CHLORIDE CRYS ER 20 MEQ PO TBCR
40.0000 meq | EXTENDED_RELEASE_TABLET | Freq: Once | ORAL | Status: AC
  Administered 2020-10-12: 40 meq via ORAL
  Filled 2020-10-12: qty 2

## 2020-10-12 MED ORDER — INSULIN DETEMIR 100 UNIT/ML ~~LOC~~ SOLN
10.0000 [IU] | Freq: Two times a day (BID) | SUBCUTANEOUS | Status: DC
  Administered 2020-10-12 – 2020-10-14 (×4): 10 [IU] via SUBCUTANEOUS
  Filled 2020-10-12 (×5): qty 0.1

## 2020-10-12 NOTE — Progress Notes (Addendum)
Progress Note    Shari Lee  HYI:[id number] DOB: Feb 21, 1956  DOA: 10/06/2020 PCP: Mar Daring, PA-C      Brief Narrative:    Medical records reviewed and are as summarized below:  Shari Lee is a 64 y.o. female with past medical history significant for type 2 diabetes mellitus, essential hypertension, obesity who presented to the emergency department with 1 week history of nausea, generalized malaise, cough and shortness of breath.  She was admitted to the hospital [id number] pneumonia complicated by acute hypoxic respiratory failure.  She was treated with steroids, IV remdesivir and baricitinib.  She also required up to 9 L/min oxygen via high flow nasal cannula.    Assessment/Plan:   Principal Problem:   Acute respiratory disease due to COVID-19 virus Active Problems:   Diabetes mellitus type 2, uncontrolled ([place])   Essential (primary) hypertension    Body mass index is 36.58 kg/m.  (Morbid obesity)   Acute hypoxic respiratory failure secondary to acute Covid-19 viral pneumonia --COVID test: + PCR 10/05/2020 --She is down to 3.5 L/min oxygen via nasal cannula.  Continue to taper down oxygen as able.  --Completed IV remdesivir on 10/10/2020 --Continue prednisone and baricitinib. --Continue airborne/contact isolation precautions for 3 weeks from the day of diagnosis   Type 2 diabetes mellitus, poorly controlled Home regimen includes empagliflozin-metformin 5-500mg  BID.  Hemoglobin A1c 13.5 on 10/01/2020. --Hold oral hypoglycemics while inpatient --Glucose levels are improving.  Decrease Levemir from 20units to 10 units twice daily. --Tradjenta 5 mg p.o. daily --NovoLog as needed for hyperglycemia.   Hypokalemia Replete potassium and monitor levels.  Check magnesium level and replete as needed.  Essential hypertension --Continue antihypertensives and monitor BP closely.  Generalized weakness --Consult PT.   Diet Order             Diet heart healthy/carb modified Room service appropriate? Yes; Fluid consistency: Thin  Diet effective now                    Consultants:  None  Procedures:  None    Medications:   . amLODipine  5 mg Oral Daily   And  . benazepril  20 mg Oral Daily  . vitamin C  500 mg Oral Daily  . atenolol  50 mg Oral Daily  . baricitinib  4 mg Oral Daily  . enoxaparin (LOVENOX) injection  0.5 mg/kg Subcutaneous Q24H  . insulin aspart  0-20 Units Subcutaneous TID WC  . insulin aspart  0-5 Units Subcutaneous QHS  . insulin detemir  20 Units Subcutaneous BID  . linagliptin  5 mg Oral Daily  . predniSONE  50 mg Oral Daily  . sodium chloride flush  3 mL Intravenous Q12H  . zinc sulfate  220 mg Oral Daily   Continuous Infusions: . sodium chloride       Anti-infectives (From admission, onward)   Start     Dose/Rate Route Frequency Ordered Stop   10/07/20 1000  remdesivir 100 mg in sodium chloride 0.9 % 100 mL IVPB       "Followed by" Linked Group Details   100 mg 200 mL/hr over 30 Minutes Intravenous Daily 10/06/20 2217 10/10/20 0930   10/06/20 2300  remdesivir 200 mg in sodium chloride 0.9% 250 mL IVPB       "Followed by" Linked Group Details   200 mg 580 mL/hr over 30 Minutes Intravenous Once 10/06/20 2217 10/07/20 0129  Family Communication/Anticipated D/C date and plan/Code Status   DVT prophylaxis:      Code Status: Full Code  Family Communication: None Disposition Plan:    Status is: Inpatient  Remains inpatient appropriate because:severe hypoxia   Dispo: The patient is from: Home              Anticipated d/c is to: Home              Anticipated d/c date is: 3 days              Patient currently is not medically stable to d/c.           Subjective:   Interval events noted.  She complains of generalized weakness.  She is short of breath with little activity.  Objective:    Vitals:   10/11/20 1948 10/12/20 0458 10/12/20  0701 10/12/20 0834  BP: 133/70 (!) 135/52  138/77  Pulse: (!) 59 60  66  Resp: 15 16  16   Temp: 98 F (36.7 C) (!) 97.4 F (36.3 C)  97.8 F (36.6 C)  TempSrc: Oral Oral    SpO2: 97% 94% 94% 96%  Weight:      Height:       No data found.  No intake or output data in the 24 hours ending 10/12/20 1053 Filed Weights   10/06/20 2123  Weight: 90.7 kg    Exam:   GEN: NAD SKIN: No rash EYES: No pallor or icterus ENT: MMM CV: RRR PULM: Bibasilar rales.  Air entry adequate bilaterally.  No wheezing heard. ABD: soft, obese, NT, +BS CNS: AAO x 3, non focal EXT: No edema or tenderness      Data Reviewed:   I have personally reviewed following labs and imaging studies:  Labs: Labs show the following:   Basic Metabolic Panel: Recent Labs  Lab 10/08/20 0406 10/09/20 0423 10/10/20 0511 10/11/20 0432 10/12/20 0427  NA 130* 135 136 135 138  K 4.0 4.4 4.0 3.5 3.2*  CL 98 101 101 100 102  CO2 21* 26 25 27 28   GLUCOSE 326* 257* 258* 169* 92  BUN 28* 25* 24* 21 17  CREATININE 0.71 0.63 0.65 0.66 0.54  CALCIUM 8.3* 8.5* 8.2* 7.8* 7.7*  MG 2.3  --   --   --   --   PHOS 4.0  --   --   --   --    GFR Estimated Creatinine Clearance: 75.3 mL/min (by C-G formula based on SCr of 0.54 mg/dL). Liver Function Tests: Recent Labs  Lab 10/08/20 0406 10/09/20 0423 10/10/20 0511 10/11/20 0432 10/12/20 0427  AST 34 36 47* 46* 38  ALT 22 25 35 44 44  ALKPHOS 59 59 59 51 43  BILITOT 0.6 0.5 0.9 0.7 0.7  PROT 6.4* 6.3* 5.9* 5.2* 5.3*  ALBUMIN 2.7* 2.7* 2.6* 2.4* 2.4*   No results for input(s): LIPASE, AMYLASE in the last 168 hours. No results for input(s): AMMONIA in the last 168 hours. Coagulation profile No results for input(s): INR, PROTIME in the last 168 hours.  CBC: Recent Labs  Lab 10/08/20 0406 10/09/20 0423 10/10/20 0511 10/11/20 0432 10/12/20 0427  WBC 11.3* 15.3* 15.1* 15.9* 18.2*  NEUTROABS 9.3* 13.0* 12.7* 11.8* 13.8*  HGB 12.8 13.1 12.9 12.0 12.3   HCT 37.3 38.6 37.7 35.8* 35.4*  MCV 82.9 83.0 82.3 82.5 82.1  PLT 347 423* 445* 410* 417*   Cardiac Enzymes: No results for input(s): CKTOTAL, CKMB, CKMBINDEX, TROPONINI  in the last 168 hours. BNP (last 3 results) No results for input(s): PROBNP in the last 8760 hours. CBG: Recent Labs  Lab 10/11/20 0853 10/11/20 1155 10/11/20 1656 10/11/20 1949 10/12/20 0836  GLUCAP 119* 102* 199* 199* 93   D-Dimer: No results for input(s): DDIMER in the last 72 hours. Hgb A1c: No results for input(s): HGBA1C in the last 72 hours. Lipid Profile: No results for input(s): CHOL, HDL, LDLCALC, TRIG, CHOLHDL, LDLDIRECT in the last 72 hours. Thyroid function studies: No results for input(s): TSH, T4TOTAL, T3FREE, THYROIDAB in the last 72 hours.  Invalid input(s): FREET3 Anemia work up: No results for input(s): VITAMINB12, FOLATE, FERRITIN, TIBC, IRON, RETICCTPCT in the last 72 hours. Sepsis Labs: Recent Labs  Lab 10/07/20 0422 10/08/20 0406 10/09/20 0423 10/10/20 0511 10/11/20 0432 10/12/20 0427  PROCALCITON <0.10  --   --   --   --   --   WBC  --    < > 15.3* 15.1* 15.9* 18.2*   < > = values in this interval not displayed.    Microbiology Recent Results (from the past 240 hour(s))  Resp Panel by RT-PCR (Flu A&B, Covid) Nasopharyngeal Swab     Status: Abnormal   Collection Time: 10/05/20  1:41 PM   Specimen: Nasopharyngeal Swab; Nasopharyngeal(NP) swabs in vial transport medium  Result Value Ref Range Status   SARS Coronavirus 2 by RT PCR POSITIVE (A) NEGATIVE Final    Comment: RESULT CALLED TO, READ BACK BY AND VERIFIED WITH: TAMMY JUDE 10/05/20 AT 1434 BY ACR (NOTE) SARS-CoV-2 target nucleic acids are DETECTED.  The SARS-CoV-2 RNA is generally detectable in upper respiratory specimens during the acute phase of infection. Positive results are indicative of the presence of the identified virus, but do not rule out bacterial infection or co-infection with other pathogens  not detected by the test. Clinical correlation with patient history and other diagnostic information is necessary to determine patient infection status. The expected result is Negative.  Fact Sheet for Patients: BloggerCourse.comhttps://www.fda.gov/media/152166/download  Fact Sheet for Healthcare Providers: SeriousBroker.ithttps://www.fda.gov/media/152162/download  This test is not yet approved or cleared by the [place] FDA and  has been authorized for detection and/or diagnosis of SARS-CoV-2 by FDA under an Emergency Use Authorization (EUA).  This EUA will remain in effect (meaning this test can b e used) for the duration of  the COVID-19 declaration under Section 564(b)(1) of the Act, 21 U.S.C. section 360bbb-3(b)(1), unless the authorization is terminated or revoked sooner.     Influenza A by PCR NEGATIVE NEGATIVE Final   Influenza B by PCR NEGATIVE NEGATIVE Final    Comment: (NOTE) The Xpert Xpress SARS-CoV-2/FLU/RSV plus assay is intended as an aid in the diagnosis of influenza from Nasopharyngeal swab specimens and should not be used as a sole basis for treatment. Nasal washings and aspirates are unacceptable for Xpert Xpress SARS-CoV-2/FLU/RSV testing.  Fact Sheet for Patients: BloggerCourse.comhttps://www.fda.gov/media/152166/download  Fact Sheet for Healthcare Providers: SeriousBroker.ithttps://www.fda.gov/media/152162/download  This test is not yet approved or cleared by the [place] FDA and has been authorized for detection and/or diagnosis of SARS-CoV-2 by FDA under an Emergency Use Authorization (EUA). This EUA will remain in effect (meaning this test can be used) for the duration of the COVID-19 declaration under Section 564(b)(1) of the Act, 21 U.S.C. section 360bbb-3(b)(1), unless the authorization is terminated or revoked.  Performed at [facility], [address] [place], [place] [id number]     Procedures and diagnostic studies:  No results found.  LOS: 6 days    Jaaziel Peatross  [facility]   Pager on [email or web address]. If 7PM-7AM, please contact night-coverage at [email or web address]     10/12/2020, 10:53 AM

## 2020-10-13 LAB — BASIC METABOLIC PANEL
Anion gap: 6 (ref 5–15)
BUN: 16 mg/dL (ref 8–23)
CO2: 29 mmol/L (ref 22–32)
Calcium: 7.9 mg/dL — ABNORMAL LOW (ref 8.9–10.3)
Chloride: 99 mmol/L (ref 98–111)
Creatinine, Ser: 0.52 mg/dL (ref 0.44–1.00)
GFR, Estimated: >60 mL/min (ref >60)
Glucose, Bld: 86 mg/dL (ref 70–99)
Potassium: 3.9 mmol/L (ref 3.5–5.1)
Sodium: 134 mmol/L — ABNORMAL LOW (ref 135–145)

## 2020-10-13 LAB — CBC WITH DIFFERENTIAL/PLATELET
Abs Immature Granulocytes: 0.2 10*3/uL — ABNORMAL HIGH (ref 0.00–0.07)
Basophils Absolute: 0 10*3/uL (ref 0.0–0.1)
Basophils Relative: 0 %
Eosinophils Absolute: 0 10*3/uL (ref 0.0–0.5)
Eosinophils Relative: 0 %
HCT: 36.9 % (ref 36.0–46.0)
Hemoglobin: 12.3 g/dL (ref 12.0–15.0)
Immature Granulocytes: 2 %
Lymphocytes Relative: 17 %
Lymphs Abs: 1.9 10*3/uL (ref 0.7–4.0)
MCH: 27.8 pg (ref 26.0–34.0)
MCHC: 33.3 g/dL (ref 30.0–36.0)
MCV: 83.5 fL (ref 80.0–100.0)
Monocytes Absolute: 1.3 10*3/uL — ABNORMAL HIGH (ref 0.1–1.0)
Monocytes Relative: 12 %
Neutro Abs: 7.9 10*3/uL — ABNORMAL HIGH (ref 1.7–7.7)
Neutrophils Relative %: 69 %
Platelets: 388 10*3/uL (ref 150–400)
RBC: 4.42 MIL/uL (ref 3.87–5.11)
RDW: 13.2 % (ref 11.5–15.5)
WBC: 11.3 10*3/uL — ABNORMAL HIGH (ref 4.0–10.5)
nRBC: 0 % (ref 0.0–0.2)

## 2020-10-13 LAB — GLUCOSE, CAPILLARY
Glucose-Capillary: 91 mg/dL (ref 70–99)
Glucose-Capillary: 184 mg/dL — ABNORMAL HIGH (ref 70–99)
Glucose-Capillary: 295 mg/dL — ABNORMAL HIGH (ref 70–99)
Glucose-Capillary: 287 mg/dL — ABNORMAL HIGH (ref 70–99)

## 2020-10-13 NOTE — Progress Notes (Addendum)
Progress Note    Shari Lee  HBZ:[id number] DOB: Aug 24, 1956  DOA: 10/06/2020 PCP: Margaretann Loveless, PA-C      Brief Narrative:    Medical records reviewed and are as summarized below:  Shari Lee is a 64 y.o. female with past medical history significant for type 2 diabetes mellitus, essential hypertension, obesity who presented to the emergency department with 1 week history of nausea, generalized malaise, cough and shortness of breath.  She was admitted to the hospital COVID-19 pneumonia complicated by acute hypoxic respiratory failure.  She was treated with steroids, IV remdesivir and baricitinib.  She also required up to 9 L/min oxygen via high flow nasal cannula.    Assessment/Plan:   Principal Problem:   Acute respiratory disease due to COVID-19 virus Active Problems:   Diabetes mellitus type 2, uncontrolled ([facility])   Essential (primary) hypertension    Body mass index is 36.58 kg/m.  (Morbid obesity)   Acute hypoxic respiratory failure secondary to acute Covid-19 viral pneumonia --COVID test: + PCR 10/05/2020 --She is down to 2 L/min oxygen via nasal cannula.  Taper off oxygen as able. --Completed IV remdesivir on 10/10/2020 --Continue prednisone and baricitinib. --Continue airborne/contact isolation precautions for 3 weeks from the day of diagnosis   Type 2 diabetes mellitus, poorly controlled Home regimen includes empagliflozin-metformin 5-500mg  BID.  Hemoglobin A1c 13.5 on 10/01/2020. --Hold oral hypoglycemics while inpatient --Continue Levemir 10 units twice daily --Tradjenta 5 mg p.o. daily --NovoLog as needed for hyperglycemia.   Hypokalemia Improved.   Intermittent sinus bradycardia Asymptomatic.  It appears it mostly occurs when she is sleeping at night.  She is on atenolol.  Continue to monitor.   Essential hypertension --Continue antihypertensives and monitor BP closely.  Generalized weakness --PT  evaluation  Possible discharge home tomorrow with or without oxygen.   Diet Order            Diet heart healthy/carb modified Room service appropriate? Yes; Fluid consistency: Thin  Diet effective now                    Consultants:  None  Procedures:  None    Medications:   . amLODipine  5 mg Oral Daily   And  . benazepril  20 mg Oral Daily  . vitamin C  500 mg Oral Daily  . atenolol  50 mg Oral Daily  . baricitinib  4 mg Oral Daily  . enoxaparin (LOVENOX) injection  0.5 mg/kg Subcutaneous Q24H  . insulin aspart  0-20 Units Subcutaneous TID WC  . insulin aspart  0-5 Units Subcutaneous QHS  . insulin detemir  10 Units Subcutaneous BID  . linagliptin  5 mg Oral Daily  . predniSONE  50 mg Oral Daily  . sodium chloride flush  3 mL Intravenous Q12H  . zinc sulfate  220 mg Oral Daily   Continuous Infusions: . sodium chloride       Anti-infectives (From admission, onward)   Start     Dose/Rate Route Frequency Ordered Stop   10/07/20 1000  remdesivir 100 mg in sodium chloride 0.9 % 100 mL IVPB       "Followed by" Linked Group Details   100 mg 200 mL/hr over 30 Minutes Intravenous Daily 10/06/20 2217 10/10/20 0930   10/06/20 2300  remdesivir 200 mg in sodium chloride 0.9% 250 mL IVPB       "Followed by" Linked Group Details   200 mg 580 mL/hr over 30 Minutes Intravenous  Once 10/06/20 2217 10/07/20 0129             Family Communication/Anticipated D/C date and plan/Code Status   DVT prophylaxis:      Code Status: Full Code  Family Communication: Plan discussed with her daughter, Alyse Low. Disposition Plan:    Status is: Inpatient  Remains inpatient appropriate because:severe hypoxia   Dispo: The patient is from: Home              Anticipated d/c is to: Home              Anticipated d/c date is: 1 day              Patient currently is not medically stable to d/c.           Subjective:   Interval events noted.  No complaints.   She is feeling better.  Objective:    Vitals:   10/12/20 2130 10/13/20 0023 10/13/20 0500 10/13/20 0736  BP: (!) 154/81 139/71 (!) 153/67 138/70  Pulse: 62 (!) 55 (!) 54 63  Resp: 18 18 16 16   Temp: 98.4 F (36.9 C) 98.1 F (36.7 C) 97.7 F (36.5 C) 98.7 F (37.1 C)  TempSrc: Oral Oral Oral   SpO2: 95% 99% 94% 97%  Weight:      Height:       No data found.  No intake or output data in the 24 hours ending 10/13/20 1024 Filed Weights   10/06/20 2123  Weight: 90.7 kg    Exam:   GEN: NAD SKIN: Warm and dry EYES: EOMI ENT: MMM CV: RRR PULM: Bibasilar rales.  No wheezing heard ABD: soft, ND, NT, +BS CNS: AAO x 3, non focal EXT: No edema or tenderness      Data Reviewed:   I have personally reviewed following labs and imaging studies:  Labs: Labs show the following:   Basic Metabolic Panel: Recent Labs  Lab 10/08/20 0406 10/09/20 0423 10/10/20 0511 10/11/20 0432 10/12/20 0427 10/13/20 0415  NA 130* 135 136 135 138 134*  K 4.0 4.4 4.0 3.5 3.2* 3.9  CL 98 101 101 100 102 99  CO2 21* 26 25 27 28 29   GLUCOSE 326* 257* 258* 169* 92 86  BUN 28* 25* 24* 21 17 16   CREATININE 0.71 0.63 0.65 0.66 0.54 0.52  CALCIUM 8.3* 8.5* 8.2* 7.8* 7.7* 7.9*  MG 2.3  --   --   --  2.5*  --   PHOS 4.0  --   --   --   --   --    GFR Estimated Creatinine Clearance: 75.3 mL/min (by C-G formula based on SCr of 0.52 mg/dL). Liver Function Tests: Recent Labs  Lab 10/08/20 0406 10/09/20 0423 10/10/20 0511 10/11/20 0432 10/12/20 0427  AST 34 36 47* 46* 38  ALT 22 25 35 44 44  ALKPHOS 59 59 59 51 43  BILITOT 0.6 0.5 0.9 0.7 0.7  PROT 6.4* 6.3* 5.9* 5.2* 5.3*  ALBUMIN 2.7* 2.7* 2.6* 2.4* 2.4*   No results for input(s): LIPASE, AMYLASE in the last 168 hours. No results for input(s): AMMONIA in the last 168 hours. Coagulation profile No results for input(s): INR, PROTIME in the last 168 hours.  CBC: Recent Labs  Lab 10/09/20 0423 10/10/20 0511 10/11/20 0432  10/12/20 0427 10/13/20 0415  WBC 15.3* 15.1* 15.9* 18.2* 11.3*  NEUTROABS 13.0* 12.7* 11.8* 13.8* 7.9*  HGB 13.1 12.9 12.0 12.3 12.3  HCT 38.6 37.7 35.8* 35.4* 36.9  MCV 83.0 82.3 82.5 82.1 83.5  PLT 423* 445* 410* 417* 388   Cardiac Enzymes: No results for input(s): CKTOTAL, CKMB, CKMBINDEX, TROPONINI in the last 168 hours. BNP (last 3 results) No results for input(s): PROBNP in the last 8760 hours. CBG: Recent Labs  Lab 10/12/20 0836 10/12/20 1145 10/12/20 1701 10/12/20 2127 10/13/20 0738  GLUCAP 93 126* 251* 231* 91   D-Dimer: No results for input(s): DDIMER in the last 72 hours. Hgb A1c: No results for input(s): HGBA1C in the last 72 hours. Lipid Profile: No results for input(s): CHOL, HDL, LDLCALC, TRIG, CHOLHDL, LDLDIRECT in the last 72 hours. Thyroid function studies: No results for input(s): TSH, T4TOTAL, T3FREE, THYROIDAB in the last 72 hours.  Invalid input(s): FREET3 Anemia work up: No results for input(s): VITAMINB12, FOLATE, FERRITIN, TIBC, IRON, RETICCTPCT in the last 72 hours. Sepsis Labs: Recent Labs  Lab 10/07/20 0422 10/08/20 0406 10/10/20 0511 10/11/20 0432 10/12/20 0427 10/13/20 0415  PROCALCITON <0.10  --   --   --   --   --   WBC  --    < > 15.1* 15.9* 18.2* 11.3*   < > = values in this interval not displayed.    Microbiology Recent Results (from the past 240 hour(s))  Resp Panel by RT-PCR (Flu A&B, Covid) Nasopharyngeal Swab     Status: Abnormal   Collection Time: 10/05/20  1:41 PM   Specimen: Nasopharyngeal Swab; Nasopharyngeal(NP) swabs in vial transport medium  Result Value Ref Range Status   SARS Coronavirus 2 by RT PCR POSITIVE (A) NEGATIVE Final    Comment: RESULT CALLED TO, READ BACK BY AND VERIFIED WITH: TAMMY JUDE 10/05/20 AT 1434 BY ACR (NOTE) SARS-CoV-2 target nucleic acids are DETECTED.  The SARS-CoV-2 RNA is generally detectable in upper respiratory specimens during the acute phase of infection. Positive results  are indicative of the presence of the identified virus, but do not rule out bacterial infection or co-infection with other pathogens not detected by the test. Clinical correlation with patient history and other diagnostic information is necessary to determine patient infection status. The expected result is Negative.  Fact Sheet for Patients: EntrepreneurPulse.com.au  Fact Sheet for Healthcare Providers: IncredibleEmployment.be  This test is not yet approved or cleared by the [place] FDA and  has been authorized for detection and/or diagnosis of SARS-CoV-2 by FDA under an Emergency Use Authorization (EUA).  This EUA will remain in effect (meaning this test can b e used) for the duration of  the COVID-19 declaration under Section 564(b)(1) of the Act, 21 U.S.C. section 360bbb-3(b)(1), unless the authorization is terminated or revoked sooner.     Influenza A by PCR NEGATIVE NEGATIVE Final   Influenza B by PCR NEGATIVE NEGATIVE Final    Comment: (NOTE) The Xpert Xpress SARS-CoV-2/FLU/RSV plus assay is intended as an aid in the diagnosis of influenza from Nasopharyngeal swab specimens and should not be used as a sole basis for treatment. Nasal washings and aspirates are unacceptable for Xpert Xpress SARS-CoV-2/FLU/RSV testing.  Fact Sheet for Patients: EntrepreneurPulse.com.au  Fact Sheet for Healthcare Providers: IncredibleEmployment.be  This test is not yet approved or cleared by the [place] FDA and has been authorized for detection and/or diagnosis of SARS-CoV-2 by FDA under an Emergency Use Authorization (EUA). This EUA will remain in effect (meaning this test can be used) for the duration of the COVID-19 declaration under Section 564(b)(1) of the Act, 21 U.S.C. section 360bbb-3(b)(1), unless the authorization is terminated or revoked.  Performed  at [facility], [address], [place], [place] [address]     Procedures and diagnostic studies:  No results found.             LOS: 7 days   Oluwatomisin Hustead  [facility]   Pager on [email or web address]. If 7PM-7AM, please contact night-coverage at [email or web address]     10/13/2020, 10:24 AM

## 2020-10-13 NOTE — Evaluation (Signed)
Physical Therapy Evaluation Patient Details Name: Shari Lee MRN: [id number] DOB: 25-Sep-1956 Today's Date: 10/13/2020   History of Present Illness  Shari Lee is a 64 y.o. female with past medical history significant for type 2 diabetes mellitus, essential hypertension, obesity who presented to the emergency department with 1 week history of nausea, generalized malaise, cough and shortness of breath. She was admitted to the hospital [id number] pneumonia complicated by acute hypoxic respiratory failure.  Clinical Impression  Prior to admission pt was independent and working as a Programmer, applications. Pt seen for PT evaluation & pt currently requires 1UE HHA min assist to ambulate 40 ft in room. Pt on 2L/min supplemental oxygen without desaturation during mobility. Pt notes she is somewhat fatigued after gait. PT educated pt on need to ambulate to bathroom PRN with nursing staff throughout the day vs using [facility] for strengthening purposes. PT also educates pt on importance of OOB to recliner vs lying in bed throughout the day. Pt voiced understanding of all info. Will continue to follow pt acutely to focus on endurance & balance.     Follow Up Recommendations Home health PT;Supervision for mobility/OOB    Equipment Recommendations  Rolling walker with 5" wheels    Recommendations for Other Services       Precautions / Restrictions Precautions Precautions: Fall Restrictions Weight Bearing Restrictions: No      Mobility  Bed Mobility Overal bed mobility: Modified Independent Bed Mobility: Supine to Sit     Supine to sit: Modified independent (Device/Increase time);HOB elevated          Transfers Overall transfer level: Needs assistance Equipment used: None Transfers: Sit to/from [facility] Sit to Stand: Min guard Stand pivot transfers: Min guard          Ambulation/Gait Ambulation/Gait assistance: Min assist Gait Distance (Feet): 40 Feet Assistive  device: 1 person hand held assist Gait Pattern/deviations: Decreased stride length     General Gait Details: 10 ft x 4 laps in room, pt occasionally reaching for window sill for support/steadying assist so PT provides 1UE HHA  Stairs            Wheelchair Mobility    Modified Rankin (Stroke Patients Only)       Balance Overall balance assessment: Needs assistance           Standing balance-Leahy Scale: Fair Standing balance comment: no UE static standing with close supervision, min assist<>CGA gait in room with 1UE HHA                             Pertinent Vitals/Pain Pain Assessment: No/denies pain    Home Living Family/patient expects to be discharged to:: Private residence Living Arrangements: Alone Available Help at Discharge:  (pt reports she will only have assistance at d/c if she is no longer COVID (+)) Type of Home: Apartment Home Access: Stairs to enter Entrance Stairs-Rails: Left Entrance Stairs-Number of Steps: 3 Home Layout: One level Home Equipment: None      Prior Function Level of Independence: Independent         Comments: works as a Warden/ranger        Extremity/Trunk Assessment   Upper Extremity Assessment Upper Extremity Assessment: Overall WFL for tasks assessed    Lower Extremity Assessment Lower Extremity Assessment: Generalized weakness       Communication   Communication: No difficulties  Cognition Arousal/Alertness: Awake/alert  Behavior During Therapy: WFL for tasks assessed/performed Overall Cognitive Status: Within Functional Limits for tasks assessed                                        General Comments General comments (skin integrity, edema, etc.): on 2L/min supplemental oxygen via nasal cannula, lowest SpO2 = 94%    Exercises     Assessment/Plan    PT Assessment Patient needs continued PT services  PT Problem List Decreased strength;Decreased  mobility;Decreased activity tolerance;Cardiopulmonary status limiting activity;Decreased knowledge of use of DME;Decreased balance       PT Treatment Interventions DME instruction;Therapeutic activities;Gait training;Therapeutic exercise;Patient/family education;Stair training;Balance training;Functional mobility training;Neuromuscular re-education    PT Goals (Current goals can be found in the Care Plan section)  Acute Rehab PT Goals Patient Stated Goal: none stated PT Goal Formulation: With patient Time For Goal Achievement: 10/27/20 Potential to Achieve Goals: Good    Frequency Min 2X/week   Barriers to discharge Decreased caregiver support no assistance at home    Co-evaluation               AM-PAC PT "6 Clicks" Mobility  Outcome Measure Help needed turning from your back to your side while in a flat bed without using bedrails?: None Help needed moving from lying on your back to sitting on the side of a flat bed without using bedrails?: None Help needed moving to and from a bed to a chair (including a wheelchair)?: A Little Help needed standing up from a chair using your arms (e.g., wheelchair or bedside chair)?: A Little Help needed to walk in hospital room?: A Little Help needed climbing 3-5 steps with a railing? : A Little 6 Click Score: 20    End of Session Equipment Utilized During Treatment: Oxygen Activity Tolerance: Patient tolerated treatment well Patient left: in chair;with call bell/phone within reach Nurse Communication: Mobility status PT Visit Diagnosis: Muscle weakness (generalized) (M62.81);Unsteadiness on feet (R26.81)    Time: 1125-1140 PT Time Calculation (min) (ACUTE ONLY): 15 min   Charges:   PT Evaluation $PT Eval Low Complexity: [place], PT, DPT 10/13/20, 12:28 PM   Waunita Schooner 10/13/2020, 12:27 PM

## 2020-10-13 NOTE — Progress Notes (Signed)
Used demonstration and teach back method for patient to be able to give her own Novolog  Injection for her lunch time sliding scale meal coverage.

## 2020-10-13 NOTE — Progress Notes (Signed)
Inpatient Diabetes Program Recommendations  AACE/ADA: New Consensus Statement on Inpatient Glycemic Control (2015)  Target Ranges:  Prepandial:   less than 140 mg/dL      Peak postprandial:   less than 180 mg/dL (1-2 hours)      Critically ill patients:  140 - 180 mg/dL   Results for Shari Lee, Shari Lee (MRN [id number]) as of 10/13/2020 10:48  Ref. Range 10/12/2020 08:36 10/12/2020 11:45 10/12/2020 17:01 10/12/2020 21:27  Glucose-Capillary Latest Ref Range: 70 - 99 mg/dL 93 762 (H) 831 (H) 517 (H)   Results for Shari Lee, Shari Lee (MRN [id number]) as of 10/13/2020 10:48  Ref. Range 10/13/2020 07:38  Glucose-Capillary Latest Ref Range: 70 - 99 mg/dL 91   Results for Shari Lee, Shari Lee (MRN [id number]) as of 10/07/2020 10:13  Ref. Range 10/01/2020 14:35  Hemoglobin A1C Latest Ref Range: 4.8 - 5.6 % 13.5 (H)  (341 mg/dl)   Admit with:COVID+  History:DM  Home DM Meds:Synjardy 5/500 mg BID Ozempic (NOT taking) Glyburide 2.5 mg BID (was told to Stop taking per PCP notes)  Current Orders:Novolog Resistant Correction Scale/ SSI (0-20 units) TID AC + HS Tradjenta 5 mg Daily                            Levemir 10 units BID    Saw her PCP Shari Man, PA with [facility] on 10/01/2020--A1c was elevated to 13.5% at that visit--Pt told her PCP she was not taking the Ozempic--PCP noted A1c was elevated and got pt to agree to start Synjardy 5/500 mg BID at that visit and to Stop the Glyburide  MD- If decision made to discharge pt home on insulin, please make sure to gove pt Rxs for Insulin pens and Insulin pen needles: Levemir Insulin Pen- Order # [id number] Insulin Pen needles- Order # [id number]   Note Solumedrol stopped--last dose given 12/23 at 10pm--Started on Prednisone 50 mg daily 12/24.  Levemir has been significantly reduced with success now that steroids decreased  Spoke w/ pt's  daughter by phone this AM.  Gave daughter an update on pt's current CBGs and current insulin regimen.  Emailed daughter a youtube video (on 12/23) on using insulin pens at home so she can assist patient with insulin if needed if pt discharged home on insulin.  Also emailed pt the same insulin pen video.  Daughter requested I call pt today to discuss her A1c and the importance of good CBG control.  Spoke with pt on phone today.  Discussed with pt current A1c of 13.5% and the importance of good CBG control at home.  We talked about pt's current insulin needs due to the COVID infection and steroids.  Pt open to using insulin at home at time of discharge if needed.  I discussed with pt that ultimately the MD's will decide if pt will need insulin for home, however, I wanted pt to be prepared in case that decision was made.  We discussed the difference between injecting with Insulin pens versus her Ozempic that she used to take.  I emphasized that pt will need to clean the pen with an alcohol swab and attach a pen needle by screwing the needle on (lefty loosey righty tighty).  We also discussed dialing up her dose and holding the needle in the skin for 10 seconds after injecting just like she did when she gave her Ozempic.  We also discussed the importance of removing the needle from the pen after injection  and not throwing the pen away as pt will use it multiple times (until empty or until 30-35 days open).  Pt instructed to never store pen with the needle on.  Daughter was also given these verbal instructions as well.  I have asked the RN working with pt today to allow pt to give all injections.  I also called daughter back and told her about the conversation I had with the patient.    --Will follow patient during hospitalization--  Ambrose Finland RN, MSN, CDE Diabetes Coordinator Inpatient Glycemic Control Team Team Pager: [phone] (8a-5p)

## 2020-10-14 LAB — GLUCOSE, CAPILLARY: Glucose-Capillary: 112 mg/dL — ABNORMAL HIGH (ref 70–99)

## 2020-10-14 NOTE — TOC Transition Note (Signed)
Transition of Care [facility]) - CM/SW Discharge Note   Patient Details  Name: Shari Lee MRN: [id number] Date of Birth: [birth year]  [facility] of Care [facility]) CM/SW Contact:  Margarito Liner, LCSW Phone Number: 10/14/2020, 12:05 PM   Clinical Narrative:  Patient has orders to discharge home today. [facility] is able to accept for home health PT but cannot start for a week. Patient is aware and agreeable. No further concerns. CSW signing off.   Final next level of care: Home w Home Health Services Barriers to Discharge: No Barriers Identified   Patient Goals and CMS Choice     Choice offered to / list presented to : Patient  Discharge Placement                    Patient and family notified of of transfer: 10/14/20  Discharge Plan and Services     Post Acute Care Choice: Home Health                    HH Arranged: PT [facility] Agency: [facility] Date [facility] Agency Contacted: 10/14/20   Representative spoke with at [facility] Agency: Mellissa Kohut  Social Determinants of Health (SDOH) Interventions     Readmission Risk Interventions No flowsheet data found.

## 2020-10-14 NOTE — TOC Initial Note (Addendum)
Transition of Care [facility]) - Initial/Assessment Note    Patient Details  Name: Shari Lee MRN: [id number] Date of Birth: December 24, 1955  [facility]) CM/SW Contact:    Margarito Liner, LCSW Phone Number: 10/14/2020, 10:15 AM  Clinical Narrative: CSW called patient in room due to COVID precautions. CSW introduced role and explained that PT recommendations would be discussed. Patient agreeable to HHPT. No agency preference. [facility] is reviewing referral. CSW notified patient of DME recommendation for a rolling walker but she declined. No further concerns. CSW encouraged patient to contact CSW as needed. CSW will continue to follow patient and facilitate discharge home today.          10:29 am: [facility], and [facility] unable to accept. [facility] is reviewing. Left messages for [facility], and [facility].         Expected Discharge Plan: Home w Home Health Services Barriers to Discharge: No Barriers Identified   Patient Goals and CMS Choice        Expected Discharge Plan and Services Expected Discharge Plan: Home w Home Health Services     Post Acute Care Choice: Home Health Living arrangements for the past 2 months: Apartment Expected Discharge Date: 10/14/20                                    Prior Living Arrangements/Services Living arrangements for the past 2 months: Apartment Lives with:: Self Patient language and need for interpreter reviewed:: Yes Do you feel safe going back to the place where you live?: Yes      Need for Family Participation in Patient Care: Yes (Comment)     Criminal Activity/Legal Involvement Pertinent to Current Situation/Hospitalization: No - Comment as needed  Activities of Daily Living Home Assistive Devices/Equipment: None ADL Screening (condition at time of admission) Patient's cognitive ability adequate to safely complete daily activities?: Yes Is the patient deaf or have difficulty  hearing?: No Does the patient have difficulty seeing, even when wearing glasses/contacts?: No Does the patient have difficulty concentrating, remembering, or making decisions?: No Patient able to express need for assistance with ADLs?: Yes Does the patient have difficulty dressing or bathing?: No Independently performs ADLs?: Yes (appropriate for developmental age) Does the patient have difficulty walking or climbing stairs?: Yes Weakness of Legs: None Weakness of Arms/Hands: None  Permission Sought/Granted Permission sought to share information with : Facility Industrial/product designer granted to share information with : Yes, Verbal Permission Granted     Permission granted to share info w AGENCY: Home Health Agencies        Emotional Assessment Appearance:: Appears stated age Attitude/Demeanor/Rapport: Engaged,Gracious Affect (typically observed): Accepting,Appropriate,Calm,Pleasant Orientation: : Oriented to Self,Oriented to Place,Oriented to  Time,Oriented to Situation Alcohol / Substance Use: Not Applicable Psych Involvement: No (comment)  Admission diagnosis:  Hypoxia [R09.02] Acute respiratory disease due to COVID-19 virus [U07.1, J06.9] COVID-19 [U07.1] Patient Active Problem List   Diagnosis Date Noted  . Acute respiratory disease due to COVID-19 virus 10/06/2020  . Type 2 diabetes mellitus (HCC) 07/28/2015  . 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 Diabetes mellitus type 2, uncontrolled (HCC) 04/29/2015  . History of digestive disease 04/29/2015  . 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 Hypercholesterolemia without hypertriglyceridemia 12/11/2009  . Apnea, sleep 07/30/2008  . Colon, diverticulosis 06/07/2008  . Essential (primary) hypertension 04/25/2006   PCP:  Margaretann Loveless, PA-C [facility]acy:   [facility] [address]), [place] - [address]) [place] [address] Phone: [phone]  Fax: [phone]     Social Determinants of Health (SDOH) Interventions    Readmission Risk Interventions No flowsheet data found.

## 2020-10-14 NOTE — Plan of Care (Signed)

## 2020-10-14 NOTE — Clinical Social Work Note (Signed)
Tried calling in room to discuss home health recommendations. No answer. Will try again later.  Charlynn Court, CSW [phone]

## 2020-10-14 NOTE — Discharge Summary (Signed)
Physician Discharge Summary  Shari Lee YKZ:[id number] DOB: 1956/09/08 DOA: 10/06/2020  PCP: Mar Daring, PA-C  Admit date: 10/06/2020 Discharge date: 10/14/2020  Discharge disposition: Home with home health PT   Recommendations for Outpatient Follow-Up:   Follow-up with PCP in 1 week   Discharge Diagnosis:   Principal Problem:   Acute respiratory disease due to COVID-19 virus Active Problems:   Diabetes mellitus type 2, uncontrolled ([place])   Essential (primary) hypertension    Discharge Condition: Stable.  Diet recommendation:  Diet Order            Diet - low sodium heart healthy           Diet heart healthy/carb modified Room service appropriate? Yes; Fluid consistency: Thin  Diet effective now                   Code Status: Full Code     Hospital Course:   [name] is a 64 y.o. female with past medical history significant for type 2 diabetes mellitus, essential hypertension, obesity who presented to the emergency department with 1 week history of nausea, generalized malaise, cough and shortness of breath.  She was admitted to the hospital [id number] pneumonia complicated by acute hypoxic respiratory failure.  She was treated with steroids, IV remdesivir and baricitinib.  She also required up to 9 L/min oxygen via high flow nasal cannula.  Her condition slowly improved and she was eventually weaned off of oxygen completely. She is deemed stable for discharge home.  Prior to discharge, her oxygen saturation with ambulation was 95% on room air.       Discharge Exam:    Vitals:   10/13/20 1617 10/13/20 2115 10/14/20 0006 10/14/20 0756  BP: (!) 148/74 (!) 142/58 (!) 141/72 138/78  Pulse: 64 60 (!) 57 63  Resp: 20 16 16 15   Temp: 97.6 F (36.4 C) (!) 97.4 F (36.3 C) 97.9 F (36.6 C) 98.2 F (36.8 C)  TempSrc: Oral Oral Oral Oral  SpO2: 100% 99% 95% 93%  Weight:      Height:         GEN: NAD SKIN: Warm and dry EYES:  EOMI ENT: MMM CV: RRR PULM: Bibasilar rales. No wheezing ABD: soft, ND, NT, +BS CNS: AAO x 3, non focal EXT: No edema or tenderness   The results of significant diagnostics from this hospitalization (including imaging, microbiology, ancillary and laboratory) are listed below for reference.     Procedures and Diagnostic Studies:   DG Chest Portable 1 View  Result Date: 10/06/2020 CLINICAL DATA:  COVID positive with shortness of breath. EXAM: PORTABLE CHEST 1 VIEW COMPARISON:  October 05, 2020 FINDINGS: Mild to moderate severity infiltrates are seen within the mid left lung and bilateral lung bases. This is mildly increased in severity when compared to the prior study. There is no evidence of a pleural effusion or pneumothorax. The heart size and mediastinal contours are within normal limits. There is mild calcification of the aortic arch. Degenerative changes seen throughout the thoracic spine. IMPRESSION: Mild to moderate severity bilateral infiltrates, mildly increased in severity when compared to the prior study. Electronically Signed   By: Virgina Norfolk M.D.   On: 10/06/2020 22:01     Labs:   Basic Metabolic Panel: Recent Labs  Lab 10/08/20 0406 10/09/20 0423 10/10/20 0511 10/11/20 0432 10/12/20 0427 10/13/20 0415  NA 130* 135 136 135 138 134*  K 4.0 4.4 4.0 3.5 3.2* 3.9  CL  98 101 101 100 102 99  CO2 21* 26 25 27 28 29   GLUCOSE 326* 257* 258* 169* 92 86  BUN 28* 25* 24* 21 17 16   CREATININE 0.71 0.63 0.65 0.66 0.54 0.52  CALCIUM 8.3* 8.5* 8.2* 7.8* 7.7* 7.9*  MG 2.3  --   --   --  2.5*  --   PHOS 4.0  --   --   --   --   --    GFR Estimated Creatinine Clearance: 75.3 mL/min (by C-G formula based on SCr of 0.52 mg/dL). Liver Function Tests: Recent Labs  Lab 10/08/20 0406 10/09/20 0423 10/10/20 0511 10/11/20 0432 10/12/20 0427  AST 34 36 47* 46* 38  ALT 22 25 35 44 44  ALKPHOS 59 59 59 51 43  BILITOT 0.6 0.5 0.9 0.7 0.7  PROT 6.4* 6.3* 5.9* 5.2* 5.3*   ALBUMIN 2.7* 2.7* 2.6* 2.4* 2.4*   No results for input(s): LIPASE, AMYLASE in the last 168 hours. No results for input(s): AMMONIA in the last 168 hours. Coagulation profile No results for input(s): INR, PROTIME in the last 168 hours.  CBC: Recent Labs  Lab 10/09/20 0423 10/10/20 0511 10/11/20 0432 10/12/20 0427 10/13/20 0415  WBC 15.3* 15.1* 15.9* 18.2* 11.3*  NEUTROABS 13.0* 12.7* 11.8* 13.8* 7.9*  HGB 13.1 12.9 12.0 12.3 12.3  HCT 38.6 37.7 35.8* 35.4* 36.9  MCV 83.0 82.3 82.5 82.1 83.5  PLT 423* 445* 410* 417* 388   Cardiac Enzymes: No results for input(s): CKTOTAL, CKMB, CKMBINDEX, TROPONINI in the last 168 hours. BNP: Invalid input(s): POCBNP CBG: Recent Labs  Lab 10/13/20 0738 10/13/20 1214 10/13/20 1618 10/13/20 2117 10/14/20 0803  GLUCAP 91 184* 295* 287* 112*   D-Dimer No results for input(s): DDIMER in the last 72 hours. Hgb A1c No results for input(s): HGBA1C in the last 72 hours. Lipid Profile No results for input(s): CHOL, HDL, LDLCALC, TRIG, CHOLHDL, LDLDIRECT in the last 72 hours. Thyroid function studies No results for input(s): TSH, T4TOTAL, T3FREE, THYROIDAB in the last 72 hours.  Invalid input(s): FREET3 Anemia work up No results for input(s): VITAMINB12, FOLATE, FERRITIN, TIBC, IRON, RETICCTPCT in the last 72 hours. Microbiology Recent Results (from the past 240 hour(s))  Resp Panel by RT-PCR (Flu A&B, Covid) Nasopharyngeal Swab     Status: Abnormal   Collection Time: 10/05/20  1:41 PM   Specimen: Nasopharyngeal Swab; Nasopharyngeal(NP) swabs in vial transport medium  Result Value Ref Range Status   SARS Coronavirus 2 by RT PCR POSITIVE (A) NEGATIVE Final    Comment: RESULT CALLED TO, READ BACK BY AND VERIFIED WITH: TAMMY JUDE 10/05/20 AT 1434 BY ACR (NOTE) SARS-CoV-2 target nucleic acids are DETECTED.  The SARS-CoV-2 RNA is generally detectable in upper respiratory specimens during the acute phase of infection. Positive results  are indicative of the presence of the identified virus, but do not rule out bacterial infection or co-infection with other pathogens not detected by the test. Clinical correlation with patient history and other diagnostic information is necessary to determine patient infection status. The expected result is Negative.  Fact Sheet for Patients: EntrepreneurPulse.com.au  Fact Sheet for Healthcare Providers: IncredibleEmployment.be  This test is not yet approved or cleared by the [place] FDA and  has been authorized for detection and/or diagnosis of SARS-CoV-2 by FDA under an Emergency Use Authorization (EUA).  This EUA will remain in effect (meaning this test can b e used) for the duration of  the COVID-19 declaration under Section 564(b)(1)  of the Act, 21 U.S.C. section 360bbb-3(b)(1), unless the authorization is terminated or revoked sooner.     Influenza A by PCR NEGATIVE NEGATIVE Final   Influenza B by PCR NEGATIVE NEGATIVE Final    Comment: (NOTE) The Xpert Xpress SARS-CoV-2/FLU/RSV plus assay is intended as an aid in the diagnosis of influenza from Nasopharyngeal swab specimens and should not be used as a sole basis for treatment. Nasal washings and aspirates are unacceptable for Xpert Xpress SARS-CoV-2/FLU/RSV testing.  Fact Sheet for Patients: EntrepreneurPulse.com.au  Fact Sheet for Healthcare Providers: IncredibleEmployment.be  This test is not yet approved or cleared by the [place] FDA and has been authorized for detection and/or diagnosis of SARS-CoV-2 by FDA under an Emergency Use Authorization (EUA). This EUA will remain in effect (meaning this test can be used) for the duration of the COVID-19 declaration under Section 564(b)(1) of the Act, 21 U.S.C. section 360bbb-3(b)(1), unless the authorization is terminated or revoked.  Performed at [facility], [address] [place], [place] [address]      Discharge Instructions:   Discharge Instructions    Call MD for:  difficulty breathing, headache or visual disturbances   Complete by: As directed    Call MD for:  extreme fatigue   Complete by: As directed    Call MD for:  persistant dizziness or light-headedness   Complete by: As directed    Call MD for:  persistant nausea and vomiting   Complete by: As directed    Call MD for:  temperature >100.4   Complete by: As directed    Diet - low sodium heart healthy   Complete by: As directed    Discharge instructions   Complete by: As directed    Isolate at home for 12 days.   ?   Person Under Monitoring Name: Shari Lee  Location: [place] [id number]   Infection Prevention Recommendations for Individuals Confirmed to have, or Being Evaluated for, 2019 Novel Coronavirus (COVID-19) Infection Who Receive Care at Home  Individuals who are confirmed to have, or are being evaluated for, COVID-19 should follow the prevention steps below until a healthcare provider or local or state health department says they can return to normal activities.  Stay home except to get medical care You should restrict activities outside your home, except for getting medical care. Do not go to work, school, or public areas, and do not use public transportation or taxis.  Call ahead before visiting your doctor Before your medical appointment, call the healthcare provider and tell them that you have, or are being evaluated for, COVID-19 infection. This will help the healthcare provider's office take steps to keep other people from getting infected. Ask your healthcare provider to call the local or state health department.  Monitor your symptoms Seek prompt medical attention if your illness is worsening (e.g., difficulty breathing). Before going to your medical appointment, call the healthcare provider and tell them that you have, or are being  evaluated for, COVID-19 infection. Ask your healthcare provider to call the local or state health department.  Wear a facemask You should wear a facemask that covers your nose and mouth when you are in the same room with other people and when you visit a healthcare provider. People who live with or visit you should also wear a facemask while they are in the same room with you.  Separate yourself from other people in your home As much as possible, you should stay in  a different room from other people in your home. Also, you should use a separate bathroom, if available.  Avoid sharing household items You should not share dishes, drinking glasses, cups, eating utensils, towels, bedding, or other items with other people in your home. After using these items, you should wash them thoroughly with soap and water.  Cover your coughs and sneezes Cover your mouth and nose with a tissue when you cough or sneeze, or you can cough or sneeze into your sleeve. Throw used tissues in a lined trash can, and immediately wash your hands with soap and water for at least 20 seconds or use an alcohol-based hand rub.  Wash your [facility] your hands often and thoroughly with soap and water for at least 20 seconds. You can use an alcohol-based hand sanitizer if soap and water are not available and if your hands are not visibly dirty. Avoid touching your eyes, nose, and mouth with unwashed hands.   Prevention Steps for Caregivers and Household Members of Individuals Confirmed to have, or Being Evaluated for, COVID-19 Infection Being Cared for in the Home  If you live with, or provide care at home for, a person confirmed to have, or being evaluated for, COVID-19 infection please follow these guidelines to prevent infection:  Follow healthcare provider's instructions Make sure that you understand and can help the patient follow any healthcare provider instructions for all care.  Provide for the patient's  basic needs You should help the patient with basic needs in the home and provide support for getting groceries, prescriptions, and other personal needs.  Monitor the patient's symptoms If they are getting sicker, call his or her medical provider and tell them that the patient has, or is being evaluated for, COVID-19 infection. This will help the healthcare provider's office take steps to keep other people from getting infected. Ask the healthcare provider to call the local or state health department.  Limit the number of people who have contact with the patient If possible, have only one caregiver for the patient. Other household members should stay in another home or place of residence. If this is not possible, they should stay in another room, or be separated from the patient as much as possible. Use a separate bathroom, if available. Restrict visitors who do not have an essential need to be in the home.  Keep older adults, very young children, and other sick people away from the patient Keep older adults, very young children, and those who have compromised immune systems or chronic health conditions away from the patient. This includes people with chronic heart, lung, or kidney conditions, diabetes, and cancer.  Ensure good ventilation Make sure that shared spaces in the home have good air flow, such as from an air conditioner or an opened window, weather permitting.  Wash your hands often Wash your hands often and thoroughly with soap and water for at least 20 seconds. You can use an alcohol based hand sanitizer if soap and water are not available and if your hands are not visibly dirty. Avoid touching your eyes, nose, and mouth with unwashed hands. Use disposable paper towels to dry your hands. If not available, use dedicated cloth towels and replace them when they become wet.  Wear a facemask and gloves Wear a disposable facemask at all times in the room and gloves when you touch or  have contact with the patient's blood, body fluids, and/or secretions or excretions, such as sweat, saliva, sputum, nasal mucus, vomit, urine,  or feces.  Ensure the mask fits over your nose and mouth tightly, and do not touch it during use. Throw out disposable facemasks and gloves after using them. Do not reuse. Wash your hands immediately after removing your facemask and gloves. If your personal clothing becomes contaminated, carefully remove clothing and launder. Wash your hands after handling contaminated clothing. Place all used disposable facemasks, gloves, and other waste in a lined container before disposing them with other household waste. Remove gloves and wash your hands immediately after handling these items.  Do not share dishes, glasses, or other household items with the patient Avoid sharing household items. You should not share dishes, drinking glasses, cups, eating utensils, towels, bedding, or other items with a patient who is confirmed to have, or being evaluated for, COVID-19 infection. After the person uses these items, you should wash them thoroughly with soap and water.  Wash laundry thoroughly Immediately remove and wash clothes or bedding that have blood, body fluids, and/or secretions or excretions, such as sweat, saliva, sputum, nasal mucus, vomit, urine, or feces, on them. Wear gloves when handling laundry from the patient. Read and follow directions on labels of laundry or clothing items and detergent. In general, wash and dry with the warmest temperatures recommended on the label.  Clean all areas the individual has used often Clean all touchable surfaces, such as counters, tabletops, doorknobs, bathroom fixtures, toilets, phones, keyboards, tablets, and bedside tables, every day. Also, clean any surfaces that may have blood, body fluids, and/or secretions or excretions on them. Wear gloves when cleaning surfaces the patient has come in contact with. Use a diluted  bleach solution (e.g., dilute bleach with 1 part bleach and 10 parts water) or a household disinfectant with a label that says EPA-registered for coronaviruses. To make a bleach solution at home, add 1 tablespoon of bleach to 1 quart (4 cups) of water. For a larger supply, add  cup of bleach to 1 gallon (16 cups) of water. Read labels of cleaning products and follow recommendations provided on product labels. Labels contain instructions for safe and effective use of the cleaning product including precautions you should take when applying the product, such as wearing gloves or eye protection and making sure you have good ventilation during use of the product. Remove gloves and wash hands immediately after cleaning.  Monitor yourself for signs and symptoms of illness Caregivers and household members are considered close contacts, should monitor their health, and will be asked to limit movement outside of the home to the extent possible. Follow the monitoring steps for close contacts listed on the symptom monitoring form.   ? If you have additional questions, contact your local health department or call the epidemiologist on call at [phone] (available 24/7). ? This guidance is subject to change. For the most up-to-date guidance from [facility], please refer to their website: YouBlogs.pl   Face-to-face encounter (required for Medicare/Medicaid patients)   Complete by: As directed    I [place] certify that this patient is under my care and that I, or a nurse practitioner or physician's assistant working with me, had a face-to-face encounter that meets the physician face-to-face encounter requirements with this patient on 10/14/2020. The encounter with the patient was in whole, or in part for the following medical condition(s) which is the primary reason for home health care (List medical condition): COVID Pneumonia, debility   The  encounter with the patient was in whole, or in part, for the following medical condition, which  is the primary reason for home health care: COVID Pneumonia, debility   I certify that, based on my findings, the following services are medically necessary home health services: Physical therapy   Reason for Medically Necessary Home Health Services: Therapy- Personnel officer, Training and development officer and Stair Training   My clinical findings support the need for the above services: Unsafe ambulation due to balance issues   Further, I certify that my clinical findings support that this patient is homebound due to: Unsafe ambulation due to balance issues   Home Health   Complete by: As directed    To provide the following care/treatments: PT   Increase activity slowly   Complete by: As directed      Allergies as of 10/14/2020      Reactions   Amoxicillin-pot Clavulanate    Levsin  [hyoscyamine]    Other reaction(s): Dizzyness   Penicillins    Metformin Diarrhea   Metformin And Related Diarrhea      Medication List    STOP taking these medications   benzonatate 100 MG capsule Commonly known as: Tessalon Perles     TAKE these medications   albuterol 108 (90 Base) MCG/ACT inhaler Commonly known as: VENTOLIN HFA Inhale 2 puffs into the lungs every 6 (six) hours as needed for wheezing or shortness of breath.   amLODipine-benazepril 5-20 MG capsule Commonly known as: LOTREL Take 1 capsule by mouth daily.   atenolol 50 MG tablet Commonly known as: TENORMIN Take 1 tablet (50 mg total) by mouth daily.   CareTouch Safety Lancets 26G Misc 1 Device by Does not apply route daily. To check blood sugar once daily.   Contour Next Test test strip Generic drug: glucose blood To check blood sugar once daily.   Contour Next USB Monitor w/Device Kit 1 kit by Does not apply route daily. To check blood sugar once daily.   Insulin Pen Needle 32G X 4 MM Misc Commonly known as: NovoFine Plus To use with  ozempic injections   ondansetron 4 MG tablet Commonly known as: Zofran Take 1 tablet (4 mg total) by mouth every 8 (eight) hours as needed.   Synjardy 5-500 MG Tabs Generic drug: Empagliflozin-metFORMIN HCl Take 1 tablet by mouth 2 (two) times daily.         Time coordinating discharge: 31 minutes  Signed:  Paulette Lynch  [facility] 10/14/2020, 10:32 AM   Pager on [email or web address]. If 7PM-7AM, please contact night-coverage at [email or web address]

## 2020-10-15 ENCOUNTER — Telehealth: Payer: Self-pay

## 2020-10-15 NOTE — Telephone Encounter (Signed)
Transition Care Management Follow-up Telephone Call  Date of discharge and from where:   How have you been since you were released from the hospital? I am doing okay. Appetite is getting better, staying hydrated. Walking for exercise outside to and from the mailbox several times; paces self. Denies shortness of breath, headache, fever, dizziness, n/v/d. Minimal non productive cough. Monitoring O2 Sats; 95 or higher.   Any questions or concerns? Patient notes she has not received any diabetic medications listed on discharge list (CareTouch Safety Lancets 26G Misc, Contour Next USB Monitor w/Device Kit, Insulin Pen Needle 32G X 4 MM Misc, Synjardy 5-500 MG Tabs). Plans to call and follow up with pharmacy on file. Agrees to contact PCP office if no medications to be received at pharmacy.  Patient also notes she is doing so well she doesn't think [facility] will be needed. However, will wait to hear from them before cancelling.   Items Reviewed:  Did the pt receive and understand the discharge instructions provided? Yes  Medications obtained and verified? Yes. Stop taking benzonatate. Take all scheduled medications as directed. See above for medications reported by patient that she does not have.   Any new allergies since your discharge? No   Dietary orders reviewed? Low sodium, heart healthy Do you have support at home? Yes  ,Sister lives across the street  [place] and Equipment/Supplies: Were home health services ordered? Yes If so, what is the name of the agency? [facility].  Has the agency set up a time to come to the patient's home? Not yet contacted. Notes it would be a couple of days before Agency would contact.  Were any new equipment or medical supplies ordered?  No.  Functional Questionnaire: (I = Independent and D = Dependent) ADLs: i  Bathing/Dressing- i  Meal Prep- i  Eating- i  Maintaining continence- i  Transferring/Ambulation- i  Managing Meds- i  Follow up  appointments reviewed:   PCP Hospital f/u appt confirmed? Yes  Scheduled to see Fenton Malling on 10/22/20 @ 9:40 via virtual video [phone].  Specialist follow up confirmed? None required at this time.   Are transportation arrangements needed? No   If their condition worsens, is the pt aware to call PCP or go to the Emergency Dept.? Yes  Was the patient provided with contact information for the PCP's office or ED? Yes Was to pt encouraged to call back with questions or concerns? Yes

## 2020-10-21 ENCOUNTER — Telehealth: Payer: Self-pay

## 2020-10-21 NOTE — Telephone Encounter (Signed)
Looks like tomorrow appointment is canceled

## 2020-10-21 NOTE — Telephone Encounter (Signed)
Pt called back about in office visit/ I called office and was advised by the office that it would be ok for an in office visit as long as it is after 1.9.22, which would be more than 21 days after covid diagnoses or hospitalization / pt has rescheduled an appt for 1.10.22/

## 2020-10-21 NOTE — Telephone Encounter (Signed)
Copied from CRM [id number]. Topic: Appointment Scheduling - Scheduling Inquiry for Clinic >> Oct 20, 2020  2:34 PM Daphine Deutscher D wrote: Pt has a virtual appt on the 5th and she wants to change the appt to come into the office.  She is having a FU for covid and a rash on her legs.  CB# [phone]

## 2020-10-22 ENCOUNTER — Telehealth: Payer: 59 | Admitting: Physician Assistant

## 2020-10-27 ENCOUNTER — Telehealth (INDEPENDENT_AMBULATORY_CARE_PROVIDER_SITE_OTHER): Payer: 59 | Admitting: Physician Assistant

## 2020-10-27 ENCOUNTER — Other Ambulatory Visit: Payer: Self-pay

## 2020-10-27 VITALS — BP 152/73 | HR 96 | Temp 98.2°F | Ht 62.0 in | Wt 170.0 lb

## 2020-10-27 DIAGNOSIS — K439 Ventral hernia without obstruction or gangrene: Secondary | ICD-10-CM | POA: Diagnosis not present

## 2020-10-27 DIAGNOSIS — U071 COVID-19: Secondary | ICD-10-CM | POA: Diagnosis not present

## 2020-10-27 DIAGNOSIS — J069 Acute upper respiratory infection, unspecified: Secondary | ICD-10-CM | POA: Diagnosis not present

## 2020-10-27 MED ORDER — IPRATROPIUM-ALBUTEROL 0.5-2.5 (3) MG/3ML IN SOLN
3.0000 mL | Freq: Four times a day (QID) | RESPIRATORY_TRACT | 1 refills | Status: DC | PRN
Start: 2020-10-27 — End: 2021-03-23

## 2020-10-27 MED ORDER — HYDROCOD POLST-CPM POLST ER 10-8 MG/5ML PO SUER: 5.0000 mL | Freq: Two times a day (BID) | ORAL | 0 refills | Status: DC | PRN

## 2020-10-27 MED ORDER — SODIUM CHLORIDE 0.9 % IN NEBU: 3.0000 mL | INHALATION_SOLUTION | RESPIRATORY_TRACT | 12 refills | Status: DC | PRN

## 2020-10-27 MED ORDER — BASAGLAR KWIKPEN 100 UNIT/ML ~~LOC~~ SOPN: 10.0000 [IU] | PEN_INJECTOR | Freq: Every day | SUBCUTANEOUS | 1 refills | Status: DC

## 2020-10-27 NOTE — Progress Notes (Signed)
Established Patient Office Visit  Subjective:  Patient ID: Shari Lee, female    DOB: [birth year]  Age: 65 y.o. MRN: [id number]  CC:  Chief Complaint  Patient presents with   Follow-up    Covid    HPI Shari Lee presents for f/u following covid. Pt was diagnosed with covid on 10/05/2021. Was hospitalized from 10/05/20- 10/14/20. She reports overall she is slowly improving. Still having some cough and fatigue.  Was also started on insulin for her diabetes while in the hospital and on steroids.   Past Medical History:  Diagnosis Date   Diabetes mellitus without complication ([place])    Headache    Hypertension     Past Surgical History:  Procedure Laterality Date   ABDOMINAL HYSTERECTOMY  2006   BREAST BIOPSY Left 2005   benign   BREAST EXCISIONAL BIOPSY Left 2005?    -benign   COLONOSCOPY WITH PROPOFOL N/A 08/13/2016   Procedure: COLONOSCOPY WITH PROPOFOL;  Surgeon: Lucilla Lame, MD;  Location: [facility];  Service: Endoscopy;  Laterality: N/A;   POLYPECTOMY  08/13/2016   Procedure: POLYPECTOMY;  Surgeon: Lucilla Lame, MD;  [facility];  Service: Endoscopy;;    Family History  Problem Relation Age of Onset   Heart attack Mother    Diabetes Mother    Heart disease Mother    Heart failure Father    Breast cancer Sister    Sleep apnea Brother    Diverticulitis Brother    Diverticulitis Sister     Social History   Socioeconomic History   Marital status: Divorced    Spouse name: Not on file   Number of children: Not on file   Years of education: Not on file   Highest education level: Not on file  Occupational History   Not on file  Tobacco Use   Smoking status: Never Smoker   Smokeless tobacco: Never Used  Substance and Sexual Activity   Alcohol use: No    Alcohol/week: 0.0 standard drinks   Drug use: No   Sexual activity: Not on file  Other Topics Concern   Not on file  Social History Narrative    Not on file   Social Determinants of Health   Financial Resource Strain: Not on file  Food Insecurity: Not on file  Transportation Needs: Not on file  Physical Activity: Not on file  Stress: Not on file  Social Connections: Not on file  Intimate Partner Violence: Not on file    Outpatient Medications Prior to Visit  Medication Sig Dispense Refill   albuterol (VENTOLIN HFA) 108 (90 Base) MCG/ACT inhaler Inhale 2 puffs into the lungs every 6 (six) hours as needed for wheezing or shortness of breath. 8 g 2   amLODipine-benazepril (LOTREL) 5-20 MG capsule Take 1 capsule by mouth daily. 90 capsule 3   atenolol (TENORMIN) 50 MG tablet Take 1 tablet (50 mg total) by mouth daily. 90 tablet 3   Blood Glucose Monitoring Suppl (CONTOUR NEXT USB MONITOR) w/Device KIT 1 kit by Does not apply route daily. To check blood sugar once daily. 1 kit 0   CareTouch Safety Lancets 26G MISC 1 Device by Does not apply route daily. To check blood sugar once daily. 100 each 12   Empagliflozin-metFORMIN HCl (SYNJARDY) 5-500 MG TABS Take 1 tablet by mouth 2 (two) times daily. 60 tablet 5   glucose blood (CONTOUR NEXT TEST) test strip To check blood sugar once daily. 100 each 12   Insulin Pen  Needle (NOVOFINE PLUS) 32G X 4 MM MISC To use with ozempic injections 100 each 3   ondansetron (ZOFRAN) 4 MG tablet Take 1 tablet (4 mg total) by mouth every 8 (eight) hours as needed. 20 tablet 0   No facility-administered medications prior to visit.    Allergies  Allergen Reactions   Amoxicillin-Pot Clavulanate    Levsin  [Hyoscyamine]     Other reaction(s): Dizzyness   Penicillins    Metformin Diarrhea   Metformin And Related Diarrhea    ROS Review of Systems  Constitutional: Positive for fatigue.  HENT: Negative.   Respiratory: Positive for cough and shortness of breath. Negative for chest tightness and wheezing.        Sob when exerting herself   Cardiovascular: Negative.   Genitourinary:  Negative.   Neurological: Positive for weakness.      Objective:    Physical Exam Vitals reviewed.  Constitutional:      General: She is not in acute distress.    Appearance: Normal appearance. She is well-developed and well-nourished. She is obese. She is not ill-appearing or diaphoretic.  HENT:     Head: Normocephalic and atraumatic.  Neck:     Thyroid: No thyromegaly.     Vascular: No JVD.     Trachea: No tracheal deviation.  Cardiovascular:     Rate and Rhythm: Normal rate and regular rhythm.     Heart sounds: Murmur heard.  No friction rub. No gallop.   Pulmonary:     Effort: Pulmonary effort is normal. No respiratory distress.     Breath sounds: Decreased air movement present. Decreased breath sounds present. No wheezing or rales.  Abdominal:     Hernia: A hernia is present. Hernia is present in the ventral area.    Musculoskeletal:     Cervical back: Normal range of motion and neck supple.  Lymphadenopathy:     Cervical: No cervical adenopathy.  Neurological:     Mental Status: She is alert.     BP (!) 152/73 (BP Location: Right Arm, Patient Position: Sitting, Cuff Size: Large)    Pulse 96    Temp 98.2 F (36.8 C) (Oral)    Ht _0  (1.575 m)    Wt 170 lb (77.1 kg)    SpO2 95%    BMI 31.09 kg/m  Wt Readings from Last 3 Encounters:  10/27/20 170 lb (77.1 kg)  10/06/20 200 lb (90.7 kg)  10/05/20 184 lb 9.6 oz (83.7 kg)     Health Maintenance Due  Topic Date Due   PNEUMOCOCCAL POLYSACCHARIDE VACCINE AGE 47-64 HIGH RISK  Never done   COVID-19 Vaccine (1) Never done   MAMMOGRAM  07/19/2018   OPHTHALMOLOGY EXAM  08/01/2019   FOOT EXAM  12/16/2019    There are no preventive care reminders to display for this patient.  Lab Results  Component Value Date   TSH 2.410 10/01/2020   Lab Results  Component Value Date   WBC 11.3 (H) 10/13/2020   HGB 12.3 10/13/2020   HCT 36.9 10/13/2020   MCV 83.5 10/13/2020   PLT 388 10/13/2020   Lab Results   Component Value Date   NA 134 (L) 10/13/2020   K 3.9 10/13/2020   CO2 29 10/13/2020   GLUCOSE 86 10/13/2020   BUN 16 10/13/2020   CREATININE 0.52 10/13/2020   BILITOT 0.7 10/12/2020   ALKPHOS 43 10/12/2020   AST 38 10/12/2020   ALT 44 10/12/2020   PROT 5.3 (L) 10/12/2020  ALBUMIN 2.4 (L) 10/12/2020   CALCIUM 7.9 (L) 10/13/2020   ANIONGAP 6 10/13/2020   Lab Results  Component Value Date   CHOL 164 10/01/2020   Lab Results  Component Value Date   HDL 48 10/01/2020   Lab Results  Component Value Date   LDLCALC 99 10/01/2020   Lab Results  Component Value Date   TRIG 89 10/01/2020   Lab Results  Component Value Date   CHOLHDL 3.4 10/01/2020   Lab Results  Component Value Date   HGBA1C 13.5 (H) 10/01/2020      Assessment & Plan:   Problem List Items Addressed This Visit      Respiratory   Acute respiratory disease due to COVID-19 virus - Primary   Relevant Medications   sodium chloride 0.9 % nebulizer solution   ipratropium-albuterol (DUONEB) 0.5-2.5 (3) MG/3ML SOLN   chlorpheniramine-HYDROcodone (TUSSIONEX PENNKINETIC ER) 10-8 MG/5ML SUER    Other Visit Diagnoses    Ventral hernia without obstruction or gangrene       Relevant Orders   US Abdomen Limited      Meds ordered this encounter  Medications   sodium chloride 0.9 % nebulizer solution    Sig: Take 3 mLs by nebulization as needed for wheezing.    Dispense:  90 mL    Refill:  12    Order Specific Question:   Supervising Provider    Answer:   Virginia Crews [[id number]]   ipratropium-albuterol (DUONEB) 0.5-2.5 (3) MG/3ML SOLN    Sig: Take 3 mLs by nebulization every 6 (six) hours as needed.    Dispense:  360 mL    Refill:  1    Order Specific Question:   Supervising Provider    Answer:   Virginia Crews [[id number]]   chlorpheniramine-HYDROcodone (TUSSIONEX PENNKINETIC ER) 10-8 MG/5ML SUER    Sig: Take 5 mLs by mouth every 12 (twelve) hours as needed for cough.    Dispense:  140  mL    Refill:  0    Order Specific Question:   Supervising Provider    Answer:   Virginia Crews [[id number]]   Insulin Glargine (BASAGLAR KWIKPEN) 100 UNIT/ML    Sig: Inject 10 Units into the skin daily.    Dispense:  12 mL    Refill:  1    Order Specific Question:   Supervising Provider    Answer:   Virginia Crews [[id number]]   Patient following up recent hospitalization for acute respiratory failure. Patient does have a nebulizer and medications are cheaper than inhalers. Will give sodium chloride and duoneb as noted above for PRN use q 6 hours for SOB or wheezing.   Patient was also given Basaglar insulin pens to use 10 units SQ every night. Continue Synjardy 5-561m BID. F/U in 2-4 weeks for labs.  On exam patient has a new palpable defect in fascial wall where she is having pain with coughing. Suspect ventral hernia from intense coughing from covid 19. [place]   Follow-up: Return in about 2 weeks (around 11/10/2020).    JMar Daring PA-C

## 2020-10-28 ENCOUNTER — Encounter: Payer: Self-pay | Admitting: Physician Assistant

## 2020-10-28 NOTE — Patient Instructions (Signed)
Hernia, Adult     A hernia happens when tissue inside your body pushes out through a weak spot in your belly muscles (abdominal wall). This makes a round lump (bulge). The lump may be:  In a scar from surgery that was done in your belly (incisional hernia).  Near your belly button (umbilical hernia).  In your groin (inguinal hernia). Your groin is the area where your leg meets your lower belly (abdomen). This kind of hernia could also be: ? In your scrotum, if you are female. ? In folds of skin around your vagina, if you are female.  In your upper thigh (femoral hernia).  Inside your belly (hiatal hernia). This happens when your stomach slides above the muscle between your belly and your chest (diaphragm). If your hernia is small and it does not cause pain, you may not need treatment. If your hernia is large or it causes pain, you may need surgery. Follow these instructions at home: Activity  Avoid stretching or overusing (straining) the muscles near your hernia. Straining can happen when you: ? Lift something heavy. ? Poop (have a bowel movement).  Do not lift anything that is heavier than 10 lb (4.5 kg), or the limit that you are told, until your doctor says that it is safe.  Use the strength of your legs when you lift something heavy. Do not use only your back muscles to lift. General instructions  Do these things if told by your doctor so you do not have trouble pooping (constipation): ? Drink enough fluid to keep your pee (urine) pale yellow. ? Eat foods that are high in fiber. These include fresh fruits and vegetables, whole grains, and beans. ? Limit foods that are high in fat and processed sugars. These include foods that are fried or sweet. ? Take medicine for trouble pooping.  When you cough, try to cough gently.  You may try to push your hernia in by very gently pressing on it when you are lying down. Do not try to force the bulge back in if it will not push in  easily.  If you are overweight, work with your doctor to lose weight safely.  Do not use any products that have nicotine or tobacco in them. These include cigarettes and e-cigarettes. If you need help quitting, ask your doctor.  If you will be having surgery (hernia repair), watch your hernia for changes in shape, size, or color. Tell your doctor if you see any changes.  Take over-the-counter and prescription medicines only as told by your doctor.  Keep all follow-up visits as told by your doctor. Contact a doctor if:  You get new pain, swelling, or redness near your hernia.  You poop fewer times in a week than normal.  You have trouble pooping.  You have poop (stool) that is more dry than normal.  You have poop that is harder or larger than normal. Get help right away if:  You have a fever.  You have belly pain that gets worse.  You feel sick to your stomach (nauseous).  You throw up (vomit).  Your hernia cannot be pushed in by very gently pressing on it when you are lying down. Do not try to force the bulge back in if it will not push in easily.  Your hernia: ? Changes in shape or size. ? Changes color. ? Feels hard or it hurts when you touch it. These symptoms may represent a serious problem that is an emergency. Do not   wait to see if the symptoms will go away. Get medical help right away. Call your local emergency services (911 in the [place] Summary  A hernia happens when tissue inside your body pushes out through a weak spot in the belly muscles. This creates a bulge.  If your hernia is small and it does not hurt, you may not need treatment. If your hernia is large or it hurts, you may need surgery.  If you will be having surgery, watch your hernia for changes in shape, size, or color. Tell your doctor about any changes. This information is not intended to replace advice given to you by your health care provider. Make sure you discuss any questions you have with  your health care provider. Document Revised: 01/25/2019 Document Reviewed: 07/06/2017 Elsevier Patient Education  2021 Elsevier Inc.   Insulin Glargine injection What is this medicine? INSULIN GLARGINE (IN su lin GLAR geen) is a human-made form of insulin. This drug lowers the amount of sugar in your blood. It is a long-acting insulin that is usually given once a day. This medicine may be used for other purposes; ask your health care provider or pharmacist if you have questions. COMMON BRAND NAME(S): BASAGLAR, Lantus, Lantus SoloStar, Semglee, Toujeo Max SoloStar, [employer] What should I tell my health care provider before I take this medicine? They need to know if you have any of these conditions:  episodes of low blood sugar  eye disease, vision problems  kidney disease  liver disease  an unusual or allergic reaction to insulin, metacresol, other medicines, foods, dyes, or preservatives  pregnant or trying to get pregnant  breast-feeding How should I use this medicine? This medicine is for injection under the skin. Use this medicine at the same time each day. Use exactly as directed. This insulin should never be mixed in the same syringe with other insulins before injection. Do not vigorously shake before use. You will be taught how to use this medicine and how to adjust doses for activities and illness. Do not use more insulin than prescribed. Always check the appearance of your insulin before using it. This medicine should be clear and colorless like water. Do not use it if it is cloudy, thickened, colored, or has solid particles in it. If you use an insulin pen, be sure to take off the outer needle cover before using the dose. It is important that you put your used needles and syringes in a special sharps container. Do not put them in a trash can. If you do not have a sharps container, call your pharmacist or healthcare provider to get one. This drug comes with INSTRUCTIONS  FOR USE. Ask your pharmacist for directions on how to use this drug. Read the information carefully. Talk to your pharmacist or health care provider if you have questions. Talk to your pediatrician regarding the use of this medicine in children. While this drug may be prescribed for children as young as 6 years for selected conditions, precautions do apply. Overdosage: If you think you have taken too much of this medicine contact a poison control center or emergency room at once. NOTE: This medicine is only for you. Do not share this medicine with others. What if I miss a dose? It is important not to miss a dose. Your health care professional or doctor should discuss a plan for missed doses with you. If you do miss a dose, follow their plan. Do not take double doses. What may interact with this  medicine?  other medicines for diabetes Many medications may cause changes in blood sugar, these include:  alcohol containing beverages  antiviral medicines for HIV or AIDS  aspirin and aspirin-like drugs  certain medicines for blood pressure, heart disease, irregular heart beat  chromium  diuretics  female hormones, such as estrogens or progestins, birth control pills  fenofibrate  gemfibrozil  isoniazid  lanreotide  female hormones or anabolic steroids  MAOIs like Carbex, Eldepryl, Marplan, Nardil, and Parnate  medicines for weight loss  medicines for allergies, asthma, cold, or cough  medicines for depression, anxiety, or psychotic disturbances  niacin  nicotine  NSAIDs, medicines for pain and inflammation, like ibuprofen or naproxen  octreotide  pasireotide  pentamidine  phenytoin  probenecid  quinolone antibiotics such as ciprofloxacin, levofloxacin, ofloxacin  some herbal dietary supplements  steroid medicines such as prednisone or cortisone  sulfamethoxazole; trimethoprim  thyroid hormones Some medications can hide the warning symptoms of low blood sugar  (hypoglycemia). You may need to monitor your blood sugar more closely if you are taking one of these medications. These include:  beta-blockers, often used for high blood pressure or heart problems (examples include atenolol, metoprolol, propranolol)  clonidine  guanethidine  reserpine This list may not describe all possible interactions. Give your health care provider a list of all the medicines, herbs, non-prescription drugs, or dietary supplements you use. Also tell them if you smoke, drink alcohol, or use illegal drugs. Some items may interact with your medicine. What should I watch for while using this medicine? Visit your health care professional or doctor for regular checks on your progress. Do not drive, use machinery, or do anything that needs mental alertness until you know how this medicine affects you. Alcohol may interfere with the effect of this medicine. Avoid alcoholic drinks. A test called the HbA1C (A1C) will be monitored. This is a simple blood test. It measures your blood sugar control over the last 2 to 3 months. You will receive this test every 3 to 6 months. Learn how to check your blood sugar. Learn the symptoms of low and high blood sugar and how to manage them. Always carry a quick-source of sugar with you in case you have symptoms of low blood sugar. Examples include hard sugar candy or glucose tablets. Make sure others know that you can choke if you eat or drink when you develop serious symptoms of low blood sugar, such as seizures or unconsciousness. They must get medical help at once. Tell your doctor or health care professional if you have high blood sugar. You might need to change the dose of your medicine. If you are sick or exercising more than usual, you might need to change the dose of your medicine. Do not skip meals. Ask your doctor or health care professional if you should avoid alcohol. Many nonprescription cough and cold products contain sugar or alcohol.  These can affect blood sugar. Make sure that you have the right kind of syringe for the type of insulin you use. Try not to change the brand and type of insulin or syringe unless your health care professional or doctor tells you to. Switching insulin brand or type can cause dangerously high or low blood sugar. Always keep an extra supply of insulin, syringes, and needles on hand. Use a syringe one time only. Throw away syringe and needle in a closed container to prevent accidental needle sticks. Insulin pens and cartridges should never be shared. Even if the needle is changed, sharing  may result in passing of viruses like hepatitis or HIV. Each time you get a new box of pen needles, check to see if they are the same type as the ones you were trained to use. If not, ask your health care professional to show you how to use this new type properly. Wear a medical ID bracelet or chain, and carry a card that describes your disease and details of your medicine and dosage times. What side effects may I notice from receiving this medicine? Side effects that you should report to your doctor or health care professional as soon as possible:  allergic reactions like skin rash, itching or hives, swelling of the face, lips, or tongue  breathing problems  signs and symptoms of high blood sugar such as dizziness, dry mouth, dry skin, fruity breath, nausea, stomach pain, increased hunger or thirst, increased urination  signs and symptoms of low blood sugar such as feeling anxious, confusion, dizziness, increased hunger, unusually weak or tired, sweating, shakiness, cold, irritable, headache, blurred vision, fast heartbeat, loss of consciousness Side effects that usually do not require medical attention (report to your doctor or health care professional if they continue or are bothersome):  increase or decrease in fatty tissue under the skin due to overuse of a particular injection site  itching, burning, swelling,  or rash at site where injected This list may not describe all possible side effects. Call your doctor for medical advice about side effects. You may report side effects to FDA at 1-800-FDA-1088. Where should I keep my medicine? Keep out of the reach of children. Unopened Vials: Lantus vials: Store in a refrigerator between 2 and 8 degrees C (36 and 46 degrees F) or at room temperature below 30 degrees C (86 degrees F). Do not freeze or use if the insulin has been frozen. Protect from light and excessive heat. If stored at room temperature, the vial must be discarded after 28 days. Throw away any unopened and unused medicine that has been stored in the refrigerator after the expiration date. Unopened Pens: Barrister's clerk: Store in a refrigerator between 2 and 8 degrees C (36 and 46 degrees F) or at room temperature below 30 degrees C (86 degrees F). Do not freeze or use if the insulin has been frozen. Protect from light and excessive heat. If stored at room temperature, the pen must be discarded after 28 days. Throw away any unopened and unused medicine that has been stored in the refrigerator after the expiration date. Lantus Solostar Pens: Store in a refrigerator between 2 and 8 degrees C (36 and 46 degrees F) or at room temperature below 30 degrees C (86 degrees F). Do not freeze or use if the insulin has been frozen. Protect from light and excessive heat. If stored at room temperature, the pen must be discarded after 28 days. Throw away any unopened and unused medicine that has been stored in the refrigerator after the expiration date. Semglee Pens: Store in a refrigerator between 2 and 8 degrees C (36 and 46 degrees F) or at room temperature below 30 degrees C (86 degrees F). Do not freeze or use if the insulin has been frozen. Protect from light and excessive heat. If stored at room temperature, the pen must be discarded after 28 days. Throw away any unopened and unused medicine that has been stored  in the refrigerator after the expiration date. Toujeo Solostar Pens or Toujeo Max [employer] Pens: Store in a refrigerator between 2 and 8 degrees  C (36 and 46 degrees F). Do not freeze or use if the insulin has been frozen. Protect from light and excessive heat. Throw away any unopened and unused medicine that has been stored in the refrigerator after the expiration date. Vials that you are using: Lantus vials: Store in a refrigerator or at room temperature below 30 degrees C (86 degrees F). Do not freeze. Keep away from heat and light. Throw the opened vial away after 28 days. Semglee vials: Store in a refrigerator or at room temperature below 30 degrees C (86 degrees F). Do not freeze. Keep away from heat and light. Throw the opened vial away after 28 days. Pens that you are using: Basaglar KwikPens: Store at room temperature below 30 degrees C (86 degrees F). Do not refrigerate or freeze. Keep away from heat and light. Throw the pen away after 28 days, even if it still has insulin left in it. Lantus Solostar Pens: Store at room temperature below 30 degrees C (86 degrees F). Do not refrigerate or freeze. Keep away from heat and light. Throw the pen away after 28 days, even if it still has insulin left in it. Semglee Pens: Store at room temperature below 30 degrees C (86 degrees F). Do not refrigerate or freeze. Keep away from heat and light. Throw the pen away after 28 days, even if it still has insulin left in it. Toujeo Solostar Pens or Toujeo Max [employer] Pens: Store at room temperature below 30 degrees C (86 degrees F). Do not refrigerate or freeze. Keep away from heat and light. Throw the pen away after 56 days, even if it still has insulin left in it. NOTE: This sheet is a summary. It may not cover all possible information. If you have questions about this medicine, talk to your doctor, pharmacist, or health care provider.  2021 Elsevier/Gold Standard (2019-07-25 15:00:23)

## 2020-11-03 ENCOUNTER — Ambulatory Visit: Payer: 59

## 2020-11-10 ENCOUNTER — Other Ambulatory Visit: Payer: Self-pay

## 2020-11-10 ENCOUNTER — Encounter: Payer: Self-pay | Admitting: Physician Assistant

## 2020-11-10 ENCOUNTER — Ambulatory Visit: Payer: 59 | Admitting: Physician Assistant

## 2020-11-10 VITALS — BP 160/98 | HR 84 | Temp 98.3°F | Wt 182.0 lb

## 2020-11-10 DIAGNOSIS — I10 Essential (primary) hypertension: Secondary | ICD-10-CM

## 2020-11-10 DIAGNOSIS — J069 Acute upper respiratory infection, unspecified: Secondary | ICD-10-CM

## 2020-11-10 DIAGNOSIS — U071 COVID-19: Secondary | ICD-10-CM | POA: Diagnosis not present

## 2020-11-10 DIAGNOSIS — E1165 Type 2 diabetes mellitus with hyperglycemia: Secondary | ICD-10-CM

## 2020-11-10 MED ORDER — GLYBURIDE 2.5 MG PO TABS: 2.5000 mg | ORAL_TABLET | Freq: Every day | ORAL | 1 refills | Status: DC

## 2020-11-10 MED ORDER — DAPAGLIFLOZIN PROPANEDIOL 10 MG PO TABS: 10.0000 mg | ORAL_TABLET | Freq: Every day | ORAL | 1 refills | Status: DC

## 2020-11-10 NOTE — Progress Notes (Signed)
Established patient visit   Patient: Shari Lee   DOB: 1956/02/23   65 y.o. Female  MRN: [id number] Visit Date: 11/10/2020  Today's healthcare provider: Mar Daring, PA-C   Chief Complaint  Patient presents with   Follow-up   Covid Positive   Subjective    HPI  Follow up for Covid  The patient was last seen for this 2 weeks ago. Changes made at last visit include no changes.  She reports excellent compliance with treatment. She feels that condition is Improved.   ----------------------------------------------------------------------------------------- Diabetes Mellitus Type II, Follow-up  Lab Results  Component Value Date   HGBA1C 13.5 (H) 10/01/2020   HGBA1C 11.2 (A) 01/31/2019   HGBA1C 14.0 (A) 12/27/2018   Wt Readings from Last 3 Encounters:  11/10/20 182 lb (82.6 kg)  10/27/20 170 lb (77.1 kg)  10/06/20 200 lb (90.7 kg)   Last seen for diabetes 6 weeks ago.  Management since then includes starting Synjardy. She reports poor compliance with treatment. Pt states she could like afford Synjardy, so she started back on Glyburide 2.26m twice a day.  Pt also started Basaglar 10 Units daily since being in the hospital for Covid. She is not having side effects.  Symptoms: No fatigue No foot ulcerations  No appetite changes No nausea  No paresthesia of the feet  No polydipsia  No polyuria No visual disturbances   No vomiting     Home blood sugar records: fasting range: 200-300  Episodes of hypoglycemia? No    Current insulin regiment: basaglar 10 units nightly Most Recent Eye Exam: Pt is overdue for an eye exam. Current exercise: Exercises twice a week. Current diet habits: in general, an "unhealthy" diet  Pertinent Labs: Lab Results  Component Value Date   CHOL 164 10/01/2020   HDL 48 10/01/2020   LDLCALC 99 10/01/2020   TRIG 89 10/01/2020   CHOLHDL 3.4 10/01/2020   Lab Results  Component Value Date   NA 134 (L) 10/13/2020   K 3.9  10/13/2020   CREATININE 0.52 10/13/2020   GFRNONAA >60 10/13/2020   GFRAA 60 10/01/2020   GLUCOSE 86 10/13/2020     ---------------------------------------------------------------------------------------------------    Patient Active Problem List   Diagnosis Date Noted   Acute respiratory disease due to COVID-19 virus 10/06/2020   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   Diabetes mellitus type 2, uncontrolled ([facility] 04/29/2015   History of digestive disease 04/29/2015   Osteochondritis of tibial tuberosity 04/29/2015   Hypercholesterolemia without hypertriglyceridemia 12/11/2009   Apnea, sleep 07/30/2008   Colon, diverticulosis 06/07/2008   Essential (primary) hypertension 04/25/2006   Past Medical History:  Diagnosis Date   Diabetes mellitus without complication ([facility]    Headache    Hypertension    Social History   Tobacco Use   Smoking status: Never Smoker   Smokeless tobacco: Never Used  Substance Use Topics   Alcohol use: No    Alcohol/week: 0.0 standard drinks   Drug use: No   Allergies  Allergen Reactions   Amoxicillin-Pot Clavulanate    Levsin  [Hyoscyamine]     Other reaction(s): Dizzyness   Penicillins    Metformin Diarrhea   Metformin And Related Diarrhea     Medications: Outpatient Medications Prior to Visit  Medication Sig   albuterol (VENTOLIN HFA) 108 (90 Base) MCG/ACT inhaler Inhale 2 puffs into the lungs every 6 (six) hours as needed for wheezing or shortness of breath.   amLODipine-benazepril (LOTREL) 5-20 MG capsule Take 1 capsule  by mouth daily.   atenolol (TENORMIN) 50 MG tablet Take 1 tablet (50 mg total) by mouth daily.   Blood Glucose Monitoring Suppl (CONTOUR NEXT USB MONITOR) w/Device KIT 1 kit by Does not apply route daily. To check blood sugar once daily.   CareTouch Safety Lancets 26G MISC 1 Device by Does not apply route daily. To check blood sugar once daily.   glucose blood (CONTOUR NEXT TEST) test strip To  check blood sugar once daily.   Insulin Glargine (BASAGLAR KWIKPEN) 100 UNIT/ML Inject 10 Units into the skin daily.   Insulin Pen Needle (NOVOFINE PLUS) 32G X 4 MM MISC To use with ozempic injections   ondansetron (ZOFRAN) 4 MG tablet Take 1 tablet (4 mg total) by mouth every 8 (eight) hours as needed.   sodium chloride 0.9 % nebulizer solution Take 3 mLs by nebulization as needed for wheezing.   ipratropium-albuterol (DUONEB) 0.5-2.5 (3) MG/3ML SOLN Take 3 mLs by nebulization every 6 (six) hours as needed. (Patient not taking: Reported on 11/10/2020)   [DISCONTINUED] chlorpheniramine-HYDROcodone (TUSSIONEX PENNKINETIC ER) 10-8 MG/5ML SUER Take 5 mLs by mouth every 12 (twelve) hours as needed for cough. (Patient not taking: Reported on 11/10/2020)   [DISCONTINUED] Empagliflozin-metFORMIN HCl (SYNJARDY) 5-500 MG TABS Take 1 tablet by mouth 2 (two) times daily. (Patient not taking: Reported on 11/10/2020)   No facility-administered medications prior to visit.    Review of Systems  Constitutional: Positive for fatigue. Negative for activity change, appetite change, chills, diaphoresis, fever and unexpected weight change.  HENT: Negative.   Eyes: Negative.   Respiratory: Positive for cough, shortness of breath and wheezing. Negative for apnea, choking, chest tightness and stridor.        Pt reports this has improved some.   Cardiovascular: Negative.   Gastrointestinal: Negative.   Musculoskeletal: Negative for myalgias.  Neurological: Negative for dizziness, light-headedness and headaches.       Objective    BP (!) 160/98 (BP Location: Left Arm, Patient Position: Sitting, Cuff Size: Normal)    Pulse 84    Temp 98.3 F (36.8 C) (Oral)    Wt 182 lb (82.6 kg)    BMI 33.29 kg/m     Physical Exam Vitals reviewed.  Constitutional:      General: She is not in acute distress.    Appearance: Normal appearance. She is well-developed and well-nourished. She is obese. She is not  ill-appearing or diaphoretic.  Cardiovascular:     Rate and Rhythm: Normal rate and regular rhythm.     Heart sounds: Normal heart sounds. No murmur heard. No friction rub. No gallop.   Pulmonary:     Effort: Pulmonary effort is normal. No respiratory distress.     Breath sounds: Normal breath sounds. No wheezing or rales.  Musculoskeletal:     Cervical back: Normal range of motion and neck supple.  Neurological:     Mental Status: She is alert.      No results found for any visits on 11/10/20.  Assessment & Plan     1. Acute respiratory disease due to COVID-19 virus Improved. No bothersome symptoms.   2. Essential (primary) hypertension Elevated today. Patient wants to check BP at home and see if >140/90. If so, we will increase amlodipine-benazepril to 10-37m (taking two tabs of what she already has of the 5-2715mcapsules). F/U in 1 month.   3. Uncontrolled type 2 diabetes mellitus with hyperglycemia (HCCircleSynjardy no longer covered by insurance. Continue Glyberide 2.15m1mID. Add  Farxiga 44m as below. Patient has diarrhea with metformin. Continue Basaglar 10 units at bedtime. Patient having issues affording any insulin. Under her plan insulin is all $200/month and she cannot afford this. Referral placed to [facility] pharmacist for medication assistance. F/U in 1 month for A1c. - dapagliflozin propanediol (FARXIGA) 10 MG TABS tablet; Take 1 tablet (10 mg total) by mouth daily before breakfast.  Dispense: 90 tablet; Refill: 1 - glyBURIDE (DIABETA) 2.5 MG tablet; Take 1 tablet (2.5 mg total) by mouth daily with breakfast.  Dispense: 180 tablet; Refill: 1 - AMB Referral to [facility] No follow-ups on file.      IReynolds Bowl PA-C, have reviewed all documentation for this visit. The documentation on 11/11/20 for the exam, diagnosis, procedures, and orders are all accurate and complete.   JRubye Beach [phone] (phone) [phone](fax)  CD[place]

## 2020-11-11 ENCOUNTER — Encounter: Payer: Self-pay | Admitting: Physician Assistant

## 2020-11-11 ENCOUNTER — Telehealth: Payer: Self-pay | Admitting: *Deleted

## 2020-11-11 NOTE — Chronic Care Management (AMB) (Signed)
  Care Management   Note  11/11/2020 Name: Shari Lee MRN: [id number] DOB: Aug 10, 1956  Shari Lee is a 65 y.o. year old female who is a primary care patient of Rubye Beach. I reached out to Guss Bunde by phone today in response to a referral sent by Ms. Payal Kozel's PCP,  Mar Daring, PA-C.   [name] was given information about care management services today including:  1. Care management services include personalized support from designated clinical staff supervised by her physician, including individualized plan of care and coordination with other care providers 2. 24/7 contact phone numbers for assistance for urgent and routine care needs. 3. The patient may stop care management services at any time by phone call to the office staff.  Patient agreed to services and verbal consent obtained.   Follow up plan: Telephone appointment with care management team member scheduled for: 11/21/2020  [place] [facility]

## 2020-11-12 ENCOUNTER — Ambulatory Visit
Admission: RE | Admit: 2020-11-12 | Discharge: 2020-11-12 | Disposition: A | Discharge status: Home / Self Care | Payer: 59 | Source: Ambulatory Visit | Attending: Physician Assistant | Admitting: Physician Assistant

## 2020-11-12 ENCOUNTER — Other Ambulatory Visit: Payer: Self-pay

## 2020-11-12 DIAGNOSIS — K439 Ventral hernia without obstruction or gangrene: Secondary | ICD-10-CM | POA: Diagnosis not present

## 2020-11-14 ENCOUNTER — Telehealth: Payer: Self-pay

## 2020-11-17 ENCOUNTER — Telehealth: Payer: Self-pay

## 2020-11-17 NOTE — Telephone Encounter (Signed)
The area of concern is not a hernia. It appears to be a small area of edema (swelling) in the anterior abdominal wall. This is most likely inflammation from muscle strain from coughing so much. May apply heating pad to the area. Apply for 20 minutes at a time and may repeat 4-5 times per day. Also always have clothing or towel between heating pad and skin. Call if not improving over the next 4 weeks or if symptoms worsen at anytime.

## 2020-11-17 NOTE — Progress Notes (Signed)
Chronic Care Management Pharmacy Assistant   Name: Jacquelyne Quarry  MRN: [id number] DOB: 1955-12-21  Reason for Encounter: Medication Review/Initial question for initial visit with clinical pharmacist on 11/21/2020  Patient Questions:  1.  Have you seen any other providers since your last visit? No  2.  Any changes in your medicines or health? No   PCP : Mar Daring, PA-C  Allergies:   Allergies  Allergen Reactions  . Amoxicillin-Pot Clavulanate   . Levsin  [Hyoscyamine]     Other reaction(s): Dizzyness  . Penicillins   . Metformin Diarrhea  . Metformin And Related Diarrhea    Medications: Outpatient Encounter Medications as of 11/17/2020  Medication Sig  . albuterol (VENTOLIN HFA) 108 (90 Base) MCG/ACT inhaler Inhale 2 puffs into the lungs every 6 (six) hours as needed for wheezing or shortness of breath.  Marland Kitchen amLODipine-benazepril (LOTREL) 5-20 MG capsule Take 1 capsule by mouth daily.  Marland Kitchen atenolol (TENORMIN) 50 MG tablet Take 1 tablet (50 mg total) by mouth daily.  . Blood Glucose Monitoring Suppl (CONTOUR NEXT USB MONITOR) w/Device KIT 1 kit by Does not apply route daily. To check blood sugar once daily.  Angelia Mould Safety Lancets 26G MISC 1 Device by Does not apply route daily. To check blood sugar once daily.  . dapagliflozin propanediol (FARXIGA) 10 MG TABS tablet Take 1 tablet (10 mg total) by mouth daily before breakfast.  . glucose blood (CONTOUR NEXT TEST) test strip To check blood sugar once daily.  Marland Kitchen glyBURIDE (DIABETA) 2.5 MG tablet Take 1 tablet (2.5 mg total) by mouth daily with breakfast.  . Insulin Glargine (BASAGLAR KWIKPEN) 100 UNIT/ML Inject 10 Units into the skin daily.  . Insulin Pen Needle (NOVOFINE PLUS) 32G X 4 MM MISC To use with ozempic injections  . ipratropium-albuterol (DUONEB) 0.5-2.5 (3) MG/3ML SOLN Take 3 mLs by nebulization every 6 (six) hours as needed. (Patient not taking: Reported on 11/10/2020)  . ondansetron (ZOFRAN) 4 MG tablet  Take 1 tablet (4 mg total) by mouth every 8 (eight) hours as needed.  . sodium chloride 0.9 % nebulizer solution Take 3 mLs by nebulization as needed for wheezing.   No facility-administered encounter medications on file as of 11/17/2020.    Current Diagnosis: Patient Active Problem List   Diagnosis Date Noted  . Acute respiratory disease due to COVID-19 virus 10/06/2020  . 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 Diabetes mellitus type 2, uncontrolled ([place]) 04/29/2015  . History of digestive disease 04/29/2015  . 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 Hypercholesterolemia without hypertriglyceridemia 12/11/2009  . Apnea, sleep 07/30/2008  . Colon, diverticulosis 06/07/2008  . Essential (primary) hypertension 04/25/2006    Goals Addressed   None    Have you seen any other providers since your last visit? no Any changes in your medications or health? no Any side effects from any medications? no Do you have an symptoms or problems not managed by your medications? no Any concerns about your health right now? Yes  Patient states she is concern about her blood sugar being high.  Patient states her eye sight has improved since she started the insulin (patient states she had blurry vision). Has your provider asked that you check blood pressure, blood sugar, or follow special diet at home? Yes  Patient states her blood sugar has been ranging around 200, the  lowest has been 190 after she started insulin.Patient reports her blood sugar was 400 before insulin.   Patient states her blood  pressure has been ranging around 179/80, denies headaches, dizziness and lightheadedness.  Patient reports she cooks at home using little salt for taste.  Patient states she does not eat a lot of vegetables.  Do you get any type of exercise on a regular basis? Yes  Patient reports she exercise twice a week , strecthing with a two pound weight.  Can you think of a goal you would like to reach for your  health? None ID Do you have any problems getting your medications? Yes  Patient states she has a hard time affording her medication. Is there anything that you would like to discuss during the appointment? Yes  Patient states she would like to discuss her diabetes, blood pressure and patient assistance for her medication.  Please bring medications and supplements to appointment  Follow-Up:  Pharmacist Review   Bessie Somerville Pharmacist Assistant [phone]

## 2020-11-17 NOTE — Telephone Encounter (Signed)
Please advise 

## 2020-11-17 NOTE — Telephone Encounter (Signed)
Copied from [place] [id number]. Topic: General - Other >> Nov 14, 2020  3:02 PM Shari Lee wrote: PT need a callback from Mar Daring, to go over her Ultrasound results / please advise

## 2020-11-18 NOTE — Telephone Encounter (Signed)
Pt advised of U/S results.   Thanks,   -Ziana Heyliger  

## 2020-11-19 ENCOUNTER — Telehealth: Payer: Self-pay

## 2020-11-19 DIAGNOSIS — E1165 Type 2 diabetes mellitus with hyperglycemia: Secondary | ICD-10-CM

## 2020-11-19 MED ORDER — CONTOUR NEXT TEST VI STRP: ORAL_STRIP | 5 refills | Status: AC

## 2020-11-19 NOTE — Telephone Encounter (Signed)
Patient needs refills on Test strips for Contour Next sent  to [facility] on [address].

## 2020-11-21 ENCOUNTER — Ambulatory Visit: Payer: 59

## 2020-11-21 DIAGNOSIS — E785 Hyperlipidemia, unspecified: Secondary | ICD-10-CM

## 2020-11-21 DIAGNOSIS — E1169 Type 2 diabetes mellitus with other specified complication: Secondary | ICD-10-CM

## 2020-11-21 DIAGNOSIS — E1165 Type 2 diabetes mellitus with hyperglycemia: Secondary | ICD-10-CM

## 2020-11-21 NOTE — Progress Notes (Signed)
Chronic Care Management Pharmacy Note  11/24/2020 Name:  Shari Lee MRN:  [id number] DOB:  25-Aug-1956  Subjective: Shari Lee is an 65 y.o. year old female who is a primary patient of Mar Daring, [place].  The CCM team was consulted for assistance with disease management and care coordination needs.    Engaged with patient by telephone for initial visit in response to provider referral for pharmacy case management and/or care coordination services.   Consent to Services:  The patient was given the following information about Chronic Care Management services today, agreed to services, and gave verbal consent: 1. CCM service includes personalized support from designated clinical staff supervised by the primary care provider, including individualized plan of care and coordination with other care providers 2. 24/7 contact phone numbers for assistance for urgent and routine care needs. 3. Service will only be billed when office clinical staff spend 20 minutes or more in a month to coordinate care. 4. Only one practitioner may furnish and bill the service in a calendar month. 5.The patient may stop CCM services at any time (effective at the end of the month) by phone call to the office staff. 6. The patient will be responsible for cost sharing (co-pay) of up to 20% of the service fee (after annual deductible is met). Patient agreed to services and consent obtained.  Patient Care Team: Rubye Beach as PCP - General (Family Medicine) Germaine Pomfret, [facility] (Pharmacist)  Recent office visits: 11/10/20: Patient presented to Fenton Malling, PA-C for follow-up. BP in clinic 160/98. Synjardy stopped (Cost), glyburide and [name] started. Diarrhea with metformin  10/27/20: Video visit with Fenton Malling, PA-C for respiratory disease following Covid-19 infection. Patient started on Basaglar 10 units daily, Tussionex, DuoNeb, and NaCl Neb.   Recent consult visits: 10/06/20:  Patient hospitalized for Covid-19 infection.   Objective:  Lab Results  Component Value Date   CREATININE 0.52 10/13/2020   BUN 16 10/13/2020   GFRNONAA >60 10/13/2020   GFRAA 60 10/01/2020   NA 134 (L) 10/13/2020   K 3.9 10/13/2020   CALCIUM 7.9 (L) 10/13/2020   CO2 29 10/13/2020    Lab Results  Component Value Date/Time   HGBA1C 13.5 (H) 10/01/2020 02:35 PM   HGBA1C 11.2 (A) 01/31/2019 10:03 AM   HGBA1C 14.0 (A) 12/27/2018 07:19 AM   HGBA1C 13.7 (H) 12/15/2018 08:05 AM   MICROALBUR negative 12/15/2018 09:10 AM    Last diabetic Eye exam:  Lab Results  Component Value Date/Time   HMDIABEYEEXA No Retinopathy 07/31/2018 12:00 AM    Last diabetic Foot exam: No results found for: HMDIABFOOTEX   Lab Results  Component Value Date   CHOL 164 10/01/2020   HDL 48 10/01/2020   LDLCALC 99 10/01/2020   TRIG 89 10/01/2020   CHOLHDL 3.4 10/01/2020    Hepatic Function Latest Ref Rng & Units 10/12/2020 10/11/2020 10/10/2020  Total Protein 6.5 - 8.1 g/dL 5.3(L) 5.2(L) 5.9(L)  Albumin 3.5 - 5.0 g/dL 2.4(L) 2.4(L) 2.6(L)  AST 15 - 41 U/L 38 46(H) 47(H)  ALT 0 - 44 U/L 44 44 35  Alk Phosphatase 38 - 126 U/L 43 51 59  Total Bilirubin 0.3 - 1.2 mg/dL 0.7 0.7 0.9    Lab Results  Component Value Date/Time   TSH 2.410 10/01/2020 02:35 PM   TSH 2.090 12/15/2018 08:05 AM    CBC Latest Ref Rng & Units 10/13/2020 10/12/2020 10/11/2020  WBC 4.0 - 10.5 K/uL 11.3(H) 18.2(H) 15.9(H)  Hemoglobin 12.0 -  15.0 g/dL 12.3 12.3 12.0  Hematocrit 36.0 - 46.0 % 36.9 35.4(L) 35.8(L)  Platelets 150 - 400 K/uL 388 417(H) 410(H)    No results found for: VD25OH  Clinical ASCVD: No  The 10-year ASCVD risk score Mikey Bussing DC Jr., et al., 2013) is: 16.4%   Values used to calculate the score:     Age: 38 years     Sex: Female     Is Non-Hispanic African American: No     Diabetic: Yes     Tobacco smoker: No     Systolic Blood Pressure: 034 mmHg     Is BP treated: Yes     HDL Cholesterol: 48 mg/dL      Total Cholesterol: 164 mg/dL    Depression screen [facility] 2/9 10/27/2020 10/01/2020 01/03/2020  Decreased Interest 0 0 0  Down, Depressed, Hopeless 0 0 0  PHQ - 2 Score 0 0 0  Altered sleeping 0 - -  Tired, decreased energy 0 - -  Change in appetite 0 - -  Feeling bad or failure about yourself  0 - -  Trouble concentrating 0 - -  Moving slowly or fidgety/restless 0 - -  Suicidal thoughts 0 - -  PHQ-9 Score 0 - -  Difficult doing work/chores Not difficult at all - -     Social History   Tobacco Use  Smoking Status Never Smoker  Smokeless Tobacco Never Used   BP Readings from Last 3 Encounters:  11/10/20 (!) 160/98  10/27/20 (!) 152/73  10/14/20 138/78   Pulse Readings from Last 3 Encounters:  11/10/20 84  10/27/20 96  10/14/20 63   Wt Readings from Last 3 Encounters:  11/10/20 182 lb (82.6 kg)  10/27/20 170 lb (77.1 kg)  10/06/20 200 lb (90.7 kg)    Assessment/Interventions: Review of patient past medical history, allergies, medications, health status, including review of consultants reports, laboratory and other test data, was performed as part of comprehensive evaluation and provision of chronic care management services.   SDOH:  (Social Determinants of Health) assessments and interventions performed: SDOH Interventions   Flowsheet Row Most Recent Value  SDOH Interventions   Financial Strain Interventions Other (Comment)  [Patient Assistance Started]  Transportation Interventions Intervention Not Indicated      CCM Care Plan  Allergies  Allergen Reactions  . Amoxicillin-Pot Clavulanate   . Levsin  [Hyoscyamine]     Other reaction(s): Dizzyness  . Penicillins   . Metformin Diarrhea  . Metformin And Related Diarrhea    Medications Reviewed Today    Reviewed by Rubye Beach (Physician Assistant Certified) on 74/25/95 at 1722  Med List Status: <None>  Medication Order Taking? Sig Documenting Provider Last Dose Status Informant  albuterol  (VENTOLIN HFA) 108 (90 Base) MCG/ACT inhaler [id number] Yes Inhale 2 puffs into the lungs every 6 (six) hours as needed for wheezing or shortness of breath. Nance Pear, MD Taking Active   amLODipine-benazepril (LOTREL) 5-20 MG capsule [id number] Yes Take 1 capsule by mouth daily. Mar Daring, PA-C Taking Active   atenolol (TENORMIN) 50 MG tablet [id number] Yes Take 1 tablet (50 mg total) by mouth daily. Mar Daring, PA-C Taking Active   Blood Glucose Monitoring Suppl (CONTOUR NEXT USB MONITOR) w/Device KIT [id number] Yes 1 kit by Does not apply route daily. To check blood sugar once daily. Mar Daring, PA-C Taking Active   CareTouch Safety Lancets 26G MISC [id number] Yes 1 Device by Does not apply route daily. To check  blood sugar once daily. Mar Daring, PA-C Taking Active   dapagliflozin propanediol (FARXIGA) 10 MG TABS tablet [id number] Yes Take 1 tablet (10 mg total) by mouth daily before breakfast. Mar Daring, PA-C  Active   glucose blood (CONTOUR NEXT TEST) test strip [id number] Yes To check blood sugar once daily. Mar Daring, PA-C Taking Active   glyBURIDE (DIABETA) 2.5 MG tablet [id number] Yes Take 1 tablet (2.5 mg total) by mouth daily with breakfast. Mar Daring, PA-C  Active   Insulin Glargine [facility] KWIKPEN) 100 UNIT/ML [id number] Yes Inject 10 Units into the skin daily. Mar Daring, PA-C Taking Active   Insulin Pen Needle (NOVOFINE PLUS) 32G X 4 MM MISC [id number] Yes To use with ozempic injections Fenton Malling M, PA-C Taking Active   ipratropium-albuterol (DUONEB) 0.5-2.5 (3) MG/3ML SOLN [id number] No Take 3 mLs by nebulization every 6 (six) hours as needed.  Patient not taking: Reported on 11/10/2020   Mar Daring, PA-C Not Taking Active   ondansetron [facility]) 4 MG tablet [id number] Yes Take 1 tablet (4 mg total) by mouth every 8 (eight) hours as needed. Nance Pear, MD Taking Active   sodium  chloride 0.9 % nebulizer solution [id number] Yes Take 3 mLs by nebulization as needed for wheezing. Mar Daring, PA-C Taking Active           Patient Active Problem List   Diagnosis Date Noted  . Acute respiratory disease due to COVID-19 virus 10/06/2020  . 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 Diabetes mellitus type 2, uncontrolled ([place]) 04/29/2015  . History of digestive disease 04/29/2015  . 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 Hypercholesterolemia without hypertriglyceridemia 12/11/2009  . Apnea, sleep 07/30/2008  . Colon, diverticulosis 06/07/2008  . Essential (primary) hypertension 04/25/2006    Immunization History  Administered Date(s) Administered  . Influenza Inj Mdck Quad Pf 07/30/2019  . Influenza Inj Mdck Quad With Preservative 08/27/2018  . Influenza,inj,Quad PF,6+ Mos 07/28/2015  . Pneumococcal Conjugate-13 08/27/2018  . Td 08/26/2011  . Tdap 08/26/2011    Conditions to be addressed/monitored:  HTN, HLD and DMII  Care Plan : General Pharmacy (Adult)  Updates made by Germaine Pomfret, RPH since 11/24/2020 12:00 AM    Problem: Hypertension, Hyperlipidemia, Diabetes   Priority: High    Long-Range Goal: Patient-Specific Goal   Start Date: 11/21/2020  Expected End Date: 12/19/2020  This Visit's Progress: On track  Priority: High  Note:   Current Barriers:  . Unable to independently afford treatment regimen . Unable to achieve control of Diabetes    Pharmacist Clinical Goal(s):  Marland Kitchen Over the next 90 days, patient will achieve control of diabetes as evidenced by A1c <8% through collaboration with PharmD and provider.   Interventions: . 1:1 collaboration with Mar Daring, PA-C regarding development and update of comprehensive plan of care as evidenced by provider attestation and co-signature . Inter-disciplinary care team collaboration (see longitudinal plan of care) . Comprehensive medication review performed; medication list updated  in electronic medical record  Hypertension (BP goal <130/80) -controlled -Current treatment: . Amlodipine-benazepril 5-20 mg daily (HS)  . Atenolol 50 mg daily (HS)  -Medications previously tried: NA  -Current home readings:  2/2: 120/67, Pulse 68  2/3 137/74, Pulse 67  2/4 111/61, Pulse 72   -Current dietary habits: None  -Current exercise habits: Health visitor twice weekly: weights, resistance bands, walking 30 minutes. Limited by dyspnea following Covid infection in December.   -Denies hypotensive/hypertensive symptoms -Educated on  BP goals and benefits of medications for prevention of heart attack, stroke and kidney damage; Daily salt intake goal < 2300 mg; Exercise goal of 150 minutes per week; Importance of home blood pressure monitoring; -Counseled to monitor BP at home weekly, document, and provide log at future appointments -Recommended to continue current medication  Hyperlipidemia: (LDL goal < 70) -uncontrolled -Current treatment: . None -Medications previously tried: NA  -Educated on Benefits of statin for ASCVD risk reduction; Importance of limiting foods high in cholesterol; -Recommended starting Rosuvastatin 5 mg daily   Diabetes (A1c goal <8%)  Diagnosed 2005  -uncontrolled -Current medications: . Farxiga 10 mg daily [employer])  . Glyburide 2.5 mg daily (taking twice daily)  . Basaglar 10 units daily  -Medications previously tried: Metformin (Diarrhea), Jardiance, Ozempic (ineffective)   -Current home glucose readings . fasting glucose: 180-190  . post prandial glucose: NA -Denies hypoglycemic symptoms, but does report polydipsia, polyuria  -Educated on A1c and blood sugar goals; Complications of diabetes including kidney damage, retinal damage, and cardiovascular disease; Exercise goal of 150 minutes per week; -Counseled to check feet daily and get yearly eye exams -Recommended increasing Basaglar 2 units every 3 days until fasting sugars  are ranging 100-150 mg/dL Assessed patient finances. Patient Assistance for [place] started.   Patient Goals/Self-Care Activities . Over the next 90 days, patient will:  - check glucose daily, document, and provide at future appointments check blood pressure weekly, document, and provide at future appointments target a minimum of 150 minutes of moderate intensity exercise weekly engage in dietary modifications by Minimizing sodium, carbohydrate, and fat intake.   Follow Up Plan: Telephone follow up appointment with care management team member scheduled for: 01/02/21 at 1:00 PM       Medication Assistance: Application for Basaglar  medication assistance program. in process.  Anticipated assistance start date 11/26/20.  See plan of care for additional detail.  Patient's preferred pharmacy is:  [facility] [address] ([address]), [place] - [address] [place]) [place] [address] Phone: [phone] Fax: [phone]  Uses pill box? Yes Pt endorses 95% compliance  We discussed: Patient interested in [facility] services, but unsure if preferred under insurance plan.   Patient decided to: Contact insurance to see if [facility] preferred under her plan.   Follow Up:  Patient agrees to Care Plan and Follow-up.  Plan: Telephone follow up appointment with care management team member scheduled for:  01/02/21 at 1:00 PM  [place] [phone]

## 2020-11-24 ENCOUNTER — Other Ambulatory Visit: Payer: Self-pay | Admitting: Physician Assistant

## 2020-11-24 MED ORDER — ROSUVASTATIN CALCIUM 5 MG PO TABS: 5.0000 mg | ORAL_TABLET | Freq: Every day | ORAL | 3 refills | Status: DC

## 2020-11-24 NOTE — Patient Instructions (Signed)
Visit Information It was great speaking with you today!  Please let me know if you have any questions about our visit.  Goals Addressed            This Visit's Progress   . Monitor and Manage My Blood Sugar-Diabetes Type 2       Timeframe:  Long-Range Goal Priority:  High Start Date: 11/21/2020                            Expected End Date: 12/19/2020                      Follow Up Date 01/02/2021    - check blood sugar at prescribed times - check blood sugar if I feel it is too high or too low - enter blood sugar readings and medication or insulin into daily log - take the blood sugar log to all doctor visits    Why is this important?    Checking your blood sugar at home helps to keep it from getting very high or very low.   Writing the results in a diary or log helps the doctor know how to care for you.   Your blood sugar log should have the time, date and the results.   Also, write down the amount of insulin or other medicine that you take.   Other information, like what you ate, exercise done and how you were feeling, will also be helpful.         Ms. Shupert was given information about Chronic Care Management services today including:  1. CCM service includes personalized support from designated clinical staff supervised by her physician, including individualized plan of care and coordination with other care providers 2. 24/7 contact phone numbers for assistance for urgent and routine care needs. 3. Standard insurance, coinsurance, copays and deductibles apply for chronic care management only during months in which we provide at least 20 minutes of these services. Most insurances cover these services at 100%, however patients may be responsible for any copay, coinsurance and/or deductible if applicable. This service may help you avoid the need for more expensive face-to-face services. 4. Only one practitioner may furnish and bill the service in a calendar month. 5. The  patient may stop CCM services at any time (effective at the end of the month) by phone call to the office staff.  Patient agreed to services and verbal consent obtained.   The patient verbalized understanding of instructions, educational materials, and care plan provided today and declined offer to receive copy of patient instructions, educational materials, and care plan.  Telephone follow up appointment with pharmacy team member scheduled for: 01/02/21 at 1:00 PM   [place] [phone]

## 2020-11-24 NOTE — Progress Notes (Signed)
crestor sent in 

## 2020-11-27 ENCOUNTER — Telehealth: Payer: Self-pay

## 2020-11-27 NOTE — Progress Notes (Signed)
    Chronic Care Management Pharmacy Assistant   Name: Nuriyah Hanline  MRN: [id number] DOB: 06/10/1956  Reason for Encounter: Medication Review  PCP : Mar Daring, PA-C  Allergies:   Allergies  Allergen Reactions  . Amoxicillin-Pot Clavulanate   . Levsin  [Hyoscyamine]     Other reaction(s): Dizzyness  . Penicillins   . Metformin Diarrhea  . Metformin And Related Diarrhea    Medications: Outpatient Encounter Medications as of 11/27/2020  Medication Sig  . albuterol (VENTOLIN HFA) 108 (90 Base) MCG/ACT inhaler Inhale 2 puffs into the lungs every 6 (six) hours as needed for wheezing or shortness of breath.  Marland Kitchen amLODipine-benazepril (LOTREL) 5-20 MG capsule Take 1 capsule by mouth daily.  . Ascorbic Acid (VITAMIN C) 1000 MG tablet Take 1,000 mg by mouth daily.  Marland Kitchen atenolol (TENORMIN) 50 MG tablet Take 1 tablet (50 mg total) by mouth daily.  . Blood Glucose Monitoring Suppl (CONTOUR NEXT USB MONITOR) w/Device KIT 1 kit by Does not apply route daily. To check blood sugar once daily.  Angelia Mould Safety Lancets 26G MISC 1 Device by Does not apply route daily. To check blood sugar once daily.  . cetirizine (ZYRTEC) 10 MG tablet Take 10 mg by mouth daily.  . Cholecalciferol (DIALYVITE VITAMIN D 5000 PO) Take 5,000 Units by mouth daily.  Marland Kitchen CINNAMON PO Take 350 mg by mouth daily.  . dapagliflozin propanediol (FARXIGA) 10 MG TABS tablet Take 1 tablet (10 mg total) by mouth daily before breakfast.  . glucose blood (CONTOUR NEXT TEST) test strip To check blood sugar once daily.  Marland Kitchen glyBURIDE (DIABETA) 2.5 MG tablet Take 1 tablet (2.5 mg total) by mouth daily with breakfast. (Patient taking differently: Take 2.5 mg by mouth in the morning and at bedtime.)  . Insulin Glargine (BASAGLAR KWIKPEN) 100 UNIT/ML Inject 10 Units into the skin daily.  . Insulin Pen Needle (NOVOFINE PLUS) 32G X 4 MM MISC To use with ozempic injections  . ipratropium-albuterol (DUONEB) 0.5-2.5 (3) MG/3ML SOLN Take 3  mLs by nebulization every 6 (six) hours as needed. (Patient not taking: No sig reported)  . rosuvastatin (CRESTOR) 5 MG tablet Take 1 tablet (5 mg total) by mouth daily.  . sodium chloride 0.9 % nebulizer solution Take 3 mLs by nebulization as needed for wheezing. (Patient not taking: Reported on 11/21/2020)  . TURMERIC CURCUMIN PO Take 2,000 mg by mouth daily.  Marland Kitchen ZINC GLUCONATE PO Take 11 mg by mouth daily. With Quercetin with 250 mg   No facility-administered encounter medications on file as of 11/27/2020.    Current Diagnosis: Patient Active Problem List   Diagnosis Date Noted  . Acute respiratory disease due to COVID-19 virus 10/06/2020  . 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 Diabetes mellitus type 2, uncontrolled ([place]) 04/29/2015  . History of digestive disease 04/29/2015  . 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 Hypercholesterolemia without hypertriglyceridemia 12/11/2009  . Apnea, sleep 07/30/2008  . Colon, diverticulosis 06/07/2008  . Essential (primary) hypertension 04/25/2006    Goals Addressed   None     Follow-Up:  Pharmacist Review    Unable to leave a message to inform patient per clinical pharmacist and PCP plan:  - START Rosuvastatin 5 mg daily. A new prescription was sent in to [facility].   [place] Pharmacist Assistant [phone]

## 2020-11-27 NOTE — Telephone Encounter (Signed)
-----   Message from Germaine Pomfret, [facility] sent at 11/24/2020  1:20 PM EST ----- Regarding: FW: CCM Pharmacist Recommendations Can you please let Ms. Isidoro know that [place] and I were in agreement about the following plan:   - START Rosuvastatin 5 mg daily. A new prescription was sent in to [facility].   If you'd like you can wait until you reach out about her patient assistance documentation to include it all in one note.   Thanks, [place] [phone]  ----- Message ----- From: Rubye Beach Sent: 11/24/2020   1:11 PM EST To: Germaine Pomfret, RPH Subject: RE: CCM Pharmacist Recommendations             Yes that plan sounds perfect.   I will send in rosuvastatin.  JB ----- Message ----- From: Germaine Pomfret, [facility] Sent: 11/24/2020   1:08 PM EST To: Mar Daring, PA-C Subject: CCM Pharmacist Recommendations                 Good afternoon!   Given patient's history of T2DM and ASCVD risk of 16%, she would be a candidate for a moderate intensity statin. She was amenable to this idea. What would you think about starting her on rosuvastatin 5 mg daily?   Additionally, I am working on getting her Nancee Liter approved for patient assistance. She reports her blood sugars are already much improved compared to where they were previously. Once we get her approved, what would you think about having her go on a titration plan to start optimizing her dose of Basaglar? I can coordinate with her to increase her injection by 2 units every 3 days if her blood sugars remain above 150, or any other parameters if you would prefer.   Please let me know your thoughts,   [place] [phone]

## 2020-11-27 NOTE — Chronic Care Management (AMB) (Signed)
Chronic Care Management Pharmacy Assistant   Name: Shari Lee  MRN: [id number] DOB: [birth year]  Reason for Encounter: Patient Assistance Coordination  PCP : Mar Daring, PA-C  11/27/2020- Patient assistance forms filled our for [place] with [place] Patient Assistance Program. Called patient to see if she would like application mailed or would like to come to the office to sign.  Left message for patient to return call.  Patient returned she would like application mailed to her. Patient aware I will have all information she needs to fill in and/or sign highlighted and if she could provide of proof of income with application and to return to PCP office for Fenton Malling, PA-C to sign and fax. [name], CPP notified.   Allergies:   Allergies  Allergen Reactions   Amoxicillin-Pot Clavulanate    Levsin  [Hyoscyamine]     Other reaction(s): Dizzyness   Penicillins    Metformin Diarrhea   Metformin And Related Diarrhea    Medications: Outpatient Encounter Medications as of 11/27/2020  Medication Sig   albuterol (VENTOLIN HFA) 108 (90 Base) MCG/ACT inhaler Inhale 2 puffs into the lungs every 6 (six) hours as needed for wheezing or shortness of breath.   amLODipine-benazepril (LOTREL) 5-20 MG capsule Take 1 capsule by mouth daily.   Ascorbic Acid (VITAMIN C) 1000 MG tablet Take 1,000 mg by mouth daily.   atenolol (TENORMIN) 50 MG tablet Take 1 tablet (50 mg total) by mouth daily.   Blood Glucose Monitoring Suppl (CONTOUR NEXT USB MONITOR) w/Device KIT 1 kit by Does not apply route daily. To check blood sugar once daily.   CareTouch Safety Lancets 26G MISC 1 Device by Does not apply route daily. To check blood sugar once daily.   cetirizine (ZYRTEC) 10 MG tablet Take 10 mg by mouth daily.   Cholecalciferol (DIALYVITE VITAMIN D 5000 PO) Take 5,000 Units by mouth daily.   CINNAMON PO Take 350 mg by mouth daily.   dapagliflozin propanediol (FARXIGA) 10  MG TABS tablet Take 1 tablet (10 mg total) by mouth daily before breakfast.   glucose blood (CONTOUR NEXT TEST) test strip To check blood sugar once daily.   glyBURIDE (DIABETA) 2.5 MG tablet Take 1 tablet (2.5 mg total) by mouth daily with breakfast. (Patient taking differently: Take 2.5 mg by mouth in the morning and at bedtime.)   Insulin Glargine (BASAGLAR KWIKPEN) 100 UNIT/ML Inject 10 Units into the skin daily.   Insulin Pen Needle (NOVOFINE PLUS) 32G X 4 MM MISC To use with ozempic injections   ipratropium-albuterol (DUONEB) 0.5-2.5 (3) MG/3ML SOLN Take 3 mLs by nebulization every 6 (six) hours as needed. (Patient not taking: No sig reported)   rosuvastatin (CRESTOR) 5 MG tablet Take 1 tablet (5 mg total) by mouth daily.   sodium chloride 0.9 % nebulizer solution Take 3 mLs by nebulization as needed for wheezing. (Patient not taking: Reported on 11/21/2020)   TURMERIC CURCUMIN PO Take 2,000 mg by mouth daily.   ZINC GLUCONATE PO Take 11 mg by mouth daily. With Quercetin with 250 mg   No facility-administered encounter medications on file as of 11/27/2020.    Current Diagnosis: Patient Active Problem List   Diagnosis Date Noted   Acute respiratory disease due to COVID-19 virus 10/06/2020   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   Diabetes mellitus type 2, uncontrolled ([place]) 04/29/2015   History of digestive disease 04/29/2015   Osteochondritis of tibial tuberosity 04/29/2015   Hypercholesterolemia without hypertriglyceridemia 12/11/2009   Apnea,  sleep 07/30/2008   Colon, diverticulosis 06/07/2008   Essential (primary) hypertension 04/25/2006     Follow-Up:  Patient [place], [place] Pharmacist Assistant [phone] CCM time: 22 mins

## 2020-12-03 LAB — BLOOD GAS, VENOUS
Acid-base deficit: 3.8 mmol/L — ABNORMAL HIGH (ref 0.0–2.0)
Bicarbonate: 21.5 mmol/L (ref 20.0–28.0)
O2 Saturation: 38.9 %
Patient temperature: 37
pCO2, Ven: 39 mmHg — ABNORMAL LOW (ref 44.0–60.0)
pH, Ven: 7.35 (ref 7.250–7.430)

## 2020-12-08 ENCOUNTER — Telehealth: Payer: 59

## 2020-12-12 ENCOUNTER — Encounter: Payer: Self-pay | Admitting: Physician Assistant

## 2020-12-12 ENCOUNTER — Other Ambulatory Visit: Payer: Self-pay

## 2020-12-12 ENCOUNTER — Ambulatory Visit: Payer: 59 | Admitting: Physician Assistant

## 2020-12-12 VITALS — BP 137/56 | HR 76 | Temp 98.8°F | Resp 16 | Ht 62.0 in | Wt 183.0 lb

## 2020-12-12 DIAGNOSIS — E1165 Type 2 diabetes mellitus with hyperglycemia: Secondary | ICD-10-CM | POA: Diagnosis not present

## 2020-12-12 DIAGNOSIS — I10 Essential (primary) hypertension: Secondary | ICD-10-CM

## 2020-12-12 DIAGNOSIS — Z794 Long term (current) use of insulin: Secondary | ICD-10-CM

## 2020-12-12 MED ORDER — AMLODIPINE BESY-BENAZEPRIL HCL 10-40 MG PO CAPS: 1.0000 | ORAL_CAPSULE | Freq: Every day | ORAL | 3 refills | Status: DC

## 2020-12-12 MED ORDER — GLYBURIDE 2.5 MG PO TABS: 2.5000 mg | ORAL_TABLET | Freq: Every day | ORAL | 1 refills | Status: DC

## 2020-12-12 MED ORDER — BASAGLAR KWIKPEN 100 UNIT/ML ~~LOC~~ SOPN: 15.0000 [IU] | PEN_INJECTOR | Freq: Every day | SUBCUTANEOUS | 1 refills | Status: DC

## 2020-12-12 NOTE — Progress Notes (Signed)
Established patient visit   Patient: Shari Lee   DOB: 05/24/1956   65 y.o. Female  MRN: [id number] Visit Date: 12/12/2020  Today's healthcare provider: Mar Daring, PA-C   Chief Complaint  Patient presents with  . Hypertension   Subjective    HPI  Hypertension, follow-up  BP Readings from Last 3 Encounters:  12/12/20 (!) 137/56  11/10/20 (!) 160/98  10/27/20 (!) 152/73   Wt Readings from Last 3 Encounters:  12/12/20 183 lb (83 kg)  11/10/20 182 lb (82.6 kg)  10/27/20 170 lb (77.1 kg)     She was last seen for hypertension 1 months ago.  BP at that visit was 160/98. Management since that visit includes no medication changes.  She reports good compliance with treatment. She is not having side effects.  She is following a Regular diet. She is not exercising. She does not smoke.  Use of agents associated with hypertension: none.   Outside blood pressures are checked daily. Patient reports that her BP has averaged in the 130/60s   Symptoms: No chest pain No chest pressure  No palpitations No syncope  No dyspnea No orthopnea  No paroxysmal nocturnal dyspnea No lower extremity edema   Pertinent labs: Lab Results  Component Value Date   CHOL 164 10/01/2020   HDL 48 10/01/2020   LDLCALC 99 10/01/2020   TRIG 89 10/01/2020   CHOLHDL 3.4 10/01/2020   Lab Results  Component Value Date   NA 134 (L) 10/13/2020   K 3.9 10/13/2020   CREATININE 0.52 10/13/2020   GFRNONAA >60 10/13/2020   GFRAA 60 10/01/2020   GLUCOSE 86 10/13/2020     The 10-year ASCVD risk score Mikey Bussing DC Jr., et al., 2013) is: 12.3%   Patient Active Problem List   Diagnosis Date Noted  . Acute respiratory disease due to COVID-19 virus 10/06/2020  . 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 Diabetes mellitus type 2, uncontrolled ([place]) 04/29/2015  . History of digestive disease 04/29/2015  . 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 Hypercholesterolemia without  hypertriglyceridemia 12/11/2009  . Apnea, sleep 07/30/2008  . Colon, diverticulosis 06/07/2008  . Essential (primary) hypertension 04/25/2006   Past Medical History:  Diagnosis Date  . Diabetes mellitus without complication ([place])   . Headache   . Hypertension        Medications: Outpatient Medications Prior to Visit  Medication Sig  . albuterol (VENTOLIN HFA) 108 (90 Base) MCG/ACT inhaler Inhale 2 puffs into the lungs every 6 (six) hours as needed for wheezing or shortness of breath.  Marland Kitchen amLODipine-benazepril (LOTREL) 5-20 MG capsule Take 1 capsule by mouth daily.  . Ascorbic Acid (VITAMIN C) 1000 MG tablet Take 1,000 mg by mouth daily.  Marland Kitchen atenolol (TENORMIN) 50 MG tablet Take 1 tablet (50 mg total) by mouth daily.  . Blood Glucose Monitoring Suppl (CONTOUR NEXT USB MONITOR) w/Device KIT 1 kit by Does not apply route daily. To check blood sugar once daily.  Angelia Mould Safety Lancets 26G MISC 1 Device by Does not apply route daily. To check blood sugar once daily.  . cetirizine (ZYRTEC) 10 MG tablet Take 10 mg by mouth daily.  . Cholecalciferol (DIALYVITE VITAMIN D 5000 PO) Take 5,000 Units by mouth daily.  Marland Kitchen CINNAMON PO Take 350 mg by mouth daily.  . dapagliflozin propanediol (FARXIGA) 10 MG TABS tablet Take 1 tablet (10 mg total) by mouth daily before breakfast.  . glucose blood (CONTOUR NEXT TEST) test strip To check  blood sugar once daily.  Marland Kitchen glyBURIDE (DIABETA) 2.5 MG tablet Take 1 tablet (2.5 mg total) by mouth daily with breakfast. (Patient taking differently: Take 2.5 mg by mouth in the morning and at bedtime.)  . Insulin Glargine (BASAGLAR KWIKPEN) 100 UNIT/ML Inject 10 Units into the skin daily.  . Insulin Pen Needle (NOVOFINE PLUS) 32G X 4 MM MISC To use with ozempic injections  . ipratropium-albuterol (DUONEB) 0.5-2.5 (3) MG/3ML SOLN Take 3 mLs by nebulization every 6 (six) hours as needed. (Patient not taking: No sig reported)  . rosuvastatin (CRESTOR) 5 MG tablet Take 1  tablet (5 mg total) by mouth daily.  . sodium chloride 0.9 % nebulizer solution Take 3 mLs by nebulization as needed for wheezing. (Patient not taking: Reported on 11/21/2020)  . TURMERIC CURCUMIN PO Take 2,000 mg by mouth daily.  Marland Kitchen ZINC GLUCONATE PO Take 11 mg by mouth daily. With Quercetin with 250 mg   No facility-administered medications prior to visit.    Review of Systems  Constitutional: Negative.   Respiratory: Negative.   Cardiovascular: Negative.   Endocrine: Negative.   Neurological: Negative.     Last CBC Lab Results  Component Value Date   WBC 11.3 (H) 10/13/2020   HGB 12.3 10/13/2020   HCT 36.9 10/13/2020   MCV 83.5 10/13/2020   MCH 27.8 10/13/2020   RDW 13.2 10/13/2020   PLT 388 08/67/6195   Last metabolic panel Lab Results  Component Value Date   GLUCOSE 86 10/13/2020   NA 134 (L) 10/13/2020   K 3.9 10/13/2020   CL 99 10/13/2020   CO2 29 10/13/2020   BUN 16 10/13/2020   CREATININE 0.52 10/13/2020   GFRNONAA >60 10/13/2020   GFRAA 60 10/01/2020   CALCIUM 7.9 (L) 10/13/2020   PHOS 4.0 10/08/2020   PROT 5.3 (L) 10/12/2020   ALBUMIN 2.4 (L) 10/12/2020   LABGLOB 2.7 10/01/2020   AGRATIO 1.6 10/01/2020   BILITOT 0.7 10/12/2020   ALKPHOS 43 10/12/2020   AST 38 10/12/2020   ALT 44 10/12/2020   ANIONGAP 6 10/13/2020       Objective    BP (!) 137/56   Pulse 76   Temp 98.8 F (37.1 C)   Resp 16   Ht _0  (1.575 m)   Wt 183 lb (83 kg)   BMI 33.47 kg/m  BP Readings from Last 3 Encounters:  12/12/20 (!) 137/56  11/10/20 (!) 160/98  10/27/20 (!) 152/73   Wt Readings from Last 3 Encounters:  12/12/20 183 lb (83 kg)  11/10/20 182 lb (82.6 kg)  10/27/20 170 lb (77.1 kg)       Physical Exam Vitals reviewed.  Constitutional:      General: She is not in acute distress.    Appearance: Normal appearance. She is well-developed and well-nourished. She is obese. She is not ill-appearing or diaphoretic.  Cardiovascular:     Rate and Rhythm: Normal  rate and regular rhythm.     Heart sounds: Normal heart sounds. No murmur heard. No friction rub. No gallop.   Pulmonary:     Effort: Pulmonary effort is normal. No respiratory distress.     Breath sounds: Normal breath sounds. No wheezing or rales.  Musculoskeletal:     Cervical back: Normal range of motion and neck supple.  Neurological:     Mental Status: She is alert.     No results found for any visits on 12/12/20.  Assessment & Plan     1. Type  2 diabetes mellitus with hyperglycemia, with long-term current use of insulin ([place]) Will increase Basaglar to 15 units as below. Decrease Glyburide 2.5 from BID to one daily. F/U in 3 months.  - Insulin Glargine (BASAGLAR KWIKPEN) 100 UNIT/ML; Inject 15 Units into the skin daily.  Dispense: 12 mL; Refill: 3 - glyBURIDE (DIABETA) 2.5 MG tablet; Take 1 tablet (2.5 mg total) by mouth daily with breakfast.  Dispense: 90 tablet; Refill: 1  2. Primary hypertension Much improved. Continue Atenolol 40m daily and Amlodipine-Benazepril 10-439m     No follow-ups on file.      I,Reynolds BowlPA-C, have reviewed all documentation for this visit. The documentation on 12/12/20 for the exam, diagnosis, procedures, and orders are all accurate and complete.   JeRubye BeachBuMayo Clinic Health Sys Albt [phone]) [phone])  [place]

## 2020-12-16 ENCOUNTER — Ambulatory Visit: Payer: Self-pay

## 2020-12-16 DIAGNOSIS — Z794 Long term (current) use of insulin: Secondary | ICD-10-CM

## 2020-12-16 DIAGNOSIS — E1165 Type 2 diabetes mellitus with hyperglycemia: Secondary | ICD-10-CM

## 2020-12-16 NOTE — Progress Notes (Signed)
[employer] Patient Assistance form for [employer] completed by patient and faxed for review on 12/16/20   [place] [phone]

## 2020-12-22 ENCOUNTER — Telehealth: Payer: Self-pay | Admitting: Physician Assistant

## 2020-12-22 NOTE — Telephone Encounter (Signed)
Is it okay to leave samples if we have any?  Thanks,   -Mickel Baas

## 2020-12-22 NOTE — Telephone Encounter (Signed)
Ok to give if we have any

## 2020-12-22 NOTE — Telephone Encounter (Signed)
Patient called to ask if the doctor had some samples of her medication for Insulin Glargine (BASAGLAR KWIKPEN) 100 UNIT/ML.  She stated that the doctor told her if she needed some samples, to call the office.  Please advise and call patient to let her know at [phone]

## 2020-12-22 NOTE — Telephone Encounter (Signed)
Do we have samples ??

## 2020-12-23 ENCOUNTER — Encounter: Payer: Self-pay | Admitting: Physician Assistant

## 2020-12-23 DIAGNOSIS — E1165 Type 2 diabetes mellitus with hyperglycemia: Secondary | ICD-10-CM

## 2020-12-23 NOTE — Telephone Encounter (Signed)
Patient advised to pick up one box.

## 2021-01-02 ENCOUNTER — Telehealth: Payer: Self-pay

## 2021-01-02 ENCOUNTER — Other Ambulatory Visit: Payer: Self-pay | Admitting: Physician Assistant

## 2021-01-02 ENCOUNTER — Ambulatory Visit (INDEPENDENT_AMBULATORY_CARE_PROVIDER_SITE_OTHER): Payer: 59

## 2021-01-02 DIAGNOSIS — E1169 Type 2 diabetes mellitus with other specified complication: Secondary | ICD-10-CM | POA: Diagnosis not present

## 2021-01-02 DIAGNOSIS — E1165 Type 2 diabetes mellitus with hyperglycemia: Secondary | ICD-10-CM

## 2021-01-02 DIAGNOSIS — E785 Hyperlipidemia, unspecified: Secondary | ICD-10-CM

## 2021-01-02 DIAGNOSIS — E1159 Type 2 diabetes mellitus with other circulatory complications: Secondary | ICD-10-CM | POA: Diagnosis not present

## 2021-01-02 DIAGNOSIS — I152 Hypertension secondary to endocrine disorders: Secondary | ICD-10-CM

## 2021-01-02 DIAGNOSIS — Z794 Long term (current) use of insulin: Secondary | ICD-10-CM

## 2021-01-02 MED ORDER — BASAGLAR KWIKPEN 100 UNIT/ML ~~LOC~~ SOPN: 40.0000 [IU] | PEN_INJECTOR | Freq: Every day | SUBCUTANEOUS | 1 refills | Status: DC

## 2021-01-02 NOTE — Chronic Care Management (AMB) (Signed)
01/02/2021- Called [employer] to follow up on patient assistance medication Basaglar. Spoke with Ava with [employer], she was unable to find application, was transferred to [facility] with [employer] patient assistance program, application needed updated information on provider portion, address, phone number, provider, but then she noticed that the updated information on application was received and will get application processed. Processing takes 10-14 days then if patient is approved they will get prescription over to the pharmacy to deliver to patient. Called patient to give updates. Patient is aware, she is also aware of coupon at PCP office available for patient to pick up to use at the pharmacy to get [place] for $25 for month supply. [name], CPP notified.  Pattricia Boss, [place] Pharmacist Assistant [phone]

## 2021-01-02 NOTE — Progress Notes (Signed)
Chronic Care Management Pharmacy Note  01/02/2021 Name:  Shari Lee MRN:  [id number] DOB:  04/03/1956  Subjective: Cydnee Fuquay is an 65 y.o. year old female who is a primary patient of Mar Daring, [place].  The CCM team was consulted for assistance with disease management and care coordination needs.    Engaged with patient by telephone for follow up visit in response to provider referral for pharmacy case management and/or care coordination services.   Consent to Services:  The patient was given information about Chronic Care Management services, agreed to services, and gave verbal consent prior to initiation of services.  Please see initial visit note for detailed documentation.   Patient Care Team: Rubye Beach as PCP - General (Family Medicine) Germaine Pomfret, [facility] (Pharmacist)  Recent office visits: 12/12/20: Patient presented to Fenton Malling, PA-C for follow-up. Amlodipine-Benazparil increased to 10-40 mg daily. Basaglar increased to 15 units nightly. Glyburide decreased to once daily.  11/10/20: Patient presented to Fenton Malling, PA-C for follow-up. BP in clinic 160/98. Synjardy stopped (Cost), glyburide and [name] started. Diarrhea with metformin  10/27/20: Video visit with Fenton Malling, PA-C for respiratory disease following Covid-19 infection. Patient started on Basaglar 10 units daily, Tussionex, DuoNeb, and NaCl Neb.   Recent consult visits: None in past 6 months.   Hospital visits: 10/06/20: Patient hospitalized for Covid-19 infection.   Objective:  Lab Results  Component Value Date   CREATININE 0.52 10/13/2020   BUN 16 10/13/2020   GFRNONAA >60 10/13/2020   GFRAA 60 10/01/2020   NA 134 (L) 10/13/2020   K 3.9 10/13/2020   CALCIUM 7.9 (L) 10/13/2020   CO2 29 10/13/2020   GLUCOSE 86 10/13/2020    Lab Results  Component Value Date/Time   HGBA1C 13.5 (H) 10/01/2020 02:35 PM   HGBA1C 11.2 (A) 01/31/2019 10:03 AM    HGBA1C 14.0 (A) 12/27/2018 07:19 AM   HGBA1C 13.7 (H) 12/15/2018 08:05 AM   MICROALBUR negative 12/15/2018 09:10 AM    Last diabetic Eye exam:  Lab Results  Component Value Date/Time   HMDIABEYEEXA No Retinopathy 07/31/2018 12:00 AM    Last diabetic Foot exam: No results found for: HMDIABFOOTEX   Lab Results  Component Value Date   CHOL 164 10/01/2020   HDL 48 10/01/2020   LDLCALC 99 10/01/2020   TRIG 89 10/01/2020   CHOLHDL 3.4 10/01/2020    Hepatic Function Latest Ref Rng & Units 10/12/2020 10/11/2020 10/10/2020  Total Protein 6.5 - 8.1 g/dL 5.3(L) 5.2(L) 5.9(L)  Albumin 3.5 - 5.0 g/dL 2.4(L) 2.4(L) 2.6(L)  AST 15 - 41 U/L 38 46(H) 47(H)  ALT 0 - 44 U/L 44 44 35  Alk Phosphatase 38 - 126 U/L 43 51 59  Total Bilirubin 0.3 - 1.2 mg/dL 0.7 0.7 0.9    Lab Results  Component Value Date/Time   TSH 2.410 10/01/2020 02:35 PM   TSH 2.090 12/15/2018 08:05 AM    CBC Latest Ref Rng & Units 10/13/2020 10/12/2020 10/11/2020  WBC 4.0 - 10.5 K/uL 11.3(H) 18.2(H) 15.9(H)  Hemoglobin 12.0 - 15.0 g/dL 12.3 12.3 12.0  Hematocrit 36.0 - 46.0 % 36.9 35.4(L) 35.8(L)  Platelets 150 - 400 K/uL 388 417(H) 410(H)    No results found for: VD25OH  Clinical ASCVD: No  The 10-year ASCVD risk score Mikey Bussing DC Jr., et al., 2013) is: 13.6%   Values used to calculate the score:     Age: 15 years     Sex: Female  Is Non-Hispanic African American: No     Diabetic: Yes     Tobacco smoker: No     Systolic Blood Pressure: 269 mmHg     Is BP treated: Yes     HDL Cholesterol: 48 mg/dL     Total Cholesterol: 164 mg/dL    Depression screen [facility] 2/9 10/27/2020 10/01/2020 01/03/2020  Decreased Interest 0 0 0  Down, Depressed, Hopeless 0 0 0  PHQ - 2 Score 0 0 0  Altered sleeping 0 - -  Tired, decreased energy 0 - -  Change in appetite 0 - -  Feeling bad or failure about yourself  0 - -  Trouble concentrating 0 - -  Moving slowly or fidgety/restless 0 - -  Suicidal thoughts 0 - -  PHQ-9 Score 0  - -  Difficult doing work/chores Not difficult at all - -     Social History   Tobacco Use  Smoking Status Never Smoker  Smokeless Tobacco Never Used   BP Readings from Last 3 Encounters:  12/12/20 (!) 137/56  11/10/20 (!) 160/98  10/27/20 (!) 152/73   Pulse Readings from Last 3 Encounters:  12/12/20 76  11/10/20 84  10/27/20 96   Wt Readings from Last 3 Encounters:  12/12/20 183 lb (83 kg)  11/10/20 182 lb (82.6 kg)  10/27/20 170 lb (77.1 kg)   BMI Readings from Last 3 Encounters:  12/12/20 33.47 kg/m  11/10/20 33.29 kg/m  10/27/20 31.09 kg/m    Assessment/Interventions: Review of patient past medical history, allergies, medications, health status, including review of consultants reports, laboratory and other test data, was performed as part of comprehensive evaluation and provision of chronic care management services.   SDOH:  (Social Determinants of Health) assessments and interventions performed: Yes SDOH Interventions   Flowsheet Row Most Recent Value  SDOH Interventions   Financial Strain Interventions Other (Comment)  [PAP in progress]      CCM Care Plan  Allergies  Allergen Reactions  . Amoxicillin-Pot Clavulanate   . Levsin  [Hyoscyamine]     Other reaction(s): Dizzyness  . Penicillins   . Metformin Diarrhea  . Metformin And Related Diarrhea    Medications Reviewed Today    Reviewed by Germaine Pomfret, [facility] (Pharmacist) on 01/02/21 at [place] List Status: <None>  Medication Order Taking? Sig Documenting Provider Last Dose Status Informant  albuterol (VENTOLIN HFA) 108 (90 Base) MCG/ACT inhaler [id number]  Inhale 2 puffs into the lungs every 6 (six) hours as needed for wheezing or shortness of breath. Nance Pear, MD  Active   amLODipine-benazepril (LOTREL) 10-40 MG capsule [id number] Yes Take 1 capsule by mouth daily. Mar Daring, PA-C Taking Active   Ascorbic Acid (VITAMIN C) 1000 MG tablet [id number]  Take 1,000 mg by mouth  daily. [provider]  Active   atenolol (TENORMIN) 50 MG tablet [id number] Yes Take 1 tablet (50 mg total) by mouth daily. Mar Daring, PA-C Taking Active   Blood Glucose Monitoring Suppl (CONTOUR NEXT USB MONITOR) w/Device KIT [id number]  1 kit by Does not apply route daily. To check blood sugar once daily. Mar Daring, PA-C  Active   CareTouch Safety Lancets 26G MISC [id number]  1 Device by Does not apply route daily. To check blood sugar once daily. Fenton Malling M, PA-C  Active   cetirizine (ZYRTEC) 10 MG tablet [id number]  Take 10 mg by mouth daily. [provider]  Active   Cholecalciferol (DIALYVITE VITAMIN D 5000 PO)  [id number]  Take 5,000 Units by mouth daily. [provider]  Active   CINNAMON PO [id number]  Take 350 mg by mouth daily. [provider]  Active   dapagliflozin propanediol (FARXIGA) 10 MG TABS tablet [id number]  Take 1 tablet (10 mg total) by mouth daily before breakfast. Mar Daring, PA-C  Active   glucose blood (CONTOUR NEXT TEST) test strip [id number]  To check blood sugar once daily. Fenton Malling M, PA-C  Active   glyBURIDE (DIABETA) 2.5 MG tablet [id number] Yes Take 1 tablet (2.5 mg total) by mouth daily with breakfast.  Patient taking differently: Take 5 mg by mouth daily with breakfast.   Mar Daring, PA-C Taking Active   Insulin Glargine (BASAGLAR KWIKPEN) 100 UNIT/ML [id number] Yes Inject 15 Units into the skin daily. Mar Daring, PA-C Taking Active   Insulin Pen Needle (NOVOFINE PLUS) 32G X 4 MM MISC [id number]  To use with ozempic injections Fenton Malling M, PA-C  Active   ipratropium-albuterol (DUONEB) 0.5-2.5 (3) MG/3ML SOLN [id number]  Take 3 mLs by nebulization every 6 (six) hours as needed.  Patient not taking: No sig reported   Mar Daring, PA-C  Active   rosuvastatin (CRESTOR) 5 MG tablet [id number]  Take 1 tablet (5 mg total) by mouth daily. Fenton Malling M,  [place]  Active   sodium chloride 0.9 % nebulizer solution [id number]  Take 3 mLs by nebulization as needed for wheezing.  Patient not taking: Reported on 11/21/2020   Rubye Beach  Active   TURMERIC CURCUMIN PO [id number]  Take 2,000 mg by mouth daily. [provider]  Active   ZINC GLUCONATE PO [id number]  Take 11 mg by mouth daily. With Quercetin with 250 mg [provider]  Active           Patient Active Problem List   Diagnosis Date Noted  . Acute respiratory disease due to COVID-19 virus 10/06/2020  . 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 Diabetes mellitus type 2, uncontrolled ([place]) 04/29/2015  . History of digestive disease 04/29/2015  . 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 Hypercholesterolemia without hypertriglyceridemia 12/11/2009  . Apnea, sleep 07/30/2008  . Colon, diverticulosis 06/07/2008  . Essential (primary) hypertension 04/25/2006    Immunization History  Administered Date(s) Administered  . Influenza Inj Mdck Quad Pf 07/30/2019  . Influenza Inj Mdck Quad With Preservative 08/27/2018  . Influenza,inj,Quad PF,6+ Mos 07/28/2015  . Pneumococcal Conjugate-13 08/27/2018  . Td 08/26/2011  . Tdap 08/26/2011    Conditions to be addressed/monitored:  Hypertension, Hyperlipidemia and Diabetes  Care Plan : General Pharmacy (Adult)  Updates made by Germaine Pomfret, RPH since 01/02/2021 12:00 AM    Problem: Hypertension, Hyperlipidemia, Diabetes   Priority: High    Long-Range Goal: Patient-Specific Goal   Start Date: 11/21/2020  Expected End Date: 12/19/2020  This Visit's Progress: Not on track  Recent Progress: On track  Priority: High  Note:   Current Barriers:  . Unable to independently afford treatment regimen . Unable to achieve control of Diabetes    Pharmacist Clinical Goal(s):  Marland Kitchen Over the next 90 days, patient will achieve control of diabetes as evidenced by A1c <8% through collaboration with PharmD and provider.    Interventions: . 1:1 collaboration with Mar Daring, PA-C regarding development and update of comprehensive plan of care as evidenced by provider attestation and co-signature . Inter-disciplinary care team collaboration (see longitudinal plan of care) . Comprehensive medication review performed; medication list updated  in electronic medical record  Hypertension (BP goal <130/80) -controlled -Current treatment: . Amlodipine-benazepril 5-20 mg daily ([facility])  . Atenolol 50 mg daily ([facility])  -Medications previously tried: NA  -Current home readings:  -Denies hypotensive/hypertensive symptoms -Educated on BP goals and benefits of medications for prevention of heart attack, stroke and kidney damage; Daily salt intake goal < 2300 mg; Exercise goal of 150 minutes per week; Importance of home blood pressure monitoring; -Counseled to monitor BP at home weekly, document, and provide log at future appointments -Recommended to continue current medication  Hyperlipidemia: (LDL goal < 70) -uncontrolled -Current treatment: . None -Medications previously tried: NA  -Patient did not pick up rosuvastatin prescription from pharmacy. -Educated on Benefits of statin for ASCVD risk reduction; Importance of limiting foods high in cholesterol; -Will call [facility] to have rosuvastatin prescription filled for patient.    Diabetes (A1c goal <8%)  Diagnosed 2005  -uncontrolled -Current medications: . Farxiga 10 mg daily [employer])  . Glyburide 2.5 mg daily (twice daily)  . Basaglar 15 units daily  -Medications previously tried: Metformin (Diarrhea), Jardiance, Ozempic (ineffective)   -Current home glucose readings  Fasting  18-Mar 212  17-Mar 221  16-Mar 231  15-Mar 242  14-Mar 245  13-Mar 198  12-Mar 238  11-Mar 213  10-Mar 207  9-Mar 186  Average 219    -Denies hypoglycemic symptoms, but does report polydipsia, polyuria  -Current meal patterns:  . breakfast: Sausage  Biscuit  . lunch: Sandwich   . dinner: Mashed Potatoes  . snacks: Snacks on a lot of sweets/candies throughout the day . drinks: Diet soda. Drinks 4-5 glasses of water daily -Current exercise: Health visitor twice weekly: weights, resistance bands, walking 30 minutes. Limited by dyspnea following covid. - Extensive counseling on carbohydrate counting, appropriate food selection, and weight management given. Patient set a goal of one day per week where she does not snack on candy or sugary foods. Discussed health snack options like peanut butter, appropriate servings of fruit, or cheese as alternative snacks.   -Recommended increasing Basaglar 2 units every 3 days until fasting sugars are ranging 100-150 mg/dL  -Assessed patient finances. Patient Assistance for Basaglar submitted on 12/16/20. Will call [employer] to request application update. Coupon card for [employer] for patient to help with cost when she needs a refill.    Patient Goals/Self-Care Activities . Over the next 90 days, patient will:  - check glucose daily, document, and provide at future appointments check blood pressure weekly, document, and provide at future appointments target a minimum of 150 minutes of moderate intensity exercise weekly engage in dietary modifications by Minimizing sodium, carbohydrate, and fat intake.   Follow Up Plan: Telephone follow up appointment with care management team member scheduled for:  02/03/2021 at 2:00 PM      Medication Assistance: Application for Basaglar  medication assistance program. in process.  Anticipated assistance start date 12/30/20.  See plan of care for additional detail.  Patient's preferred pharmacy is:  [facility] [address] ([address]), [place] [address] [place] ([place]) [place] [address] Phone: [phone] Fax: [phone]  Uses pill box? Yes Pt endorses 100% compliance  We discussed: Current pharmacy is  preferred with insurance plan and patient is satisfied with pharmacy services Patient decided to: Continue current medication management strategy  Care Plan and Follow Up Patient Decision:  Patient agrees to Care Plan and Follow-up.  Plan: Telephone follow up appointment with care management  team member scheduled for:  02/03/2021 at 2:00 PM  [place] [phone]

## 2021-01-02 NOTE — Patient Instructions (Signed)
Visit Information It was great speaking with you today!  Please let me know if you have any questions about our visit.  Goals Addressed            This Visit's Progress   . Monitor and Manage My Blood Sugar-Diabetes Type 2   Not on track    Timeframe:  Long-Range Goal Priority:  High Start Date: 11/21/2020                            Expected End Date: 12/19/2020                      Follow Up Date 01/02/2021    - check blood sugar at prescribed times - check blood sugar if I feel it is too high or too low - enter blood sugar readings and medication or insulin into daily log - take the blood sugar log to all doctor visits    Why is this important?    Checking your blood sugar at home helps to keep it from getting very high or very low.   Writing the results in a diary or log helps the doctor know how to care for you.   Your blood sugar log should have the time, date and the results.   Also, write down the amount of insulin or other medicine that you take.   Other information, like what you ate, exercise done and how you were feeling, will also be helpful.         Patient Care Plan: General Pharmacy (Adult)    Problem Identified: Hypertension, Hyperlipidemia, Diabetes   Priority: High    Long-Range Goal: Patient-Specific Goal   Start Date: 11/21/2020  Expected End Date: 12/19/2020  This Visit's Progress: Not on track  Recent Progress: On track  Priority: High  Note:   Current Barriers:  . Unable to independently afford treatment regimen . Unable to achieve control of Diabetes    Pharmacist Clinical Goal(s):  Marland Kitchen Over the next 90 days, patient will achieve control of diabetes as evidenced by A1c <8% through collaboration with PharmD and provider.   Interventions: . 1:1 collaboration with Mar Daring, PA-C regarding development and update of comprehensive plan of care as evidenced by provider attestation and co-signature . Inter-disciplinary care team collaboration  (see longitudinal plan of care) . Comprehensive medication review performed; medication list updated in electronic medical record  Hypertension (BP goal <130/80) -controlled -Current treatment: . Amlodipine-benazepril 5-20 mg daily (HS)  . Atenolol 50 mg daily (HS)  -Medications previously tried: NA  -Current home readings:  -Denies hypotensive/hypertensive symptoms -Educated on BP goals and benefits of medications for prevention of heart attack, stroke and kidney damage; Daily salt intake goal < 2300 mg; Exercise goal of 150 minutes per week; Importance of home blood pressure monitoring; -Counseled to monitor BP at home weekly, document, and provide log at future appointments -Recommended to continue current medication  Hyperlipidemia: (LDL goal < 70) -uncontrolled -Current treatment: . None -Medications previously tried: NA  -Patient did not pick up rosuvastatin prescription from pharmacy. -Educated on Benefits of statin for ASCVD risk reduction; Importance of limiting foods high in cholesterol; -Will call [facility] to have rosuvastatin prescription filled for patient.    Diabetes (A1c goal <8%)  Diagnosed 2005  -uncontrolled -Current medications: . Farxiga 10 mg daily [employer])  . Glyburide 2.5 mg daily (twice daily)  . Basaglar 15 units daily  -  Medications previously tried: Metformin (Diarrhea), Jardiance, Ozempic (ineffective)   -Current home glucose readings  Fasting  18-Mar 212  17-Mar 221  16-Mar 231  15-Mar 242  14-Mar 245  13-Mar 198  12-Mar 238  11-Mar 213  10-Mar 207  9-Mar 186  Average 219    -Denies hypoglycemic symptoms, but does report polydipsia, polyuria  -Current meal patterns:  . breakfast: Sausage Biscuit  . lunch: Sandwich   . dinner: Mashed Potatoes  . snacks: Snacks on a lot of sweets/candies throughout the day . drinks: Diet soda. Drinks 4-5 glasses of water daily -Current exercise: Health visitor twice weekly:  weights, resistance bands, walking 30 minutes. Limited by dyspnea following covid. - Extensive counseling on carbohydrate counting, appropriate food selection, and weight management given. Patient set a goal of one day per week where she does not snack on candy or sugary foods. Discussed health snack options like peanut butter, appropriate servings of fruit, or cheese as alternative snacks.   -Recommended increasing Basaglar 2 units every 3 days until fasting sugars are ranging 100-150 mg/dL  -Assessed patient finances. Patient Assistance for Basaglar submitted on 12/16/20. Will call [employer] to request application update. Coupon card for [employer] for patient to help with cost when she needs a refill.    Patient Goals/Self-Care Activities . Over the next 90 days, patient will:  - check glucose daily, document, and provide at future appointments check blood pressure weekly, document, and provide at future appointments target a minimum of 150 minutes of moderate intensity exercise weekly engage in dietary modifications by Minimizing sodium, carbohydrate, and fat intake.   Follow Up Plan: Telephone follow up appointment with care management team member scheduled for:  02/03/2021 at 2:00 PM      Patient agreed to services and verbal consent obtained.   The patient verbalized understanding of instructions, educational materials, and care plan provided today and declined offer to receive copy of patient instructions, educational materials, and care plan.   [place] [phone]

## 2021-01-02 NOTE — Progress Notes (Signed)
Insulin refilled.

## 2021-01-05 ENCOUNTER — Telehealth: Payer: Self-pay | Admitting: Physician Assistant

## 2021-01-05 NOTE — Telephone Encounter (Signed)
Jennis, Can I change this for the you

## 2021-01-05 NOTE — Telephone Encounter (Signed)
Yes please this is ok 

## 2021-01-05 NOTE — Telephone Encounter (Signed)
Pharmacy called to get approval to change patient's medication from Friendly to Lantus which is much cheaper for the patient.  Please advise and call pharmacy to confirm.  CB# [phone]

## 2021-01-06 NOTE — Telephone Encounter (Signed)
Advised 

## 2021-01-09 MED ORDER — BD PEN NEEDLE NANO 2ND GEN 32G X 4 MM MISC: 1 refills | Status: DC

## 2021-01-20 ENCOUNTER — Telehealth: Payer: Self-pay

## 2021-01-20 NOTE — Progress Notes (Addendum)
Chronic Care Management Pharmacy Assistant   Name: Shari Lee  MRN: [id number] DOB: Feb 27, 1956  Reason for Encounter:Diabtes Disease State Call.   Recent office visits:  No recent office Visits  Recent consult visits:  No recent consult Visits  Hospital visits:  None in previous 6 months  Medications: Outpatient Encounter Medications as of 01/20/2021  Medication Sig   albuterol (VENTOLIN HFA) 108 (90 Base) MCG/ACT inhaler Inhale 2 puffs into the lungs every 6 (six) hours as needed for wheezing or shortness of breath.   amLODipine-benazepril (LOTREL) 10-40 MG capsule Take 1 capsule by mouth daily.   Ascorbic Acid (VITAMIN C) 1000 MG tablet Take 1,000 mg by mouth daily.   atenolol (TENORMIN) 50 MG tablet Take 1 tablet (50 mg total) by mouth daily.   Blood Glucose Monitoring Suppl (CONTOUR NEXT USB MONITOR) w/Device KIT 1 kit by Does not apply route daily. To check blood sugar once daily.   CareTouch Safety Lancets 26G MISC 1 Device by Does not apply route daily. To check blood sugar once daily.   cetirizine (ZYRTEC) 10 MG tablet Take 10 mg by mouth daily.   Cholecalciferol (DIALYVITE VITAMIN D 5000 PO) Take 5,000 Units by mouth daily.   CINNAMON PO Take 350 mg by mouth daily.   dapagliflozin propanediol (FARXIGA) 10 MG TABS tablet Take 1 tablet (10 mg total) by mouth daily before breakfast.   glucose blood (CONTOUR NEXT TEST) test strip To check blood sugar once daily.   glyBURIDE (DIABETA) 2.5 MG tablet Take 1 tablet (2.5 mg total) by mouth daily with breakfast. (Patient taking differently: Take 5 mg by mouth daily with breakfast.)   Insulin Glargine (BASAGLAR KWIKPEN) 100 UNIT/ML Inject 40 Units into the skin daily.   Insulin Pen Needle (BD PEN NEEDLE NANO 2ND GEN) 32G X 4 MM MISC To use with Lantus injections.   Insulin Pen Needle (NOVOFINE PLUS) 32G X 4 MM MISC To use with ozempic injections   ipratropium-albuterol (DUONEB) 0.5-2.5 (3) MG/3ML SOLN Take 3 mLs by nebulization  every 6 (six) hours as needed. (Patient not taking: No sig reported)   rosuvastatin (CRESTOR) 5 MG tablet Take 1 tablet (5 mg total) by mouth daily.   sodium chloride 0.9 % nebulizer solution Take 3 mLs by nebulization as needed for wheezing. (Patient not taking: Reported on 11/21/2020)   TURMERIC CURCUMIN PO Take 2,000 mg by mouth daily.   ZINC GLUCONATE PO Take 11 mg by mouth daily. With Quercetin with 250 mg   No facility-administered encounter medications on file as of 01/20/2021.    Star Rating Drugs: Amlodipine-benazepril 10-40 mg last filled on 12/30/2020 for 90 day supply at [facility]. Farxiga 10 mg last filled on 11/10/2020 for 90 day supply at [facility]. Rosuvastatin 5 mg last filled 11/24/2020 for 90 day supply at [facility].  Recent Relevant Labs: Lab Results  Component Value Date/Time   HGBA1C 13.5 (H) 10/01/2020 02:35 PM   HGBA1C 11.2 (A) 01/31/2019 10:03 AM   HGBA1C 14.0 (A) 12/27/2018 07:19 AM   HGBA1C 13.7 (H) 12/15/2018 08:05 AM   MICROALBUR negative 12/15/2018 09:10 AM    Kidney Function Lab Results  Component Value Date/Time   CREATININE 0.52 10/13/2020 04:15 AM   CREATININE 0.54 10/12/2020 04:27 AM   GFRNONAA >60 10/13/2020 04:15 AM   GFRAA 60 10/01/2020 02:35 PM    Current antihyperglycemic regimen:  Farxiga 10 mg daily (Coupon Card)  Glyburide 2.5 mg daily  Lantus 40 units daily at night. What  recent interventions/DTPs have been made to improve glycemic control:  Patient set a goal of one day per week where she does not snack on candy or sugary foods. Discussed health snack options like peanut butter, appropriate servings of fruit, or cheese as alternative snacks Patient reports she is not taking Basaglar due too expensive. She was switched from Solvay to Lantus Have there been any recent hospitalizations or ED visits since last visit with CPP? No Patient denies hypoglycemic symptoms, including Pale, Sweaty, Shaky, Hungry,  Nervous/irritable and Vision changes  Patient reports hyperglycemic symptoms, including excessive thirst How often are you checking your blood sugar? once daily What are your blood sugars ranging?  Fasting:  On 01/20/2021 it was 171. On 01/19/2021 it was 146. On 01/18/2021 it was 142. On 01/17/2021 it was 202. On 01/16/2021 it was 242. On 01/15/2021 it was 126. On 01/14/2021 it was 146. Before meals: N/A After meals: N/A Bedtime: N/A During the week, how often does your blood glucose drop below 70? Never Are you checking your feet daily/regularly?   Patient reports numbness in her left foot at bedtime.  Per Clinical Pharmacist ,Speak with patient. Basaglar PAP approved?   Patient states she has not heard anything from [facility] as of 01/20/2021.   Patient set a goal of avoiding all sugary snacks one day per week. How has she been doing with her goal?  Patient states she has been following her goal and has not a a sugary snack one day out of the week.   Adherence Review: Is the patient currently on a STATIN medication? Yes Is the patient currently on ACE/ARB medication? Yes Does the patient have >5 day gap between last estimated fill dates? No  [place] Clinical Production designer, theatre/television/film [phone]

## 2021-02-02 ENCOUNTER — Telehealth: Payer: Self-pay

## 2021-02-02 NOTE — Progress Notes (Signed)
Left a voice message  to confirmed patient telephone appointment on 02/03/2021 for CCM at 2:00 pm with [name] the Clinical pharmacist.   [place] Pharmacist Assistant [phone]

## 2021-02-03 ENCOUNTER — Telehealth: Payer: Self-pay

## 2021-02-03 ENCOUNTER — Ambulatory Visit (INDEPENDENT_AMBULATORY_CARE_PROVIDER_SITE_OTHER): Payer: 59

## 2021-02-03 DIAGNOSIS — Z794 Long term (current) use of insulin: Secondary | ICD-10-CM

## 2021-02-03 DIAGNOSIS — E1159 Type 2 diabetes mellitus with other circulatory complications: Secondary | ICD-10-CM

## 2021-02-03 DIAGNOSIS — E785 Hyperlipidemia, unspecified: Secondary | ICD-10-CM | POA: Diagnosis not present

## 2021-02-03 DIAGNOSIS — E1169 Type 2 diabetes mellitus with other specified complication: Secondary | ICD-10-CM

## 2021-02-03 DIAGNOSIS — I152 Hypertension secondary to endocrine disorders: Secondary | ICD-10-CM

## 2021-02-03 DIAGNOSIS — E1165 Type 2 diabetes mellitus with hyperglycemia: Secondary | ICD-10-CM | POA: Diagnosis not present

## 2021-02-03 NOTE — Progress Notes (Signed)
Chronic Care Management Pharmacy Note  02/03/2021 Name:  Shari Lee MRN:  [id number] DOB:  1956/03/18  Subjective: Shari Lee is an 65 y.o. year old female who is a primary patient of Mar Daring, [place].  The CCM team was consulted for assistance with disease management and care coordination needs.    Engaged with patient by telephone for follow up visit in response to provider referral for pharmacy case management and/or care coordination services.   Consent to Services:  The patient was given information about Chronic Care Management services, agreed to services, and gave verbal consent prior to initiation of services.  Please see initial visit note for detailed documentation.   Patient Care Team: Rubye Beach as PCP - General (Family Medicine) Germaine Pomfret, [facility] (Pharmacist)  Recent office visits: 12/12/20: Patient presented to Fenton Malling, PA-C for follow-up. Amlodipine-Benazparil increased to 10-40 mg daily. Basaglar increased to 15 units nightly. Glyburide decreased to once daily.  11/10/20: Patient presented to Fenton Malling, PA-C for follow-up. BP in clinic 160/98. Synjardy stopped (Cost), glyburide and [name] started. Diarrhea with metformin  10/27/20: Video visit with Fenton Malling, PA-C for respiratory disease following Covid-19 infection. Patient started on Basaglar 10 units daily, Tussionex, DuoNeb, and NaCl Neb.   Recent consult visits: None in past 6 months.   Hospital visits: 10/06/20: Patient hospitalized for Covid-19 infection.   Objective:  Lab Results  Component Value Date   CREATININE 0.52 10/13/2020   BUN 16 10/13/2020   GFRNONAA >60 10/13/2020   GFRAA 60 10/01/2020   NA 134 (L) 10/13/2020   K 3.9 10/13/2020   CALCIUM 7.9 (L) 10/13/2020   CO2 29 10/13/2020   GLUCOSE 86 10/13/2020    Lab Results  Component Value Date/Time   HGBA1C 13.5 (H) 10/01/2020 02:35 PM   HGBA1C 11.2 (A) 01/31/2019 10:03 AM    HGBA1C 14.0 (A) 12/27/2018 07:19 AM   HGBA1C 13.7 (H) 12/15/2018 08:05 AM   MICROALBUR negative 12/15/2018 09:10 AM    Last diabetic Eye exam:  Lab Results  Component Value Date/Time   HMDIABEYEEXA No Retinopathy 07/31/2018 12:00 AM    Last diabetic Foot exam: No results found for: HMDIABFOOTEX   Lab Results  Component Value Date   CHOL 164 10/01/2020   HDL 48 10/01/2020   LDLCALC 99 10/01/2020   TRIG 89 10/01/2020   CHOLHDL 3.4 10/01/2020    Hepatic Function Latest Ref Rng & Units 10/12/2020 10/11/2020 10/10/2020  Total Protein 6.5 - 8.1 g/dL 5.3(L) 5.2(L) 5.9(L)  Albumin 3.5 - 5.0 g/dL 2.4(L) 2.4(L) 2.6(L)  AST 15 - 41 U/L 38 46(H) 47(H)  ALT 0 - 44 U/L 44 44 35  Alk Phosphatase 38 - 126 U/L 43 51 59  Total Bilirubin 0.3 - 1.2 mg/dL 0.7 0.7 0.9    Lab Results  Component Value Date/Time   TSH 2.410 10/01/2020 02:35 PM   TSH 2.090 12/15/2018 08:05 AM    CBC Latest Ref Rng & Units 10/13/2020 10/12/2020 10/11/2020  WBC 4.0 - 10.5 K/uL 11.3(H) 18.2(H) 15.9(H)  Hemoglobin 12.0 - 15.0 g/dL 12.3 12.3 12.0  Hematocrit 36.0 - 46.0 % 36.9 35.4(L) 35.8(L)  Platelets 150 - 400 K/uL 388 417(H) 410(H)    No results found for: VD25OH  Clinical ASCVD: No  The 10-year ASCVD risk score Mikey Bussing DC Jr., et al., 2013) is: 13.6%   Values used to calculate the score:     Age: 40 years     Sex: Female  Is Non-Hispanic African American: No     Diabetic: Yes     Tobacco smoker: No     Systolic Blood Pressure: 924 mmHg     Is BP treated: Yes     HDL Cholesterol: 48 mg/dL     Total Cholesterol: 164 mg/dL    Depression screen [facility] 2/9 10/27/2020 10/01/2020 01/03/2020  Decreased Interest 0 0 0  Down, Depressed, Hopeless 0 0 0  PHQ - 2 Score 0 0 0  Altered sleeping 0 - -  Tired, decreased energy 0 - -  Change in appetite 0 - -  Feeling bad or failure about yourself  0 - -  Trouble concentrating 0 - -  Moving slowly or fidgety/restless 0 - -  Suicidal thoughts 0 - -  PHQ-9 Score 0  - -  Difficult doing work/chores Not difficult at all - -     Social History   Tobacco Use  Smoking Status Never Smoker  Smokeless Tobacco Never Used   BP Readings from Last 3 Encounters:  12/12/20 (!) 137/56  11/10/20 (!) 160/98  10/27/20 (!) 152/73   Pulse Readings from Last 3 Encounters:  12/12/20 76  11/10/20 84  10/27/20 96   Wt Readings from Last 3 Encounters:  12/12/20 183 lb (83 kg)  11/10/20 182 lb (82.6 kg)  10/27/20 170 lb (77.1 kg)   BMI Readings from Last 3 Encounters:  12/12/20 33.47 kg/m  11/10/20 33.29 kg/m  10/27/20 31.09 kg/m    Assessment/Interventions: Review of patient past medical history, allergies, medications, health status, including review of consultants reports, laboratory and other test data, was performed as part of comprehensive evaluation and provision of chronic care management services.   SDOH:  (Social Determinants of Health) assessments and interventions performed: Yes SDOH Interventions   Flowsheet Row Most Recent Value  SDOH Interventions   Financial Strain Interventions Other (Comment)  [PAP]      CCM Care Plan  Allergies  Allergen Reactions  . Amoxicillin-Pot Clavulanate   . Levsin  [Hyoscyamine]     Other reaction(s): Dizzyness  . Penicillins   . Metformin Diarrhea  . Metformin And Related Diarrhea    Medications Reviewed Today    Reviewed by Germaine Pomfret, [facility] (Pharmacist) on 02/03/21 at 1428  Med List Status: <None>  Medication Order Taking? Sig Documenting Provider Last Dose Status Informant  albuterol (VENTOLIN HFA) 108 (90 Base) MCG/ACT inhaler [id number]  Inhale 2 puffs into the lungs every 6 (six) hours as needed for wheezing or shortness of breath. Nance Pear, MD  Active   amLODipine-benazepril (LOTREL) 10-40 MG capsule [id number] Yes Take 1 capsule by mouth daily. Mar Daring, PA-C Taking Active   Ascorbic Acid (VITAMIN C) 1000 MG tablet [id number]  Take 1,000 mg by mouth daily. [provider]  Active   atenolol (TENORMIN) 50 MG tablet [id number] Yes Take 1 tablet (50 mg total) by mouth daily. Mar Daring, PA-C Taking Active   Blood Glucose Monitoring Suppl (CONTOUR NEXT USB MONITOR) w/Device KIT [id number]  1 kit by Does not apply route daily. To check blood sugar once daily. Mar Daring, PA-C  Active   CareTouch Safety Lancets 26G MISC [id number]  1 Device by Does not apply route daily. To check blood sugar once daily. Fenton Malling M, PA-C  Active   cetirizine (ZYRTEC) 10 MG tablet [id number] Yes Take 10 mg by mouth daily. [provider] Taking Active   Cholecalciferol (DIALYVITE VITAMIN D 5000 PO) [id number] Yes  Take 5,000 Units by mouth daily. [provider] Taking Active   CINNAMON PO [id number] Yes Take 350 mg by mouth daily. [provider] Taking Active   dapagliflozin propanediol (FARXIGA) 10 MG TABS tablet [id number] Yes Take 1 tablet (10 mg total) by mouth daily before breakfast. Mar Daring, PA-C Taking Active   glucose blood (CONTOUR NEXT TEST) test strip [id number]  To check blood sugar once daily. Fenton Malling M, PA-C  Active   glyBURIDE (DIABETA) 2.5 MG tablet [id number] Yes Take 1 tablet (2.5 mg total) by mouth daily with breakfast. Mar Daring, PA-C Taking Active   Insulin Glargine (BASAGLAR KWIKPEN) 100 UNIT/ML [id number] Yes Inject 40 Units into the skin daily. Mar Daring, PA-C Taking Active   Insulin Pen Needle (BD PEN NEEDLE NANO 2ND GEN) 32G X 4 MM MISC [id number]  To use with Lantus injections. Fenton Malling M, PA-C  Active   Insulin Pen Needle (NOVOFINE PLUS) 32G X 4 MM MISC [id number]  To use with ozempic injections Fenton Malling M, PA-C  Active   ipratropium-albuterol (DUONEB) 0.5-2.5 (3) MG/3ML SOLN [id number]  Take 3 mLs by nebulization every 6 (six) hours as needed.  Patient not taking: No sig reported   Mar Daring, PA-C  Active   rosuvastatin (CRESTOR)  5 MG tablet [id number] No Take 1 tablet (5 mg total) by mouth daily.  Patient not taking: Reported on 02/03/2021   Mar Daring, PA-C Not Taking Active   sodium chloride 0.9 % nebulizer solution [id number]  Take 3 mLs by nebulization as needed for wheezing.  Patient not taking: Reported on 11/21/2020   Rubye Beach  Active   TURMERIC CURCUMIN PO [id number]  Take 2,000 mg by mouth daily. [provider]  Active   ZINC GLUCONATE PO [id number]  Take 11 mg by mouth daily. With Quercetin with 250 mg [provider]  Active           Patient Active Problem List   Diagnosis Date Noted  . Acute respiratory disease due to COVID-19 virus 10/06/2020  . 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 Diabetes mellitus type 2, uncontrolled ([place]) 04/29/2015  . History of digestive disease 04/29/2015  . 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 Hypercholesterolemia without hypertriglyceridemia 12/11/2009  . Apnea, sleep 07/30/2008  . Colon, diverticulosis 06/07/2008  . Essential (primary) hypertension 04/25/2006    Immunization History  Administered Date(s) Administered  . Influenza Inj Mdck Quad Pf 07/30/2019  . Influenza Inj Mdck Quad With Preservative 08/27/2018  . Influenza,inj,Quad PF,6+ Mos 07/28/2015  . Pneumococcal Conjugate-13 08/27/2018  . Td 08/26/2011  . Tdap 08/26/2011    Conditions to be addressed/monitored:  Hypertension, Hyperlipidemia and Diabetes  Care Plan : General Pharmacy (Adult)  Updates made by Germaine Pomfret, RPH since 02/03/2021 12:00 AM    Problem: Hypertension, Hyperlipidemia, Diabetes   Priority: High    Long-Range Goal: Patient-Specific Goal   Start Date: 11/21/2020  Expected End Date: 07/23/2021  This Visit's Progress: Not on track  Recent Progress: Not on track  Priority: High  Note:   Current Barriers:  . Unable to independently afford treatment regimen . Unable to achieve control of Diabetes    Pharmacist Clinical  Goal(s):  Marland Kitchen Over the next 90 days, patient will achieve control of diabetes as evidenced by A1c <8% through collaboration with PharmD and provider.   Interventions: . 1:1 collaboration with Mar Daring, PA-C regarding development and update of comprehensive plan of care as  evidenced by provider attestation and co-signature . Inter-disciplinary care team collaboration (see longitudinal plan of care) . Comprehensive medication review performed; medication list updated in electronic medical record  Hypertension (BP goal <130/80) -controlled -Current treatment: . Amlodipine-benazepril 5-20 mg daily (HS)  . Atenolol 50 mg daily (HS)  -Medications previously tried: NA  -Current home readings:  -Denies hypotensive/hypertensive symptoms -Educated on BP goals and benefits of medications for prevention of heart attack, stroke and kidney damage; Daily salt intake goal < 2300 mg; Exercise goal of 150 minutes per week; Importance of home blood pressure monitoring; -Counseled to monitor BP at home weekly, document, and provide log at future appointments -Recommended to continue current medication  Hyperlipidemia: (LDL goal < 70) -uncontrolled -Current treatment: . None -Medications previously tried: NA  -Patient did not pick up rosuvastatin prescription from pharmacy. -Educated on Benefits of statin for ASCVD risk reduction; Importance of limiting foods high in cholesterol;  Diabetes (A1c goal <8%) Diagnosed 2005  -uncontrolled -Current medications: . Farxiga 10 mg daily [employer])  . Glyburide 2.5 mg daily . Lantus 40 units daily  -Medications previously tried: Metformin (Diarrhea), Jardiance, Ozempic (ineffective)   -Current home glucose readings  Fasting Comments  19-Apr 156   18-Apr 234 (ate a larger dinner)  17-Apr 145   16-Apr 171   15-Apr 129   14-Apr 156   13-Apr 139   12-Apr 163   11-Apr 117   Average 157    -Denies hypoglycemic symptoms, but does report  polydipsia, polyuria  -Current meal patterns:  . breakfast: Sausage Biscuit  . lunch: Sandwich   . dinner: Mashed Potatoes  . snacks: Snacks on a lot of sweets/candies throughout the day . drinks: Diet soda. Drinks 4-5 glasses of water daily -Current exercise: Health visitor twice weekly: weights, resistance bands, walking 30 minutes. Limited by dyspnea following covid. - Extensive counseling on carbohydrate counting, appropriate food selection, and weight management given. Patient reports she has been following her goal of reducing her snack intake on one day weekly, but that it was quite challenging for her. Patient set a goal of two days per week where she does not snack on candy or sugary foods. Discussed health snack options like non-starchy vegetables, peanut butter, appropriate servings of fruit, or cheese as alternative snacks. Patient's confidence in the above plan was a 6/10.  -Recommended continuing current medications  -Recommend rechecking A1c, Microalbumin  -Assessed patient finances. Patient Assistance for Basaglar submitted on 12/16/20. Will call [employer] to request   Patient Goals/Self-Care Activities . Over the next 90 days, patient will:  - check glucose daily, document, and provide at future appointments check blood pressure weekly, document, and provide at future appointments target a minimum of 150 minutes of moderate intensity exercise weekly engage in dietary modifications by Minimizing sodium, carbohydrate, and fat intake.   Follow Up Plan: Telephone follow up appointment with care management team member scheduled for:  03/23/2021 at 3:00 PM      Medication Assistance: Application for Basaglar  medication assistance program. in process.  Anticipated assistance start date 12/30/20.  See plan of care for additional detail.  Patient's preferred pharmacy is:  [facility] [address]), [place] [address] [place] ([place]) [place] [address] Phone: [phone] Fax: [phone]  Uses pill box? Yes Pt endorses 100% compliance  We discussed: Current pharmacy is preferred with insurance plan and patient is satisfied with pharmacy services Patient decided to: Continue current medication  management strategy  Care Plan and Follow Up Patient Decision:  Patient agrees to Care Plan and Follow-up.  Plan: Telephone follow up appointment with care management team member scheduled for:  03/23/2021 at 3:00 PM  [place] [phone]

## 2021-02-03 NOTE — Patient Instructions (Signed)
Visit Information It was great speaking with you today!  Please let me know if you have any questions about our visit.  Goals Addressed            This Visit's Progress   . Monitor and Manage My Blood Sugar-Diabetes Type 2       Timeframe:  Long-Range Goal Priority:  High Start Date: 11/21/2020                            Expected End Date: 07/21/2021                      Follow Up Date 03/23/2021    - check blood sugar at prescribed times - check blood sugar if I feel it is too high or too low - enter blood sugar readings and medication or insulin into daily log - take the blood sugar log to all doctor visits    Why is this important?    Checking your blood sugar at home helps to keep it from getting very high or very low.   Writing the results in a diary or log helps the doctor know how to care for you.   Your blood sugar log should have the time, date and the results.   Also, write down the amount of insulin or other medicine that you take.   Other information, like what you ate, exercise done and how you were feeling, will also be helpful.         Patient Care Plan: General Pharmacy (Adult)    Problem Identified: Hypertension, Hyperlipidemia, Diabetes   Priority: High    Long-Range Goal: Patient-Specific Goal   Start Date: 11/21/2020  Expected End Date: 07/23/2021  This Visit's Progress: Not on track  Recent Progress: Not on track  Priority: High  Note:   Current Barriers:  . Unable to independently afford treatment regimen . Unable to achieve control of Diabetes    Pharmacist Clinical Goal(s):  Marland Kitchen Over the next 90 days, patient will achieve control of diabetes as evidenced by A1c <8% through collaboration with PharmD and provider.   Interventions: . 1:1 collaboration with Mar Daring, PA-C regarding development and update of comprehensive plan of care as evidenced by provider attestation and co-signature . Inter-disciplinary care team collaboration (see  longitudinal plan of care) . Comprehensive medication review performed; medication list updated in electronic medical record  Hypertension (BP goal <130/80) -controlled -Current treatment: . Amlodipine-benazepril 5-20 mg daily (HS)  . Atenolol 50 mg daily (HS)  -Medications previously tried: NA  -Current home readings:  -Denies hypotensive/hypertensive symptoms -Educated on BP goals and benefits of medications for prevention of heart attack, stroke and kidney damage; Daily salt intake goal < 2300 mg; Exercise goal of 150 minutes per week; Importance of home blood pressure monitoring; -Counseled to monitor BP at home weekly, document, and provide log at future appointments -Recommended to continue current medication  Hyperlipidemia: (LDL goal < 70) -uncontrolled -Current treatment: . None -Medications previously tried: NA  -Patient did not pick up rosuvastatin prescription from pharmacy. -Educated on Benefits of statin for ASCVD risk reduction; Importance of limiting foods high in cholesterol;  Diabetes (A1c goal <8%) Diagnosed 2005  -uncontrolled -Current medications: . Farxiga 10 mg daily [employer])  . Glyburide 2.5 mg daily . Lantus 40 units daily  -Medications previously tried: Metformin (Diarrhea), Jardiance, Ozempic (ineffective)   -Current home glucose readings  Fasting Comments  19-Apr 156   18-Apr 234 (ate a larger dinner)  17-Apr 145   16-Apr 171   15-Apr 129   14-Apr 156   13-Apr 139   12-Apr 163   11-Apr 117   Average 157    -Denies hypoglycemic symptoms, but does report polydipsia, polyuria  -Current meal patterns:  . breakfast: Sausage Biscuit  . lunch: Sandwich   . dinner: Mashed Potatoes  . snacks: Snacks on a lot of sweets/candies throughout the day . drinks: Diet soda. Drinks 4-5 glasses of water daily -Current exercise: Health visitor twice weekly: weights, resistance bands, walking 30 minutes. Limited by dyspnea following covid. -  Extensive counseling on carbohydrate counting, appropriate food selection, and weight management given. Patient reports she has been following her goal of reducing her snack intake on one day weekly, but that it was quite challenging for her. Patient set a goal of two days per week where she does not snack on candy or sugary foods. Discussed health snack options like non-starchy vegetables, peanut butter, appropriate servings of fruit, or cheese as alternative snacks. Patient's confidence in the above plan was a 6/10.  -Recommended continuing current medications  -Recommend rechecking A1c, Microalbumin  -Assessed patient finances. Patient Assistance for Basaglar submitted on 12/16/20. Will call [employer] to request   Patient Goals/Self-Care Activities . Over the next 90 days, patient will:  - check glucose daily, document, and provide at future appointments check blood pressure weekly, document, and provide at future appointments target a minimum of 150 minutes of moderate intensity exercise weekly engage in dietary modifications by Minimizing sodium, carbohydrate, and fat intake.   Follow Up Plan: Telephone follow up appointment with care management team member scheduled for:  03/23/2021 at 3:00 PM    Patient agreed to services and verbal consent obtained.   The patient verbalized understanding of instructions, educational materials, and care plan provided today and declined offer to receive copy of patient instructions, educational materials, and care plan.   [name], PharmD, [place] [phone]

## 2021-02-04 NOTE — Chronic Care Management (AMB) (Signed)
02/03/2021- Called [employer], spoke with [employer], he did not see patient's application on file, would need to speak with [employer] patient assistance department to get further information. Transferred to [employer], spoke with Americles, she was able to locate patients application and sees notes where I called 01/02/2021:  01/02/2021- [facility] [place] to follow up on patient assistance medication Basaglar. Spoke with Ava with [facility], she was unable to find application, was transferred to [facility] with [employer] patient assistance program, application needed updated information on provider portion, address, phone number, provider, but then she noticed that the updated information on application was received and will get application processed. Processing takes 10-14 days then if patient is approved they will get prescription over to the pharmacy to deliver to patient. Called patient to give updates. Patient is aware, she is also aware of coupon at PCP office available for patient to pick up to use at the pharmacy to get Senath for $25 for month supply.    Americles was able to fix what was not done and patient application was approved and submitted to [place] for expedite fill and delivery. She advised that patient should call [facility] [phone] on Friday from 8am - 6 pm to ensure they are filling and to schedule delivery of her choice.  02/04/2021- Left message for patient to return call.   02/05/2021- Called patient, she is aware of call from [facility] patient assistance program. She will contact them tomorrow morning to schedule delivery. Patient was unavailable to write number down, [facility] number sent to patient by text message.  [name], CPP.  Pattricia Boss, [place] Pharmacist Assistant [phone]

## 2021-02-13 ENCOUNTER — Telehealth: Payer: Self-pay

## 2021-02-13 NOTE — Progress Notes (Signed)
    Chronic Care Management Pharmacy Assistant   Name: Charnika Herbst  MRN: [id number] DOB: 07-Jun-1956  Reason for Encounter: Medication [facility] visits:  None in previous 6 months  Medications: Outpatient Encounter Medications as of 02/13/2021  Medication Sig  . albuterol (VENTOLIN HFA) 108 (90 Base) MCG/ACT inhaler Inhale 2 puffs into the lungs every 6 (six) hours as needed for wheezing or shortness of breath.  Marland Kitchen amLODipine-benazepril (LOTREL) 10-40 MG capsule Take 1 capsule by mouth daily.  . Ascorbic Acid (VITAMIN C) 1000 MG tablet Take 1,000 mg by mouth daily.  Marland Kitchen atenolol (TENORMIN) 50 MG tablet Take 1 tablet (50 mg total) by mouth daily.  . Blood Glucose Monitoring Suppl (CONTOUR NEXT USB MONITOR) w/Device KIT 1 kit by Does not apply route daily. To check blood sugar once daily.  Angelia Mould Safety Lancets 26G MISC 1 Device by Does not apply route daily. To check blood sugar once daily.  . cetirizine (ZYRTEC) 10 MG tablet Take 10 mg by mouth daily.  . Cholecalciferol (DIALYVITE VITAMIN D 5000 PO) Take 5,000 Units by mouth daily.  Marland Kitchen CINNAMON PO Take 350 mg by mouth daily.  . dapagliflozin propanediol (FARXIGA) 10 MG TABS tablet Take 1 tablet (10 mg total) by mouth daily before breakfast.  . glucose blood (CONTOUR NEXT TEST) test strip To check blood sugar once daily.  Marland Kitchen glyBURIDE (DIABETA) 2.5 MG tablet Take 1 tablet (2.5 mg total) by mouth daily with breakfast.  . Insulin Glargine (BASAGLAR KWIKPEN) 100 UNIT/ML Inject 40 Units into the skin daily.  . Insulin Pen Needle (BD PEN NEEDLE NANO 2ND GEN) 32G X 4 MM MISC To use with Lantus injections.  . Insulin Pen Needle (NOVOFINE PLUS) 32G X 4 MM MISC To use with ozempic injections  . ipratropium-albuterol (DUONEB) 0.5-2.5 (3) MG/3ML SOLN Take 3 mLs by nebulization every 6 (six) hours as needed. (Patient not taking: No sig reported)  . rosuvastatin (CRESTOR) 5 MG tablet Take 1 tablet (5 mg total) by mouth daily. (Patient not  taking: Reported on 02/03/2021)  . sodium chloride 0.9 % nebulizer solution Take 3 mLs by nebulization as needed for wheezing. (Patient not taking: Reported on 11/21/2020)  . TURMERIC CURCUMIN PO Take 2,000 mg by mouth daily.  Marland Kitchen ZINC GLUCONATE PO Take 11 mg by mouth daily. With Quercetin with 250 mg   No facility-administered encounter medications on file as of 02/13/2021.   Star rating: Rosuvastatin 5 mg last filled on 11/24/2020 for 90 day supply at [facility].  Performed cost analysis for patient, estimated yearly medication cost around $120.00  [place] Pharmacist Assistant [phone]

## 2021-03-09 ENCOUNTER — Ambulatory Visit: Payer: Self-pay | Admitting: Family Medicine

## 2021-03-13 ENCOUNTER — Telehealth: Payer: Self-pay | Admitting: Physician Assistant

## 2021-03-13 DIAGNOSIS — J0100 Acute maxillary sinusitis, unspecified: Secondary | ICD-10-CM

## 2021-03-13 MED ORDER — AZITHROMYCIN 250 MG PO TABS: ORAL_TABLET | ORAL | 0 refills | Status: AC

## 2021-03-13 NOTE — Telephone Encounter (Signed)
Having cough with PND and sinus pressure the past week. COVID tests have been negative twice. No fever but some sore throat. May proceed with Tussin for cough control, Mucinex-D for congestion and add an antibiotic. Increase fluid intake and recheck if no better in 4-5 days. May use Albuterol inhaler you have at home if any wheezing.

## 2021-03-13 NOTE — Telephone Encounter (Signed)
Pt is calling to request a appt for a cough & congestion, ears itching, with negative covid test. Pt states she she has not slept in 2 days bc the cough keeps her up at night. Please advise Cb- [phone]

## 2021-03-13 NOTE — Telephone Encounter (Signed)
Please advise 

## 2021-03-20 ENCOUNTER — Telehealth: Payer: Self-pay

## 2021-03-20 NOTE — Chronic Care Management (AMB) (Signed)
    Chronic Care Management Pharmacy Assistant   Name: Shari Lee  MRN: [id number] DOB: 04-08-1956  03/20/2021- Patient called to remind of appointment with [name], CPP on 03/23/2021 at 3:00 pm.  Patient aware of appointment date, time, and type of appointment (either telephone or in person). Patient aware to have/bring all medications, supplements, blood pressure and/or blood sugar logs to visit.  Questions: Have you had any recent office visit or specialist visit outside of [place]?Patient denies seeing any other providers outside [place] recently.  Are there any concerns you would like to discuss during your office visit? Patient has no concerns.   Are you having any problems obtaining your medications? (Whether it pharmacy issues or cost)- Patient does not have any problems obtaining medications.  If patient has any PAP medications ask if they are having any problems getting their PAP medication or refill?- Patient is concerned she will run out of her Basaglar insulin pen due to them sending only 7 pens using 10 units daily. Patient states she is taking 40 units daily and will run out before next refill is sent. Patient aware I will fill out form for PCP to sign with updated medication dosage and send to [name], CPP so we can get this corrected prior to patient running out of medication.  Star Rating Drug: Farxiga 10 mg- Last filled 02/06/2021 for 90 day supply at [facility] 25 mg- Last filled 10/08/2018 for 90 day supply at [facility] Rosuvastatin 5 mg- Last filled 11/24/2020 for 90 day supply at [place] filled 10/03/2019 for 28 day supply at [facility] Glyburide 2.5 mg- Last filled 02/06/2021 for 90 day supply at [facility]   Any gaps in medications fill history? Rosuvastatin 5 mg- Last filled 11/24/2020 for 90 day supply at [facility]- Patient reports no longer taking. Ozempic- Last filled 10/03/2019 for 28 day supply at [facility]- Patient reports  no longer taking.   Patient Assistance Coordination: Patient assistance application for [employer] with [employer] Patient assistance program updated with correct dosage and sent to [name], CPP to print and PCP to sign.   SIG: Shari Lee, [place] Pharmacist Assistant [phone]

## 2021-03-23 ENCOUNTER — Ambulatory Visit: Payer: 59

## 2021-03-23 DIAGNOSIS — E1169 Type 2 diabetes mellitus with other specified complication: Secondary | ICD-10-CM

## 2021-03-23 DIAGNOSIS — E1165 Type 2 diabetes mellitus with hyperglycemia: Secondary | ICD-10-CM

## 2021-03-23 DIAGNOSIS — G4700 Insomnia, unspecified: Secondary | ICD-10-CM

## 2021-03-23 DIAGNOSIS — E1159 Type 2 diabetes mellitus with other circulatory complications: Secondary | ICD-10-CM

## 2021-03-23 DIAGNOSIS — E785 Hyperlipidemia, unspecified: Secondary | ICD-10-CM

## 2021-03-23 MED ORDER — ROSUVASTATIN CALCIUM 5 MG PO TABS: 5.0000 mg | ORAL_TABLET | Freq: Every day | ORAL | 3 refills | Status: DC

## 2021-03-23 NOTE — Progress Notes (Addendum)
Chronic Care Management Pharmacy Note  03/25/2021 Name:  Shari Lee MRN:  [id number] DOB:  1956-09-25   Summary: Patient reports she recently had a sinus Infection which she is still recovering from. Her blood pressure has been elevated recently because of this. Her blood sugars are actually improved, likely due to decreased appetite. She will need a refill of her Basaglar sent into [facility] today.  Recommendations/Changes made from today's visit: Increase monitoring blood pressure to every other day given recent home elevations   Plan: Continue current medications Patient is non-eligible patient, disenrolling her from CCM services.    Subjective: Shari Lee is an 65 y.o. year old female who is a primary patient of Mar Daring, [place].  The CCM team was consulted for assistance with disease management and care coordination needs.    Engaged with patient by telephone for follow up visit in response to provider referral for pharmacy case management and/or care coordination services.   Consent to Services:  The patient was given information about Chronic Care Management services, agreed to services, and gave verbal consent prior to initiation of services.  Please see initial visit note for detailed documentation.   Patient Care Team: Rubye Beach as PCP - General (Family Medicine) Germaine Pomfret, [facility] (Pharmacist)  Recent office visits: 12/12/20: Patient presented to Fenton Malling, PA-C for follow-up. Amlodipine-Benazparil increased to 10-40 mg daily. Basaglar increased to 15 units nightly. Glyburide decreased to once daily.  11/10/20: Patient presented to Fenton Malling, PA-C for follow-up. BP in clinic 160/98. Synjardy stopped (Cost), glyburide and [name] started. Diarrhea with metformin  10/27/20: Video visit with Fenton Malling, PA-C for respiratory disease following Covid-19 infection. Patient started on Basaglar 10 units daily,  Tussionex, DuoNeb, and NaCl Neb.   Recent consult visits: None in past 6 months.   Hospital visits: 10/06/20: Patient hospitalized for Covid-19 infection.   Objective:  Lab Results  Component Value Date   CREATININE 0.52 10/13/2020   BUN 16 10/13/2020   GFRNONAA >60 10/13/2020   GFRAA 60 10/01/2020   NA 134 (L) 10/13/2020   K 3.9 10/13/2020   CALCIUM 7.9 (L) 10/13/2020   CO2 29 10/13/2020   GLUCOSE 86 10/13/2020    Lab Results  Component Value Date/Time   HGBA1C 13.5 (H) 10/01/2020 02:35 PM   HGBA1C 11.2 (A) 01/31/2019 10:03 AM   HGBA1C 14.0 (A) 12/27/2018 07:19 AM   HGBA1C 13.7 (H) 12/15/2018 08:05 AM   MICROALBUR negative 12/15/2018 09:10 AM    Last diabetic Eye exam:  Lab Results  Component Value Date/Time   HMDIABEYEEXA No Retinopathy 07/31/2018 12:00 AM    Last diabetic Foot exam: No results found for: HMDIABFOOTEX   Lab Results  Component Value Date   CHOL 164 10/01/2020   HDL 48 10/01/2020   LDLCALC 99 10/01/2020   TRIG 89 10/01/2020   CHOLHDL 3.4 10/01/2020    Hepatic Function Latest Ref Rng & Units 10/12/2020 10/11/2020 10/10/2020  Total Protein 6.5 - 8.1 g/dL 5.3(L) 5.2(L) 5.9(L)  Albumin 3.5 - 5.0 g/dL 2.4(L) 2.4(L) 2.6(L)  AST 15 - 41 U/L 38 46(H) 47(H)  ALT 0 - 44 U/L 44 44 35  Alk Phosphatase 38 - 126 U/L 43 51 59  Total Bilirubin 0.3 - 1.2 mg/dL 0.7 0.7 0.9    Lab Results  Component Value Date/Time   TSH 2.410 10/01/2020 02:35 PM   TSH 2.090 12/15/2018 08:05 AM    CBC Latest Ref Rng & Units 10/13/2020 10/12/2020 10/11/2020  WBC 4.0 - 10.5 K/uL 11.3(H) 18.2(H) 15.9(H)  Hemoglobin 12.0 - 15.0 g/dL 12.3 12.3 12.0  Hematocrit 36.0 - 46.0 % 36.9 35.4(L) 35.8(L)  Platelets 150 - 400 K/uL 388 417(H) 410(H)    No results found for: VD25OH  Clinical ASCVD: No  The 10-year ASCVD risk score Mikey Bussing DC Jr., et al., 2013) is: 13.6%   Values used to calculate the score:     Age: 38 years     Sex: Female     Is Non-Hispanic African American:  No     Diabetic: Yes     Tobacco smoker: No     Systolic Blood Pressure: 841 mmHg     Is BP treated: Yes     HDL Cholesterol: 48 mg/dL     Total Cholesterol: 164 mg/dL    Depression screen [facility] 2/9 10/27/2020 10/01/2020 01/03/2020  Decreased Interest 0 0 0  Down, Depressed, Hopeless 0 0 0  PHQ - 2 Score 0 0 0  Altered sleeping 0 - -  Tired, decreased energy 0 - -  Change in appetite 0 - -  Feeling bad or failure about yourself  0 - -  Trouble concentrating 0 - -  Moving slowly or fidgety/restless 0 - -  Suicidal thoughts 0 - -  PHQ-9 Score 0 - -  Difficult doing work/chores Not difficult at all - -     Social History   Tobacco Use  Smoking Status Never Smoker  Smokeless Tobacco Never Used   BP Readings from Last 3 Encounters:  12/12/20 (!) 137/56  11/10/20 (!) 160/98  10/27/20 (!) 152/73   Pulse Readings from Last 3 Encounters:  12/12/20 76  11/10/20 84  10/27/20 96   Wt Readings from Last 3 Encounters:  12/12/20 183 lb (83 kg)  11/10/20 182 lb (82.6 kg)  10/27/20 170 lb (77.1 kg)   BMI Readings from Last 3 Encounters:  12/12/20 33.47 kg/m  11/10/20 33.29 kg/m  10/27/20 31.09 kg/m    Assessment/Interventions: Review of patient past medical history, allergies, medications, health status, including review of consultants reports, laboratory and other test data, was performed as part of comprehensive evaluation and provision of chronic care management services.   SDOH:  (Social Determinants of Health) assessments and interventions performed: Yes SDOH Interventions   Flowsheet Row Most Recent Value  SDOH Interventions   Financial Strain Interventions Other (Comment)  [PAP]      CCM Care Plan  Allergies  Allergen Reactions  . Amoxicillin-Pot Clavulanate   . Levsin  [Hyoscyamine]     Other reaction(s): Dizzyness  . Penicillins   . Metformin Diarrhea  . Metformin And Related Diarrhea    Medications Reviewed Today    Reviewed by Germaine Pomfret, [facility]  (Pharmacist) on 03/23/21 at 1523  Med List Status: <None>  Medication Order Taking? Sig Documenting Provider Last Dose Status Informant  albuterol (VENTOLIN HFA) 108 (90 Base) MCG/ACT inhaler [id number]  Inhale 2 puffs into the lungs every 6 (six) hours as needed for wheezing or shortness of breath. Nance Pear, MD  Active   amLODipine-benazepril (LOTREL) 10-40 MG capsule [id number]  Take 1 capsule by mouth daily. Fenton Malling M, PA-C  Active   Ascorbic Acid (VITAMIN C) 1000 MG tablet [id number]  Take 1,000 mg by mouth daily. [provider]  Active   aspirin EC 81 MG tablet [id number] Yes Take 81 mg by mouth daily. Swallow whole. [provider] Taking Active   atenolol (TENORMIN) 50 MG tablet [id number]  Take 1 tablet (50 mg total) by mouth daily. Mar Daring, PA-C  Active   Blood Glucose Monitoring Suppl (CONTOUR NEXT USB MONITOR) w/Device KIT [id number]  1 kit by Does not apply route daily. To check blood sugar once daily. Mar Daring, PA-C  Active   CareTouch Safety Lancets 26G MISC [id number]  1 Device by Does not apply route daily. To check blood sugar once daily. Fenton Malling M, PA-C  Active   cetirizine (ZYRTEC) 10 MG tablet [id number]  Take 10 mg by mouth daily. [provider]  Active   Cholecalciferol (DIALYVITE VITAMIN D 5000 PO) [id number]  Take 5,000 Units by mouth daily. [provider]  Active   CINNAMON PO [id number]  Take 350 mg by mouth daily. [provider]  Active   dapagliflozin propanediol (FARXIGA) 10 MG TABS tablet [id number]  Take 1 tablet (10 mg total) by mouth daily before breakfast. Mar Daring, PA-C  Active   glucose blood (CONTOUR NEXT TEST) test strip [id number]  To check blood sugar once daily. Fenton Malling M, PA-C  Active   glyBURIDE (DIABETA) 2.5 MG tablet [id number]  Take 1 tablet (2.5 mg total) by mouth daily with breakfast. Mar Daring, PA-C  Active   Insulin Glargine  [facility] KWIKPEN) 100 UNIT/ML [id number]  Inject 40 Units into the skin daily. Mar Daring, PA-C  Active   Insulin Pen Needle (BD PEN NEEDLE NANO 2ND GEN) 32G X 4 MM MISC [id number]  To use with Lantus injections. Fenton Malling M, PA-C  Active   Insulin Pen Needle (NOVOFINE PLUS) 32G X 4 MM MISC [id number]  To use with ozempic injections Mar Daring, [place]  Active   rosuvastatin (CRESTOR) 5 MG tablet [id number]  Take 1 tablet (5 mg total) by mouth daily. Virginia Crews, MD  Active   TURMERIC CURCUMIN PO [id number]  Take 2,000 mg by mouth daily. [provider]  Active   ZINC GLUCONATE PO [id number]  Take 11 mg by mouth daily. With Quercetin with 250 mg [provider]  Active           Patient Active Problem List   Diagnosis Date Noted  . Acute respiratory disease due to COVID-19 virus 10/06/2020  . 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 Diabetes mellitus type 2, uncontrolled ([place]) 04/29/2015  . History of digestive disease 04/29/2015  . 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 Hypercholesterolemia without hypertriglyceridemia 12/11/2009  . Apnea, sleep 07/30/2008  . Colon, diverticulosis 06/07/2008  . Essential (primary) hypertension 04/25/2006    Immunization History  Administered Date(s) Administered  . Influenza Inj Mdck Quad Pf 07/30/2019  . Influenza Inj Mdck Quad With Preservative 08/27/2018  . Influenza,inj,Quad PF,6+ Mos 07/28/2015  . Pneumococcal Conjugate-13 08/27/2018  . Td 08/26/2011  . Tdap 08/26/2011    Conditions to be addressed/monitored:  Hypertension, Hyperlipidemia and Diabetes  Care Plan : General Pharmacy (Adult)  Updates made by Germaine Pomfret, RPH since 03/25/2021 12:00 AM    Problem: Hypertension, Hyperlipidemia, Diabetes   Priority: High    Long-Range Goal: Patient-Specific Goal   Start Date: 11/21/2020  Expected End Date: 07/23/2021  This Visit's Progress: On track  Recent Progress: Not on track   Priority: High  Note:   Current Barriers:  . Unable to independently afford treatment regimen . Unable to achieve control of Diabetes    Pharmacist Clinical Goal(s):  Marland Kitchen Over the next 90 days, patient will achieve control of diabetes as evidenced by A1c <8%  through collaboration with PharmD and provider.   Interventions: . 1:1 collaboration with Mar Daring, PA-C regarding development and update of comprehensive plan of care as evidenced by provider attestation and co-signature . Inter-disciplinary care team collaboration (see longitudinal plan of care) . Comprehensive medication review performed; medication list updated in electronic medical record  Hypertension (BP goal <130/80) -Uncontrolled based on current home readings  -Current treatment: . Amlodipine-benazepril 5-20 mg daily  . Atenolol 50 mg daily -Medications previously tried: NA  -Current home readings: 195/105, 155/90 on repeat  -Denies hypotensive/hypertensive symptoms -Blood pressure elevated, patient thinks recent illness and stress are contributing factors.  -Counseled to monitor BP at home three times weekly, document, and provide log at future appointments -Recommended to continue current medication  Hyperlipidemia: (LDL goal < 70) -uncontrolled -Current treatment: . None -Medications previously tried: NA  -Patient did not pick up rosuvastatin prescription from pharmacy. -Educated on Benefits of statin for ASCVD risk reduction; RESTART Rosuvastatin 5 mg daily. New Rx sent into patient pharmacy   Diabetes (A1c goal <8%) Diagnosed 2005  -uncontrolled -Current medications: . Farxiga 10 mg daily [employer])  . Glyburide 2.5 mg daily . Lantus 40 units daily  -Medications previously tried: Metformin (Diarrhea), Jardiance, Ozempic (ineffective)   -Current home glucose readings Date Fasting  Comment  6-Jun 155   5-Jun 145   4-Jun 175 Forgot Medications  3-Jun 179   2-Jun 157   1-Jun 126    31-May 148   30-May 115   29-May 151   28-May 129   Average 148    -Denies hypoglycemic symptoms, but does report polydipsia, polyuria  -Current meal patterns:  . breakfast: Sausage Biscuit  . lunch: Sandwich   . dinner: Mashed Potatoes  . snacks: Snacks on a lot of sweets/candies throughout the day . drinks: Diet soda. Drinks 4-5 glasses of water daily -Current exercise: Health visitor twice weekly: weights, resistance bands, walking 30 minutes. Limited by dyspnea following covid. -Patient needs refill request for Bas[facility]lar [facility] into [facility] given her prescribed dose has increased. Printed and sent to PCP for signature.  -Continue current medications until PCP follow-up. Ancipitate retrial of GLP-1 agonist (with coupon card/PAP) at future visit.  Insomnia (Goal: Improve sleep quality) -Uncontrolled -Current treatment  . None  -Medications previously tried: Melatonin (ineffective)  -Patient reports poor sleep quality. She typically gets 3-4 hours of sleep nightly and wakes up 2-3 times nightly . She states she does not wake up due to a need to urinate, but will always urinate when she wakes up. When she wakes up it typically takes her 30-60 minutes to fall back asleep. Unclear if there is a nocturia component to her insomnia. She also reports fatigue throughout the day. -Counseled patient on proper sleep hygiene habits   Patient Goals/Self-Care Activities . Over the next 90 days, patient will:  - check glucose daily, document, and provide at future appointments check blood pressure weekly, document, and provide at future appointments target a minimum of 150 minutes of moderate intensity exercise weekly engage in dietary modifications by Minimizing sodium, carbohydrate, and fat intake.   Follow Up Plan: Telephone follow up appointment with care management team member scheduled for:  05/19/2021 at 3:00 PM      Medication Assistance: Basaglar obtained through [employer]  medication assistance program.  Enrollment ends Dec 2022  Patient's preferred pharmacy is:  [facility] (N), [place] - [address] [place] ([place])  [place] [address] Phone: [phone] Fax: [phone]  Uses pill box? Yes Pt endorses 90% compliance  We discussed: Current pharmacy is preferred with insurance plan and patient is satisfied with pharmacy services Patient decided to: Continue current medication management strategy  Care Plan and Follow Up Patient Decision:  Patient agrees to Care Plan and Follow-up.  Plan: Telephone follow up appointment with care management team member scheduled for:  05/19/2021 at 3:00 PM  [place] [phone]

## 2021-03-25 NOTE — Patient Instructions (Signed)
Visit Information It was great speaking with you today!  Please let me know if you have any questions about our visit.  Goals Addressed            This Visit's Progress   . Monitor and Manage My Blood Sugar-Diabetes Type 2   On track    Timeframe:  Long-Range Goal Priority:  High Start Date: 11/21/2020                            Expected End Date: 07/21/2021                      Follow Up Date 05/19/2021    - check blood sugar at prescribed times - check blood sugar if I feel it is too high or too low - enter blood sugar readings and medication or insulin into daily log - take the blood sugar log to all doctor visits    Why is this important?    Checking your blood sugar at home helps to keep it from getting very high or very low.   Writing the results in a diary or log helps the doctor know how to care for you.   Your blood sugar log should have the time, date and the results.   Also, write down the amount of insulin or other medicine that you take.   Other information, like what you ate, exercise done and how you were feeling, will also be helpful.      . Track and Manage My Blood Pressure-Hypertension       Timeframe:  Long-Range Goal Priority:  High Start Date: 03/25/2021                             Expected End Date: 09/24/2021                      Follow Up Date 04/17/2021    - check blood pressure 3 times per week    Why is this important?    You won't feel high blood pressure, but it can still hurt your blood vessels.   High blood pressure can cause heart or kidney problems. It can also cause a stroke.   Making lifestyle changes like losing a little weight or eating less salt will help.   Checking your blood pressure at home and at different times of the day can help to control blood pressure.   If the doctor prescribes medicine remember to take it the way the doctor ordered.   Call the office if you cannot afford the medicine or if there are questions about it.      Notes:        Patient Care Plan: General Pharmacy (Adult)    Problem Identified: Hypertension, Hyperlipidemia, Diabetes   Priority: High    Long-Range Goal: Patient-Specific Goal   Start Date: 11/21/2020  Expected End Date: 07/23/2021  This Visit's Progress: On track  Recent Progress: Not on track  Priority: High  Note:   Current Barriers:  . Unable to independently afford treatment regimen . Unable to achieve control of Diabetes    Pharmacist Clinical Goal(s):  Marland Kitchen Over the next 90 days, patient will achieve control of diabetes as evidenced by A1c <8% through collaboration with PharmD and provider.   Interventions: . 1:1 collaboration with Mar Daring, PA-C regarding development and update  of comprehensive plan of care as evidenced by provider attestation and co-signature . Inter-disciplinary care team collaboration (see longitudinal plan of care) . Comprehensive medication review performed; medication list updated in electronic medical record  Hypertension (BP goal <130/80) -Uncontrolled based on current home readings  -Current treatment: . Amlodipine-benazepril 5-20 mg daily  . Atenolol 50 mg daily -Medications previously tried: NA  -Current home readings: 195/105, 155/90 on repeat  -Denies hypotensive/hypertensive symptoms -Blood pressure elevated, patient thinks recent illness and stress are contributing factors.  -Counseled to monitor BP at home three times weekly, document, and provide log at future appointments -Recommended to continue current medication  Hyperlipidemia: (LDL goal < 70) -uncontrolled -Current treatment: . None -Medications previously tried: NA  -Patient did not pick up rosuvastatin prescription from pharmacy. -Educated on Benefits of statin for ASCVD risk reduction; RESTART Rosuvastatin 5 mg daily. New Rx sent into patient pharmacy   Diabetes (A1c goal <8%) Diagnosed 2005  -uncontrolled -Current medications: . Farxiga 10 mg daily  [employer])  . Glyburide 2.5 mg daily . Lantus 40 units daily  -Medications previously tried: Metformin (Diarrhea), Jardiance, Ozempic (ineffective)   -Current home glucose readings Date Fasting  Comment  6-Jun 155   5-Jun 145   4-Jun 175 Forgot Medications  3-Jun 179   2-Jun 157   1-Jun 126   31-May 148   30-May 115   29-May 151   28-May 129   Average 148    -Denies hypoglycemic symptoms, but does report polydipsia, polyuria  -Current meal patterns:  . breakfast: Sausage Biscuit  . lunch: Sandwich   . dinner: Mashed Potatoes  . snacks: Snacks on a lot of sweets/candies throughout the day . drinks: Diet soda. Drinks 4-5 glasses of water daily -Current exercise: Health visitor twice weekly: weights, resistance bands, walking 30 minutes. Limited by dyspnea following covid. -Patient needs refill request for Basaglar sent into [facility] given her prescribed dose has increased. Printed and sent to PCP for signature.  -Continue current medications until PCP follow-up. Ancipitate retrial of GLP-1 agonist (with coupon card/PAP) at future visit.  Insomnia (Goal: Improve sleep quality) -Uncontrolled -Current treatment  . None  -Medications previously tried: Melatonin (ineffective)  -Patient reports poor sleep quality. She typically gets 3-4 hours of sleep nightly and wakes up 2-3 times nightly . She states she does not wake up due to a need to urinate, but will always urinate when she wakes up. When she wakes up it typically takes her 30-60 minutes to fall back asleep. Unclear if there is a nocturia component to her insomnia. She also reports fatigue throughout the day. -Counseled patient on proper sleep hygiene habits   Patient Goals/Self-Care Activities . Over the next 90 days, patient will:  - check glucose daily, document, and provide at future appointments check blood pressure weekly, document, and provide at future appointments target a minimum of 150 minutes of  moderate intensity exercise weekly engage in dietary modifications by Minimizing sodium, carbohydrate, and fat intake.   Follow Up Plan: Telephone follow up appointment with care management team member scheduled for:  05/19/2021 at 3:00 PM      Patient agreed to services and verbal consent obtained.   The patient verbalized understanding of instructions, educational materials, and care plan provided today and declined offer to receive copy of patient instructions, educational materials, and care plan.   [name], PharmD, [place] [phone]

## 2021-04-10 ENCOUNTER — Telehealth: Payer: Self-pay

## 2021-04-10 NOTE — Progress Notes (Signed)
Chronic Care Management Pharmacy Assistant   Name: Shari Lee  MRN: [id number] DOB: January 05, 1956   Reason for Encounter:Hypertension Disease State Call.   Recent office visits:  No recent Office Visits  Recent consult visits:  No recent [facility] visits:  None in previous 6 months  Medications: Outpatient Encounter Medications as of 04/10/2021  Medication Sig   albuterol (VENTOLIN HFA) 108 (90 Base) MCG/ACT inhaler Inhale 2 puffs into the lungs every 6 (six) hours as needed for wheezing or shortness of breath.   amLODipine-benazepril (LOTREL) 10-40 MG capsule Take 1 capsule by mouth daily.   Ascorbic Acid (VITAMIN C) 1000 MG tablet Take 1,000 mg by mouth daily.   aspirin EC 81 MG tablet Take 81 mg by mouth daily. Swallow whole.   atenolol (TENORMIN) 50 MG tablet Take 1 tablet (50 mg total) by mouth daily.   Blood Glucose Monitoring Suppl (CONTOUR NEXT USB MONITOR) w/Device KIT 1 kit by Does not apply route daily. To check blood sugar once daily.   CareTouch Safety Lancets 26G MISC 1 Device by Does not apply route daily. To check blood sugar once daily.   cetirizine (ZYRTEC) 10 MG tablet Take 10 mg by mouth daily.   Cholecalciferol (DIALYVITE VITAMIN D 5000 PO) Take 5,000 Units by mouth daily.   CINNAMON PO Take 350 mg by mouth daily.   dapagliflozin propanediol (FARXIGA) 10 MG TABS tablet Take 1 tablet (10 mg total) by mouth daily before breakfast.   glucose blood (CONTOUR NEXT TEST) test strip To check blood sugar once daily.   glyBURIDE (DIABETA) 2.5 MG tablet Take 1 tablet (2.5 mg total) by mouth daily with breakfast.   Insulin Glargine (BASAGLAR KWIKPEN) 100 UNIT/ML Inject 40 Units into the skin daily.   Insulin Pen Needle (BD PEN NEEDLE NANO 2ND GEN) 32G X 4 MM MISC To use with Lantus injections.   Insulin Pen Needle (NOVOFINE PLUS) 32G X 4 MM MISC To use with ozempic injections   rosuvastatin (CRESTOR) 5 MG tablet Take 1 tablet (5 mg total) by mouth daily.    TURMERIC CURCUMIN PO Take 2,000 mg by mouth daily.   ZINC GLUCONATE PO Take 11 mg by mouth daily. With Quercetin with 250 mg   No facility-administered encounter medications on file as of 04/10/2021.    Care Gaps: None ID Star Rating Drugs: Farxiga 10 mg- Last filled 02/06/2021 for 90 day supply at [facility] 25 mg- Last filled 10/08/2018 for 90 day supply at [facility] Rosuvastatin 5 mg- Last filled 03/23/2021 for 90 day supply at [facility] filled 10/03/2019 for 28 day supply at [facility] (Not Taking) Glyburide 2.5 mg- Last filled 02/06/2021 for 90 day supply at [facility]  Amlodipine- benazepril 10-40 mg last filled on 04/01/2021 for 90 day supply at [facility]. Reviewed chart prior to disease state call. Spoke with patient regarding BP  Recent Office Vitals: BP Readings from Last 3 Encounters:  12/12/20 (!) 137/56  11/10/20 (!) 160/98  10/27/20 (!) 152/73   Pulse Readings from Last 3 Encounters:  12/12/20 76  11/10/20 84  10/27/20 96    Wt Readings from Last 3 Encounters:  12/12/20 183 lb (83 kg)  11/10/20 182 lb (82.6 kg)  10/27/20 170 lb (77.1 kg)     Kidney Function Lab Results  Component Value Date/Time   CREATININE 0.52 10/13/2020 04:15 AM   CREATININE 0.54 10/12/2020 04:27 AM   GFRNONAA >60 10/13/2020 04:15 AM   GFRAA 60 10/01/2020 02:35 PM  BMP Latest Ref Rng & Units 10/13/2020 10/12/2020 10/11/2020  Glucose 70 - 99 mg/dL 86 92 169(H)  BUN 8 - 23 mg/dL _0 Creatinine 0.44 - 1.00 mg/dL 0.52 0.54 0.66  BUN/Creat Ratio 12 - 28 - - -  Sodium 135 - 145 mmol/L 134(L) 138 135  Potassium 3.5 - 5.1 mmol/L 3.9 3.2(L) 3.5  Chloride 98 - 111 mmol/L 99 102 100  CO2 22 - 32 mmol/L _1 Calcium 8.9 - 10.3 mg/dL 7.9(L) 7.7(L) 7.8(L)    Current antihypertensive regimen:  Amlodipine-benazepril 5-20 mg daily Atenolol 50 mg daily How often are you checking your Blood Pressure? daily Current home BP readings:  On 04/13/2021 it was 142/79. On  04/12/2021 it was 116/64. On 04/11/2021 it was 139/76. On 04/10/2021 it was 134/64. What recent interventions/DTPs have been made by any provider to improve Blood Pressure control since last CPP Visit: None ID Any recent hospitalizations or ED visits since last visit with CPP? No What diet changes have been made to improve Blood Pressure Control?  Patient Reports no changes in her blood pressure. What exercise is being done to improve your Blood Pressure Control?  Patient reports she does not exercise.  Adherence Review: Is the patient currently on ACE/ARB medication? Yes Does the patient have >5 day gap between last estimated fill dates? No  04/10/2021 Name: Shari Lee MRN: [id number] DOB: 1956-08-12 Shari Lee is a 65 y.o. year old female who is a primary care patient of Rubye [facility].  Comprehensive medication review performed; Spoke to patient regarding cholesterol  Lipid Panel    Component Value Date/Time   CHOL 164 10/01/2020 1435   TRIG 89 10/01/2020 1435   HDL 48 10/01/2020 1435   LDLCALC 99 10/01/2020 1435    10-year ASCVD risk score: The 10-year ASCVD risk score Mikey Bussing DC Brooke Bonito., et al., 2013) is: 13.6%   Values used to calculate the score:     Age: 65 years     Sex: Female     Is Non-Hispanic African American: No     Diabetic: Yes     Tobacco smoker: No     Systolic Blood Pressure: 852 mmHg     Is BP treated: Yes     HDL Cholesterol: 48 mg/dL     Total Cholesterol: 164 mg/dL  Current antihyperlipidemic regimen:   Rosuvastatin 5 mg daily Previous antihyperlipidemic medications tried: N/A ASCVD risk enhancing conditions: DM and HTN What recent interventions/DTPs have been made by any provider to improve Cholesterol control since last CPP Visit: Restart Rosuvastatin 5 mg daily  Any recent hospitalizations or ED visits since last visit with CPP? No What diet changes have been made to improve Cholesterol?  Patient Reports no changes in her blood  pressure. What exercise is being done to improve Cholesterol?  Patient reports she does not exercise.  Adherence Review: Does the patient have >5 day gap between last estimated fill dates? No  Ensure patient has picked up prescription from pharmacy and check if she started medication. Patient states she did picked up her Rosuvastatin but has not started.Patient states she is going to wait after her doctor appointment on 04/16/2021 to discuss it with the provider.   [place] Pharmacist Assistant [phone]

## 2021-04-16 ENCOUNTER — Other Ambulatory Visit: Payer: Self-pay

## 2021-04-16 ENCOUNTER — Ambulatory Visit (INDEPENDENT_AMBULATORY_CARE_PROVIDER_SITE_OTHER): Payer: 59 | Admitting: Family Medicine

## 2021-04-16 ENCOUNTER — Encounter: Payer: Self-pay | Admitting: Family Medicine

## 2021-04-16 VITALS — BP 137/70 | HR 68 | Temp 98.3°F | Resp 16 | Ht 62.0 in | Wt 197.0 lb

## 2021-04-16 DIAGNOSIS — I152 Hypertension secondary to endocrine disorders: Secondary | ICD-10-CM

## 2021-04-16 DIAGNOSIS — E1159 Type 2 diabetes mellitus with other circulatory complications: Secondary | ICD-10-CM

## 2021-04-16 DIAGNOSIS — E1169 Type 2 diabetes mellitus with other specified complication: Secondary | ICD-10-CM

## 2021-04-16 DIAGNOSIS — E1165 Type 2 diabetes mellitus with hyperglycemia: Secondary | ICD-10-CM | POA: Diagnosis not present

## 2021-04-16 DIAGNOSIS — Z794 Long term (current) use of insulin: Secondary | ICD-10-CM

## 2021-04-16 DIAGNOSIS — E785 Hyperlipidemia, unspecified: Secondary | ICD-10-CM

## 2021-04-16 LAB — POCT UA - MICROALBUMIN: Microalbumin Ur, POC: NEGATIVE mg/L

## 2021-04-16 LAB — POCT GLYCOSYLATED HEMOGLOBIN (HGB A1C): Hemoglobin A1C: 9.3 % — AB (ref 4.0–5.6)

## 2021-04-16 MED ORDER — BASAGLAR KWIKPEN 100 UNIT/ML ~~LOC~~ SOPN: 45.0000 [IU] | PEN_INJECTOR | Freq: Every day | SUBCUTANEOUS | 1 refills | Status: DC

## 2021-04-16 NOTE — Progress Notes (Signed)
Established patient visit   Patient: Shari Lemberger   DOB: 29-Oct-1955   64 y.o. Female  MRN: [id number] Visit Date: 04/16/2021  Today's healthcare provider: Lavon Paganini, MD   Chief Complaint  Patient presents with   Diabetes   Hypertension   Hyperlipidemia   Subjective    Diabetes Pertinent negatives for hypoglycemia include no dizziness or headaches. Pertinent negatives for diabetes include no chest pain, no fatigue and no weakness.  Hypertension Pertinent negatives include no chest pain, headaches, neck pain, palpitations or shortness of breath.  Hyperlipidemia Pertinent negatives include no chest pain, myalgias or shortness of breath.    Diabetes Mellitus Type II, follow-up  Lab Results  Component Value Date   HGBA1C 9.3 (A) 04/16/2021   HGBA1C 13.5 (H) 10/01/2020   HGBA1C 11.2 (A) 01/31/2019   Last seen for diabetes 4 months ago.  Management since then includes; started Synjardy to take twice daily. Stopped glyburide. Patient reports that she did not start this due to cost. She is taking Farxiga 60m daily. She has tried metformin in the past and it was intolerable.  She reports good compliance with treatment. She is not having side effects.   Home blood sugar records:  averaging in the 140s.  She reports her blood sugar this morning was in the 200s because she forgot to take her medication.   Episodes of hypoglycemia? No    Current insulin regiment: 40 units Most Recent Eye Exam: due  Hypertension, follow-up  BP Readings from Last 3 Encounters:  04/16/21 137/70  12/12/20 (!) 137/56  11/10/20 (!) 160/98   Wt Readings from Last 3 Encounters:  04/16/21 197 lb (89.4 kg)  12/12/20 183 lb (83 kg)  11/10/20 182 lb (82.6 kg)     She was last seen for hypertension 4 months ago.  BP at that visit was 122/79. Management since that visit includes; amlodipine-benazepril and atenolol. She reports good compliance with treatment. She is not having side  effects.  She is not exercising. She is adherent to low salt diet.   Outside blood pressures are checked occasionally.  She does not smoke.  Use of agents associated with hypertension: none.    Lipid/Cholesterol, follow-up  Last Lipid Panel: Lab Results  Component Value Date   CHOL 164 10/01/2020   LDLCALC 99 10/01/2020   HDL 48 10/01/2020   TRIG 89 10/01/2020    She was last seen for this 4 months ago.  Management since that visit includes; labs checked. She was prescribed 5 mg crestor but she wasn't taking it and she was just taking a baby aspirin every day. She is concerned about the medication because she is unsure if it is necessary.  She reports good compliance with treatment. She is not having side effects.   She is following a Regular diet. Current exercise: no regular exercise  Last metabolic panel Lab Results  Component Value Date   GLUCOSE 172 (H) 04/16/2021   NA 142 04/16/2021   K 4.7 04/16/2021   BUN 19 04/16/2021   CREATININE 1.02 (H) 04/16/2021   GFRNONAA >60 10/13/2020   GFRAA 60 10/01/2020   CALCIUM 9.8 04/16/2021   AST 38 10/12/2020   ALT 44 10/12/2020   The 10-year ASCVD risk score (Mikey BussingDC Jr., et al., 2013) is: 13.6%     Medications: Outpatient Medications Prior to Visit  Medication Sig   albuterol (VENTOLIN HFA) 108 (90 Base) MCG/ACT inhaler Inhale 2 puffs into the lungs every 6 (six)  hours as needed for wheezing or shortness of breath.   amLODipine-benazepril (LOTREL) 10-40 MG capsule Take 1 capsule by mouth daily.   Ascorbic Acid (VITAMIN C) 1000 MG tablet Take 1,000 mg by mouth daily.   aspirin EC 81 MG tablet Take 81 mg by mouth daily. Swallow whole.   atenolol (TENORMIN) 50 MG tablet Take 1 tablet (50 mg total) by mouth daily.   Blood Glucose Monitoring Suppl (CONTOUR NEXT USB MONITOR) w/Device KIT 1 kit by Does not apply route daily. To check blood sugar once daily.   CareTouch Safety Lancets 26G MISC 1 Device by Does not apply  route daily. To check blood sugar once daily.   cetirizine (ZYRTEC) 10 MG tablet Take 10 mg by mouth daily.   Cholecalciferol (DIALYVITE VITAMIN D 5000 PO) Take 5,000 Units by mouth daily.   CINNAMON PO Take 350 mg by mouth daily.   dapagliflozin propanediol (FARXIGA) 10 MG TABS tablet Take 1 tablet (10 mg total) by mouth daily before breakfast.   glucose blood (CONTOUR NEXT TEST) test strip To check blood sugar once daily.   glyBURIDE (DIABETA) 2.5 MG tablet Take 1 tablet (2.5 mg total) by mouth daily with breakfast.   Insulin Pen Needle (BD PEN NEEDLE NANO 2ND GEN) 32G X 4 MM MISC To use with Lantus injections.   Insulin Pen Needle (NOVOFINE PLUS) 32G X 4 MM MISC To use with ozempic injections   TURMERIC CURCUMIN PO Take 2,000 mg by mouth daily.   ZINC GLUCONATE PO Take 11 mg by mouth daily. With Quercetin with 250 mg   [DISCONTINUED] Insulin Glargine (BASAGLAR KWIKPEN) 100 UNIT/ML Inject 40 Units into the skin daily.   rosuvastatin (CRESTOR) 5 MG tablet Take 1 tablet (5 mg total) by mouth daily. (Patient not taking: Reported on 04/16/2021)   No facility-administered medications prior to visit.   Review of Systems  Constitutional:  Negative for appetite change, chills, fatigue and fever.  HENT:  Negative for ear pain, sinus pressure, sinus pain and sore throat.   Eyes:  Negative for pain and visual disturbance.  Respiratory:  Negative for cough, chest tightness, shortness of breath and wheezing.   Cardiovascular:  Negative for chest pain, palpitations and leg swelling.  Gastrointestinal:  Negative for abdominal pain, blood in stool, diarrhea, nausea and vomiting.  Genitourinary:  Negative for flank pain, frequency, pelvic pain and urgency.  Musculoskeletal:  Negative for back pain, myalgias and neck pain.  Neurological:  Negative for dizziness, weakness, light-headedness, numbness and headaches.      Objective    BP 137/70   Pulse 68   Temp 98.3 F (36.8 C)   Resp 16   Ht 5' 2"  (1.575 m)   Wt 197 lb (89.4 kg)   BMI 36.03 kg/m     Physical Exam Vitals reviewed.  Constitutional:      General: She is not in acute distress.    Appearance: Normal appearance. She is well-developed. She is not diaphoretic.  HENT:     Head: Normocephalic and atraumatic.  Eyes:     General: No scleral icterus.    Conjunctiva/sclera: Conjunctivae normal.  Neck:     Thyroid: No thyromegaly.  Cardiovascular:     Rate and Rhythm: Normal rate and regular rhythm.     Pulses: Normal pulses.     Heart sounds: Murmur (mild) heard.  Pulmonary:     Effort: Pulmonary effort is normal. No respiratory distress.     Breath sounds: Normal breath sounds. No  wheezing, rhonchi or rales.  Musculoskeletal:     Cervical back: Neck supple.     Right lower leg: No edema.     Left lower leg: No edema.  Feet:     Comments:    Lymphadenopathy:     Cervical: No cervical adenopathy.  Skin:    General: Skin is warm and dry.     Findings: No rash.  Neurological:     Mental Status: She is alert and oriented to person, place, and time. Mental status is at baseline.  Psychiatric:        Mood and Affect: Mood normal.        Behavior: Behavior normal.    Diabetic Foot Exam - Simple   Simple Foot Form Diabetic Foot exam was performed with the following findings: Yes 04/17/2021 12:07 PM  Visual Inspection No deformities, no ulcerations, no other skin breakdown bilaterally: Yes Sensation Testing Intact to touch and monofilament testing bilaterally: Yes Pulse Check Posterior Tibialis and Dorsalis pulse intact bilaterally: Yes Comments      Results for orders placed or performed in visit on 02/63/78  Basic Metabolic Panel (BMET)  Result Value Ref Range   Glucose 172 (H) 65 - 99 mg/dL   BUN 19 8 - 27 mg/dL   Creatinine, Ser 1.02 (H) 0.57 - 1.00 mg/dL   eGFR 61 >59 mL/min/1.73   BUN/Creatinine Ratio 19 12 - 28   Sodium 142 134 - 144 mmol/L   Potassium 4.7 3.5 - 5.2 mmol/L   Chloride 101 96 -  106 mmol/L   CO2 22 20 - 29 mmol/L   Calcium 9.8 8.7 - 10.3 mg/dL  POCT glycosylated hemoglobin (Hb A1C)  Result Value Ref Range   Hemoglobin A1C 9.3 (A) 4.0 - 5.6 %   HbA1c POC (<> result, manual entry)     HbA1c, POC (prediabetic range)     HbA1c, POC (controlled diabetic range)    POCT UA - Microalbumin  Result Value Ref Range   Microalbumin Ur, POC negative mg/L   Creatinine, POC     Albumin/Creatinine Ratio, Urine, POC      Assessment & Plan     Problem List Items Addressed This Visit       Cardiovascular and Mediastinum   Hypertension associated with diabetes ([place])    Well controlled Continue current medications Recheck metabolic panel F/u in 6 months        Relevant Medications   Insulin Glargine (BASAGLAR KWIKPEN) 100 UNIT/ML     Endocrine   Diabetes mellitus type 2, uncontrolled (HCC)    A1c is improving, but still with hyperglycemia and A1c above goal of less than 7 Continue Farxiga and glipizide at current dose Unable to tolerate metformin due to diarrhea Increase Basaglar to 45 units daily Foot exam today Declines eye exam until after she turns 66 Follow-up in 3 months repeat A1c       Relevant Medications   Insulin Glargine (BASAGLAR KWIKPEN) 100 UNIT/ML   Hyperlipidemia associated with type 2 diabetes mellitus ([place])    Reviewed last lipid panel Encouraged her to start Crestor and discussed the importance of lipid-lowering medications to lower heart disease and stroke risk in the setting of diabetes Recheck lipid panel in about 3 months       Relevant Medications   Insulin Glargine (BASAGLAR KWIKPEN) 100 UNIT/ML   Other Visit Diagnoses     Hypertension associated with type 2 diabetes mellitus ([place])    -  Primary   Relevant  Medications   Insulin Glargine (BASAGLAR KWIKPEN) 100 UNIT/ML   Other Relevant Orders   POCT glycosylated hemoglobin (Hb A1C) (Completed)   POCT UA - Microalbumin (Completed)   Basic Metabolic Panel (BMET) (Completed)    Type 2 diabetes mellitus with hyperglycemia, with long-term current use of insulin (HCC)       Relevant Medications   Insulin Glargine (BASAGLAR KWIKPEN) 100 UNIT/ML      I,Essence Turner,acting as a scribe for Lavon Paganini, MD.,have documented all relevant documentation on the behalf of Lavon Paganini, MD,as directed by  Lavon Paganini, MD while in the presence of Lavon Paganini, MD.  I, Lavon Paganini, MD, have reviewed all documentation for this visit. The documentation on 04/17/21 for the exam, diagnosis, procedures, and orders are all accurate and complete.   [place], Dionne Bucy, MD, MPH [facility]

## 2021-04-16 NOTE — Patient Instructions (Signed)
Diabetes Mellitus and Nutrition, Adult When you have diabetes, or diabetes mellitus, it is very important to have healthy eating habits because your blood sugar (glucose) levels are greatly affected by what you eat and drink. Eating healthy foods in the right amounts, at about the same times every day, can help you:  Control your blood glucose.  Lower your risk of heart disease.  Improve your blood pressure.  Reach or maintain a healthy weight. What can affect my meal plan? Every person with diabetes is different, and each person has different needs for a meal plan. Your health care provider may recommend that you work with a dietitian to make a meal plan that is best for you. Your meal plan may vary depending on factors such as:  The calories you need.  The medicines you take.  Your weight.  Your blood glucose, blood pressure, and cholesterol levels.  Your activity level.  Other health conditions you have, such as heart or kidney disease. How do carbohydrates affect me? Carbohydrates, also called carbs, affect your blood glucose level more than any other type of food. Eating carbs naturally raises the amount of glucose in your blood. Carb counting is a method for keeping track of how many carbs you eat. Counting carbs is important to keep your blood glucose at a healthy level, especially if you use insulin or take certain oral diabetes medicines. It is important to know how many carbs you can safely have in each meal. This is different for every person. Your dietitian can help you calculate how many carbs you should have at each meal and for each snack. How does alcohol affect me? Alcohol can cause a sudden decrease in blood glucose (hypoglycemia), especially if you use insulin or take certain oral diabetes medicines. Hypoglycemia can be a life-threatening condition. Symptoms of hypoglycemia, such as sleepiness, dizziness, and confusion, are similar to symptoms of having too much  alcohol.  Do not drink alcohol if: ? Your health care provider tells you not to drink. ? You are pregnant, may be pregnant, or are planning to become pregnant.  If you drink alcohol: ? Do not drink on an empty stomach. ? Limit how much you use to:  0-1 drink a day for women.  0-2 drinks a day for men. ? Be aware of how much alcohol is in your drink. In the [place] one drink equals one 12 oz bottle of beer (355 mL), one 5 oz glass of wine (148 mL), or one 1 oz glass of hard liquor (44 mL). ? Keep yourself hydrated with water, diet soda, or unsweetened iced tea.  Keep in mind that regular soda, juice, and other mixers may contain a lot of sugar and must be counted as carbs. What are tips for following this plan? Reading food labels  Start by checking the serving size on the "Nutrition Facts" label of packaged foods and drinks. The amount of calories, carbs, fats, and other nutrients listed on the label is based on one serving of the item. Many items contain more than one serving per package.  Check the total grams (g) of carbs in one serving. You can calculate the number of servings of carbs in one serving by dividing the total carbs by 15. For example, if a food has 30 g of total carbs per serving, it would be equal to 2 servings of carbs.  Check the number of grams (g) of saturated fats and trans fats in one serving. Choose foods that have   a low amount or none of these fats.  Check the number of milligrams (mg) of salt (sodium) in one serving. Most people should limit total sodium intake to less than 2,300 mg per day.  Always check the nutrition information of foods labeled as "low-fat" or "nonfat." These foods may be higher in added sugar or refined carbs and should be avoided.  Talk to your dietitian to identify your daily goals for nutrients listed on the label. Shopping  Avoid buying canned, pre-made, or processed foods. These foods tend to be high in fat, sodium, and added  sugar.  Shop around the outside edge of the grocery store. This is where you will most often find fresh fruits and vegetables, bulk grains, fresh meats, and fresh dairy. Cooking  Use low-heat cooking methods, such as baking, instead of high-heat cooking methods like deep frying.  Cook using healthy oils, such as olive, canola, or sunflower oil.  Avoid cooking with butter, cream, or high-fat meats. Meal planning  Eat meals and snacks regularly, preferably at the same times every day. Avoid going long periods of time without eating.  Eat foods that are high in fiber, such as fresh fruits, vegetables, beans, and whole grains. Talk with your dietitian about how many servings of carbs you can eat at each meal.  Eat 4-6 oz (112-168 g) of lean protein each day, such as lean meat, chicken, fish, eggs, or tofu. One ounce (oz) of lean protein is equal to: ? 1 oz (28 g) of meat, chicken, or fish. ? 1 egg. ?  cup (62 g) of tofu.  Eat some foods each day that contain healthy fats, such as avocado, nuts, seeds, and fish.   What foods should I eat? Fruits Berries. Apples. Oranges. Peaches. Apricots. Plums. Grapes. Mango. Papaya. Pomegranate. Kiwi. Cherries. Vegetables Lettuce. Spinach. Leafy greens, including kale, chard, collard greens, and mustard greens. Beets. Cauliflower. Cabbage. Broccoli. Carrots. Green beans. Tomatoes. Peppers. Onions. Cucumbers. Brussels sprouts. Grains Whole grains, such as whole-wheat or whole-grain bread, crackers, tortillas, cereal, and pasta. Unsweetened oatmeal. Quinoa. Brown or wild rice. Meats and other proteins Seafood. Poultry without skin. Lean cuts of poultry and beef. Tofu. Nuts. Seeds. Dairy Low-fat or fat-free dairy products such as milk, yogurt, and cheese. The items listed above may not be a complete list of foods and beverages you can eat. Contact a dietitian for more information. What foods should I avoid? Fruits Fruits canned with  syrup. Vegetables Canned vegetables. Frozen vegetables with butter or cream sauce. Grains Refined white flour and flour products such as bread, pasta, snack foods, and cereals. Avoid all processed foods. Meats and other proteins Fatty cuts of meat. Poultry with skin. Breaded or fried meats. Processed meat. Avoid saturated fats. Dairy Full-fat yogurt, cheese, or milk. Beverages Sweetened drinks, such as soda or iced tea. The items listed above may not be a complete list of foods and beverages you should avoid. Contact a dietitian for more information. Questions to ask a health care provider  Do I need to meet with a diabetes educator?  Do I need to meet with a dietitian?  What number can I call if I have questions?  When are the best times to check my blood glucose? Where to find more information:  American Diabetes Association: diabetes.org  Academy of Nutrition and Dietetics: www.eatright.org  National Institute of Diabetes and Digestive and Kidney Diseases: www.niddk.nih.gov  Association of Diabetes Care and Education Specialists: www.diabeteseducator.org Summary  It is important to have healthy eating   habits because your blood sugar (glucose) levels are greatly affected by what you eat and drink.  A healthy meal plan will help you control your blood glucose and maintain a healthy lifestyle.  Your health care provider may recommend that you work with a dietitian to make a meal plan that is best for you.  Keep in mind that carbohydrates (carbs) and alcohol have immediate effects on your blood glucose levels. It is important to count carbs and to use alcohol carefully. This information is not intended to replace advice given to you by your health care provider. Make sure you discuss any questions you have with your health care provider. Document Revised: 09/11/2019 Document Reviewed: 09/11/2019 Elsevier Patient Education  2021 Elsevier Inc.  

## 2021-04-17 LAB — BASIC METABOLIC PANEL
BUN/Creatinine Ratio: 19 (ref 12–28)
BUN: 19 mg/dL (ref 8–27)
CO2: 22 mmol/L (ref 20–29)
Calcium: 9.8 mg/dL (ref 8.7–10.3)
Chloride: 101 mmol/L (ref 96–106)
Creatinine, Ser: 1.02 mg/dL — ABNORMAL HIGH (ref 0.57–1.00)
Glucose: 172 mg/dL — ABNORMAL HIGH (ref 65–99)
Potassium: 4.7 mmol/L (ref 3.5–5.2)
Sodium: 142 mmol/L (ref 134–144)
eGFR: 61 mL/min/{1.73_m2} (ref >59)

## 2021-04-17 NOTE — Assessment & Plan Note (Signed)
Well controlled Continue current medications Recheck metabolic panel F/u in 6 months  

## 2021-04-17 NOTE — Assessment & Plan Note (Signed)
A1c is improving, but still with hyperglycemia and A1c above goal of less than 7 Continue Farxiga and glipizide at current dose Unable to tolerate metformin due to diarrhea Increase Basaglar to 45 units daily Foot exam today Declines eye exam until after she turns 66 Follow-up in 3 months repeat A1c

## 2021-04-17 NOTE — Assessment & Plan Note (Signed)
Reviewed last lipid panel Encouraged her to start Crestor and discussed the importance of lipid-lowering medications to lower heart disease and stroke risk in the setting of diabetes Recheck lipid panel in about 3 months

## 2021-05-19 ENCOUNTER — Telehealth: Payer: Self-pay

## 2021-05-19 ENCOUNTER — Other Ambulatory Visit: Payer: Self-pay | Admitting: Physician Assistant

## 2021-05-19 DIAGNOSIS — E1165 Type 2 diabetes mellitus with hyperglycemia: Secondary | ICD-10-CM

## 2021-05-25 ENCOUNTER — Telehealth: Payer: Self-pay

## 2021-07-23 ENCOUNTER — Other Ambulatory Visit: Payer: Self-pay | Admitting: Physician Assistant

## 2021-07-23 DIAGNOSIS — I10 Essential (primary) hypertension: Secondary | ICD-10-CM

## 2021-08-07 ENCOUNTER — Ambulatory Visit: Payer: Self-pay

## 2021-08-07 NOTE — Telephone Encounter (Signed)
Pt. Reports she thinks she is having acid reflux at night that "cuts my breathing off." "It wakes me up and is getting worse. I can't breath when it happens." No availability in the practice. Pt. Refuses UC. Wants to be seen in the practice. Please advise.   Reason for Disposition  [1] MILD difficulty breathing (e.g., minimal/no SOB at rest, SOB with walking, pulse <100) AND [2] NEW-onset or WORSE than normal  Answer Assessment - Initial Assessment Questions 1. RESPIRATORY STATUS: "Describe your breathing?" (e.g., wheezing, shortness of breath, unable to speak, severe coughing)      Sleep apnea 2. ONSET: "When did this breathing problem begin?"      Worse the past 3 months 3. PATTERN "Does the difficult breathing come and go, or has it been constant since it started?"      Comes and goes at night 4. SEVERITY: "How bad is your breathing?" (e.g., mild, moderate, severe)    - MILD: No SOB at rest, mild SOB with walking, speaks normally in sentences, can lie down, no retractions, pulse < 100.    - MODERATE: SOB at rest, SOB with minimal exertion and prefers to sit, cannot lie down flat, speaks in phrases, mild retractions, audible wheezing, pulse 100-120.    - SEVERE: Very SOB at rest, speaks in single words, struggling to breathe, sitting hunched forward, retractions, pulse > 120      At night 5. RECURRENT SYMPTOM: "Have you had difficulty breathing before?" If Yes, ask: "When was the last time?" and "What happened that time?"      Yes 6. CARDIAC HISTORY: "Do you have any history of heart disease?" (e.g., heart attack, angina, bypass surgery, angioplasty)      No 7. LUNG HISTORY: "Do you have any history of lung disease?"  (e.g., pulmonary embolus, asthma, emphysema)     No 8. CAUSE: "What do you think is causing the breathing problem?"      Reflux 9. OTHER SYMPTOMS: "Do you have any other symptoms? (e.g., dizziness, runny nose, cough, chest pain, fever)     Throat feels full 10. O2  SATURATION MONITOR:  "Do you use an oxygen saturation monitor (pulse oximeter) at home?" If Yes, "What is your reading (oxygen level) today?" "What is your usual oxygen saturation reading?" (e.g., 95%)       97% 11. PREGNANCY: "Is there any chance you are pregnant?" "When was your last menstrual period?"       No 12. TRAVEL: "Have you traveled out of the country in the last month?" (e.g., travel history, exposures)       No  Protocols used: Breathing Difficulty-A-AH

## 2021-08-20 ENCOUNTER — Other Ambulatory Visit: Payer: Self-pay | Admitting: Family Medicine

## 2021-08-20 DIAGNOSIS — E1165 Type 2 diabetes mellitus with hyperglycemia: Secondary | ICD-10-CM

## 2021-09-27 ENCOUNTER — Other Ambulatory Visit: Payer: Self-pay | Admitting: Family Medicine

## 2021-09-27 DIAGNOSIS — E1165 Type 2 diabetes mellitus with hyperglycemia: Secondary | ICD-10-CM

## 2021-09-27 NOTE — Telephone Encounter (Signed)
Requested medication (s) are due for refill today: yes  Requested medication (s) are on the active medication list: yes  Last refill:  08/21/21 #30  Future visit scheduled: no  Notes to clinic:  pt need appt before further refills per note from previous fill.  Called pt and LM on VM to call office to make appt. Phone number provided.   Requested Prescriptions  Pending Prescriptions Disp Refills   FARXIGA 10 MG TABS tablet [Pharmacy Med Name: Farxiga 10 MG Oral Tablet] 30 tablet 0    Sig: TAKE 1 TABLET BY MOUTH ONCE DAILY BEFORE BREAKFAST . APPOINTMENT REQUIRED FOR FUTURE REFILLS     Endocrinology:  Diabetes - SGLT2 Inhibitors Failed - 09/27/2021 10:35 AM      Failed - Cr in normal range and within 360 days    Creatinine, Ser  Date Value Ref Range Status  04/16/2021 1.02 (H) 0.57 - 1.00 mg/dL Final          Failed - LDL in normal range and within 360 days    LDL Chol Calc (NIH)  Date Value Ref Range Status  10/01/2020 99 0 - 99 mg/dL Final          Failed - HBA1C is between 0 and 7.9 and within 180 days    Hemoglobin A1C  Date Value Ref Range Status  04/16/2021 9.3 (A) 4.0 - 5.6 % Final   Hgb A1c MFr Bld  Date Value Ref Range Status  10/01/2020 13.5 (H) 4.8 - 5.6 % Final    Comment:             Prediabetes: 5.7 - 6.4          Diabetes: >6.4          Glycemic control for adults with diabetes: <7.0           Passed - eGFR in normal range and within 360 days    GFR calc Af Amer  Date Value Ref Range Status  10/01/2020 60 >59 mL/min/1.73 Final    Comment:    **In accordance with recommendations from the NKF-ASN Task force,**   [facility] is in the process of updating its eGFR calculation to the   2021 CKD-EPI creatinine equation that estimates kidney function   without a race variable.    GFR, Estimated  Date Value Ref Range Status  10/13/2020 >60 >60 mL/min Final    Comment:    (NOTE) Calculated using the CKD-EPI Creatinine Equation (2021)    eGFR  Date  Value Ref Range Status  04/16/2021 61 >59 mL/min/1.73 Final          Passed - Valid encounter within last 6 months    Recent Outpatient Visits           5 months ago Hypertension associated with type 2 diabetes mellitus [facility])   [facility] [place], Dionne Bucy, MD   9 months ago Type 2 diabetes mellitus with hyperglycemia, with long-term current use of insulin [facility])   [place], [place], [place]   10 months ago Acute respiratory disease due to COVID-19 virus   [facility], [place], [place]   11 months ago Acute respiratory disease due to COVID-19 virus   [facility], Clearnce Sorrel, PA-C   12 months ago Annual physical exam   [facility] [place], [place], [place]

## 2021-10-06 ENCOUNTER — Ambulatory Visit (INDEPENDENT_AMBULATORY_CARE_PROVIDER_SITE_OTHER): Payer: 59 | Admitting: Physician Assistant

## 2021-10-06 ENCOUNTER — Ambulatory Visit: Payer: 59 | Admitting: Family Medicine

## 2021-10-06 ENCOUNTER — Other Ambulatory Visit: Payer: Self-pay

## 2021-10-06 ENCOUNTER — Encounter: Payer: Self-pay | Admitting: Physician Assistant

## 2021-10-06 VITALS — BP 139/83 | HR 66 | Temp 98.7°F | Resp 16 | Ht 62.0 in | Wt 200.0 lb

## 2021-10-06 DIAGNOSIS — I10 Essential (primary) hypertension: Secondary | ICD-10-CM

## 2021-10-06 DIAGNOSIS — E1165 Type 2 diabetes mellitus with hyperglycemia: Secondary | ICD-10-CM

## 2021-10-06 DIAGNOSIS — I152 Hypertension secondary to endocrine disorders: Secondary | ICD-10-CM

## 2021-10-06 DIAGNOSIS — E785 Hyperlipidemia, unspecified: Secondary | ICD-10-CM

## 2021-10-06 DIAGNOSIS — E1169 Type 2 diabetes mellitus with other specified complication: Secondary | ICD-10-CM | POA: Diagnosis not present

## 2021-10-06 DIAGNOSIS — E1159 Type 2 diabetes mellitus with other circulatory complications: Secondary | ICD-10-CM | POA: Diagnosis not present

## 2021-10-06 DIAGNOSIS — Z794 Long term (current) use of insulin: Secondary | ICD-10-CM

## 2021-10-06 LAB — POCT GLYCOSYLATED HEMOGLOBIN (HGB A1C)
Est. average glucose Bld gHb Est-mCnc: 217
Hemoglobin A1C: 9.2 % — AB (ref 4.0–5.6)

## 2021-10-06 MED ORDER — AMLODIPINE BESY-BENAZEPRIL HCL 5-10 MG PO CAPS: 1.0000 | ORAL_CAPSULE | Freq: Every day | ORAL | 3 refills | Status: DC

## 2021-10-06 MED ORDER — ATENOLOL 50 MG PO TABS: 50.0000 mg | ORAL_TABLET | Freq: Every day | ORAL | 3 refills | Status: DC

## 2021-10-06 MED ORDER — GLYBURIDE 5 MG PO TABS: 5.0000 mg | ORAL_TABLET | Freq: Every day | ORAL | 1 refills | Status: DC

## 2021-10-06 MED ORDER — BASAGLAR KWIKPEN 100 UNIT/ML ~~LOC~~ SOPN: 45.0000 [IU] | PEN_INJECTOR | Freq: Every day | SUBCUTANEOUS | 1 refills | Status: DC

## 2021-10-06 MED ORDER — ROSUVASTATIN CALCIUM 5 MG PO TABS
5.0000 mg | ORAL_TABLET | Freq: Every day | ORAL | 3 refills | Status: DC
Start: 2021-10-06 — End: 2022-11-09

## 2021-10-06 MED ORDER — DAPAGLIFLOZIN PROPANEDIOL 10 MG PO TABS: 10.0000 mg | ORAL_TABLET | Freq: Every day | ORAL | 3 refills | Status: DC

## 2021-10-06 NOTE — Progress Notes (Signed)
Established patient visit   Patient: Shari Lee   DOB: 1956-04-26   65 y.o. Female  MRN: [id number] Visit Date: 10/06/2021  Today's healthcare provider: Dani Gobble Sasuke Yaffe, PA-C   Introduced myself to the patient as a Journalist, newspaper and provided education on APPs in clinical practice.    Chief Complaint  Patient presents with   Follow-up   Diabetes   Hypertension   Hyperlipidemia   Subjective    HPI  Diabetes Mellitus Type II, follow-up  Lab Results  Component Value Date   HGBA1C 9.2 (A) 10/06/2021   HGBA1C 9.3 (A) 04/16/2021   HGBA1C 13.5 (H) 10/01/2020   Last seen for diabetes 6 months ago.  Management since then includes; Continue Farxiga and gliburide at current dose unable to tolerate metformin due to diarrhea. Increased Basaglar to 45 units daily. She reports good compliance with treatment. She is not having side effects. none  Home blood sugar records: fasting range: 213  Episodes of hypoglycemia? No none   Current insulin regiment: Basaglar  Most Recent Eye Exam: 07/31/2018   States she has not experienced any hypoglycemic events.  States blood glucose is ranging 175-200 at home Reports she is usually eating breakfast in the AM and dinner at 3-5pm without snacks   Usually eats fast food for dinner and breakfast.  Is exercising rarely at work   --------------------------------------------------------------------------------------------------- Hypertension, follow-up  BP Readings from Last 3 Encounters:  10/06/21 139/83  04/16/21 137/70  12/12/20 (!) 137/56   Wt Readings from Last 3 Encounters:  10/06/21 200 lb (90.7 kg)  04/16/21 197 lb (89.4 kg)  12/12/20 183 lb (83 kg)     She was last seen for hypertension 6 months ago.  BP at that visit was 137/70. Management since that visit includes; amlodipine-benazepril and atenolol. She reports good compliance with treatment. She is not having side effects. none She is not exercising. She is not adherent to  low salt diet.   Outside blood pressures are occasionally.  She does not smoke.  Use of agents associated with hypertension: none.   --------------------------------------------------------------------------------------------------- Lipid/Cholesterol, follow-up  Last Lipid Panel: Lab Results  Component Value Date   CHOL 164 10/01/2020   LDLCALC 99 10/01/2020   HDL 48 10/01/2020   TRIG 89 10/01/2020    She was last seen for this 1 years ago.  Management since that visit includes; on rosuvastatin.  She reports good compliance with treatment. She is not having side effects. none  She is following a diet high in fast food.  Current exercise: none  Last metabolic panel Lab Results  Component Value Date   GLUCOSE 172 (H) 04/16/2021   NA 142 04/16/2021   K 4.7 04/16/2021   BUN 19 04/16/2021   CREATININE 1.02 (H) 04/16/2021   EGFR 61 04/16/2021   GFRNONAA >60 10/13/2020   CALCIUM 9.8 04/16/2021   AST 38 10/12/2020   ALT 44 10/12/2020   The 10-year ASCVD risk score (Arnett DK, et al., 2019) is: 14%  ---------------------------------------------------------------------------------------------------     Medications: Outpatient Medications Prior to Visit  Medication Sig   albuterol (VENTOLIN HFA) 108 (90 Base) MCG/ACT inhaler Inhale 2 puffs into the lungs every 6 (six) hours as needed for wheezing or shortness of breath.   Blood Glucose Monitoring Suppl (CONTOUR NEXT USB MONITOR) w/Device KIT 1 kit by Does not apply route daily. To check blood sugar once daily.   CareTouch Safety Lancets 26G MISC 1 Device by Does not apply route daily. To check  blood sugar once daily.   Cholecalciferol (DIALYVITE VITAMIN D 5000 PO) Take 5,000 Units by mouth daily.   glucose blood (CONTOUR NEXT TEST) test strip To check blood sugar once daily.   Insulin Pen Needle (BD PEN NEEDLE NANO 2ND GEN) 32G X 4 MM MISC To use with Lantus injections.   Insulin Pen Needle (NOVOFINE PLUS) 32G X 4 MM  MISC To use with ozempic injections   [DISCONTINUED] amLODipine-benazepril (LOTREL) 10-40 MG capsule Take 1 capsule by mouth daily.   [DISCONTINUED] atenolol (TENORMIN) 50 MG tablet Take 1 tablet by mouth once daily   [DISCONTINUED] FARXIGA 10 MG TABS tablet TAKE 1 TABLET BY MOUTH ONCE DAILY BEFORE BREAKFAST . APPOINTMENT REQUIRED FOR FUTURE REFILLS   [DISCONTINUED] glyBURIDE (DIABETA) 2.5 MG tablet Take 1 tablet (2.5 mg total) by mouth daily with breakfast.   [DISCONTINUED] Insulin Glargine (BASAGLAR KWIKPEN) 100 UNIT/ML Inject 45 Units into the skin daily.   [DISCONTINUED] rosuvastatin (CRESTOR) 5 MG tablet Take 1 tablet (5 mg total) by mouth daily.   [DISCONTINUED] Ascorbic Acid (VITAMIN C) 1000 MG tablet Take 1,000 mg by mouth daily. (Patient not taking: Reported on 10/06/2021)   [DISCONTINUED] aspirin EC 81 MG tablet Take 81 mg by mouth daily. Swallow whole. (Patient not taking: Reported on 10/06/2021)   [DISCONTINUED] cetirizine (ZYRTEC) 10 MG tablet Take 10 mg by mouth daily. (Patient not taking: Reported on 10/06/2021)   [DISCONTINUED] CINNAMON PO Take 350 mg by mouth daily. (Patient not taking: Reported on 10/06/2021)   [DISCONTINUED] TURMERIC CURCUMIN PO Take 2,000 mg by mouth daily. (Patient not taking: Reported on 10/06/2021)   [DISCONTINUED] ZINC GLUCONATE PO Take 11 mg by mouth daily. With Quercetin with 250 mg (Patient not taking: Reported on 10/06/2021)   No facility-administered medications prior to visit.    Review of Systems  Constitutional:  Negative for appetite change, chills, fatigue and fever.  Respiratory:  Negative for chest tightness and shortness of breath.   Cardiovascular:  Negative for chest pain and palpitations.  Gastrointestinal:  Negative for abdominal pain, nausea and vomiting.  Endocrine: Negative for polyphagia.  Neurological:  Negative for dizziness and weakness.      Objective    BP 139/83 (BP Location: Right Arm, Patient Position: Sitting, Cuff  Size: Large)    Pulse 66    Temp 98.7 F (37.1 C) (Temporal)    Resp 16    Ht 5' 2"  (1.575 m)    Wt 200 lb (90.7 kg)    SpO2 98%    BMI 36.58 kg/m  {Show previous vital signs (optional):23777}  Physical Exam Constitutional:      Appearance: Normal appearance. She is obese.  HENT:     Head: Normocephalic and atraumatic.  Eyes:     Extraocular Movements: Extraocular movements intact.     Conjunctiva/sclera: Conjunctivae normal.  Pulmonary:     Effort: Pulmonary effort is normal. No respiratory distress.  Musculoskeletal:     Cervical back: Normal range of motion.  Neurological:     General: No focal deficit present.     Mental Status: She is alert and oriented to person, place, and time.  Psychiatric:        Mood and Affect: Mood normal.        Behavior: Behavior normal.      Results for orders placed or performed in visit on 10/06/21  POCT glycosylated hemoglobin (Hb A1C)  Result Value Ref Range   Hemoglobin A1C 9.2 (A) 4.0 - 5.6 %   Est.  average glucose Bld gHb Est-mCnc 217     Assessment & Plan     Problem List Items Addressed This Visit       Cardiovascular and Mediastinum   Hypertension associated with diabetes ([place])    Chronic and stable, well controlled on current regimen Patient brought medications in to treatment and stated she has been taking Amlodipine-benazepril 5-47m not Amlodipine-benazepril 10-469mas previously listed in medication record.  Medication record updated and refill for Amlodipine-benazepril 5-1078ment to pharmacy Continue current medication regimen Follow up in 3 months.       Relevant Medications   glyBURIDE (DIABETA) 5 MG tablet   amLODipine-benazepril (LOTREL) 5-10 MG capsule   dapagliflozin propanediol (FARXIGA) 10 MG TABS tablet   Insulin Glargine (BASAGLAR KWIKPEN) 100 UNIT/ML   atenolol (TENORMIN) 50 MG tablet   rosuvastatin (CRESTOR) 5 MG tablet     Endocrine   Diabetes mellitus type 2, uncontrolled - Primary    Current A1c  is maintaining at 9.2% after recent increase of Basaglar to 45units per day Increase Glyburide to 5mg74mday to assist with meeting A1c goal of <7% Continue other medications Encouraged patient to follow diet recommendations for diabetes to assist with meeting A1c goal as well as incorporating exercise into daily regimen Patient unwilling to makes these changes at this time.  Follow up in 3 months for monitoring and repeat A1c      Relevant Medications   glyBURIDE (DIABETA) 5 MG tablet   amLODipine-benazepril (LOTREL) 5-10 MG capsule   dapagliflozin propanediol (FARXIGA) 10 MG TABS tablet   Insulin Glargine (BASAGLAR KWIKPEN) 100 UNIT/ML   rosuvastatin (CRESTOR) 5 MG tablet   Other Relevant Orders   POCT glycosylated hemoglobin (Hb A1C) (Completed)   Hyperlipidemia associated with type 2 diabetes mellitus (HCC)[place] Patient states she is currently taking Crestor for management Repeat Lipid panel needed at next apt for monitoring  Follow up in 3 months.        Relevant Medications   glyBURIDE (DIABETA) 5 MG tablet   amLODipine-benazepril (LOTREL) 5-10 MG capsule   dapagliflozin propanediol (FARXIGA) 10 MG TABS tablet   Insulin Glargine (BASAGLAR KWIKPEN) 100 UNIT/ML   atenolol (TENORMIN) 50 MG tablet   rosuvastatin (CRESTOR) 5 MG tablet   Other Visit Diagnoses     Type 2 diabetes mellitus with hyperglycemia, with long-term current use of insulin (HCC)       Relevant Medications   glyBURIDE (DIABETA) 5 MG tablet   amLODipine-benazepril (LOTREL) 5-10 MG capsule   dapagliflozin propanediol (FARXIGA) 10 MG TABS tablet   Insulin Glargine (BASAGLAR KWIKPEN) 100 UNIT/ML   rosuvastatin (CRESTOR) 5 MG tablet   Primary hypertension       Relevant Medications   amLODipine-benazepril (LOTREL) 5-10 MG capsule   atenolol (TENORMIN) 50 MG tablet   rosuvastatin (CRESTOR) 5 MG tablet   Essential (primary) hypertension       Relevant Medications   amLODipine-benazepril (LOTREL) 5-10 MG  capsule   atenolol (TENORMIN) 50 MG tablet   rosuvastatin (CRESTOR) 5 MG tablet        Return in about 3 months (around 01/04/2022).   I, Jaiden Dinkins E Minola Guin, PA-C, have reviewed all documentation for this visit. The documentation on 10/06/21 for the exam, diagnosis, procedures, and orders are all accurate and complete.   Holdyn Poyser, Erin[place]ennie [place]-C  [phone]) [phone])  [facility]

## 2021-10-06 NOTE — Assessment & Plan Note (Signed)
Chronic and stable, well controlled on current regimen Patient brought medications in to treatment and stated she has been taking Amlodipine-benazepril 5-10mg  not Amlodipine-benazepril 10-40mg  as previously listed in medication record.  Medication record updated and refill for Amlodipine-benazepril 5-10mg  sent to pharmacy Continue current medication regimen Follow up in 3 months.

## 2021-10-06 NOTE — Assessment & Plan Note (Signed)
Patient states she is currently taking Crestor for management Repeat Lipid panel needed at next apt for monitoring  Follow up in 3 months.

## 2021-10-06 NOTE — Assessment & Plan Note (Addendum)
Current A1c is maintaining at 9.2% after recent increase of Basaglar to 45units per day Increase Glyburide to 5mg  today to assist with meeting A1c goal of <7% Continue other medications Encouraged patient to follow diet recommendations for diabetes to assist with meeting A1c goal as well as incorporating exercise into daily regimen Patient unwilling to makes these changes at this time.  Follow up in 3 months for monitoring and repeat A1c

## 2021-10-06 NOTE — Patient Instructions (Signed)
Today we discussed your diabetes and management plan.  At your last visit your Basaglar was increased to 45 units per day Today I am increasing your Glyburide to 5mg  by mouth once per day  Please continue to monitor your blood glucose at home Continue your other medications as prescribed until instructed otherwise Follow up in 3 months.

## 2022-01-05 ENCOUNTER — Other Ambulatory Visit: Payer: Self-pay

## 2022-01-05 ENCOUNTER — Encounter: Payer: Self-pay | Admitting: Physician Assistant

## 2022-01-05 ENCOUNTER — Ambulatory Visit (INDEPENDENT_AMBULATORY_CARE_PROVIDER_SITE_OTHER): Payer: Medicare Other | Admitting: Physician Assistant

## 2022-01-05 VITALS — BP 144/60 | HR 74 | Ht 62.0 in | Wt 199.2 lb

## 2022-01-05 DIAGNOSIS — E1165 Type 2 diabetes mellitus with hyperglycemia: Secondary | ICD-10-CM

## 2022-01-05 DIAGNOSIS — I152 Hypertension secondary to endocrine disorders: Secondary | ICD-10-CM

## 2022-01-05 DIAGNOSIS — E1159 Type 2 diabetes mellitus with other circulatory complications: Secondary | ICD-10-CM

## 2022-01-05 DIAGNOSIS — Z1231 Encounter for screening mammogram for malignant neoplasm of breast: Secondary | ICD-10-CM

## 2022-01-05 DIAGNOSIS — R011 Cardiac murmur, unspecified: Secondary | ICD-10-CM

## 2022-01-05 DIAGNOSIS — Z1211 Encounter for screening for malignant neoplasm of colon: Secondary | ICD-10-CM

## 2022-01-05 DIAGNOSIS — E1169 Type 2 diabetes mellitus with other specified complication: Secondary | ICD-10-CM | POA: Diagnosis not present

## 2022-01-05 DIAGNOSIS — Z794 Long term (current) use of insulin: Secondary | ICD-10-CM

## 2022-01-05 DIAGNOSIS — Z23 Encounter for immunization: Secondary | ICD-10-CM

## 2022-01-05 DIAGNOSIS — Z13820 Encounter for screening for osteoporosis: Secondary | ICD-10-CM

## 2022-01-05 DIAGNOSIS — E785 Hyperlipidemia, unspecified: Secondary | ICD-10-CM

## 2022-01-05 MED ORDER — ZOSTER VAC RECOMB ADJUVANTED 50 MCG/0.5ML IM SUSR: 0.5000 mL | Freq: Once | INTRAMUSCULAR | 0 refills | Status: AC

## 2022-01-05 MED ORDER — DAPAGLIFLOZIN PROPANEDIOL 10 MG PO TABS: 10.0000 mg | ORAL_TABLET | Freq: Every day | ORAL | 3 refills | Status: DC

## 2022-01-05 MED ORDER — INSULIN GLARGINE SOLOSTAR 100 UNIT/ML ~~LOC~~ SOPN: PEN_INJECTOR | SUBCUTANEOUS | 3 refills | Status: DC

## 2022-01-05 NOTE — Progress Notes (Signed)
?  ?I,Sasan Wilkie,acting as a scribe for [employer], PA-C.,have documented all relevant documentation on the behalf of [name], PA-C,as directed by  [name], PA-C while in the presence of [name], PA-C. ? ? ?Established patient visit ? ? ?Patient: Shari Lee   DOB: May 01, 1956   66 y.o. Female  MRN: [id number] ?Visit Date: 01/05/2022 ? ?Today's healthcare provider: [name], PA-C  ? ? ? ?Subjective  ?  ?HPI  ? ?Hypertension, follow-up ? ? ? ?BP Readings from Last 3 Encounters:  ?01/05/22 (!) 144/60  ?10/06/21 139/83  ?04/16/21 137/70  ? Wt Readings from Last 3 Encounters:  ?01/05/22 199 lb 3.2 oz (90.4 kg)  ?10/06/21 200 lb (90.7 kg)  ?04/16/21 197 lb (89.4 kg)  ?  ? ?She was last seen for hypertension 3 months ago.  ?BP at that visit was 139/83. Management since that visit includes; Chronic and stable, well controlled on current regimen. (Per Erin Mecum) ? ? ?She reports excellent compliance with treatment. ?She is not having side effects.  ?She is following a Regular diet. ?She is exercising. ?She does not smoke. ? ?Use of agents associated with hypertension: none.  ? ?Outside blood pressures are 124/55-168/74. Tries to take in the morning but varies between arms ?Symptoms: ?Yes chest pain No chest pressure  ?No palpitations No syncope  ?No dyspnea Yes orthopnea  ?No paroxysmal nocturnal dyspnea No lower extremity edema  ? ?Pertinent labs: ?Lab Results  ?Component Value Date  ? CHOL 165 01/05/2022  ? HDL 49 01/05/2022  ? [place] 84 01/05/2022  ? TRIG 189 (H) 01/05/2022  ? CHOLHDL 3.4 01/05/2022  ? Lab Results  ?Component Value Date  ? NA 136 01/05/2022  ? K 4.5 01/05/2022  ? CREATININE 0.98 01/05/2022  ? EGFR 64 01/05/2022  ? GLUCOSE 321 (H) 01/05/2022  ? TSH 2.410 10/01/2020  ?  ? ?The 10-year ASCVD risk score (Arnett DK, et al., 2019) is: 16.5%  ? ?--------------------------------------------------------------------------------------------------- ?Diabetes Mellitus Type II,  Follow-up ? ?Lab Results  ?Component Value Date  ? HGBA1C 11.3 (H) 01/05/2022  ? HGBA1C 9.2 (A) 10/06/2021  ? HGBA1C 9.3 (A) 04/16/2021  ? ?Wt Readings from Last 3 Encounters:  ?01/05/22 199 lb 3.2 oz (90.4 kg)  ?10/06/21 200 lb (90.7 kg)  ?04/16/21 197 lb (89.4 kg)  ? ?Last seen for diabetes 3 months ago.  ?Management since then includes; Current A1c is maintaining at 9.2% after recent increase of Basaglar to 45units per day. She recently switched to Medicare and was told they no longer cover basaglar. She has been without insulin for at least a week. ?Increase Glyburide to 67m today to assist with meeting A1c goal of <7% ?Encouraged patient to follow diet recommendations for diabetes to assist with meeting A1c goal as well as incorporating exercise into daily regimen (Per Erin Mecum) ? ?She reports good compliance with treatment. ?She is not having side effects.  ?Symptoms: ?No fatigue No foot ulcerations  ?No appetite changes No nausea  ?No paresthesia of the feet  No polydipsia  ?No polyuria No visual disturbances   ?No vomiting   ? ? ?Home blood sugar records: fasting range: 441 yesterday, today 138  ;  ? ?Episodes of hypoglycemia? No  ?  ?Current insulin regiment: Insulin Glargine (Basaglar kwikpen)Inject 45 unite into the skin daily and  ?Most Recent Eye Exam: A few years. Waiting to see what doctor is in her network ?Current exercise: weightlifting ?Current diet habits: in general, a "healthy" diet   ? ?  Pertinent Labs: ?Lab Results  ?Component Value Date  ? CHOL 165 01/05/2022  ? HDL 49 01/05/2022  ? [place] 84 01/05/2022  ? TRIG 189 (H) 01/05/2022  ? CHOLHDL 3.4 01/05/2022  ? Lab Results  ?Component Value Date  ? NA 136 01/05/2022  ? K 4.5 01/05/2022  ? CREATININE 0.98 01/05/2022  ? EGFR 64 01/05/2022  ? MICROALBUR negative 04/16/2021  ? LABMICR WILL FOLLOW 01/05/2022  ?  ? ?--------------------------------------------------------------------------------------------------- ?Lipid/Cholesterol,  Follow-up ? ?Last lipid panel Other pertinent labs  ?Lab Results  ?Component Value Date  ? CHOL 165 01/05/2022  ? HDL 49 01/05/2022  ? [place] 84 01/05/2022  ? TRIG 189 (H) 01/05/2022  ? CHOLHDL 3.4 01/05/2022  ? Lab Results  ?Component Value Date  ? ALT 18 01/05/2022  ? AST 19 01/05/2022  ? PLT 388 10/13/2020  ? TSH 2.410 10/01/2020  ?  ? ?She was last seen for this 3 months ago.  ?Management since that visit includes; Patient states she is currently taking Crestor for management, Repeat Lipid panel needed at next apt for monitoring (per Erin Mecum).  ? ?She reports excellent compliance with treatment. ?She is not having side effects.  ? ?Symptoms: ?No chest pain No chest pressure/discomfort  ?No dyspnea No lower extremity edema  ?Yes numbness or tingling of extremity (left hand little finger) Yes orthopnea  ?No palpitations No paroxysmal nocturnal dyspnea  ?No speech difficulty No syncope  ? ?Current diet: in general, a "healthy" diet   ?Current exercise: weightlifting ? ?The 10-year ASCVD risk score (Arnett DK, et al., 2019) is: 16.5% ? ?--------------------------------------------------------------------------------------------------- ?Medications: ?Outpatient Medications Prior to Visit  ?Medication Sig  ? albuterol (VENTOLIN HFA) 108 (90 Base) MCG/ACT inhaler Inhale 2 puffs into the lungs every 6 (six) hours as needed for wheezing or shortness of breath.  ? amLODipine-benazepril (LOTREL) 5-10 MG capsule Take 1 capsule by mouth daily.  ? atenolol (TENORMIN) 50 MG tablet Take 1 tablet (50 mg total) by mouth daily.  ? Blood Glucose Monitoring Suppl (CONTOUR NEXT USB MONITOR) w/Device KIT 1 kit by Does not apply route daily. To check blood sugar once daily.  ? CareTouch Safety Lancets 26G MISC 1 Device by Does not apply route daily. To check blood sugar once daily.  ? Cholecalciferol (DIALYVITE VITAMIN D 5000 PO) Take 5,000 Units by mouth daily.  ? glucose blood (CONTOUR NEXT TEST) test strip To check blood sugar  once daily.  ? glyBURIDE (DIABETA) 5 MG tablet Take 1 tablet (5 mg total) by mouth daily with breakfast.  ? Insulin Pen Needle (BD PEN NEEDLE NANO 2ND GEN) 32G X 4 MM MISC To use with Lantus injections.  ? Insulin Pen Needle (NOVOFINE PLUS) 32G X 4 MM MISC To use with ozempic injections  ? omeprazole (PRILOSEC) 20 MG capsule Take 20 mg by mouth daily.  ? rosuvastatin (CRESTOR) 5 MG tablet Take 1 tablet (5 mg total) by mouth daily.  ? [DISCONTINUED] dapagliflozin propanediol (FARXIGA) 10 MG TABS tablet Take 1 tablet (10 mg total) by mouth daily. (Patient not taking: Reported on 01/05/2022)  ? [DISCONTINUED] Insulin Glargine (BASAGLAR KWIKPEN) 100 UNIT/ML Inject 45 Units into the skin daily. (Patient not taking: Reported on 01/05/2022)  ? ?No facility-administered medications prior to visit.  ? ? ?Review of Systems  ?Constitutional:  Negative for fatigue and fever.  ?Respiratory:  Negative for cough and shortness of breath.   ?Cardiovascular:  Negative for chest pain and leg swelling.  ?Gastrointestinal:  Negative for abdominal pain.  ?  Neurological:  Negative for dizziness and headaches.  ? ? ?  Objective  ?  ?Blood pressure (!) 144/60, pulse 74, height 5' 2"  (1.575 m), weight 199 lb 3.2 oz (90.4 kg).  ? ?Physical Exam ?Constitutional:   ?   General: She is awake.  ?   Appearance: She is well-developed.  ?HENT:  ?   Head: Normocephalic.  ?Eyes:  ?   Conjunctiva/sclera: Conjunctivae normal.  ?Cardiovascular:  ?   Rate and Rhythm: Normal rate and regular rhythm.  ?   Heart sounds: Murmur heard.  ?Pulmonary:  ?   Effort: Pulmonary effort is normal.  ?   Breath sounds: Normal breath sounds.  ?Skin: ?   General: Skin is warm.  ?Neurological:  ?   Mental Status: She is alert and oriented to person, place, and time.  ?Psychiatric:     ?   Attention and Perception: Attention normal.     ?   Mood and Affect: Mood normal.     ?   Speech: Speech normal.     ?   Behavior: Behavior is cooperative.  ?  ? ?Results for orders placed or  performed in visit on 01/05/22  ?Lipid Profile  ?Result Value Ref Range  ? Cholesterol, Total 165 100 - 199 mg/dL  ? Triglycerides 189 (H) 0 - 149 mg/dL  ? HDL 49 >39 mg/dL  ? VLDL Cholesterol Cal 32 5 - 40 mg/dL  ?

## 2022-01-05 NOTE — Assessment & Plan Note (Signed)
Stable today continue amlodipine-benazepril 5-10 mg daily. ?

## 2022-01-05 NOTE — Assessment & Plan Note (Addendum)
Currently manages with  ?45 u basaglar insulin daily ?farziga 10 mg ?Glyburide 5 mg ?  ?Will replace basaglar insulin with lantus. Will talk to Noe Gens about medication costs to ensure adherance ?a1c ordered today ?

## 2022-01-05 NOTE — Assessment & Plan Note (Signed)
Will check lipid panel ?Can check annually if stable ?Continue crestor 5 mg ?The 10-year ASCVD risk score (Arnett DK, et al., 2019) is: 16.6% ? ?

## 2022-01-06 ENCOUNTER — Encounter: Payer: Self-pay | Admitting: Physician Assistant

## 2022-01-06 ENCOUNTER — Other Ambulatory Visit: Payer: Self-pay | Admitting: Physician Assistant

## 2022-01-06 ENCOUNTER — Telehealth: Payer: Self-pay | Admitting: *Deleted

## 2022-01-06 DIAGNOSIS — R011 Cardiac murmur, unspecified: Secondary | ICD-10-CM | POA: Insufficient documentation

## 2022-01-06 DIAGNOSIS — E1165 Type 2 diabetes mellitus with hyperglycemia: Secondary | ICD-10-CM

## 2022-01-06 LAB — COMPREHENSIVE METABOLIC PANEL
ALT: 18 IU/L (ref 0–32)
AST: 19 IU/L (ref 0–40)
Albumin/Globulin Ratio: 1.8 (ref 1.2–2.2)
Albumin: 4.7 g/dL (ref 3.8–4.8)
Alkaline Phosphatase: 114 IU/L (ref 44–121)
BUN/Creatinine Ratio: 15 (ref 12–28)
BUN: 15 mg/dL (ref 8–27)
Bilirubin Total: 0.3 mg/dL (ref 0.0–1.2)
CO2: 23 mmol/L (ref 20–29)
Calcium: 9.7 mg/dL (ref 8.7–10.3)
Chloride: 98 mmol/L (ref 96–106)
Creatinine, Ser: 0.98 mg/dL (ref 0.57–1.00)
Globulin, Total: 2.6 g/dL (ref 1.5–4.5)
Glucose: 321 mg/dL — ABNORMAL HIGH (ref 70–99)
Potassium: 4.5 mmol/L (ref 3.5–5.2)
Sodium: 136 mmol/L (ref 134–144)
Total Protein: 7.3 g/dL (ref 6.0–8.5)
eGFR: 64 mL/min/{1.73_m2} (ref >59)

## 2022-01-06 LAB — HEMOGLOBIN A1C
Est. average glucose Bld gHb Est-mCnc: 278 mg/dL
Hgb A1c MFr Bld: 11.3 % — ABNORMAL HIGH (ref 4.8–5.6)

## 2022-01-06 LAB — MICROALBUMIN / CREATININE URINE RATIO
Creatinine, Urine: 101.7 mg/dL
Microalb/Creat Ratio: 14 mg/g creat (ref 0–29)
Microalbumin, Urine: 13.9 ug/mL

## 2022-01-06 LAB — LIPID PANEL
Chol/HDL Ratio: 3.4 ratio (ref 0.0–4.4)
Cholesterol, Total: 165 mg/dL (ref 100–199)
HDL: 49 mg/dL (ref >39)
LDL Chol Calc (NIH): 84 mg/dL (ref 0–99)
Triglycerides: 189 mg/dL — ABNORMAL HIGH (ref 0–149)
VLDL Cholesterol Cal: 32 mg/dL (ref 5–40)

## 2022-01-06 NOTE — Assessment & Plan Note (Addendum)
Pt has been told only over the last few visits at [facility] that she has a murmur. First documented 6/22 as mild.  Denies ever seeing a cardiologist.  ?As this may be new / progressing, ref to cardio for eval.  ?

## 2022-01-06 NOTE — Chronic Care Management (AMB) (Signed)
?  Chronic Care Management  ? ?Note ? ?01/06/2022 ?Name: Shari Lee MRN: [id number] DOB: [birth year] ? ?Shari Lee is a 66 y.o. year old female who is a primary care patient of [name], [place]. I reached out to Guss Bunde by phone today in response to a referral sent by [name]'s PCP. ? ?[name] was given information about Chronic Care Management services today including:  ?CCM service includes personalized support from designated clinical staff supervised by her physician, including individualized plan of care and coordination with other care providers ?24/7 contact phone numbers for assistance for urgent and routine care needs. ?Service will only be billed when office clinical staff spend 20 minutes or more in a month to coordinate care. ?Only one practitioner may furnish and bill the service in a calendar month. ?The patient may stop CCM services at any time (effective at the end of the month) by phone call to the office staff. ?The patient is responsible for co-pay (up to 20% after annual deductible is met) if co-pay is required by the individual health plan.  ? ?Patient agreed to services and verbal consent obtained.  ? ?Follow up plan: ?Telephone appointment with care management team member scheduled for: 01/18/2022 ? ?Chai Routh, CCMA ?Care Guide, Embedded Care Coordination ?[place]  Care Management  ?Direct Dial: [phone] ? ? ?

## 2022-01-06 NOTE — Chronic Care Management (AMB) (Signed)
?  Chronic Care Management  ? ?Outreach Note ? ?01/06/2022 ?Name: Myah Guynes MRN: [id number] DOB: 01-Nov-1955 ? ?Anzleigh Slaven is a 66 y.o. year old female who is a primary care patient of [name], [place]. I reached out to Guss Bunde by phone today in response to a referral sent by Ms. Jeweline Basulto's primary care provider. ? ?An unsuccessful telephone outreach was attempted today. The patient was referred to the case management team for assistance with care management and care coordination.  ? ?Follow Up Plan: A HIPAA compliant phone message was left for the patient providing contact information and requesting a return call.  ? ?Prudie Guthridge, CCMA ?Care Guide, Embedded Care Coordination ?[place]  Care Management  ?Direct Dial: [phone] ? ? ?

## 2022-01-07 ENCOUNTER — Other Ambulatory Visit: Payer: Self-pay

## 2022-01-07 DIAGNOSIS — Z86010 Personal history of colonic polyps: Secondary | ICD-10-CM

## 2022-01-07 MED ORDER — NA SULFATE-K SULFATE-MG SULF 17.5-3.13-1.6 GM/177ML PO SOLN: 1.0000 | Freq: Once | ORAL | 0 refills | Status: AC

## 2022-01-07 NOTE — Progress Notes (Signed)
Gastroenterology Pre-Procedure Review ? ?Request Date: 02/09/2022 ?Requesting Physician: Dr. Allen Norris ? ?PATIENT REVIEW QUESTIONS: The patient responded to the following health history questions as indicated:   ? ?1. Are you having any GI issues? no ?2. Do you have a personal history of Polyps? yes (LAST COLONOSCOpY ) ?3. Do you have a family history of Colon Cancer or Polyps? yes (colon cancer) ?4. Diabetes Mellitus? yes (type 2 ) ?5. Joint replacements in the past 12 months?no ?6. Major health problems in the past 3 months?no ?7. Any artificial heart valves, MVP, or defibrillator?no ?   ?MEDICATIONS & ALLERGIES:    ?Patient reports the following regarding taking any anticoagulation/antiplatelet therapy:   ?Plavix, Coumadin, Eliquis, Xarelto, Lovenox, Pradaxa, Brilinta, or Effient? no ?Aspirin? no ? ?Patient confirms/reports the following medications:  ?Current Outpatient Medications  ?Medication Sig Dispense Refill  ? albuterol (VENTOLIN HFA) 108 (90 Base) MCG/ACT inhaler Inhale 2 puffs into the lungs every 6 (six) hours as needed for wheezing or shortness of breath. 8 g 2  ? amLODipine-benazepril (LOTREL) 5-10 MG capsule Take 1 capsule by mouth daily. 90 capsule 3  ? atenolol (TENORMIN) 50 MG tablet Take 1 tablet (50 mg total) by mouth daily. 90 tablet 3  ? Blood Glucose Monitoring Suppl (CONTOUR NEXT USB MONITOR) w/Device KIT 1 kit by Does not apply route daily. To check blood sugar once daily. 1 kit 0  ? CareTouch Safety Lancets 26G MISC 1 Device by Does not apply route daily. To check blood sugar once daily. 100 each 12  ? Cholecalciferol (DIALYVITE VITAMIN D 5000 PO) Take 5,000 Units by mouth daily.    ? dapagliflozin propanediol (FARXIGA) 10 MG TABS tablet Take 1 tablet (10 mg total) by mouth daily. 90 tablet 3  ? glucose blood (CONTOUR NEXT TEST) test strip To check blood sugar once daily. 100 each 5  ? glyBURIDE (DIABETA) 5 MG tablet Take 1 tablet (5 mg total) by mouth daily with breakfast. 90 tablet 1  ?  Insulin Glargine Solostar (LANTUS) 100 UNIT/ML Solostar Pen Inject 45 units into the skin daily. 30 mL 3  ? Insulin Pen Needle (BD PEN NEEDLE NANO 2ND GEN) 32G X 4 MM MISC To use with Lantus injections. 100 each 1  ? Insulin Pen Needle (NOVOFINE PLUS) 32G X 4 MM MISC To use with ozempic injections 100 each 3  ? omeprazole (PRILOSEC) 20 MG capsule Take 20 mg by mouth daily.    ? rosuvastatin (CRESTOR) 5 MG tablet Take 1 tablet (5 mg total) by mouth daily. 90 tablet 3  ? ?No current facility-administered medications for this visit.  ? ? ?Patient confirms/reports the following allergies:  ?Allergies  ?Allergen Reactions  ? Amoxicillin-Pot Clavulanate   ? Levsin  [Hyoscyamine]   ?  Other reaction(s): Dizzyness  ? Penicillins   ? Metformin Diarrhea  ? Metformin And Related Diarrhea  ? ? ?No orders of the defined types were placed in this encounter. ? ? ?AUTHORIZATION INFORMATION ?Primary Insurance: ?1D#: ?Group #: ? ?Secondary Insurance: ?1D#: ?Group #: ? ?SCHEDULE INFORMATION: ?Date: 02/09/2022 ?Time: ?Location:[facility] ? ?

## 2022-01-08 ENCOUNTER — Other Ambulatory Visit: Payer: Self-pay | Admitting: Physician Assistant

## 2022-01-08 DIAGNOSIS — Z794 Long term (current) use of insulin: Secondary | ICD-10-CM

## 2022-01-08 MED ORDER — INSULIN GLARGINE SOLOSTAR 100 UNIT/ML ~~LOC~~ SOPN: PEN_INJECTOR | SUBCUTANEOUS | 3 refills | Status: DC

## 2022-01-11 ENCOUNTER — Other Ambulatory Visit: Payer: Self-pay

## 2022-01-11 ENCOUNTER — Telehealth: Payer: Self-pay | Admitting: Physician Assistant

## 2022-01-11 DIAGNOSIS — E1165 Type 2 diabetes mellitus with hyperglycemia: Secondary | ICD-10-CM

## 2022-01-11 NOTE — Telephone Encounter (Signed)
[place] faxed refill request for the following medications: ? ?glyBURIDE (DIABETA) 5 MG tablet  ? ?Please advise. ? ?

## 2022-01-12 ENCOUNTER — Other Ambulatory Visit: Payer: Self-pay | Admitting: Physician Assistant

## 2022-01-12 DIAGNOSIS — Z794 Long term (current) use of insulin: Secondary | ICD-10-CM

## 2022-01-12 MED ORDER — GLYBURIDE 5 MG PO TABS: 5.0000 mg | ORAL_TABLET | Freq: Every day | ORAL | 1 refills | Status: DC

## 2022-01-15 ENCOUNTER — Telehealth: Payer: Self-pay

## 2022-01-15 NOTE — Progress Notes (Signed)
? ? ?  Chronic Care Management ?Pharmacy Assistant  ? ?Name: Shari Lee  MRN: [id number] DOB: [birth year] ? ? ?Reason for Encounter: Medication Review/Patient Assistance update on Lantus application. ? ? ? ?Medications: ?Outpatient Encounter Medications as of 01/15/2022  ?Medication Sig  ? albuterol (VENTOLIN HFA) 108 (90 Base) MCG/ACT inhaler Inhale 2 puffs into the lungs every 6 (six) hours as needed for wheezing or shortness of breath.  ? amLODipine-benazepril (LOTREL) 5-10 MG capsule Take 1 capsule by mouth daily.  ? atenolol (TENORMIN) 50 MG tablet Take 1 tablet (50 mg total) by mouth daily.  ? Blood Glucose Monitoring Suppl (CONTOUR NEXT USB MONITOR) w/Device KIT 1 kit by Does not apply route daily. To check blood sugar once daily.  ? CareTouch Safety Lancets 26G MISC 1 Device by Does not apply route daily. To check blood sugar once daily.  ? Cholecalciferol (DIALYVITE VITAMIN D 5000 PO) Take 5,000 Units by mouth daily.  ? dapagliflozin propanediol (FARXIGA) 10 MG TABS tablet Take 1 tablet (10 mg total) by mouth daily.  ? glucose blood (CONTOUR NEXT TEST) test strip To check blood sugar once daily.  ? glyBURIDE (DIABETA) 5 MG tablet Take 1 tablet (5 mg total) by mouth daily with breakfast.  ? Insulin Glargine Solostar (LANTUS) 100 UNIT/ML Solostar Pen Inject 50 units into the skin daily.  ? Insulin Pen Needle (BD PEN NEEDLE NANO 2ND GEN) 32G X 4 MM MISC To use with Lantus injections.  ? Insulin Pen Needle (NOVOFINE PLUS) 32G X 4 MM MISC To use with ozempic injections  ? omeprazole (PRILOSEC) 20 MG capsule Take 20 mg by mouth daily.  ? rosuvastatin (CRESTOR) 5 MG tablet Take 1 tablet (5 mg total) by mouth daily.  ? ?No facility-administered encounter medications on file as of 01/15/2022.  ? ? ?I received a task from [name], CPP requesting that I check the application status  for patient assistance on the medication Lantus.  ?  ?Per [employer], patient was approved to receive patient assistance on 01/13/2022 with  a end date of 10/17/2022. Patient will receive Lantus within 35 business days.  ? ?Shari Lee,CPA ?Clinical Pharmacist Assistant ?[phone]  ? ?

## 2022-01-18 ENCOUNTER — Ambulatory Visit (INDEPENDENT_AMBULATORY_CARE_PROVIDER_SITE_OTHER): Payer: Medicare Other

## 2022-01-18 ENCOUNTER — Telehealth: Payer: Self-pay

## 2022-01-18 DIAGNOSIS — I152 Hypertension secondary to endocrine disorders: Secondary | ICD-10-CM

## 2022-01-18 DIAGNOSIS — E1169 Type 2 diabetes mellitus with other specified complication: Secondary | ICD-10-CM

## 2022-01-18 DIAGNOSIS — Z794 Long term (current) use of insulin: Secondary | ICD-10-CM

## 2022-01-18 DIAGNOSIS — G4733 Obstructive sleep apnea (adult) (pediatric): Secondary | ICD-10-CM

## 2022-01-18 MED ORDER — INSULIN GLARGINE SOLOSTAR 100 UNIT/ML ~~LOC~~ SOPN: PEN_INJECTOR | SUBCUTANEOUS | 3 refills | Status: DC

## 2022-01-18 MED ORDER — AMLODIPINE BESY-BENAZEPRIL HCL 5-20 MG PO CAPS: 1.0000 | ORAL_CAPSULE | Freq: Every day | ORAL | 1 refills | Status: DC

## 2022-01-18 MED ORDER — DAPAGLIFLOZIN PROPANEDIOL 10 MG PO TABS: 10.0000 mg | ORAL_TABLET | Freq: Every day | ORAL | Status: DC

## 2022-01-18 NOTE — Progress Notes (Signed)
? ? ?  Chronic Care Management ?Pharmacy Assistant  ? ?Name: Shari Lee  MRN: [id number] DOB: Dec 28, 1955 ? ?Patient Assistance Shipment Status for [name] ? ?I received a task from [name], CPP stating that the patient has not received her [name] in the mail, but she was approved on 03/28. CPP would like me to contact [employer] and find out the shipment date for patient's medication. ? ?I contacted [employer] and spoke with a representative who confirmed they processed the patient's order for her [name] on 03/28 but it does take 10-14 business days for the patient to get her first shipment. ? ?CPP Notified.  ? ?Medications: ?Outpatient Encounter Medications as of 01/18/2022  ?Medication Sig  ? albuterol (VENTOLIN HFA) 108 (90 Base) MCG/ACT inhaler Inhale 2 puffs into the lungs every 6 (six) hours as needed for wheezing or shortness of breath. (Patient not taking: Reported on 01/18/2022)  ? amLODipine-benazepril (LOTREL) 5-10 MG capsule Take 1 capsule by mouth daily.  ? atenolol (TENORMIN) 50 MG tablet Take 1 tablet (50 mg total) by mouth daily.  ? Blood Glucose Monitoring Suppl (CONTOUR NEXT USB MONITOR) w/Device KIT 1 kit by Does not apply route daily. To check blood sugar once daily.  ? CareTouch Safety Lancets 26G MISC 1 Device by Does not apply route daily. To check blood sugar once daily.  ? dapagliflozin propanediol (FARXIGA) 10 MG TABS tablet Take 1 tablet (10 mg total) by mouth daily. (Patient not taking: Reported on 01/18/2022)  ? glucose blood (CONTOUR NEXT TEST) test strip To check blood sugar once daily.  ? glyBURIDE (DIABETA) 5 MG tablet Take 1 tablet (5 mg total) by mouth daily with breakfast.  ? Insulin Glargine Solostar (LANTUS) 100 UNIT/ML Solostar Pen Inject 50 units into the skin daily.  ? Insulin Pen Needle (BD PEN NEEDLE NANO 2ND GEN) 32G X 4 MM MISC To use with Lantus injections.  ? Insulin Pen Needle (NOVOFINE PLUS) 32G X 4 MM MISC To use with ozempic injections  ? omeprazole (PRILOSEC) 20 MG capsule  Take 20 mg by mouth daily.  ? rosuvastatin (CRESTOR) 5 MG tablet Take 1 tablet (5 mg total) by mouth daily.  ? ?No facility-administered encounter medications on file as of 01/18/2022.  ? ? ?Lynann Bologna, CPA/CMA ?Clinical Pharmacist Assistant ?Phone: [phone]  ? ?

## 2022-01-18 NOTE — Progress Notes (Signed)
? ?Chronic Care Management ?Pharmacy Note ? ?01/18/2022 ?Name:  Shari Lee MRN:  [id number] DOB:  October 03, 1956 ? ? ?Summary: ?Patient presents for CCM follow-up.  ? ?-Previously took Amlodipine-benazepril 5-20, was prescribed incorrect dose. Home and office blood pressures elevated.  ? ?-Home blood sugars elevated, has not received Farxiga from PAP yet.  ? ?-Patient reports poor sleep quality. Patient does not have CPAP Machine.  ? ?Recommendations/Changes made from today's visit: ?-RESTART [place]. Samples left at front desk and samples of [name] in the fridge.  ?-INCREASE Lantus to 50 units daily  ?-INCREASE amlodipine-benazepril back to 5-20 mg daily.  ? ?-Recommend retrial of Ozempic 0.25 mg weekly (with PAP).  ?-Recommend switching Lantus to [place].  ?-Recommend referral for sleep study and re-evaluation of sleep apnea symptoms.  ? ?Plan: ?HC follow-up in 2 weeks to evaluate blood sugars  ?CPP follow-up 1 month  ? ?Subjective: ?Shari Lee is an 66 y.o. year old female who is a primary patient of Shari Lee, Shari Lee, [place].  The CCM team was consulted for assistance with disease management and care coordination needs.   ? ?Engaged with patient by telephone for follow up visit in response to provider referral for pharmacy case management and/or care coordination services.  ? ?Consent to Services:  ?The patient was given information about Chronic Care Management services, agreed to services, and gave verbal consent prior to initiation of services.  Please see initial visit note for detailed documentation.  ? ?Patient Care Team: ?Shari Lee as PCP - General (Physician Assistant) ?Shari Lee, [facility] (Pharmacist) ? ?Recent office visits: ?01/05/22: Patient presented to Shari Kirschner, PA-C for follow-up. A1c 11%.  ? ?Recent consult visits: ?None in past 6 months.  ? ?Hospital visits: ?None in past 6 months  ? ?Objective: ? ?Lab Results  ?Component Value Date  ? CREATININE 0.98 01/05/2022  ? BUN 15  01/05/2022  ? GFRNONAA >60 10/13/2020  ? GFRAA 60 10/01/2020  ? NA 136 01/05/2022  ? K 4.5 01/05/2022  ? CALCIUM 9.7 01/05/2022  ? CO2 23 01/05/2022  ? GLUCOSE 321 (H) 01/05/2022  ? ? ?Lab Results  ?Component Value Date/Time  ? HGBA1C 11.3 (H) 01/05/2022 04:11 PM  ? HGBA1C 9.2 (A) 10/06/2021 04:17 PM  ? HGBA1C 9.3 (A) 04/16/2021 04:02 PM  ? HGBA1C 13.5 (H) 10/01/2020 02:35 PM  ? MICROALBUR negative 04/16/2021 04:09 PM  ? MICROALBUR negative 12/15/2018 09:10 AM  ?  ?Last diabetic Eye exam:  ?Lab Results  ?Component Value Date/Time  ? HMDIABEYEEXA No Retinopathy 07/31/2018 12:00 AM  ?  ?Last diabetic Foot exam: No results found for: HMDIABFOOTEX  ? ?Lab Results  ?Component Value Date  ? CHOL 165 01/05/2022  ? HDL 49 01/05/2022  ? [place] 84 01/05/2022  ? TRIG 189 (H) 01/05/2022  ? CHOLHDL 3.4 01/05/2022  ? ? ? ?  Latest Ref Rng & Units 01/05/2022  ?  4:11 PM 10/12/2020  ?  4:27 AM 10/11/2020  ?  4:32 AM  ?Hepatic Function  ?Total Protein 6.0 - 8.5 g/dL 7.3   5.3   5.2    ?Albumin 3.8 - 4.8 g/dL 4.7   2.4   2.4    ?AST 0 - 40 IU/L 19   38   46    ?ALT 0 - 32 IU/L 18   44   44    ?Alk Phosphatase 44 - 121 IU/L 114   43   51    ?Total Bilirubin 0.0 - 1.2 mg/dL 0.3  0.7   0.7    ? ? ?Lab Results  ?Component Value Date/Time  ? TSH 2.410 10/01/2020 02:35 PM  ? TSH 2.090 12/15/2018 08:05 AM  ? ? ? ?  Latest Ref Rng & Units 10/13/2020  ?  4:15 AM 10/12/2020  ?  4:27 AM 10/11/2020  ?  4:32 AM  ?CBC  ?WBC 4.0 - 10.5 K/uL 11.3   18.2   15.9    ?Hemoglobin 12.0 - 15.0 g/dL 12.3   12.3   12.0    ?Hematocrit 36.0 - 46.0 % 36.9   35.4   35.8    ?Platelets 150 - 400 K/uL 388   417   410    ? ? ?No results found for: VD25OH ? ?Clinical ASCVD: No  ?The 10-year ASCVD risk score (Arnett DK, et al., 2019) is: 16.5% ?  Values used to calculate the score: ?    Age: 73 years ?    Sex: Female ?    Is Non-Hispanic African American: No ?    Diabetic: Yes ?    Tobacco smoker: No ?    Systolic Blood Pressure: 643 mmHg ?    Is BP treated: Yes ?     HDL Cholesterol: 49 mg/dL ?    Total Cholesterol: 165 mg/dL   ? ? ?  01/05/2022  ?  3:15 PM 10/27/2020  ?  4:34 PM 10/01/2020  ?  1:55 PM  ?Depression screen PHQ 2/9  ?Decreased Interest 0 0 0  ?Down, Depressed, Hopeless 0 0 0  ?PHQ - 2 Score 0 0 0  ?Altered sleeping 3 0   ?Tired, decreased energy 0 0   ?Change in appetite 0 0   ?Feeling bad or failure about yourself  0 0   ?Trouble concentrating 0 0   ?Moving slowly or fidgety/restless 0 0   ?Suicidal thoughts 0 0   ?PHQ-9 Score 3 0   ?Difficult doing work/chores Not difficult at all Not difficult at all   ?  ? ?Social History  ? ?Tobacco Use  ?Smoking Status Never  ?Smokeless Tobacco Never  ? ?BP Readings from Last 3 Encounters:  ?01/05/22 (!) 144/60  ?10/06/21 139/83  ?04/16/21 137/70  ? ?Pulse Readings from Last 3 Encounters:  ?01/05/22 74  ?10/06/21 66  ?04/16/21 68  ? ?Wt Readings from Last 3 Encounters:  ?01/05/22 199 lb 3.2 oz (90.4 kg)  ?10/06/21 200 lb (90.7 kg)  ?04/16/21 197 lb (89.4 kg)  ? ?BMI Readings from Last 3 Encounters:  ?01/05/22 36.43 kg/m?  ?10/06/21 36.58 kg/m?  ?04/16/21 36.03 kg/m?  ? ? ?Assessment/Interventions: Review of patient past medical history, allergies, medications, health status, including review of consultants reports, laboratory and other test data, was performed as part of comprehensive evaluation and provision of chronic care management services.  ? ?SDOH:  (Social Determinants of Health) assessments and interventions performed: Yes ?SDOH Interventions   ? ?Flowsheet Row Most Recent Value  ?SDOH Interventions   ?Financial Strain Interventions Other (Lee)  [PAP]  ? ?  ? ? ? ?[place] ? ?Allergies  ?Allergen Reactions  ? Amoxicillin-Pot Clavulanate   ? Levsin  [Hyoscyamine]   ?  Other reaction(s): Dizzyness  ? Penicillins   ? Metformin Diarrhea  ? Metformin And Related Diarrhea  ? ? ?Medications Reviewed Today   ? ? Reviewed by Shari Lee, [facility] (Pharmacist) on 01/18/22 at [phone]  Med List Status: <None>   ? ?Medication Order Taking? Sig Documenting Provider Last Dose Status  Informant  ?albuterol (VENTOLIN HFA) 108 (90 Base) MCG/ACT inhaler [id number] No Inhale 2 puffs into the lungs every 6 (six) hours as needed for wheezing or shortness of breath.  ?Patient not taking: Reported on 01/18/2022  ? Nance Pear, MD Not Taking Active   ?amLODipine-benazepril (LOTREL) 5-10 MG capsule [id number] Yes Take 1 capsule by mouth daily. Mecum, Erin E, PA-C Taking Active   ?atenolol (TENORMIN) 50 MG tablet [id number] Yes Take 1 tablet (50 mg total) by mouth daily. Mecum, Dani Gobble, PA-C Taking Active   ?Blood Glucose Monitoring Suppl (CONTOUR NEXT USB MONITOR) w/Device KIT [id number]  1 kit by Does not apply route daily. To check blood sugar once daily. Mar Daring, PA-C  Active   ?CareTouch Safety Lancets 26G MISC [id number]  1 Device by Does not apply route daily. To check blood sugar once daily. Mar Daring, PA-C  Active   ?dapagliflozin propanediol (FARXIGA) 10 MG TABS tablet [id number] No Take 1 tablet (10 mg total) by mouth daily.  ?Patient not taking: Reported on 01/18/2022  ? Shari Kirschner, PA-C Not Taking Active   ?glucose blood (CONTOUR NEXT TEST) test strip [id number]  To check blood sugar once daily. Fenton Malling M, PA-C  Active   ?glyBURIDE (DIABETA) 5 MG tablet [id number] Yes Take 1 tablet (5 mg total) by mouth daily with breakfast. Shari Kirschner, PA-C Taking Active   ?Insulin Glargine Solostar (LANTUS) 100 UNIT/ML Solostar Pen [id number] Yes Inject 50 units into the skin daily. Shari Kirschner, PA-C Taking Active   ?Insulin Pen Needle (BD PEN NEEDLE NANO 2ND GEN) 32G X 4 MM MISC [id number]  To use with Lantus injections. Mar Daring, PA-C  Active   ?Insulin Pen Needle (NOVOFINE PLUS) 32G X 4 MM MISC [id number]  To use with ozempic injections Mar Daring, PA-C  Active   ?omeprazole (PRILOSEC) 20 MG capsule [id number] Yes Take 20 mg by mouth daily. [provider] Taking Active  Self  ?rosuvastatin (CRESTOR) 5 MG tablet [id number] Yes Take 1 tablet (5 mg total) by mouth daily. Mecum, Dani Gobble, PA-C Taking Active   ? ?  ?  ? ?  ? ? ?Patient Active Problem List  ? Diagnosis Date Noted  ? Murm

## 2022-01-18 NOTE — Progress Notes (Signed)
? ? ?Chronic Care Management ?Pharmacy Assistant  ? ?Name: Shari Lee  MRN: [id number] DOB: [birth year] ? ?Initial Visit with Shari Lee, CPP on 01/28/2022 @ 0900 ?  ?Conditions to be addressed/monitored: ?HTN, HLD, DMII, and Sleep Apnea ? ?Primary concerns for visit include: ?Wasn't able to speak with the patient as her appointment is today  ? ?Recent office visits:  ?01/05/2022 Shari Kirschner, PA-C (PCP Office Visit) for Type II DM- No medication changes noted, lab orders placed, Bone Density order placed, Mammogram order placed, Referral to Gastroenterology order placed, Cardiology order placed, patient to follow-up in 3 months ? ?10/06/2021 Shari Givens, PA-C (PCP Office Visit) for Follow-up- Stopped: Ascorbic Acid 1000 mg, Aspirin 81 mg, Cetirizine HCl 10 mg, Cinnamon 350 mg, Misc Natural Products Turmeric, Zinc Gluconate 11 mg due to patient not taking, Changed: Glyburide 2.5 mg daily to 5 mg daily, Dapagliflozin Propanediol 10 mg daily, Amlodipine Besy-Benazepril HCl 10-40 mg daily to 5-10 mg 1 capsule daily, lab order placed, patient to follow-up in 3 months ? ?Recent consult visits:  ?None ID ? ?Hospital visits:  ?None in previous 6 months ? ?Medications: ?Outpatient Encounter Medications as of 01/18/2022  ?Medication Sig  ? albuterol (VENTOLIN HFA) 108 (90 Base) MCG/ACT inhaler Inhale 2 puffs into the lungs every 6 (six) hours as needed for wheezing or shortness of breath.  ? amLODipine-benazepril (LOTREL) 5-10 MG capsule Take 1 capsule by mouth daily.  ? atenolol (TENORMIN) 50 MG tablet Take 1 tablet (50 mg total) by mouth daily.  ? Blood Glucose Monitoring Suppl (CONTOUR NEXT USB MONITOR) w/Device KIT 1 kit by Does not apply route daily. To check blood sugar once daily.  ? CareTouch Safety Lancets 26G MISC 1 Device by Does not apply route daily. To check blood sugar once daily.  ? Cholecalciferol (DIALYVITE VITAMIN D 5000 PO) Take 5,000 Units by mouth daily.  ? dapagliflozin propanediol (FARXIGA) 10 MG  TABS tablet Take 1 tablet (10 mg total) by mouth daily.  ? glucose blood (CONTOUR NEXT TEST) test strip To check blood sugar once daily.  ? glyBURIDE (DIABETA) 5 MG tablet Take 1 tablet (5 mg total) by mouth daily with breakfast.  ? Insulin Glargine Solostar (LANTUS) 100 UNIT/ML Solostar Pen Inject 50 units into the skin daily.  ? Insulin Pen Needle (BD PEN NEEDLE NANO 2ND GEN) 32G X 4 MM MISC To use with Lantus injections.  ? Insulin Pen Needle (NOVOFINE PLUS) 32G X 4 MM MISC To use with ozempic injections  ? omeprazole (PRILOSEC) 20 MG capsule Take 20 mg by mouth daily.  ? rosuvastatin (CRESTOR) 5 MG tablet Take 1 tablet (5 mg total) by mouth daily.  ? ?No facility-administered encounter medications on file as of 01/18/2022.  ? ?Care Gaps: ?Zoster Vaccines ?Diabetic Eye Exam ?PAP Smear ?Colonoscopy (last completed 08/13/2016 most insurances cover every 10 years unless high risk) ?Tetanus/TDAP ?PNA Vaccine ?BP > 140/90 ?Mammogram ?Dexa Scan ?A1C > 9 ? ?Star Rating Drugs: ?Farxiga 10 mg patient on patient assistance through [employer] ?Rosuvastatin 5 mg last filled on 01/09/2022 for a 90-Day supply with [facility] ?Amlodipine-Benazepril 5-10 mg last filled on 01/09/2022 for a 90-Day supply with [facility] ? ?Questions for Clinical Pharmacist:  ? ?1.Are you able to connect with Patient the patient's appointment is today so I was unable to speak with the patint ?   ?2.Confirmed appointment date/time with patient/caregiver?  appointment on 01/18/2022 at 090 with Daron Offer CPP ?  ?3.Visit type telephone ?  ?4.Patient/Caregiver instructed  to bring medications to appointment.  Did not have a chance to speak with the patient ?  ?5.What, if any, problems do you have getting your medications from the pharmacy?  Did not have a chance to speak with the patient ?   ?6.What is your top health concern to discuss at your upcoming visit? Did not speak with the patient ?  ?7.Have you seen any other providers since your  last visit? I was unable to speak with the patient to clarify ? ?Lynann Bologna, CPA/CMA ?Clinical Pharmacist Assistant ?Phone: [phone]  ? ? ? ? ?  ?  ? ? ? ?

## 2022-01-18 NOTE — Patient Instructions (Signed)
Visit Information ?It was great speaking with you today!  Please let me know if you have any questions about our visit. ? ? Goals Addressed   ? ?  ?  ?  ?  ? This Visit's Progress  ?  Monitor and Manage My Blood Sugar-Diabetes Type 2   On track  ?  Timeframe:  Long-Range Goal ?Priority:  High ?Start Date: 11/21/2020                            ?Expected End Date: 07/21/2021                     ? ?Follow Up within 30 days ?  ?- check blood sugar at prescribed times ?- check blood sugar if I feel it is too high or too low ?- enter blood sugar readings and medication or insulin into daily log ?- take the blood sugar log to all doctor visits  ?  ?Why is this important?   ?Checking your blood sugar at home helps to keep it from getting very high or very low.  ?Writing the results in a diary or log helps the doctor know how to care for you.  ?Your blood sugar log should have the time, date and the results.  ?Also, write down the amount of insulin or other medicine that you take.  ?Other information, like what you ate, exercise done and how you were feeling, will also be helpful.   ?  ?  Track and Manage My Blood Pressure-Hypertension   On track  ?  Timeframe:  Long-Range Goal ?Priority:  High ?Start Date: 03/25/2021                             ?Expected End Date: 09/24/2022                     ? ?Follow Up within 30 days ?  ?- check blood pressure 3 times per week  ?  ?Why is this important?   ?You won't feel high blood pressure, but it can still hurt your blood vessels.  ?High blood pressure can cause heart or kidney problems. It can also cause a stroke.  ?Making lifestyle changes like losing a little weight or eating less salt will help.  ?Checking your blood pressure at home and at different times of the day can help to control blood pressure.  ?If the doctor prescribes medicine remember to take it the way the doctor ordered.  ?Call the office if you cannot afford the medicine or if there are questions about it.   ?  ?Notes:   ?  ? ?  ? ? ?Patient Care Plan: General Pharmacy (Adult)  ?  ? ?Problem Identified: Hypertension, Hyperlipidemia, Diabetes   ?Priority: High  ?  ? ?Long-Range Goal: Patient-Specific Goal   ?Start Date: 11/21/2020  ?Expected End Date: 01/19/2023  ?This Visit's Progress: On track  ?Recent Progress: On track  ?Priority: High  ?Note:   ?Current Barriers:  ?Unable to independently afford treatment regimen ?Unable to achieve control of Diabetes   ? ?Pharmacist Clinical Goal(s):  ?Over the next 90 days, patient will achieve control of diabetes as evidenced by A1c <8% through collaboration with PharmD and provider.  ? ?Interventions: ?1:1 collaboration with [name], PA-C regarding development and update of comprehensive plan of care as evidenced by provider attestation and  co-signature ?Inter-disciplinary care team collaboration (see longitudinal plan of care) ?Comprehensive medication review performed; medication list updated in electronic medical record ? ?Hypertension (BP goal <130/80) ?-Uncontrolled ?-Current treatment: ?Amlodipine-benazepril 5-10 mg daily: Appropriate, Query effective ?Atenolol 50 mg daily: Query appropriate ?-Medications previously tried: NA  ?-Previously took Amlodipine-benazepril 5-20, prescribed incorrect dose.  ?-Current home readings: 140-147/70-80 ?-Denies hypotensive/hypertensive symptoms ?-INCREASE amlodipine-benazepril back to 5-20 mg daily.  ?-Recommended to continue current medication ? ?Hyperlipidemia: (LDL goal < 70) ?-uncontrolled ?-Current treatment: ?Rosuvastatin 5 mg daily: Appropriate, Query effective ?-Medications previously tried: NA  ?-Will defer adjusting rosuvastatin dose due to other changes.  ?-Continue current medications  ? ?Diabetes (A1c goal <8%) ?Diagnosed 2005  ?-uncontrolled ?-Current medications: ?Glyburide 5 mg daily: Query Appropriate  ?Lantus 45 units daily (0.50 u/kg): Appropriate, Query effective ?-Medications previously tried: Metformin (Diarrhea),  [place], Ozempic (ineffective)   ?-Ran out of [name], has not received first shipment from PAP.  ?-Current home glucose readings ? Fasting  ?3-Apr 238  ?2-Apr 191  ?1-Apr 233  ?31-Mar 188  ?30-Mar 187  ?29-Mar 188  ?28-Mar 166  ?27-Mar 207  ?26-Mar 262  ?Average 207  ?-Denies hypoglycemic symptoms ?-Current meal patterns: Working on intermittent fasting.  ?-Current exercise: 3000 steps daily, goal 5000.  ?-Patient has not noticed significantly change in blood sugar since running out of [place], discussed secondary benefits including potential CKD prevention  ?-Glyburide not recommended in age >73 due to hypoglycemia risk  ?-Lantus at maximum recommended dose (0.5 u/kg), but will increase slightly today while working on other medication changes.  ?-RESTART [place]. Samples left at front desk and samples of [name] in the fridge.  ?-INCREASE Lantus to 50 units daily  ?-Recommend retrial of Ozempic 0.25 mg weekly (with PAP). Will stop Glyburide once treatment dose of Ozempic reached.  ?-Recommend switching Lantus to [place].  ? ?Obstructive Sleep Apnea (Goal: Improve sleep quality) ?-Uncontrolled ?-Current treatment  ?None  ?-Medications previously tried: Melatonin (ineffective)  ?-Patient reports poor sleep quality. She typically gets 3-4 hours of sleep nightly and wakes up 2-3 times nightly.  ?-STOP-BANG Assessment score of 6-7 (high risk of sleep apnea).  ?-Patient does not have CPAP Machine  ?-Recommend referral for sleep study and re-evaluation of sleep apnea symptoms.  ? ? ?Patient Goals/Self-Care Activities ?Over the next 90 days, patient will:  ?- check glucose daily, document, and provide at future appointments ?check blood pressure weekly, document, and provide at future appointments ? ?Follow Up Plan: Telephone follow up appointment with care management team member scheduled for:  02/15/2021 at 3:00 PM ?  ? ? ? ?Patient agreed to services and verbal consent obtained.  ? ?Patient verbalizes understanding of  instructions and care plan provided today and agrees to view in [place]. Active MyChart status confirmed with patient.   ? ?[name], PharmD, BCACP, CPP  ?Clinical Pharmacist Practitioner  ?[place] ?[phone]  ?

## 2022-01-19 ENCOUNTER — Other Ambulatory Visit: Payer: Self-pay | Admitting: Physician Assistant

## 2022-01-19 DIAGNOSIS — Z794 Long term (current) use of insulin: Secondary | ICD-10-CM

## 2022-01-19 MED ORDER — OZEMPIC (0.25 OR 0.5 MG/DOSE) 2 MG/1.5ML ~~LOC~~ SOPN: 0.2500 mg | PEN_INJECTOR | SUBCUTANEOUS | 2 refills | Status: DC

## 2022-01-19 NOTE — Progress Notes (Signed)
Spoke to alex fleury rph about adding ozempic ?Will titrate  ?Spoke to [place] about additional med, likely eventually d/c glyburide, advised her to monitor sugars as usual ? ? ?

## 2022-01-20 ENCOUNTER — Telehealth: Payer: Self-pay

## 2022-01-20 NOTE — Telephone Encounter (Signed)
Copied from [place] [id number]. Topic: General - Other ?>> Jan 20, 2022  3:07 PM Leward Quan A wrote: ?Reason for CRM: Patient called in to inquire of [name] if she have samples of Semaglutide,0.25 or 0.'5MG'$ /DOS, (OZEMPIC, 0.25 OR 0.5 MG/DOSE,) 2 MG/1.5ML SOPN. Can be reached at Ph# [phone] ?

## 2022-01-22 ENCOUNTER — Ambulatory Visit: Payer: Medicare Other | Admitting: Cardiology

## 2022-01-22 ENCOUNTER — Encounter: Payer: Self-pay | Admitting: Cardiology

## 2022-01-22 VITALS — BP 122/66 | HR 60 | Ht 62.0 in | Wt 204.0 lb

## 2022-01-22 DIAGNOSIS — I10 Essential (primary) hypertension: Secondary | ICD-10-CM

## 2022-01-22 DIAGNOSIS — R011 Cardiac murmur, unspecified: Secondary | ICD-10-CM | POA: Diagnosis not present

## 2022-01-22 NOTE — Patient Instructions (Signed)
Medication Instructions:  ?Your physician recommends that you continue on your current medications as directed. Please refer to the Current Medication list given to you today. ? ?*If you need a refill on your cardiac medications before your next appointment, please call your pharmacy* ? ? ?Lab Work: ?None ordered ?If you have labs (blood work) drawn today and your tests are completely normal, you will receive your results only by: ?MyChart Message (if you have MyChart) OR ?A paper copy in the mail ?If you have any lab test that is abnormal or we need to change your treatment, we will call you to review the results. ? ? ?Testing/Procedures: ?Your physician has requested that you have an echocardiogram. Echocardiography is a painless test that uses sound waves to create images of your heart. It provides your doctor with information about the size and shape of your heart and how well your heart?s chambers and valves are working. This procedure takes approximately one hour. There are no restrictions for this procedure. ? ? ? ?Follow-Up: ?At [facility], you and your health needs are our priority.  As part of our continuing mission to provide you with exceptional heart care, we have created designated Provider Care Teams.  These Care Teams include your primary Cardiologist (physician) and Advanced Practice Providers (APPs -  Physician Assistants and Nurse Practitioners) who all work together to provide you with the care you need, when you need it. ? ?We recommend signing up for the patient portal called "MyChart".  Sign up information is provided on this After Visit Summary.  MyChart is used to connect with patients for Virtual Visits (Telemedicine).  Patients are able to view lab/test results, encounter notes, upcoming appointments, etc.  Non-urgent messages can be sent to your provider as well.   ?To learn more about what you can do with MyChart, go to [email or web address].   ? ?Your next appointment:   ?After  testing ? ?The format for your next appointment:   ?In Person ? ?Provider:   ?Kate Sable, MD{ ? ? ? ?Other Instructions ? ?Echocardiogram ?An echocardiogram is a test that uses sound waves (ultrasound) to produce images of the heart. ?Images from an echocardiogram can provide important information about: ?Heart size and shape. ?The size and thickness and movement of your heart's walls. ?Heart muscle function and strength. ?Heart valve function or if you have stenosis. Stenosis is when the heart valves are too narrow. ?If blood is flowing backward through the heart valves (regurgitation). ?A tumor or infectious growth around the heart valves. ?Areas of heart muscle that are not working well because of poor blood flow or injury from a heart attack. ?Aneurysm detection. An aneurysm is a weak or damaged part of an artery wall. The wall bulges out from the normal force of blood pumping through the body. ?Tell a health care provider about: ?Any allergies you have. ?All medicines you are taking, including vitamins, herbs, eye drops, creams, and over-the-counter medicines. ?Any blood disorders you have. ?Any surgeries you have had. ?Any medical conditions you have. ?Whether you are pregnant or may be pregnant. ?What are the risks? ?Generally, this is a safe test. However, problems may occur, including an allergic reaction to dye (contrast) that may be used during the test. ?What happens before the test? ?No specific preparation is needed. You may eat and drink normally. ?What happens during the test? ? ?You will take off your clothes from the waist up and put on a hospital gown. ?Electrodes or electrocardiogram (ECG)patches may  be placed on your chest. The electrodes or patches are then connected to a device that monitors your heart rate and rhythm. ?You will lie down on a table for an ultrasound exam. A gel will be applied to your chest to help sound waves pass through your skin. ?A handheld device, called a  transducer, will be pressed against your chest and moved over your heart. The transducer produces sound waves that travel to your heart and bounce back (or "echo" back) to the transducer. These sound waves will be captured in real-time and changed into images of your heart that can be viewed on a video monitor. The images will be recorded on a computer and reviewed by your health care provider. ?You may be asked to change positions or hold your breath for a short time. This makes it easier to get different views or better views of your heart. ?In some cases, you may receive an image enhancer through an IV in one of your veins. This can improve the quality of the pictures from your heart. ?The procedure may vary among health care providers and hospitals. ?What can I expect after the test? ?You may return to your normal, everyday life, including diet, activities, and medicines, unless your health care provider tells you not to do that. ?Follow these instructions at home: ?It is up to you to get the results of your test. Ask your health care provider, or the department that is doing the test, when your results will be ready. ?Keep all follow-up visits. This is important. ?Summary ?An echocardiogram is a test that uses sound waves (ultrasound) to produce images of the heart. ?Images from an echocardiogram can provide important information about the size and shape of your heart, heart muscle function, heart valve function, and other possible heart problems. ?You do not need to do anything to prepare before this test. You may eat and drink normally. ?After the echocardiogram is completed, you may return to your normal, everyday life, unless your health care provider tells you not to do that. ?This information is not intended to replace advice given to you by your health care provider. Make sure you discuss any questions you have with your health care provider. ?Document Revised: 06/17/2021 Document Reviewed:  05/27/2020 ?Elsevier Patient Education ? 2022 [place]. ? ? ?

## 2022-01-22 NOTE — Progress Notes (Signed)
?Cardiology Office Note:   ? ?Date:  01/22/2022  ? ?ID:  Shari Lee, DOB 1956/05/14, MRN [id number] ? ?PCP:  [name], PA-C ?  ?[facility] Providers ?Cardiologist:  Kate Sable, MD    ? ?Referring MD: [name], PA-C  ? ?Chief Complaint  ?Patient presents with  ? New Patient (Initial Visit)  ?  Referred by PCP for Cardiac Murmur. Meds reviewed verbally with patient.   ? ? ?History of Present Illness:   ? ?Shari Lee is a 66 y.o. female with a hx of hypertension, diabetes who presents due to a cardiac murmur.  Patient saw her primary care provider for scheduled visit, cardiac murmur was noted on exam.  She denies chest pain, shortness of breath, edema, palpitations.  Her father died from congestive heart failure in his 25s.  Mother had a heart attack in addition to other comorbidities such as cancer at age 69s.  She feels well, has no other concerns at this time. ? ?Past Medical History:  ?Diagnosis Date  ? Diabetes mellitus without complication ([place])   ? Headache   ? Hypertension   ? ? ?Past Surgical History:  ?Procedure Laterality Date  ? ABDOMINAL HYSTERECTOMY  2006  ? BREAST BIOPSY Left 2005  ? benign  ? BREAST EXCISIONAL BIOPSY Left 2005?  ?  -benign  ? COLONOSCOPY WITH PROPOFOL N/A 08/13/2016  ? Procedure: COLONOSCOPY WITH PROPOFOL;  Surgeon: Lucilla Lame, MD;  Location: [facility];  Service: Endoscopy;  Laterality: N/A;  ? POLYPECTOMY  08/13/2016  ? Procedure: POLYPECTOMY;  Surgeon: Lucilla Lame, MD;  Location: [facility];  Service: Endoscopy;;  ? ? ?Current Medications: ?Current Meds  ?Medication Sig  ? albuterol (VENTOLIN HFA) 108 (90 Base) MCG/ACT inhaler Inhale 2 puffs into the lungs every 6 (six) hours as needed for wheezing or shortness of breath.  ? amLODipine-benazepril (LOTREL) 5-20 MG capsule Take 1 capsule by mouth daily.  ? atenolol (TENORMIN) 50 MG tablet Take 1 tablet (50 mg total) by mouth daily.  ? Blood Glucose Monitoring Suppl (CONTOUR NEXT USB  MONITOR) w/Device KIT 1 kit by Does not apply route daily. To check blood sugar once daily.  ? CareTouch Safety Lancets 26G MISC 1 Device by Does not apply route daily. To check blood sugar once daily.  ? dapagliflozin propanediol (FARXIGA) 10 MG TABS tablet Take 1 tablet (10 mg total) by mouth daily. Patient receives through [employer] Patient Assistance  ? glucose blood (CONTOUR NEXT TEST) test strip To check blood sugar once daily.  ? glyBURIDE (DIABETA) 5 MG tablet Take 1 tablet (5 mg total) by mouth daily with breakfast.  ? Insulin Glargine Solostar (LANTUS) 100 UNIT/ML Solostar Pen Inject 50 units into the skin daily. Patient receives through [employer] Patient Assistance  ? Insulin Pen Needle (BD PEN NEEDLE NANO 2ND GEN) 32G X 4 MM MISC To use with Lantus injections.  ? Insulin Pen Needle (NOVOFINE PLUS) 32G X 4 MM MISC To use with ozempic injections  ? omeprazole (PRILOSEC) 20 MG capsule Take 20 mg by mouth daily.  ? rosuvastatin (CRESTOR) 5 MG tablet Take 1 tablet (5 mg total) by mouth daily.  ? Semaglutide,0.25 or 0.5MG /DOS, (OZEMPIC, 0.25 OR 0.5 MG/DOSE,) 2 MG/1.5ML SOPN Inject 0.25 mg into the skin once a week. For four weeks and then increase to 0.5 mg weekly for four weeks  ?  ? ?Allergies:   Amoxicillin-pot clavulanate, Levsin  [hyoscyamine], Penicillins, Metformin, and Metformin and related  ? ?Social History  ? ?Socioeconomic  History  ? Marital status: Divorced  ?  Spouse name: Not on file  ? Number of children: Not on file  ? Years of education: Not on file  ? Highest education level: Not on file  ?Occupational History  ? Not on file  ?Tobacco Use  ? Smoking status: Never  ? Smokeless tobacco: Never  ?Substance and Sexual Activity  ? Alcohol use: No  ?  Alcohol/week: 0.0 standard drinks  ? Drug use: No  ? Sexual activity: Not on file  ?Other Topics Concern  ? Not on file  ?Social History Narrative  ? Not on file  ? ?Social Determinants of Health  ? ?Financial Resource Strain: High Risk  ? Difficulty of  Paying Living Expenses: Very hard  ?Food Insecurity: Not on file  ?Transportation Needs: Not on file  ?Physical Activity: Not on file  ?Stress: Not on file  ?Social Connections: Not on file  ?  ? ?Family History: ?The patient's family history includes Breast cancer in her sister; Diabetes in her mother; Diverticulitis in her brother and sister; Heart attack in her mother; Heart disease in her mother; Heart failure in her father; Sleep apnea in her brother. ? ?ROS:   ?Please see the history of present illness.    ? All other systems reviewed and are negative. ? ?EKGs/Labs/Other Studies Reviewed:   ? ?The following studies were reviewed today: ? ? ?EKG:  EKG is  ordered today.  The ekg ordered today demonstrates sinus rhythm, first-degree AV block, otherwise normal ECG. ? ?Recent Labs: ?01/05/2022: ALT 18; BUN 15; Creatinine, Ser 0.98; Potassium 4.5; Sodium 136  ?Recent Lipid Panel ?   ?Component Value Date/Time  ? CHOL 165 01/05/2022 1611  ? TRIG 189 (H) 01/05/2022 1611  ? HDL 49 01/05/2022 1611  ? CHOLHDL 3.4 01/05/2022 1611  ? [place] 84 01/05/2022 1611  ? ? ? ?Risk Assessment/Calculations:   ? ? ?    ? ?Physical Exam:   ? ?VS:  BP 122/66 (BP Location: Left Arm, Patient Position: Sitting, Cuff Size: Large)   Pulse 60   Ht $R'5\' 2"'Ui$  (1.575 m)   Wt 204 lb (92.5 kg)   SpO2 97%   BMI 37.31 kg/m?    ? ?Wt Readings from Last 3 Encounters:  ?01/22/22 204 lb (92.5 kg)  ?01/05/22 199 lb 3.2 oz (90.4 kg)  ?10/06/21 200 lb (90.7 kg)  ?  ? ?GEN:  Well nourished, well developed in no acute distress ?HEENT: Normal ?NECK: No JVD; No carotid bruits ?CARDIAC: RRR, 2/6 systolic murmur loudest at left sternal border. ?RESPIRATORY:  Clear to auscultation without rales, wheezing or rhonchi  ?ABDOMEN: Soft, non-tender, non-distended ?MUSCULOSKELETAL:  No edema; No deformity  ?SKIN: Warm and dry ?NEUROLOGIC:  Alert and oriented x 3 ?PSYCHIATRIC:  Normal affect  ? ?ASSESSMENT:   ? ?1. Cardiac murmur   ?2. Primary hypertension   ? ?PLAN:    ? ?In order of problems listed above: ? ?Systolic murmur, obtain echo to evaluate any structural abnormalities. ?Hypertension, BP controlled.  Continue current BP meds. ? ?Follow-up after echocardiogram. ? ?   ? ? ? ?Medication Adjustments/Labs and Tests Ordered: ?Current medicines are reviewed at length with the patient today.  Concerns regarding medicines are outlined above.  ?Orders Placed This Encounter  ?Procedures  ? EKG 12-Lead  ? ECHOCARDIOGRAM COMPLETE  ? ?No orders of the defined types were placed in this encounter. ? ? ?Patient Instructions  ?Medication Instructions:  ?Your physician recommends that you continue on  your current medications as directed. Please refer to the Current Medication list given to you today. ? ?*If you need a refill on your cardiac medications before your next appointment, please call your pharmacy* ? ? ?Lab Work: ?None ordered ?If you have labs (blood work) drawn today and your tests are completely normal, you will receive your results only by: ?MyChart Message (if you have MyChart) OR ?A paper copy in the mail ?If you have any lab test that is abnormal or we need to change your treatment, we will call you to review the results. ? ? ?Testing/Procedures: ?Your physician has requested that you have an echocardiogram. Echocardiography is a painless test that uses sound waves to create images of your heart. It provides your doctor with information about the size and shape of your heart and how well your heart?s chambers and valves are working. This procedure takes approximately one hour. There are no restrictions for this procedure. ? ? ? ?Follow-Up: ?At [facility], you and your health needs are our priority.  As part of our continuing mission to provide you with exceptional heart care, we have created designated Provider Care Teams.  These Care Teams include your primary Cardiologist (physician) and Advanced Practice Providers (APPs -  Physician Assistants and Nurse  Practitioners) who all work together to provide you with the care you need, when you need it. ? ?We recommend signing up for the patient portal called "MyChart".  Sign up information is provided on this After Visit Summary.

## 2022-01-27 ENCOUNTER — Telehealth: Payer: Self-pay

## 2022-01-27 ENCOUNTER — Ambulatory Visit (INDEPENDENT_AMBULATORY_CARE_PROVIDER_SITE_OTHER): Payer: Medicare Other

## 2022-01-27 DIAGNOSIS — R011 Cardiac murmur, unspecified: Secondary | ICD-10-CM

## 2022-01-27 NOTE — Telephone Encounter (Signed)
Copied from [id number]. Topic: Appointment Scheduling - Scheduling Inquiry for Clinic ?>> Jan 27, 2022 12:12 PM Holley Dexter N wrote: ?Reason for CRM: Pt called in needing to schedule her Mammogram, pt states she needs a Diagnostic Mammo, pt also states she needs to speak with someone as well,  please advise. ?

## 2022-01-28 LAB — ECHOCARDIOGRAM COMPLETE
AR max vel: 0.81 cm2
AV Area VTI: 0.82 cm2
AV Area mean vel: 0.83 cm2
AV Mean grad: 25.3 mmHg
AV Peak grad: 45 mmHg
Ao pk vel: 3.36 m/s
Area-P 1/2: 3.34 cm2
Calc EF: 57.3 %
Single Plane A2C EF: 58 %
Single Plane A4C EF: 55.1 %

## 2022-01-28 NOTE — Telephone Encounter (Signed)
Patient advised. Appointment scheduled.  

## 2022-02-02 ENCOUNTER — Encounter: Payer: Self-pay | Admitting: Physician Assistant

## 2022-02-02 ENCOUNTER — Ambulatory Visit (INDEPENDENT_AMBULATORY_CARE_PROVIDER_SITE_OTHER): Payer: Medicare Other | Admitting: Physician Assistant

## 2022-02-02 VITALS — BP 106/55 | HR 84 | Ht 62.0 in | Wt 199.3 lb

## 2022-02-02 DIAGNOSIS — E1165 Type 2 diabetes mellitus with hyperglycemia: Secondary | ICD-10-CM | POA: Diagnosis not present

## 2022-02-02 DIAGNOSIS — Z794 Long term (current) use of insulin: Secondary | ICD-10-CM | POA: Diagnosis not present

## 2022-02-02 DIAGNOSIS — N644 Mastodynia: Secondary | ICD-10-CM | POA: Diagnosis not present

## 2022-02-02 DIAGNOSIS — I350 Nonrheumatic aortic (valve) stenosis: Secondary | ICD-10-CM | POA: Insufficient documentation

## 2022-02-02 NOTE — Assessment & Plan Note (Signed)
Declines breast exam today. ?Ordered mammo/sono ?

## 2022-02-02 NOTE — Progress Notes (Signed)
?  ? ?I,Sha'taria Tyson,acting as a Education administrator for [employer], PA-C.,have documented all relevant documentation on the behalf of [name], PA-C,as directed by  [name], PA-C while in the presence of [name], PA-C. ? ? ?Established patient visit ? ? ?Patient: Shari Lee   DOB: Feb 03, 1956   66 y.o. Female  MRN: [id number] ?Visit Date: 02/02/2022 ? ?Today's healthcare provider: [name], PA-C  ? ?Cc. Breast pain, DMII update ? ?Subjective  ?  ?HPI  ? ?Brooke is a 67 y/o female who presents today with concerns of burning in the left breast-- different spots in the left breast intermittently. Denies lumps/discharge/skin changes. ? ?She also updates on DMII -- she started intermittent fasting and saw a rapid response in her blood sugars.  ?She was getting fasting numbers of < 100 and has felt unwell the last few days. In response she d/c glyburide and lowered her insulin to 30 U at night. ?She has also started ozempic that she got from a friend? While she waits for the insurance approval from our formal prescriptions. Reports 0.25 mg x 2 weeks. Denies SE. ? ? ?Medications: ?Outpatient Medications Prior to Visit  ?Medication Sig  ? albuterol (VENTOLIN HFA) 108 (90 Base) MCG/ACT inhaler Inhale 2 puffs into the lungs every 6 (six) hours as needed for wheezing or shortness of breath.  ? amLODipine-benazepril (LOTREL) 5-20 MG capsule Take 1 capsule by mouth daily.  ? atenolol (TENORMIN) 50 MG tablet Take 1 tablet (50 mg total) by mouth daily.  ? Blood Glucose Monitoring Suppl (CONTOUR NEXT USB MONITOR) w/Device KIT 1 kit by Does not apply route daily. To check blood sugar once daily.  ? CareTouch Safety Lancets 26G MISC 1 Device by Does not apply route daily. To check blood sugar once daily.  ? dapagliflozin propanediol (FARXIGA) 10 MG TABS tablet Take 1 tablet (10 mg total) by mouth daily. Patient receives through [employer] Patient Assistance  ? glucose blood (CONTOUR NEXT TEST) test strip To check  blood sugar once daily.  ? Insulin Glargine Solostar (LANTUS) 100 UNIT/ML Solostar Pen Inject 50 units into the skin daily. Patient receives through [employer] Patient Assistance (Patient taking differently: Inject 30 units into the skin daily. Patient receives through [employer] Patient Assistance)  ? Insulin Pen Needle (BD PEN NEEDLE NANO 2ND GEN) 32G X 4 MM MISC To use with Lantus injections.  ? Insulin Pen Needle (NOVOFINE PLUS) 32G X 4 MM MISC To use with ozempic injections  ? omeprazole (PRILOSEC) 20 MG capsule Take 20 mg by mouth daily.  ? rosuvastatin (CRESTOR) 5 MG tablet Take 1 tablet (5 mg total) by mouth daily.  ? Semaglutide,0.25 or 0.5MG/DOS, (OZEMPIC, 0.25 OR 0.5 MG/DOSE,) 2 MG/1.5ML SOPN Inject 0.25 mg into the skin once a week. For four weeks and then increase to 0.5 mg weekly for four weeks  ? [DISCONTINUED] glyBURIDE (DIABETA) 5 MG tablet Take 1 tablet (5 mg total) by mouth daily with breakfast. (Patient not taking: Reported on 02/02/2022)  ? ?No facility-administered medications prior to visit.  ? ? ?Review of Systems  ?Constitutional:  Negative for fatigue and fever.  ?Respiratory:  Negative for cough and shortness of breath.   ?Cardiovascular:  Negative for chest pain and leg swelling.  ?Gastrointestinal:  Negative for abdominal pain.  ?Genitourinary:   ?     Breast pain  ?Neurological:  Negative for dizziness and headaches.  ? ? ?  Objective  ?  ?Blood pressure (!) 106/55, pulse 84, height 5' 2"  (  1.575 m), weight 199 lb 4.8 oz (90.4 kg), SpO2 100 %.  ? ?Physical Exam ?Constitutional:   ?   General: She is awake.  ?   Appearance: She is well-developed.  ?HENT:  ?   Head: Normocephalic.  ?Eyes:  ?   Conjunctiva/sclera: Conjunctivae normal.  ?Cardiovascular:  ?   Rate and Rhythm: Normal rate and regular rhythm.  ?   Heart sounds: Normal heart sounds.  ?Pulmonary:  ?   Effort: Pulmonary effort is normal.  ?   Breath sounds: Normal breath sounds.  ?Skin: ?   General: Skin is warm.  ?Neurological:  ?    Mental Status: She is alert and oriented to person, place, and time.  ?Psychiatric:     ?   Attention and Perception: Attention normal.     ?   Mood and Affect: Mood normal.     ?   Speech: Speech normal.     ?   Behavior: Behavior is cooperative.  ? Declines breast exam. ? ?No results found for any visits on 02/02/22. ? Assessment & Plan  ?  ? ?Problem List Items Addressed This Visit   ? ?  ? Endocrine  ? Type 2 diabetes mellitus with hyperglycemia (HCC)  ?  Advised feeling unwell d/t low BS after long history of hyperglycemia ?Advised she aim for fasting of 110-130.  ?Can keep decreasing the insulin if she is finding she is consistently hypogylcemic ?D/c glyburide. ?Will loop back in [name], [facility] to update. ? ?  ?  ?  ? Other  ? Pain of left breast - Primary  ?  Declines breast exam today. ?Ordered mammo/sono ? ?  ?  ? Relevant Orders  ? MM 3D SCREEN BREAST BILATERAL  ? US BREAST COMPLETE UNI LEFT INC AXILLA  ?  ? ?I, [name], PA-C have reviewed all documentation for this visit. The documentation on  02/02/2022  for the exam, diagnosis, procedures, and orders are all accurate and complete. ? ?[name], PA-C ?[facility] ?[id number] ?[place], [place], [address] ?Office: [phone] ?Fax: [phone]  ? ?[facility] ? ?

## 2022-02-02 NOTE — Assessment & Plan Note (Signed)
Advised feeling unwell d/t low BS after long history of hyperglycemia ?Advised she aim for fasting of 110-130.  ?Can keep decreasing the insulin if she is finding she is consistently hypogylcemic ?D/c glyburide. ?Will loop back in [name], [facility] to update. ?

## 2022-02-08 ENCOUNTER — Telehealth: Payer: Self-pay

## 2022-02-08 NOTE — Progress Notes (Addendum)
? ? ?Chronic Care Management ?Pharmacy Assistant  ? ?Name: Shari Lee  MRN: [id number] DOB: 05-Nov-1955 ? ?Patient Assistance Application for Ozempic and Trulicity ? ?I received a task from [name], CPP requesting that I start the patient assistance process for the patient's Ozempic 0.5 mg and [name] 50 units daily. ? ?I started the application process for both medications with [employer]. I tried contacting the patient to inquire if she would like the application mailed to her home address or if she would like to pick it up in the clinic. I was unable to speak with patient as she did not answer my call, and patient's voicemail was full so I was unable to LVM. ? ?Spoke with the patient and patient confirms that she would like the application mailed to her home address. Patient advised once she completes her portion of the application she is to return it to [facility] so CPP can fax it over to [employer] for processing. ? ?Patient reports that her blood sugars have been doing really good. She does report running a little low at the moment her blood sugar right now is 76. Patient states that she is having a colonoscopy tomorrow so due to her fasting her blood sugars are low. Patient reports that she did eat a popsicle to try and get her number up. ? ?Patient reports the last week or so her numbers have been ? ?Last night 04/23 169 she took 35 units of Insulin vs 30 Units ?4/24 103 this morning fasting ?04/23 139 ?04/22 95 ?04/21 109 ?04/20 97 ?04/19 111 ?04/18 110 ?04/17 83 ?04/16 69 ?04/15 86 ?04/14 94 ? ?The patient is aware that she has a follow-up with CPP on 02/15/2022. Patient advised if CPP makes changes or adjustments I will contact her back. Patient verbalized understanding.  ? ?Medications: ?Outpatient Encounter Medications as of 02/08/2022  ?Medication Sig  ? albuterol (VENTOLIN HFA) 108 (90 Base) MCG/ACT inhaler Inhale 2 puffs into the lungs every 6 (six) hours as needed for wheezing or shortness of  breath.  ? amLODipine-benazepril (LOTREL) 5-20 MG capsule Take 1 capsule by mouth daily.  ? atenolol (TENORMIN) 50 MG tablet Take 1 tablet (50 mg total) by mouth daily.  ? Blood Glucose Monitoring Suppl (CONTOUR NEXT USB MONITOR) w/Device KIT 1 kit by Does not apply route daily. To check blood sugar once daily.  ? CareTouch Safety Lancets 26G MISC 1 Device by Does not apply route daily. To check blood sugar once daily.  ? dapagliflozin propanediol (FARXIGA) 10 MG TABS tablet Take 1 tablet (10 mg total) by mouth daily. Patient receives through [employer] Patient Assistance  ? glucose blood (CONTOUR NEXT TEST) test strip To check blood sugar once daily.  ? Insulin Glargine Solostar (LANTUS) 100 UNIT/ML Solostar Pen Inject 50 units into the skin daily. Patient receives through [employer] Patient Assistance (Patient taking differently: Inject 30 units into the skin daily. Patient receives through [employer] Patient Assistance)  ? Insulin Pen Needle (BD PEN NEEDLE NANO 2ND GEN) 32G X 4 MM MISC To use with Lantus injections.  ? Insulin Pen Needle (NOVOFINE PLUS) 32G X 4 MM MISC To use with ozempic injections  ? omeprazole (PRILOSEC) 20 MG capsule Take 20 mg by mouth daily.  ? rosuvastatin (CRESTOR) 5 MG tablet Take 1 tablet (5 mg total) by mouth daily.  ? Semaglutide,0.25 or 0.5MG/DOS, (OZEMPIC, 0.25 OR 0.5 MG/DOSE,) 2 MG/1.5ML SOPN Inject 0.25 mg into the skin once a week. For four weeks and then  increase to 0.5 mg weekly for four weeks  ? ?No facility-administered encounter medications on file as of 02/08/2022.  ? ? ?Lynann Bologna, CPA/CMA ?Clinical Pharmacist Assistant ?Phone: [phone]  ? ? ?Addendum 4/25:  ?Recommend reducing Lantus to 28 units daily until CPP follow-up. ?

## 2022-02-09 ENCOUNTER — Encounter: Payer: Self-pay | Admitting: Gastroenterology

## 2022-02-09 ENCOUNTER — Ambulatory Visit: Payer: Medicare Other | Admitting: Certified Registered"

## 2022-02-09 ENCOUNTER — Telehealth: Payer: Self-pay

## 2022-02-09 ENCOUNTER — Other Ambulatory Visit: Payer: Self-pay

## 2022-02-09 ENCOUNTER — Encounter
Admission: RE | Disposition: A | Discharge status: Home / Self Care | Payer: Self-pay | Source: Ambulatory Visit | Attending: Gastroenterology

## 2022-02-09 ENCOUNTER — Ambulatory Visit
Admission: RE | Admit: 2022-02-09 | Discharge: 2022-02-09 | Disposition: A | Discharge status: Home / Self Care | Payer: Medicare Other | Source: Ambulatory Visit | Attending: Gastroenterology | Admitting: Gastroenterology

## 2022-02-09 DIAGNOSIS — E119 Type 2 diabetes mellitus without complications: Secondary | ICD-10-CM | POA: Insufficient documentation

## 2022-02-09 DIAGNOSIS — Z794 Long term (current) use of insulin: Secondary | ICD-10-CM | POA: Diagnosis not present

## 2022-02-09 DIAGNOSIS — Z1211 Encounter for screening for malignant neoplasm of colon: Secondary | ICD-10-CM | POA: Insufficient documentation

## 2022-02-09 DIAGNOSIS — K648 Other hemorrhoids: Secondary | ICD-10-CM | POA: Diagnosis not present

## 2022-02-09 DIAGNOSIS — Z860100 Personal history of colon polyps, unspecified: Secondary | ICD-10-CM

## 2022-02-09 DIAGNOSIS — Z7984 Long term (current) use of oral hypoglycemic drugs: Secondary | ICD-10-CM | POA: Insufficient documentation

## 2022-02-09 DIAGNOSIS — I10 Essential (primary) hypertension: Secondary | ICD-10-CM | POA: Diagnosis not present

## 2022-02-09 DIAGNOSIS — Z79899 Other long term (current) drug therapy: Secondary | ICD-10-CM | POA: Insufficient documentation

## 2022-02-09 DIAGNOSIS — K5730 Diverticulosis of large intestine without perforation or abscess without bleeding: Secondary | ICD-10-CM | POA: Insufficient documentation

## 2022-02-09 HISTORY — PX: COLONOSCOPY WITH PROPOFOL: SHX5780

## 2022-02-09 HISTORY — DX: Dyspnea, unspecified: R06.00

## 2022-02-09 LAB — GLUCOSE, CAPILLARY: Glucose-Capillary: 146 mg/dL — ABNORMAL HIGH (ref 70–99)

## 2022-02-09 SURGERY — COLONOSCOPY WITH PROPOFOL
Anesthesia: General

## 2022-02-09 MED ORDER — LIDOCAINE 2% (20 MG/ML) 5 ML SYRINGE
INTRAMUSCULAR | Status: DC | PRN
Start: 2022-02-09 — End: 2022-02-09
  Administered 2022-02-09: 20 mg via INTRAVENOUS

## 2022-02-09 MED ORDER — PROPOFOL 500 MG/50ML IV EMUL
INTRAVENOUS | Status: DC | PRN
  Administered 2022-02-09: 120 ug/kg/min via INTRAVENOUS

## 2022-02-09 MED ORDER — PROPOFOL 10 MG/ML IV BOLUS
INTRAVENOUS | Status: DC | PRN
  Administered 2022-02-09: 70 mg via INTRAVENOUS
  Administered 2022-02-09: 30 mg via INTRAVENOUS

## 2022-02-09 MED ORDER — SODIUM CHLORIDE 0.9 % IV SOLN: INTRAVENOUS | Status: DC

## 2022-02-09 NOTE — Progress Notes (Signed)
? ? ?  Chronic Care Management ?Pharmacy Assistant  ? ?Name: Shari Lee  MRN: [id number] DOB: 05-20-1956 ? ?Insulin Dosage Adjustment ? ?I contacted the patient to inform her that per CPP after his reviewing of her numbers he would like her to decrease her Lantus for 30 units daily to 28 units daily. ? ?Patient did not answer her phone, and I was unable to leave a message as her voicemail was full. I will try to reach back out to her again later today. ? ?Patient called me back, and I instructed her that as long as her blood sugars were not elevated she can decrease her Lantus to 28 Units daily, and she will follow-up via telephone with CPP on 02/15/2022. Patient is agreeable and verbalized understanding to all. ? ?Medications: ?Facility-Administered Encounter Medications as of 02/09/2022  ?Medication  ? 0.9 %  sodium chloride infusion  ? ?Outpatient Encounter Medications as of 02/09/2022  ?Medication Sig  ? albuterol (VENTOLIN HFA) 108 (90 Base) MCG/ACT inhaler Inhale 2 puffs into the lungs every 6 (six) hours as needed for wheezing or shortness of breath.  ? amLODipine-benazepril (LOTREL) 5-20 MG capsule Take 1 capsule by mouth daily.  ? atenolol (TENORMIN) 50 MG tablet Take 1 tablet (50 mg total) by mouth daily.  ? Blood Glucose Monitoring Suppl (CONTOUR NEXT USB MONITOR) w/Device KIT 1 kit by Does not apply route daily. To check blood sugar once daily.  ? CareTouch Safety Lancets 26G MISC 1 Device by Does not apply route daily. To check blood sugar once daily.  ? dapagliflozin propanediol (FARXIGA) 10 MG TABS tablet Take 1 tablet (10 mg total) by mouth daily. Patient receives through [employer] Patient [employer]  ? glucose blood (CONTOUR NEXT TEST) test strip To check blood sugar once daily.  ? Insulin Glargine Solostar (LANTUS) 100 UNIT/ML Solostar Pen Inject 50 units into the skin daily. Patient receives through [employer] Patient Assistance (Patient taking differently: Inject 30 units into the skin daily. Patient  receives through [employer] Patient Assistance)  ? Insulin Pen Needle (BD PEN NEEDLE NANO 2ND GEN) 32G X 4 MM MISC To use with Lantus injections.  ? Insulin Pen Needle (NOVOFINE PLUS) 32G X 4 MM MISC To use with ozempic injections  ? omeprazole (PRILOSEC) 20 MG capsule Take 20 mg by mouth daily.  ? rosuvastatin (CRESTOR) 5 MG tablet Take 1 tablet (5 mg total) by mouth daily.  ? Semaglutide,0.25 or 0.5MG /DOS, (OZEMPIC, 0.25 OR 0.5 MG/DOSE,) 2 MG/1.5ML SOPN Inject 0.25 mg into the skin once a week. For four weeks and then increase to 0.5 mg weekly for four weeks  ? ? ?Lynann Bologna, CPA/CMA ?Clinical Pharmacist Assistant ?Phone: [phone]  ? ?

## 2022-02-09 NOTE — Anesthesia Postprocedure Evaluation (Signed)
Anesthesia Post Note ? ?Patient: Shari Lee ? ?Procedure(s) Performed: COLONOSCOPY WITH PROPOFOL ? ?Patient location during evaluation: Endoscopy ?Anesthesia Type: General ?Level of consciousness: awake and alert ?Pain management: pain level controlled ?Vital Signs Assessment: post-procedure vital signs reviewed and stable ?Respiratory status: spontaneous breathing, nonlabored ventilation, respiratory function stable and patient connected to nasal cannula oxygen ?Cardiovascular status: blood pressure returned to baseline and stable ?Postop Assessment: no apparent nausea or vomiting ?Anesthetic complications: no ? ? ?No notable events documented. ? ? ?Last Vitals:  ?Vitals:  ? 02/09/22 1104 02/09/22 1114  ?BP: 118/67 130/69  ?Pulse: 75 81  ?Resp: 19 16  ?Temp:    ?SpO2: 97% 100%  ?  ?Last Pain:  ?Vitals:  ? 02/09/22 1114  ?TempSrc:   ?PainSc: 0-No pain  ? ? ?  ?  ?  ?  ?  ?  ? ?Martha Clan ? ? ? ? ?

## 2022-02-09 NOTE — Anesthesia Preprocedure Evaluation (Signed)
Anesthesia Evaluation  ?Patient identified by MRN, date of birth, ID band ?Patient awake ? ? ? ?Reviewed: ?Allergy & Precautions, H&P , NPO status , Patient's Chart, lab work & pertinent test results, reviewed documented beta blocker date and time  ? ?History of Anesthesia Complications ?Negative for: history of anesthetic complications ? ?Airway ?Mallampati: II ? ?TM Distance: >3 FB ?Neck ROM: full ? ? ? Dental ? ?(+) Dental Advidsory Given, Caps, Teeth Intact, Missing ?  ?Pulmonary ?shortness of breath and with exertion, asthma , sleep apnea , neg COPD, neg recent URI,  ?  ?Pulmonary exam normal ?breath sounds clear to auscultation ? ? ? ? ? ? Cardiovascular ?Exercise Tolerance: Good ?hypertension, (-) angina(-) Past MI and (-) Cardiac Stents Normal cardiovascular exam(-) dysrhythmias + Valvular Problems/Murmurs  ?Rhythm:regular Rate:Normal ? ? ?  ?Neuro/Psych ?negative neurological ROS ? negative psych ROS  ? GI/Hepatic ?Neg liver ROS, GERD  ,  ?Endo/Other  ?diabetes ? Renal/GU ?negative Renal ROS  ?negative genitourinary ?  ?Musculoskeletal ? ? Abdominal ?  ?Peds ? Hematology ?negative hematology ROS ?(+)   ?Anesthesia Other Findings ?Past Medical History: ?No date: Diabetes mellitus without complication ([place]) ?No date: Dyspnea ?No date: Headache ?No date: Hypertension ? ? Reproductive/Obstetrics ?negative OB ROS ? ?  ? ? ? ? ? ? ? ? ? ? ? ? ? ?  ?  ? ? ? ? ? ? ? ? ?Anesthesia Physical ?Anesthesia Plan ? ?ASA: 3 ? ?Anesthesia Plan: General  ? ?Post-op Pain Management:   ? ?Induction: Intravenous ? ?PONV Risk Score and Plan: 3 and Propofol infusion and TIVA ? ?Airway Management Planned: Natural Airway and Nasal Cannula ? ?Additional Equipment:  ? ?Intra-op Plan:  ? ?Post-operative Plan:  ? ?Informed Consent: I have reviewed the patients History and Physical, chart, labs and discussed the procedure including the risks, benefits and alternatives for the proposed anesthesia with the  patient or authorized representative who has indicated his/her understanding and acceptance.  ? ? ? ?Dental Advisory Given ? ?Plan Discussed with: Anesthesiologist, CRNA and Surgeon ? ?Anesthesia Plan Comments:   ? ? ? ? ? ? ?Anesthesia Quick Evaluation ? ?

## 2022-02-09 NOTE — H&P (Signed)
? ?Lucilla Lame, MD [facility] ?[address] ?[place], [place] [address] ?Phone:[phone] ?Fax : [phone] ? ?Primary Care Physician:  [name], PA-C ?Primary Gastroenterologist:  Dr. Allen Norris ? ?Pre-Procedure History & Physical: ?HPI:  Shari Lee is a 65 y.o. female is here for an colonoscopy. ?  ?Past Medical History:  ?Diagnosis Date  ? Diabetes mellitus without complication ([place])   ? Dyspnea   ? Headache   ? Hypertension   ? ? ?Past Surgical History:  ?Procedure Laterality Date  ? ABDOMINAL HYSTERECTOMY  2006  ? BREAST BIOPSY Left 2005  ? benign  ? BREAST EXCISIONAL BIOPSY Left 2005?  ?  -benign  ? COLONOSCOPY WITH PROPOFOL N/A 08/13/2016  ? Procedure: COLONOSCOPY WITH PROPOFOL;  Surgeon: Lucilla Lame, MD;  Location: [facility];  Service: Endoscopy;  Laterality: N/A;  ? POLYPECTOMY  08/13/2016  ? Procedure: POLYPECTOMY;  Surgeon: Lucilla Lame, MD;  Location: [facility];  Service: Endoscopy;;  ? ? ?Prior to Admission medications   ?Medication Sig Start Date End Date Taking? Authorizing Provider  ?amLODipine-benazepril (LOTREL) 5-20 MG capsule Take 1 capsule by mouth daily. 01/18/22  Yes [name], PA-C  ?atenolol (TENORMIN) 50 MG tablet Take 1 tablet (50 mg total) by mouth daily. 10/06/21  Yes Mecum, Erin E, PA-C  ?dapagliflozin propanediol (FARXIGA) 10 MG TABS tablet Take 1 tablet (10 mg total) by mouth daily. Patient receives through [employer] Patient Assistance 01/18/22  Yes [name], PA-C  ?albuterol (VENTOLIN HFA) 108 (90 Base) MCG/ACT inhaler Inhale 2 puffs into the lungs every 6 (six) hours as needed for wheezing or shortness of breath. 10/05/20   Nance Pear, MD  ?Blood Glucose Monitoring Suppl (CONTOUR NEXT USB MONITOR) w/Device KIT 1 kit by Does not apply route daily. To check blood sugar once daily. 10/03/19   Mar Daring, PA-C  ?CareTouch Safety Lancets 26G MISC 1 Device by Does not apply route daily. To check blood sugar once daily. 10/03/19    Mar Daring, PA-C  ?glucose blood (CONTOUR NEXT TEST) test strip To check blood sugar once daily. 11/19/20   Mar Daring, PA-C  ?Insulin Glargine Solostar (LANTUS) 100 UNIT/ML Solostar Pen Inject 50 units into the skin daily. Patient receives through [employer] Patient Assistance ?Patient taking differently: Inject 30 units into the skin daily. Patient receives through [facility] Patient Assistance 01/18/22   [name], PA-C  ?Insulin Pen Needle (BD PEN NEEDLE NANO 2ND GEN) 32G X 4 MM MISC To use with Lantus injections. 01/09/21   Mar Daring, PA-C  ?Insulin Pen Needle (NOVOFINE PLUS) 32G X 4 MM MISC To use with ozempic injections 01/31/19   Mar Daring, PA-C  ?omeprazole (PRILOSEC) 20 MG capsule Take 20 mg by mouth daily.    [provider]  ?rosuvastatin (CRESTOR) 5 MG tablet Take 1 tablet (5 mg total) by mouth daily. 10/06/21   Mecum, Dani Gobble, PA-C  ?Semaglutide,0.25 or 0.5MG/DOS, (OZEMPIC, 0.25 OR 0.5 MG/DOSE,) 2 MG/1.5ML SOPN Inject 0.25 mg into the skin once a week. For four weeks and then increase to 0.5 mg weekly for four weeks 01/19/22   [name], PA-C  ? ? ?Allergies as of 01/07/2022 - Review Complete 01/07/2022  ?Allergen Reaction Noted  ? Amoxicillin-pot clavulanate  04/29/2015  ? Levsin  [hyoscyamine]  04/29/2015  ? Penicillins  04/29/2015  ? Metformin Diarrhea 07/16/2016  ? Metformin and related Diarrhea 07/16/2016  ? ? ?Family History  ?Problem Relation Age of Onset  ? Heart attack Mother   ?  Diabetes Mother   ? Heart disease Mother   ? Heart failure Father   ? Breast cancer Sister   ? Sleep apnea Brother   ? Diverticulitis Brother   ? Diverticulitis Sister   ? ? ?Social History  ? ?Socioeconomic History  ? Marital status: Divorced  ?  Spouse name: Not on file  ? Number of children: Not on file  ? Years of education: Not on file  ? Highest education level: Not on file  ?Occupational History  ? Not on file  ?Tobacco Use  ? Smoking status: Never  ? Smokeless  tobacco: Never  ?Vaping Use  ? Vaping Use: Never used  ?Substance and Sexual Activity  ? Alcohol use: No  ?  Alcohol/week: 0.0 standard drinks  ? Drug use: No  ? Sexual activity: Not on file  ?Other Topics Concern  ? Not on file  ?Social History Narrative  ? Not on file  ? ?Social Determinants of Health  ? ?Financial Resource Strain: High Risk  ? Difficulty of Paying Living Expenses: Very hard  ?Food Insecurity: Not on file  ?Transportation Needs: Not on file  ?Physical Activity: Not on file  ?Stress: Not on file  ?Social Connections: Not on file  ?Intimate Partner Violence: Not on file  ? ? ?Review of Systems: ?See HPI, otherwise negative ROS ? ?Physical Exam: ?BP 125/83   Pulse 86   Temp 98.5 ?F (36.9 ?C) (Temporal)   Resp 18   SpO2 100%  ?General:   Alert,  pleasant and cooperative in NAD ?Head:  Normocephalic and atraumatic. ?Neck:  Supple; no masses or thyromegaly. ?Lungs:  Clear throughout to auscultation.    ?Heart:  Regular rate and rhythm. ?Abdomen:  Soft, nontender and nondistended. Normal bowel sounds, without guarding, and without rebound.   ?Neurologic:  Alert and  oriented x4;  grossly normal neurologically. ? ?Impression/Plan: ?Shari Lee is here for an colonoscopy to be performed for a history of adenomatous polyps on 2017 ? ? ?Risks, benefits, limitations, and alternatives regarding  colonoscopy have been reviewed with the patient.  Questions have been answered.  All parties agreeable. ? ? ?Lucilla Lame, MD  02/09/2022, 10:19 AM ?

## 2022-02-09 NOTE — Op Note (Signed)
[facility] ?Gastroenterology ?Patient Name: Shari Lee ?Procedure Date: 02/09/2022 10:12 AM ?MRN: [id number] ?Account #: [id number] ?Date of Birth: [birth year] ?Admit Type: Outpatient ?Age: 66 ?Room: [facility] ENDO ROOM 4 ?Gender: Female ?Note Status: Finalized ?Instrument Name: Colonscope [id number] ?Procedure:             Colonoscopy ?Indications:           High risk colon cancer surveillance: Personal history  ?                       of colonic polyps ?Providers:             Lucilla Lame MD, MD ?Referring MD:          Sharen Counter ?Medicines:             Propofol per Anesthesia ?Complications:         No immediate complications. ?Procedure:             Pre-Anesthesia Assessment: ?                       - Prior to the procedure, a History and Physical was  ?                       performed, and patient medications and allergies were  ?                       reviewed. The patient's tolerance of previous  ?                       anesthesia was also reviewed. The risks and benefits  ?                       of the procedure and the sedation options and risks  ?                       were discussed with the patient. All questions were  ?                       answered, and informed consent was obtained. Prior  ?                       Anticoagulants: The patient has taken no previous  ?                       anticoagulant or antiplatelet agents. ASA Grade  ?                       Assessment: II - A patient with mild systemic disease.  ?                       After reviewing the risks and benefits, the patient  ?                       was deemed in satisfactory condition to undergo the  ?                       procedure. ?  After obtaining informed consent, the colonoscope was  ?                       passed under direct vision. Throughout the procedure,  ?                       the patient's blood pressure, pulse, and oxygen  ?                       saturations were monitored  continuously. The  ?                       Colonoscope was introduced through the anus and  ?                       advanced to the the cecum, identified by appendiceal  ?                       orifice and ileocecal valve. The colonoscopy was  ?                       performed without difficulty. The patient tolerated  ?                       the procedure well. The quality of the bowel  ?                       preparation was excellent. ?Findings: ?     The perianal and digital rectal examinations were normal. ?     Multiple small-mouthed diverticula were found in the sigmoid colon. ?     Non-bleeding internal hemorrhoids were found during retroflexion. The  ?     hemorrhoids were Grade I (internal hemorrhoids that do not prolapse). ?Impression:            - Diverticulosis in the sigmoid colon. ?                       - Non-bleeding internal hemorrhoids. ?                       - No specimens collected. ?Recommendation:        - Discharge patient to home. ?                       - Resume previous diet. ?                       - Continue present medications. ?                       - Repeat colonoscopy in 7 years for surveillance. ?Procedure Code(s):     --- Professional --- ?                       [phone], Colonoscopy, flexible; diagnostic, including  ?                       collection of specimen(s) by brushing or washing, when  ?  performed (separate procedure) ?Diagnosis Code(s):     --- Professional --- ?                       Z86.010, Personal history of colonic polyps ?CPT copyright 2019 American Medical Association. All rights reserved. ?The codes documented in this report are preliminary and upon coder review may  ?be revised to meet current compliance requirements. ?Lucilla Lame MD, MD ?02/09/2022 10:40:59 AM ?This report has been signed electronically. ?Number of Addenda: 0 ?Note Initiated On: 02/09/2022 10:12 AM ?Scope Withdrawal Time: 0 hours 7 minutes 59 seconds  ?Total Procedure  Duration: 0 hours 12 minutes 11 seconds  ?Estimated Blood Loss:  Estimated blood loss: none. ?     [facility] ?

## 2022-02-09 NOTE — Transfer of Care (Signed)
Immediate Anesthesia Transfer of Care Note ? ?Patient: Shari Lee ? ?Procedure(s) Performed: COLONOSCOPY WITH PROPOFOL ? ?Patient Location: Endoscopy Unit ? ?Anesthesia Type:General ? ?Level of Consciousness: drowsy ? ?Airway & Oxygen Therapy: Patient Spontanous Breathing ? ?Post-op Assessment: Report given to RN and Post -op Vital signs reviewed and stable ? ?Post vital signs: Reviewed ? ?Last Vitals:  ?Vitals Value Taken Time  ?BP    ?Temp    ?Pulse    ?Resp    ?SpO2    ? ? ?Last Pain:  ?Vitals:  ? 02/09/22 0940  ?TempSrc: Temporal  ?   ? ?  ? ?Complications: No notable events documented. ?

## 2022-02-10 ENCOUNTER — Encounter: Payer: Self-pay | Admitting: Gastroenterology

## 2022-02-11 ENCOUNTER — Ambulatory Visit: Payer: Medicare Other | Admitting: Cardiology

## 2022-02-11 ENCOUNTER — Encounter: Payer: Self-pay | Admitting: Cardiology

## 2022-02-11 VITALS — BP 114/66 | HR 87 | Ht 62.0 in | Wt 192.0 lb

## 2022-02-11 DIAGNOSIS — I10 Essential (primary) hypertension: Secondary | ICD-10-CM | POA: Diagnosis not present

## 2022-02-11 DIAGNOSIS — I350 Nonrheumatic aortic (valve) stenosis: Secondary | ICD-10-CM

## 2022-02-11 NOTE — Patient Instructions (Signed)
Medication Instructions:  ? ?Your physician recommends that you continue on your current medications as directed. Please refer to the Current Medication list given to you today.  ? ?*If you need a refill on your cardiac medications before your next appointment, please call your pharmacy* ? ? ?Lab Work: ? ?None ordered ? ?If you have labs (blood work) drawn today and your tests are completely normal, you will receive your results only by: ?MyChart Message (if you have MyChart) OR ?A paper copy in the mail ?If you have any lab test that is abnormal or we need to change your treatment, we will call you to review the results. ? ? ?Testing/Procedures: ? ?None Ordered ? ? ?Follow-Up: ?At [facility], you and your health needs are our priority.  As part of our continuing mission to provide you with exceptional heart care, we have created designated Provider Care Teams.  These Care Teams include your primary Cardiologist (physician) and Advanced Practice Providers (APPs -  Physician Assistants and Nurse Practitioners) who all work together to provide you with the care you need, when you need it. ? ?We recommend signing up for the patient portal called "MyChart".  Sign up information is provided on this After Visit Summary.  MyChart is used to connect with patients for Virtual Visits (Telemedicine).  Patients are able to view lab/test results, encounter notes, upcoming appointments, etc.  Non-urgent messages can be sent to your provider as well.   ?To learn more about what you can do with MyChart, go to [email or web address].   ? ?Your next appointment:   ?1 year(s) ? ?The format for your next appointment:   ?In Person ? ?Provider:   ?You may see Kate Sable, MD or one of the following Advanced Practice Providers on your designated Care Team:   ?Murray Hodgkins, NP ?Christell Faith, PA-C ?Cadence Kathlen Mody, PA-C  ? ? ?Other Instructions ? ? ?Important Information About Sugar ? ? ? ? ? ? ?

## 2022-02-11 NOTE — Progress Notes (Signed)
?Cardiology Office Note:   ? ?Date:  02/11/2022  ? ?ID:  Shari Lee, DOB [birth year], MRN [id number] ? ?PCP:  [name], PA-C ?  ?[facility] Providers ?Cardiologist:  Kate Sable, MD    ? ?Referring MD: [name], PA-C  ? ?Chief Complaint  ?Patient presents with  ? Other  ?  Follow up post ECHO -- Meds reviewed verbally with patient.   ? ? ?History of Present Illness:   ? ?Shari Lee is a 66 y.o. female with a hx of hypertension, diabetes who presents for follow-up.  Previously seen due to a cardiac murmur/schedule visit with PCP.   ? ?Echocardiogram was obtained to evaluate any structural abnormalities.  Patient clinically asymptomatic, denying chest pain, shortness of breath, palpitations.  Presents for echocardiogram results. ? ? ? ?Past Medical History:  ?Diagnosis Date  ? Diabetes mellitus without complication ([place])   ? Dyspnea   ? Headache   ? Hypertension   ? ? ?Past Surgical History:  ?Procedure Laterality Date  ? ABDOMINAL HYSTERECTOMY  2006  ? BREAST BIOPSY Left 2005  ? benign  ? BREAST EXCISIONAL BIOPSY Left 2005?  ?  -benign  ? COLONOSCOPY WITH PROPOFOL N/A 08/13/2016  ? Procedure: COLONOSCOPY WITH PROPOFOL;  Surgeon: Lucilla Lame, MD;  Location: [place];  Service: Endoscopy;  Laterality: N/A;  ? COLONOSCOPY WITH PROPOFOL N/A 02/09/2022  ? Procedure: COLONOSCOPY WITH PROPOFOL;  Surgeon: Lucilla Lame, MD;  Location: [facility] ENDOSCOPY;  Service: Endoscopy;  Laterality: N/A;  ? POLYPECTOMY  08/13/2016  ? Procedure: POLYPECTOMY;  Surgeon: Lucilla Lame, MD;  Location: [place];  Service: Endoscopy;;  ? ? ?Current Medications: ?Current Meds  ?Medication Sig  ? albuterol (VENTOLIN HFA) 108 (90 Base) MCG/ACT inhaler Inhale 2 puffs into the lungs every 6 (six) hours as needed for wheezing or shortness of breath.  ? amLODipine-benazepril (LOTREL) 5-20 MG capsule Take 1 capsule by mouth daily.  ? atenolol (TENORMIN) 50 MG tablet Take 1 tablet (50 mg total) by mouth daily.   ? Blood Glucose Monitoring Suppl (CONTOUR NEXT USB MONITOR) w/Device KIT 1 kit by Does not apply route daily. To check blood sugar once daily.  ? CareTouch Safety Lancets 26G MISC 1 Device by Does not apply route daily. To check blood sugar once daily.  ? dapagliflozin propanediol (FARXIGA) 10 MG TABS tablet Take 1 tablet (10 mg total) by mouth daily. Patient receives through [place] Patient Assistance  ? glucose blood (CONTOUR NEXT TEST) test strip To check blood sugar once daily.  ? Insulin Glargine Solostar (LANTUS) 100 UNIT/ML Solostar Pen Inject 50 units into the skin daily. Patient receives through [employer] Patient Assistance  ? Insulin Pen Needle (BD PEN NEEDLE NANO 2ND GEN) 32G X 4 MM MISC To use with Lantus injections.  ? Insulin Pen Needle (NOVOFINE PLUS) 32G X 4 MM MISC To use with ozempic injections  ? omeprazole (PRILOSEC) 20 MG capsule Take 20 mg by mouth daily.  ? rosuvastatin (CRESTOR) 5 MG tablet Take 1 tablet (5 mg total) by mouth daily.  ? Semaglutide,0.25 or 0.5MG/DOS, (OZEMPIC, 0.25 OR 0.5 MG/DOSE,) 2 MG/1.5ML SOPN Inject 0.25 mg into the skin once a week. For four weeks and then increase to 0.5 mg weekly for four weeks  ?  ? ?Allergies:   Amoxicillin-pot clavulanate, Levsin  [hyoscyamine], Penicillins, Metformin, and Metformin and related  ? ?Social History  ? ?Socioeconomic History  ? Marital status: Divorced  ?  Spouse name: Not on file  ? Number of  children: Not on file  ? Years of education: Not on file  ? Highest education level: Not on file  ?Occupational History  ? Not on file  ?Tobacco Use  ? Smoking status: Never  ? Smokeless tobacco: Never  ?Vaping Use  ? Vaping Use: Never used  ?Substance and Sexual Activity  ? Alcohol use: No  ?  Alcohol/week: 0.0 standard drinks  ? Drug use: No  ? Sexual activity: Not on file  ?Other Topics Concern  ? Not on file  ?Social History Narrative  ? Not on file  ? ?Social Determinants of Health  ? ?Financial Resource Strain: High Risk  ? Difficulty of Paying  Living Expenses: Very hard  ?Food Insecurity: Not on file  ?Transportation Needs: Not on file  ?Physical Activity: Not on file  ?Stress: Not on file  ?Social Connections: Not on file  ?  ? ?Family History: ?The patient's family history includes Breast cancer in her sister; Diabetes in her mother; Diverticulitis in her brother and sister; Heart attack in her mother; Heart disease in her mother; Heart failure in her father; Sleep apnea in her brother. ? ?ROS:   ?Please see the history of present illness.    ? All other systems reviewed and are negative. ? ?EKGs/Labs/Other Studies Reviewed:   ? ?The following studies were reviewed today: ? ? ?EKG:  EKG not ordered today.  ? ?Recent Labs: ?01/05/2022: ALT 18; BUN 15; Creatinine, Ser 0.98; Potassium 4.5; Sodium 136  ?Recent Lipid Panel ?   ?Component Value Date/Time  ? CHOL 165 01/05/2022 1611  ? TRIG 189 (H) 01/05/2022 1611  ? HDL 49 01/05/2022 1611  ? CHOLHDL 3.4 01/05/2022 1611  ? [place] 84 01/05/2022 1611  ? ? ? ?Risk Assessment/Calculations:   ? ? ?    ? ?Physical Exam:   ? ?VS:  BP 114/66 (BP Location: Left Arm, Patient Position: Sitting, Cuff Size: Normal)   Pulse 87   Ht 5' 2"  (1.575 m)   Wt 192 lb (87.1 kg)   SpO2 99%   BMI 35.12 kg/m?    ? ?Wt Readings from Last 3 Encounters:  ?02/11/22 192 lb (87.1 kg)  ?02/02/22 199 lb 4.8 oz (90.4 kg)  ?01/22/22 204 lb (92.5 kg)  ?  ? ?GEN:  Well nourished, well developed in no acute distress ?HEENT: Normal ?NECK: No JVD; No carotid bruits ?CARDIAC: RRR, 2/6 systolic murmur loudest at left sternal border. ?RESPIRATORY:  Clear to auscultation without rales, wheezing or rhonchi  ?ABDOMEN: Soft, non-tender, non-distended ?MUSCULOSKELETAL:  No edema; No deformity  ?SKIN: Warm and dry ?NEUROLOGIC:  Alert and oriented x 3 ?PSYCHIATRIC:  Normal affect  ? ?ASSESSMENT:   ? ?1. Aortic valve stenosis, etiology of cardiac valve disease unspecified   ?2. Primary hypertension   ? ? ?PLAN:   ? ?In order of problems listed  above: ? ?Systolic murmur, echo shows normal EF 60 to 65%, moderate aortic valve stenosis which is likely reason for heart murmur.  Mean gradient 25 mmHg repeat echo in 1 year. ?Hypertension, BP controlled.  Continue amlodipine, benazepril. ? ?Follow-up in 1 year after echocardiogram. ? ?   ? ?Medication Adjustments/Labs and Tests Ordered: ?Current medicines are reviewed at length with the patient today.  Concerns regarding medicines are outlined above.  ?No orders of the defined types were placed in this encounter. ? ?No orders of the defined types were placed in this encounter. ? ? ?Patient Instructions  ?Medication Instructions:  ? ?Your physician recommends that  you continue on your current medications as directed. Please refer to the Current Medication list given to you today.  ? ?*If you need a refill on your cardiac medications before your next appointment, please call your pharmacy* ? ? ?Lab Work: ? ?None ordered ? ?If you have labs (blood work) drawn today and your tests are completely normal, you will receive your results only by: ?MyChart Message (if you have MyChart) OR ?A paper copy in the mail ?If you have any lab test that is abnormal or we need to change your treatment, we will call you to review the results. ? ? ?Testing/Procedures: ? ?None Ordered ? ? ?Follow-Up: ?At [facility], you and your health needs are our priority.  As part of our continuing mission to provide you with exceptional heart care, we have created designated Provider Care Teams.  These Care Teams include your primary Cardiologist (physician) and Advanced Practice Providers (APPs -  Physician Assistants and Nurse Practitioners) who all work together to provide you with the care you need, when you need it. ? ?We recommend signing up for the patient portal called "MyChart".  Sign up information is provided on this After Visit Summary.  MyChart is used to connect with patients for Virtual Visits (Telemedicine).  Patients are able  to view lab/test results, encounter notes, upcoming appointments, etc.  Non-urgent messages can be sent to your provider as well.   ?To learn more about what you can do with MyChart, go to [email or web address].

## 2022-02-14 DIAGNOSIS — E1159 Type 2 diabetes mellitus with other circulatory complications: Secondary | ICD-10-CM

## 2022-02-14 DIAGNOSIS — I10 Essential (primary) hypertension: Secondary | ICD-10-CM

## 2022-02-14 DIAGNOSIS — Z794 Long term (current) use of insulin: Secondary | ICD-10-CM | POA: Diagnosis not present

## 2022-02-14 DIAGNOSIS — E785 Hyperlipidemia, unspecified: Secondary | ICD-10-CM

## 2022-02-15 ENCOUNTER — Ambulatory Visit (INDEPENDENT_AMBULATORY_CARE_PROVIDER_SITE_OTHER): Payer: Medicare Other

## 2022-02-15 DIAGNOSIS — E1169 Type 2 diabetes mellitus with other specified complication: Secondary | ICD-10-CM

## 2022-02-15 DIAGNOSIS — E1165 Type 2 diabetes mellitus with hyperglycemia: Secondary | ICD-10-CM

## 2022-02-15 DIAGNOSIS — E1159 Type 2 diabetes mellitus with other circulatory complications: Secondary | ICD-10-CM

## 2022-02-15 NOTE — Progress Notes (Signed)
? ?Chronic Care Management ?Pharmacy Note ? ?02/16/2022 ?Name:  Shari Lee MRN:  [id number] DOB:  August 15, 1956 ? ? ?Summary: ?Patient presents for CCM follow-up. Working on intermittent fasting. Tolerating Ozempic well, has noticed reduction in appetite.  ? ?-STOP-BANG Assessment score of 6-7 (high risk of sleep apnea).  ?-Patient does not have CPAP Machine  ? ? ?Recommendations/Changes made from today's visit: ?-Increase Ozempic to 0.5 mg weekly  ?-Decrease Lantus to 22 units daily  ? ?-Recommend referral for sleep study and re-evaluation of sleep apnea symptoms.  ? ?Plan: ?HC follow-up in 2 weeks to evaluate blood sugars  ?CPP follow-up 1 month  ? ?Subjective: ?Shari Lee is an 66 y.o. year old female who is a primary patient of Thedore Mins, Ria Comment, [place].  The CCM team was consulted for assistance with disease management and care coordination needs.   ? ?Engaged with patient by telephone for follow up visit in response to provider referral for pharmacy case management and/or care coordination services.  ? ?Consent to Services:  ?The patient was given information about Chronic Care Management services, agreed to services, and gave verbal consent prior to initiation of services.  Please see initial visit note for detailed documentation.  ? ?Patient Care Team: ?Emelia Loron as PCP - General (Physician Assistant) ?Kate Sable, MD as PCP - Cardiology (Cardiology) ?Germaine Pomfret, [facility] (Pharmacist) ? ?Recent office visits: ?01/05/22: Patient presented to [name], PA-C for follow-up. A1c 11%.  ? ?Recent consult visits: ?None in past 6 months.  ? ?Hospital visits: ?None in past 6 months  ? ?Objective: ? ?Lab Results  ?Component Value Date  ? CREATININE 0.98 01/05/2022  ? BUN 15 01/05/2022  ? GFRNONAA >60 10/13/2020  ? GFRAA 60 10/01/2020  ? NA 136 01/05/2022  ? K 4.5 01/05/2022  ? CALCIUM 9.7 01/05/2022  ? CO2 23 01/05/2022  ? GLUCOSE 321 (H) 01/05/2022  ? ? ?Lab Results  ?Component Value  Date/Time  ? HGBA1C 11.3 (H) 01/05/2022 04:11 PM  ? HGBA1C 9.2 (A) 10/06/2021 04:17 PM  ? HGBA1C 9.3 (A) 04/16/2021 04:02 PM  ? HGBA1C 13.5 (H) 10/01/2020 02:35 PM  ? MICROALBUR negative 04/16/2021 04:09 PM  ? MICROALBUR negative 12/15/2018 09:10 AM  ?  ?Last diabetic Eye exam:  ?Lab Results  ?Component Value Date/Time  ? HMDIABEYEEXA No Retinopathy 07/31/2018 12:00 AM  ?  ?Last diabetic Foot exam: No results found for: HMDIABFOOTEX  ? ?Lab Results  ?Component Value Date  ? CHOL 165 01/05/2022  ? HDL 49 01/05/2022  ? [place] 84 01/05/2022  ? TRIG 189 (H) 01/05/2022  ? CHOLHDL 3.4 01/05/2022  ? ? ? ?  Latest Ref Rng & Units 01/05/2022  ?  4:11 PM 10/12/2020  ?  4:27 AM 10/11/2020  ?  4:32 AM  ?Hepatic Function  ?Total Protein 6.0 - 8.5 g/dL 7.3   5.3   5.2    ?Albumin 3.8 - 4.8 g/dL 4.7   2.4   2.4    ?AST 0 - 40 IU/L 19   38   46    ?ALT 0 - 32 IU/L 18   44   44    ?Alk Phosphatase 44 - 121 IU/L 114   43   51    ?Total Bilirubin 0.0 - 1.2 mg/dL 0.3   0.7   0.7    ? ? ?Lab Results  ?Component Value Date/Time  ? TSH 2.410 10/01/2020 02:35 PM  ? TSH 2.090 12/15/2018 08:05 AM  ? ? ? ?  Latest Ref Rng & Units 10/13/2020  ?  4:15 AM 10/12/2020  ?  4:27 AM 10/11/2020  ?  4:32 AM  ?CBC  ?WBC 4.0 - 10.5 K/uL 11.3   18.2   15.9    ?Hemoglobin 12.0 - 15.0 g/dL 12.3   12.3   12.0    ?Hematocrit 36.0 - 46.0 % 36.9   35.4   35.8    ?Platelets 150 - 400 K/uL 388   417   410    ? ? ?No results found for: VD25OH ? ?Clinical ASCVD: No  ?The 10-year ASCVD risk score (Arnett DK, et al., 2019) is: 10.6% ?  Values used to calculate the score: ?    Age: 66 years ?    Sex: Female ?    Is Non-Hispanic African American: No ?    Diabetic: Yes ?    Tobacco smoker: No ?    Systolic Blood Pressure: 355 mmHg ?    Is BP treated: Yes ?    HDL Cholesterol: 49 mg/dL ?    Total Cholesterol: 165 mg/dL   ? ? ?  01/05/2022  ?  3:15 PM 10/27/2020  ?  4:34 PM 10/01/2020  ?  1:55 PM  ?Depression screen PHQ 2/9  ?Decreased Interest 0 0 0  ?Down, Depressed,  Hopeless 0 0 0  ?PHQ - 2 Score 0 0 0  ?Altered sleeping 3 0   ?Tired, decreased energy 0 0   ?Change in appetite 0 0   ?Feeling bad or failure about yourself  0 0   ?Trouble concentrating 0 0   ?Moving slowly or fidgety/restless 0 0   ?Suicidal thoughts 0 0   ?PHQ-9 Score 3 0   ?Difficult doing work/chores Not difficult at all Not difficult at all   ?  ? ?Social History  ? ?Tobacco Use  ?Smoking Status Never  ?Smokeless Tobacco Never  ? ?BP Readings from Last 3 Encounters:  ?02/11/22 114/66  ?02/09/22 130/69  ?02/02/22 (!) 106/55  ? ?Pulse Readings from Last 3 Encounters:  ?02/11/22 87  ?02/09/22 81  ?02/02/22 84  ? ?Wt Readings from Last 3 Encounters:  ?02/11/22 192 lb (87.1 kg)  ?02/02/22 199 lb 4.8 oz (90.4 kg)  ?01/22/22 204 lb (92.5 kg)  ? ?BMI Readings from Last 3 Encounters:  ?02/11/22 35.12 kg/m?  ?02/02/22 36.45 kg/m?  ?01/22/22 37.31 kg/m?  ? ? ?Assessment/Interventions: Review of patient past medical history, allergies, medications, health status, including review of consultants reports, laboratory and other test data, was performed as part of comprehensive evaluation and provision of chronic care management services.  ? ?SDOH:  (Social Determinants of Health) assessments and interventions performed: Yes ? ? ? ? ?[place] ? ?Allergies  ?Allergen Reactions  ? Amoxicillin-Pot Clavulanate   ? Levsin  [Hyoscyamine]   ?  Other reaction(s): Dizzyness  ? Penicillins   ? Metformin Diarrhea  ? Metformin And Related Diarrhea  ? ? ?Medications Reviewed Today   ? ? Reviewed by Kate Sable, MD (Physician) on 02/11/22 at 1004  Med List Status: <None>  ? ?Medication Order Taking? Sig Documenting Provider Last Dose Status Informant  ?albuterol (VENTOLIN HFA) 108 (90 Base) MCG/ACT inhaler [id number] Yes Inhale 2 puffs into the lungs every 6 (six) hours as needed for wheezing or shortness of breath. Nance Pear, MD Taking Active   ?amLODipine-benazepril (LOTREL) 5-20 MG capsule [id number] Yes Take 1 capsule  by mouth daily. [name], PA-C Taking Active   ?atenolol (TENORMIN) 50 MG  tablet [id number] Yes Take 1 tablet (50 mg total) by mouth daily. Mecum, Dani Gobble, PA-C Taking Active   ?Blood Glucose Monitoring Suppl (CONTOUR NEXT USB MONITOR) w/Device KIT [id number] Yes 1 kit by Does not apply route daily. To check blood sugar once daily. Mar Daring, PA-C Taking Active   ?CareTouch Safety Lancets 26G MISC [id number] Yes 1 Device by Does not apply route daily. To check blood sugar once daily. Mar Daring, PA-C Taking Active   ?dapagliflozin propanediol (FARXIGA) 10 MG TABS tablet [id number] Yes Take 1 tablet (10 mg total) by mouth daily. Patient receives through [employer] Patient Assistance [name], PA-C Taking Active   ?glucose blood (CONTOUR NEXT TEST) test strip [id number] Yes To check blood sugar once daily. Mar Daring, PA-C Taking Active   ?Insulin Glargine Solostar (LANTUS) 100 UNIT/ML Solostar Pen [id number] Yes Inject 50 units into the skin daily. Patient receives through [facility] Patient Assistance [name], PA-C Taking Active Self  ?Insulin Pen Needle (BD PEN NEEDLE NANO 2ND GEN) 32G X 4 MM MISC [id number] Yes To use with Lantus injections. Mar Daring, PA-C Taking Active   ?Insulin Pen Needle (NOVOFINE PLUS) 32G X 4 MM MISC [id number] Yes To use with ozempic injections Mar Daring, PA-C Taking Active   ?omeprazole (PRILOSEC) 20 MG capsule [id number] Yes Take 20 mg by mouth daily. [provider] Taking Active Self  ?rosuvastatin (CRESTOR) 5 MG tablet [id number] Yes Take 1 tablet (5 mg total) by mouth daily. Mecum, Dani Gobble, PA-C Taking Active   ?Semaglutide,0.25 or 0.5MG/DOS, (OZEMPIC, 0.25 OR 0.5 MG/DOSE,) 2 MG/1.5ML SOPN [id number] Yes Inject 0.25 mg into the skin once a week. For four weeks and then increase to 0.5 mg weekly for four weeks [name], PA-C Taking Active   ? ?  ?  ? ?  ? ? ?Patient Active Problem List  ? Diagnosis Date Noted  ?  History of colonic polyps   ? Pain of left breast 02/02/2022  ? Aortic stenosis 02/02/2022  ? Murmur, cardiac 01/06/2022  ? 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 Type 2 diabetes mellitus with hyperglycemia ([place]) 04/29/19

## 2022-02-16 ENCOUNTER — Telehealth: Payer: Self-pay

## 2022-02-16 MED ORDER — INSULIN GLARGINE SOLOSTAR 100 UNIT/ML ~~LOC~~ SOPN: PEN_INJECTOR | SUBCUTANEOUS | 3 refills | Status: DC

## 2022-02-16 MED ORDER — OZEMPIC (0.25 OR 0.5 MG/DOSE) 2 MG/1.5ML ~~LOC~~ SOPN: 0.5000 mg | PEN_INJECTOR | SUBCUTANEOUS | Status: DC

## 2022-02-16 NOTE — Patient Instructions (Signed)
Visit Information ?It was great speaking with you today!  Please let me know if you have any questions about our visit. ? ? Goals Addressed   ? ?  ?  ?  ?  ? This Visit's Progress  ?  Monitor and Manage My Blood Sugar-Diabetes Type 2   On track  ?  Timeframe:  Long-Range Goal ?Priority:  High ?Start Date: 11/21/2020                            ?Expected End Date: 07/21/2021                     ? ?Follow Up within 30 days ?  ?- check blood sugar at prescribed times ?- check blood sugar if I feel it is too high or too low ?- enter blood sugar readings and medication or insulin into daily log ?- take the blood sugar log to all doctor visits  ?  ?Why is this important?   ?Checking your blood sugar at home helps to keep it from getting very high or very low.  ?Writing the results in a diary or log helps the doctor know how to care for you.  ?Your blood sugar log should have the time, date and the results.  ?Also, write down the amount of insulin or other medicine that you take.  ?Other information, like what you ate, exercise done and how you were feeling, will also be helpful.   ?  ?  Track and Manage My Blood Pressure-Hypertension   On track  ?  Timeframe:  Long-Range Goal ?Priority:  High ?Start Date: 03/25/2021                             ?Expected End Date: 09/24/2022                     ? ?Follow Up within 30 days ?  ?- check blood pressure 3 times per week  ?  ?Why is this important?   ?You won't feel high blood pressure, but it can still hurt your blood vessels.  ?High blood pressure can cause heart or kidney problems. It can also cause a stroke.  ?Making lifestyle changes like losing a little weight or eating less salt will help.  ?Checking your blood pressure at home and at different times of the day can help to control blood pressure.  ?If the doctor prescribes medicine remember to take it the way the doctor ordered.  ?Call the office if you cannot afford the medicine or if there are questions about it.   ?  ?Notes:   ?  ? ?  ? ? ?Patient Care Plan: General Pharmacy (Adult)  ?  ? ?Problem Identified: Hypertension, Hyperlipidemia, Diabetes   ?Priority: High  ?  ? ?Long-Range Goal: Patient-Specific Goal   ?Start Date: 11/21/2020  ?Expected End Date: 01/19/2023  ?This Visit's Progress: On track  ?Recent Progress: On track  ?Priority: High  ?Note:   ?Current Barriers:  ?Unable to independently afford treatment regimen ?Unable to achieve control of Diabetes   ? ?Pharmacist Clinical Goal(s):  ?Over the next 90 days, patient will achieve control of diabetes as evidenced by A1c <8% through collaboration with PharmD and provider.  ? ?Interventions: ?1:1 collaboration with [name], PA-C regarding development and update of comprehensive plan of care as evidenced by provider attestation and  co-signature ?Inter-disciplinary care team collaboration (see longitudinal plan of care) ?Comprehensive medication review performed; medication list updated in electronic medical record ? ?Hypertension (BP goal <130/80) ?-Uncontrolled ?-Current treatment: ?Amlodipine-benazepril 5-10 mg daily: Appropriate, Query effective ?Atenolol 50 mg daily: Query appropriate ?-Medications previously tried: NA  ?-Previously took Amlodipine-benazepril 5-20, prescribed incorrect dose.  ?-Current home readings: 140-147/70-80 ?-Denies hypotensive/hypertensive symptoms ?-INCREASE amlodipine-benazepril back to 5-20 mg daily.  ?-Recommended to continue current medication ? ?Hyperlipidemia: (LDL goal < 70) ?-uncontrolled ?-Current treatment: ?Rosuvastatin 5 mg daily: Appropriate, Query effective ?-Medications previously tried: NA  ?-Will defer adjusting rosuvastatin dose due to other changes.  ?-Continue current medications  ? ?Diabetes (A1c goal <8%) ?Diagnosed 2005  ?-uncontrolled ?-Current medications: ?Glyburide 5 mg daily: Query Appropriate  ?Lantus 45 units daily (0.50 u/kg): Appropriate, Query effective ?-Medications previously tried: Metformin (Diarrhea),  [place], Ozempic (ineffective)   ?-Ran out of [name], has not received first shipment from PAP.  ?-Current home glucose readings ? Fasting  ?3-Apr 238  ?2-Apr 191  ?1-Apr 233  ?31-Mar 188  ?30-Mar 187  ?29-Mar 188  ?28-Mar 166  ?27-Mar 207  ?26-Mar 262  ?Average 207  ?-Denies hypoglycemic symptoms ?-Current meal patterns: Working on intermittent fasting.  ?-Current exercise: 3000 steps daily, goal 5000.  ?-Patient has not noticed significantly change in blood sugar since running out of [place], discussed secondary benefits including potential CKD prevention  ?-Glyburide not recommended in age >41 due to hypoglycemia risk  ?-Lantus at maximum recommended dose (0.5 u/kg), but will increase slightly today while working on other medication changes.  ?-RESTART [place]. Samples left at front desk and samples of [name] in the fridge.  ?-INCREASE Lantus to 50 units daily  ?-Recommend retrial of Ozempic 0.25 mg weekly (with PAP). Will stop Glyburide once treatment dose of Ozempic reached.  ?-Recommend switching Lantus to [place].  ? ?Obstructive Sleep Apnea (Goal: Improve sleep quality) ?-Uncontrolled ?-Current treatment  ?None  ?-Medications previously tried: Melatonin (ineffective)  ?-Patient reports poor sleep quality. She typically gets 3-4 hours of sleep nightly and wakes up 2-3 times nightly.  ?-STOP-BANG Assessment score of 6-7 (high risk of sleep apnea).  ?-Patient does not have CPAP Machine  ?-Recommend referral for sleep study and re-evaluation of sleep apnea symptoms.  ? ? ?Patient Goals/Self-Care Activities ?Over the next 90 days, patient will:  ?- check glucose daily, document, and provide at future appointments ?check blood pressure weekly, document, and provide at future appointments ? ?Follow Up Plan: Telephone follow up appointment with care management team member scheduled for:  02/15/2021 at 3:00 PM ?  ? ? ? ?Patient agreed to services and verbal consent obtained.  ? ?Patient verbalizes understanding of  instructions and care plan provided today and agrees to view in [place]. Active MyChart status confirmed with patient.   ? ?[name], PharmD, BCACP, CPP  ?Clinical Pharmacist Practitioner  ?[place] ?[phone]  ?

## 2022-03-04 ENCOUNTER — Telehealth: Payer: Self-pay

## 2022-03-04 NOTE — Progress Notes (Signed)
Chronic Care Management Pharmacy Assistant   Name: Shari Lee  MRN: [id number] DOB: [birth year]  Reason for Encounter: Follow-up with patient regarding her latest Blood Sugar Numbers/Patient Assistance Application   Recent office visits:  None ID  Recent consult visits:  None ID  Hospital visits:  None in previous 6 months  Medications: Outpatient Encounter Medications as of 03/04/2022  Medication Sig   albuterol (VENTOLIN HFA) 108 (90 Base) MCG/ACT inhaler Inhale 2 puffs into the lungs every 6 (six) hours as needed for wheezing or shortness of breath.   amLODipine-benazepril (LOTREL) 5-20 MG capsule Take 1 capsule by mouth daily.   atenolol (TENORMIN) 50 MG tablet Take 1 tablet (50 mg total) by mouth daily.   Blood Glucose Monitoring Suppl (CONTOUR NEXT USB MONITOR) w/Device KIT 1 kit by Does not apply route daily. To check blood sugar once daily.   CareTouch Safety Lancets 26G MISC 1 Device by Does not apply route daily. To check blood sugar once daily.   dapagliflozin propanediol (FARXIGA) 10 MG TABS tablet Take 1 tablet (10 mg total) by mouth daily. Patient receives through [place] Patient Assistance   glucose blood (CONTOUR NEXT TEST) test strip To check blood sugar once daily.   Insulin Glargine Solostar (LANTUS) 100 UNIT/ML Solostar Pen Inject 22 units into the skin daily. Patient receives through [employer] Patient Assistance   Insulin Pen Needle (BD PEN NEEDLE NANO 2ND GEN) 32G X 4 MM MISC To use with Lantus injections.   Insulin Pen Needle (NOVOFINE PLUS) 32G X 4 MM MISC To use with ozempic injections   omeprazole (PRILOSEC) 20 MG capsule Take 20 mg by mouth daily.   rosuvastatin (CRESTOR) 5 MG tablet Take 1 tablet (5 mg total) by mouth daily.   Semaglutide,0.25 or 0.5MG/DOS, (OZEMPIC, 0.25 OR 0.5 MG/DOSE,) 2 MG/1.5ML SOPN Inject 0.5 mg into the skin once a week.   No facility-administered encounter medications on file as of 03/04/2022.    Care Gaps: Zoster  Vaccines COVID-19 Vaccine Diabetic Eye Exam PAP Smear Colonoscopy (last completed 08/13/2016 most insurances cover every 10 years unless high risk) Tetanus/TDAP PNA Vaccine BP > 140/90 Mammogram Dexa Scan A1C > 9  Star Rating Drugs: Farxiga 10 mg patient on patient assistance through [place] Rosuvastatin 5 mg last filled on 01/09/2022 for a 90-Day supply with [place] Amlodipine-Benazepril 5-10 mg last filled on 01/09/2022 for a 90-Day supply with [place] spoke to the patient and she advised she received her first shipment of [place] and [name] yesterday. Patient stated her blood sugar number this morning was 129, but she could not provide more numbers as she was not home.   Patient stated that she would give me a call later to give me her last five blood sugar numbers so I can provide them to the CPP  03/08/2022  I spoke with the patient this morning and she advises her blood sugars have been a little more elevated than she would like. She reports that last night instead of 22 units she injected herself with 30 units of insulin. She stated she went up due to the last few days her numbers have been elevated and this morning after doing 30 units last night her blood sugars was 118 fasting. She stated Sunday 03/07/2022 they were 159  05/20 151 05/19 154 05/18 142 05/17 128   Patient reports she doesn't eat after 7:30 pm, and on Friday night she had 1/2 cup of black eye peas, cabbage and polish  sausage with cheese. That morning she had a protein shake and she did drink about 4 bottles of water during the day. Patient tries to get in 3000-5000 steps daily. Patient stated that with her meal her blood sugars only decreased by 3 and she was wondering why it didn't go down more during the night? I informed her that I would speak with PCP about her concern, and give her a call back if there are any changes.   Patient has no other issues or concerns at this time.  Lynann Bologna, CPA/CMA Clinical Pharmacist Assistant Phone: [phone]

## 2022-03-08 ENCOUNTER — Telehealth: Payer: Self-pay

## 2022-03-08 NOTE — Progress Notes (Signed)
    Chronic Care Management Pharmacy Assistant   Name: Arbell Wycoff  MRN: [id number] DOB: 1955/11/14  DM Follow-up Instructions per CPP   [name], CPP responded to my progress note with updated instructions regarding the patient and how she is taking her diabetic medications.  Per CPP he would like me to:  Inform the patient that her fasting blood sugar average over the last week was 142, which is very close to our goal. Please advise her to stay at 30 units of Lantus until our next follow-up. As to her blood sugars overnight or throughout the day, we will discuss more at our next follow-up. For now, if she would like a better idea of what happens throughout the day, she can incorporate an additional blood sugar test 1-2 hours after her largest meal which will show [place] how elevated her blood sugars get after eating.  I spoke to the patient, and informed her that her average blood sugar is 142 which is close to her goal. I also advised her to increase her Lantus to 30 Units until her next follow-up with CPP on 03/22/2022 @ 1415. I advised patient that during that visit Cristie Hem will discuss more about her blood sugars for overnight and throughout the day. I educated patient that if she would like to have a better idea of what is happening throughout the day with her blood sugars per CPP she can incorporate an additional blood sugar test 1-2 hours after her largest meal and that will show how elevated her blood sugars get after eating. I advised her if she does this to make sure to write those numbers down to give to CPP at next visit. Patient verbalized understanding to all, and encouraged to give me a call if she has any additional questions.   Medications: Outpatient Encounter Medications as of 03/08/2022  Medication Sig   albuterol (VENTOLIN HFA) 108 (90 Base) MCG/ACT inhaler Inhale 2 puffs into the lungs every 6 (six) hours as needed for wheezing or shortness of breath.   amLODipine-benazepril  (LOTREL) 5-20 MG capsule Take 1 capsule by mouth daily.   atenolol (TENORMIN) 50 MG tablet Take 1 tablet (50 mg total) by mouth daily.   Blood Glucose Monitoring Suppl (CONTOUR NEXT USB MONITOR) w/Device KIT 1 kit by Does not apply route daily. To check blood sugar once daily.   CareTouch Safety Lancets 26G MISC 1 Device by Does not apply route daily. To check blood sugar once daily.   dapagliflozin propanediol (FARXIGA) 10 MG TABS tablet Take 1 tablet (10 mg total) by mouth daily. Patient receives through [employer] Patient Assistance   glucose blood (CONTOUR NEXT TEST) test strip To check blood sugar once daily.   Insulin Glargine Solostar (LANTUS) 100 UNIT/ML Solostar Pen Inject 22 units into the skin daily. Patient receives through [employer] Patient Assistance   Insulin Pen Needle (BD PEN NEEDLE NANO 2ND GEN) 32G X 4 MM MISC To use with Lantus injections.   Insulin Pen Needle (NOVOFINE PLUS) 32G X 4 MM MISC To use with ozempic injections   omeprazole (PRILOSEC) 20 MG capsule Take 20 mg by mouth daily.   rosuvastatin (CRESTOR) 5 MG tablet Take 1 tablet (5 mg total) by mouth daily.   Semaglutide,0.25 or 0.5MG /DOS, (OZEMPIC, 0.25 OR 0.5 MG/DOSE,) 2 MG/1.5ML SOPN Inject 0.5 mg into the skin once a week.   No facility-administered encounter medications on file as of 03/08/2022.    Lynann Bologna, CPA/CMA Clinical Pharmacist Assistant Phone: [phone]

## 2022-03-17 DIAGNOSIS — E785 Hyperlipidemia, unspecified: Secondary | ICD-10-CM | POA: Diagnosis not present

## 2022-03-17 DIAGNOSIS — Z794 Long term (current) use of insulin: Secondary | ICD-10-CM

## 2022-03-17 DIAGNOSIS — Z7984 Long term (current) use of oral hypoglycemic drugs: Secondary | ICD-10-CM

## 2022-03-17 DIAGNOSIS — I152 Hypertension secondary to endocrine disorders: Secondary | ICD-10-CM

## 2022-03-17 DIAGNOSIS — E1169 Type 2 diabetes mellitus with other specified complication: Secondary | ICD-10-CM | POA: Diagnosis not present

## 2022-03-19 ENCOUNTER — Telehealth: Payer: Self-pay

## 2022-03-19 NOTE — Progress Notes (Signed)
    Chronic Care Management Pharmacy Assistant   Name: Shari Lee  MRN: [id number] DOB: 12/19/1955  Patient called to be reminded of her telephone appointment with [name], CPP on 03/22/2022 @ 1415.  Patient aware of appointment date, time, and type of appointment. Patient aware to have/bring all medications, supplements, blood pressure and/or blood sugar logs to visit.  Star Rating Drug: Farxiga 10 mg patient on patient assistance through [place] Rosuvastatin 5 mg last filled on 01/09/2022 for a 90-Day supply with [place] Amlodipine-Benazepril 5-10 mg last filled on 01/09/2022 for a 90-Day supply with [place]  Any gaps in medications fill history? No  Care Gaps: Zoster Vaccines COVID-19 Vaccine Diabetic Eye Exam PAP Smear Colonoscopy (last completed 08/13/2016 most insurances cover every 10 years unless high risk) Tetanus/TDAP PNA Vaccine BP > 140/90 Mammogram Dexa Scan A1C > [place], CPA/CMA Catering manager Phone: [phone]

## 2022-03-22 ENCOUNTER — Ambulatory Visit (INDEPENDENT_AMBULATORY_CARE_PROVIDER_SITE_OTHER): Payer: Medicare Other

## 2022-03-22 DIAGNOSIS — G4730 Sleep apnea, unspecified: Secondary | ICD-10-CM

## 2022-03-22 DIAGNOSIS — E1169 Type 2 diabetes mellitus with other specified complication: Secondary | ICD-10-CM

## 2022-03-22 DIAGNOSIS — E1165 Type 2 diabetes mellitus with hyperglycemia: Secondary | ICD-10-CM

## 2022-03-22 DIAGNOSIS — Z794 Long term (current) use of insulin: Secondary | ICD-10-CM

## 2022-03-22 NOTE — Progress Notes (Signed)
Chronic Care Management Pharmacy Note  03/30/2022 Name:  Shari Lee MRN:  [id number] DOB:  1956-06-28   Summary: Patient presents for CCM follow-up. Working on intermittent fasting. Tolerating Ozempic well, has noticed reduction in appetite.   -STOP-BANG Assessment score of 6-7 (high risk of sleep apnea).  -Patient does not have CPAP Machine    Recommendations/Changes made from today's visit: -Increase Lantus to 32 units daily  -Patient interested in [facility] 2, Rx sent in.   -Recommend referral for sleep study and re-evaluation of sleep apnea symptoms.   Plan: HC follow-up in 2 weeks to evaluate blood sugars  CPP follow-up 1 month   Subjective: Shari Lee is an 66 y.o. year old female who is a primary patient of Thedore Mins, Ria Comment, [place].  The CCM team was consulted for assistance with disease management and care coordination needs.    Engaged with patient by telephone for follow up visit in response to provider referral for pharmacy case management and/or care coordination services.   Consent to Services:  The patient was given information about Chronic Care Management services, agreed to services, and gave verbal consent prior to initiation of services.  Please see initial visit note for detailed documentation.   Patient Care Team: [name], PA-C as PCP - General (Physician Assistant) Kate Sable, MD as PCP - Cardiology (Cardiology) Germaine Pomfret, [facility] (Pharmacist)  Recent office visits: 01/05/22: Patient presented to [name], PA-C for follow-up. A1c 11%.   Recent consult visits: None in past 6 months.   Hospital visits: None in past 6 months   Objective:  Lab Results  Component Value Date   CREATININE 0.98 01/05/2022   BUN 15 01/05/2022   GFRNONAA >60 10/13/2020   GFRAA 60 10/01/2020   NA 136 01/05/2022   K 4.5 01/05/2022   CALCIUM 9.7 01/05/2022   CO2 23 01/05/2022   GLUCOSE 321 (H) 01/05/2022    Lab Results   Component Value Date/Time   HGBA1C 11.3 (H) 01/05/2022 04:11 PM   HGBA1C 9.2 (A) 10/06/2021 04:17 PM   HGBA1C 9.3 (A) 04/16/2021 04:02 PM   HGBA1C 13.5 (H) 10/01/2020 02:35 PM   MICROALBUR negative 04/16/2021 04:09 PM   MICROALBUR negative 12/15/2018 09:10 AM    Last diabetic Eye exam:  Lab Results  Component Value Date/Time   HMDIABEYEEXA No Retinopathy 07/31/2018 12:00 AM    Last diabetic Foot exam: No results found for: "HMDIABFOOTEX"   Lab Results  Component Value Date   CHOL 165 01/05/2022   HDL 49 01/05/2022   LDLCALC 84 01/05/2022   TRIG 189 (H) 01/05/2022   CHOLHDL 3.4 01/05/2022       Latest Ref Rng & Units 01/05/2022    4:11 PM 10/12/2020    4:27 AM 10/11/2020    4:32 AM  Hepatic Function  Total Protein 6.0 - 8.5 g/dL 7.3  5.3  5.2   Albumin 3.8 - 4.8 g/dL 4.7  2.4  2.4   AST 0 - 40 IU/L 19  38  46   ALT 0 - 32 IU/L 18  44  44   Alk Phosphatase 44 - 121 IU/L 114  43  51   Total Bilirubin 0.0 - 1.2 mg/dL 0.3  0.7  0.7     Lab Results  Component Value Date/Time   TSH 2.410 10/01/2020 02:35 PM   TSH 2.090 12/15/2018 08:05 AM       Latest Ref Rng & Units 10/13/2020    4:15 AM 10/12/2020  4:27 AM 10/11/2020    4:32 AM  CBC  WBC 4.0 - 10.5 K/uL 11.3  18.2  15.9   Hemoglobin 12.0 - 15.0 g/dL 12.3  12.3  12.0   Hematocrit 36.0 - 46.0 % 36.9  35.4  35.8   Platelets 150 - 400 K/uL 388  417  410     No results found for: "VD25OH"  Clinical ASCVD: No  The 10-year ASCVD risk score (Arnett DK, et al., 2019) is: 10.6%   Values used to calculate the score:     Age: 36 years     Sex: Female     Is Non-Hispanic African American: No     Diabetic: Yes     Tobacco smoker: No     Systolic Blood Pressure: 329 mmHg     Is BP treated: Yes     HDL Cholesterol: 49 mg/dL     Total Cholesterol: 165 mg/dL       01/05/2022    3:15 PM 10/27/2020    4:34 PM 10/01/2020    1:55 PM  Depression screen PHQ 2/9  Decreased Interest 0 0 0  Down, Depressed, Hopeless 0 0  0  PHQ - 2 Score 0 0 0  Altered sleeping 3 0   Tired, decreased energy 0 0   Change in appetite 0 0   Feeling bad or failure about yourself  0 0   Trouble concentrating 0 0   Moving slowly or fidgety/restless 0 0   Suicidal thoughts 0 0   PHQ-9 Score 3 0   Difficult doing work/chores Not difficult at all Not difficult at all      Social History   Tobacco Use  Smoking Status Never  Smokeless Tobacco Never   BP Readings from Last 3 Encounters:  02/11/22 114/66  02/09/22 130/69  02/02/22 (!) 106/55   Pulse Readings from Last 3 Encounters:  02/11/22 87  02/09/22 81  02/02/22 84   Wt Readings from Last 3 Encounters:  02/11/22 192 lb (87.1 kg)  02/02/22 199 lb 4.8 oz (90.4 kg)  01/22/22 204 lb (92.5 kg)   BMI Readings from Last 3 Encounters:  02/11/22 35.12 kg/m  02/02/22 36.45 kg/m  01/22/22 37.31 kg/m    Assessment/Interventions: Review of patient past medical history, allergies, medications, health status, including review of consultants reports, laboratory and other test data, was performed as part of comprehensive evaluation and provision of chronic care management services.   SDOH:  (Social Determinants of Health) assessments and interventions performed: Yes     CCM Care Plan  Allergies  Allergen Reactions   Amoxicillin-Pot Clavulanate    Levsin  [Hyoscyamine]     Other reaction(s): Dizzyness   Penicillins    Metformin Diarrhea   Metformin And Related Diarrhea    Medications Reviewed Today     Reviewed by Kate Sable, MD (Physician) on 02/11/22 at 1004  Med List Status: <None>   Medication Order Taking? Sig Documenting Provider Last Dose Status Informant  albuterol (VENTOLIN HFA) 108 (90 Base) MCG/ACT inhaler [id number] Yes Inhale 2 puffs into the lungs every 6 (six) hours as needed for wheezing or shortness of breath. Nance Pear, MD Taking Active   amLODipine-benazepril (LOTREL) 5-20 MG capsule [id number] Yes Take 1 capsule by mouth  daily. [name], PA-C Taking Active   atenolol (TENORMIN) 50 MG tablet [id number] Yes Take 1 tablet (50 mg total) by mouth daily. Mecum, Erin E, PA-C Taking Active   Blood Glucose Monitoring Suppl (CONTOUR NEXT USB  MONITOR) w/Device KIT [id number] Yes 1 kit by Does not apply route daily. To check blood sugar once daily. Mar Daring, PA-C Taking Active   CareTouch Safety Lancets 26G MISC [id number] Yes 1 Device by Does not apply route daily. To check blood sugar once daily. Mar Daring, PA-C Taking Active   dapagliflozin propanediol (FARXIGA) 10 MG TABS tablet [id number] Yes Take 1 tablet (10 mg total) by mouth daily. Patient receives through [place] Patient Assistance [name], PA-C Taking Active   glucose blood (CONTOUR NEXT TEST) test strip [id number] Yes To check blood sugar once daily. Mar Daring, PA-C Taking Active   Insulin Glargine Solostar (LANTUS) 100 UNIT/ML Solostar Pen [id number] Yes Inject 50 units into the skin daily. Patient receives through [facility] Patient Assistance [name], PA-C Taking Active Self  Insulin Pen Needle (BD PEN NEEDLE NANO 2ND GEN) 32G X 4 MM MISC [id number] Yes To use with Lantus injections. Mar Daring, PA-C Taking Active   Insulin Pen Needle (NOVOFINE PLUS) 32G X 4 MM MISC [id number] Yes To use with ozempic injections Mar Daring, PA-C Taking Active   omeprazole (PRILOSEC) 20 MG capsule [id number] Yes Take 20 mg by mouth daily. [provider] Taking Active Self  rosuvastatin (CRESTOR) 5 MG tablet [id number] Yes Take 1 tablet (5 mg total) by mouth daily. Mecum, Erin E, PA-C Taking Active   Semaglutide,0.25 or 0.5MG/DOS, (OZEMPIC, 0.25 OR 0.5 MG/DOSE,) 2 MG/1.5ML SOPN [id number] Yes Inject 0.25 mg into the skin once a week. 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/29/2015   Hyperlipidemia associated with type 2 diabetes mellitus ([place]) 12/11/2009   Apnea, sleep 07/30/2008   Colon, diverticulosis 06/07/2008   Hypertension associated with diabetes ([place]) 04/25/2006    Immunization History  Administered Date(s) Administered   Influenza Inj Mdck Quad Pf 07/30/2019   Influenza Inj Mdck Quad With Preservative 08/27/2018   Influenza,inj,Quad PF,6+ Mos 07/28/2015   Pneumococcal Conjugate-13 08/27/2018   Td 08/26/2011   Tdap 08/26/2011    Conditions to be addressed/monitored:  Hypertension, Hyperlipidemia and Diabetes  Care Plan : General Pharmacy (Adult)  Updates made by Germaine Pomfret, RPH since 03/30/2022 12:00 AM     Problem: Hypertension, Hyperlipidemia, Diabetes   Priority: High     Long-Range Goal: Patient-Specific Goal   Start Date: 11/21/2020  Expected End Date: 01/19/2023  This Visit's Progress: On track  Recent Progress: On track  Priority: High  Note:   Current Barriers:  Unable to independently afford treatment regimen Unable to achieve control of Diabetes    Pharmacist Clinical Goal(s):  Over the next 90 days, patient will achieve control of diabetes as evidenced by A1c <8% through collaboration with PharmD and provider.   Interventions: 1:1 collaboration with [name], PA-C regarding development and update of comprehensive plan of care as evidenced by provider attestation and co-signature Inter-disciplinary care team collaboration (see longitudinal plan of care) Comprehensive medication review performed; medication list updated in electronic medical record  Hypertension (BP goal <130/80) -  Uncontrolled -Current  treatment: Amlodipine-benazepril 5-20 mg daily: Appropriate, Query effective Atenolol 50 mg daily: Query appropriate -Medications previously tried: NA   -Current home readings: NA -Denies hypotensive/hypertensive symptoms  -Recommended to continue current medication  Hyperlipidemia: (LDL goal < 70) -uncontrolled -Current treatment: Rosuvastatin 5 mg daily: Appropriate, Query effective -Medications previously tried: NA  -Will defer adjusting rosuvastatin dose due to other changes.  -Continue current medications   Diabetes (A1c goal <8%) Diagnosed 2005  -uncontrolled -Current medications: Farxiga 10 mg daily: Appropriate, Effective, Safe, Accessible Lantus 30 units daily (0.34 u/kg): Appropriate, Query effective Ozempic 0.5 mg weekly: Appropriate, Query effective -Medications previously tried: Metformin (Diarrhea), Jardiance, Ozempic (ineffective)   -Finishing up supply of Lantus prior to starting [place].  -Current home glucose readings: Fasting: 130, 149, 143, 132, 180, 149, 173, 134   -Denies hypoglycemic symptoms -Current meal patterns: Working on intermittent fasting.  -Current exercise: 3000 steps daily, goal 5000.  -Increase Lantus to 32 units daily  -Patient interested in [place] 2, Rx sent in.   Obstructive Sleep Apnea (Goal: Improve sleep quality) -Uncontrolled: Not addressed during this visit. -Current treatment  None  -Medications previously tried: Melatonin (ineffective)  -Patient reports poor sleep quality. She typically gets 3-4 hours of sleep nightly and wakes up 2-3 times nightly.  -STOP-BANG Assessment score of 6-7 (high risk of sleep apnea).  -Patient does not have CPAP Machine  -Recommend referral for sleep study and re-evaluation of sleep apnea symptoms.   Patient Goals/Self-Care Activities Over the next 90 days, patient will:  - check glucose daily, document, and provide at future appointments check blood pressure weekly, document, and provide  at future appointments  Follow Up Plan: Face to Face appointment with care management team member scheduled for: 04/26/2022 at 1:00 PM    Medication Assistance:  [name] obtained through [employer] medication assistance program.  Enrollment ends Dec 2023  Lantus obtained through [facility] medication assistance program.  Enrollment ends Dec 2023  Patient's preferred pharmacy is:  [facility] [address] ([address]), [place]) [place] [address] Phone: [phone] Fax: [phone]  Uses pill box? Yes Pt endorses 90% compliance  We discussed: Current pharmacy is preferred with insurance plan and patient is satisfied with pharmacy services Patient decided to: Continue current medication management strategy  Care Plan and Follow Up Patient Decision:  Patient agrees to Care Plan and Follow-up.  Plan: Face to Face appointment with care management team member scheduled for: 04/26/2022 at 1:00 PM  [name], PharmD, [place], [place] [phone]

## 2022-03-30 MED ORDER — OZEMPIC (0.25 OR 0.5 MG/DOSE) 2 MG/1.5ML ~~LOC~~ SOPN: 0.5000 mg | PEN_INJECTOR | SUBCUTANEOUS | Status: DC

## 2022-03-30 MED ORDER — FREESTYLE LIBRE 2 SENSOR MISC: 1 refills | Status: DC

## 2022-03-30 MED ORDER — INSULIN GLARGINE SOLOSTAR 100 UNIT/ML ~~LOC~~ SOPN: 32.0000 [IU] | PEN_INJECTOR | Freq: Every day | SUBCUTANEOUS | 3 refills | Status: DC

## 2022-03-30 MED ORDER — FREESTYLE LIBRE 2 READER DEVI: 0 refills | Status: DC

## 2022-03-30 NOTE — Patient Instructions (Signed)
Visit Information It was great speaking with you today!  Please let me know if you have any questions about our visit.   Goals Addressed             This Visit's Progress    Monitor and Manage My Blood Sugar-Diabetes Type 2   On track    Timeframe:  Long-Range Goal Priority:  High Start Date: 11/21/2020                            Expected End Date: 07/21/2021                      Follow Up within 30 days   - check blood sugar at prescribed times - check blood sugar if I feel it is too high or too low - enter blood sugar readings and medication or insulin into daily log - take the blood sugar log to all doctor visits    Why is this important?   Checking your blood sugar at home helps to keep it from getting very high or very low.  Writing the results in a diary or log helps the doctor know how to care for you.  Your blood sugar log should have the time, date and the results.  Also, write down the amount of insulin or other medicine that you take.  Other information, like what you ate, exercise done and how you were feeling, will also be helpful.          Patient Care Plan: General Pharmacy (Adult)     Problem Identified: Hypertension, Hyperlipidemia, Diabetes   Priority: High     Long-Range Goal: Patient-Specific Goal   Start Date: 11/21/2020  Expected End Date: 01/19/2023  This Visit's Progress: On track  Recent Progress: On track  Priority: High  Note:   Current Barriers:  Unable to independently afford treatment regimen Unable to achieve control of Diabetes    Pharmacist Clinical Goal(s):  Over the next 90 days, patient will achieve control of diabetes as evidenced by A1c <8% through collaboration with PharmD and provider.   Interventions: 1:1 collaboration with [name], PA-C regarding development and update of comprehensive plan of care as evidenced by provider attestation and co-signature Inter-disciplinary care team collaboration (see longitudinal plan  of care) Comprehensive medication review performed; medication list updated in electronic medical record  Hypertension (BP goal <130/80) -Uncontrolled -Current treatment: Amlodipine-benazepril 5-20 mg daily: Appropriate, Query effective Atenolol 50 mg daily: Query appropriate -Medications previously tried: NA   -Current home readings: NA -Denies hypotensive/hypertensive symptoms  -Recommended to continue current medication  Hyperlipidemia: (LDL goal < 70) -uncontrolled -Current treatment: Rosuvastatin 5 mg daily: Appropriate, Query effective -Medications previously tried: NA  -Will defer adjusting rosuvastatin dose due to other changes.  -Continue current medications   Diabetes (A1c goal <8%) Diagnosed 2005  -uncontrolled -Current medications: Farxiga 10 mg daily: Appropriate, Effective, Safe, Accessible Lantus 30 units daily (0.34 u/kg): Appropriate, Query effective Ozempic 0.5 mg weekly: Appropriate, Query effective -Medications previously tried: Metformin (Diarrhea), Jardiance, Ozempic (ineffective)   -Finishing up supply of Lantus prior to starting [place].  -Current home glucose readings: Fasting: 130, 149, 143, 132, 180, 149, 173, 134   -Denies hypoglycemic symptoms -Current meal patterns: Working on intermittent fasting.  -Current exercise: 3000 steps daily, goal 5000.  -Increase Lantus to 32 units daily  -Patient interested in [place] 2, Rx sent in.   Obstructive Sleep Apnea (Goal:  Improve sleep quality) -Uncontrolled: Not addressed during this visit. -Current treatment  None  -Medications previously tried: Melatonin (ineffective)  -Patient reports poor sleep quality. She typically gets 3-4 hours of sleep nightly and wakes up 2-3 times nightly.  -STOP-BANG Assessment score of 6-7 (high risk of sleep apnea).  -Patient does not have CPAP Machine  -Recommend referral for sleep study and re-evaluation of sleep apnea symptoms.   Patient Goals/Self-Care  Activities Over the next 90 days, patient will:  - check glucose daily, document, and provide at future appointments check blood pressure weekly, document, and provide at future appointments  Follow Up Plan: Face to Face appointment with care management team member scheduled for: 04/26/2022 at 1:00 PM    Patient agreed to services and verbal consent obtained.   Patient verbalizes understanding of instructions and care plan provided today and agrees to view in [place]. Active MyChart status and patient understanding of how to access instructions and care plan via MyChart confirmed with patient.     [name], PharmD, Para March, CPP  Clinical Pharmacist Practitioner  [facility] [phone]

## 2022-04-06 ENCOUNTER — Ambulatory Visit
Admission: RE | Admit: 2022-04-06 | Discharge: 2022-04-06 | Disposition: A | Discharge status: Home / Self Care | Payer: Medicare Other | Source: Ambulatory Visit | Attending: Physician Assistant | Admitting: Physician Assistant

## 2022-04-06 DIAGNOSIS — E119 Type 2 diabetes mellitus without complications: Secondary | ICD-10-CM | POA: Diagnosis not present

## 2022-04-06 DIAGNOSIS — Z1231 Encounter for screening mammogram for malignant neoplasm of breast: Secondary | ICD-10-CM | POA: Insufficient documentation

## 2022-04-06 DIAGNOSIS — Z90710 Acquired absence of both cervix and uterus: Secondary | ICD-10-CM | POA: Insufficient documentation

## 2022-04-06 DIAGNOSIS — N644 Mastodynia: Secondary | ICD-10-CM | POA: Insufficient documentation

## 2022-04-06 DIAGNOSIS — Z13820 Encounter for screening for osteoporosis: Secondary | ICD-10-CM | POA: Insufficient documentation

## 2022-04-06 DIAGNOSIS — R208 Other disturbances of skin sensation: Secondary | ICD-10-CM | POA: Diagnosis not present

## 2022-04-06 DIAGNOSIS — Z780 Asymptomatic menopausal state: Secondary | ICD-10-CM | POA: Insufficient documentation

## 2022-04-14 ENCOUNTER — Other Ambulatory Visit: Payer: Self-pay | Admitting: Physician Assistant

## 2022-04-14 DIAGNOSIS — G4730 Sleep apnea, unspecified: Secondary | ICD-10-CM

## 2022-04-16 DIAGNOSIS — E1159 Type 2 diabetes mellitus with other circulatory complications: Secondary | ICD-10-CM | POA: Diagnosis not present

## 2022-04-16 DIAGNOSIS — I10 Essential (primary) hypertension: Secondary | ICD-10-CM

## 2022-04-16 DIAGNOSIS — Z794 Long term (current) use of insulin: Secondary | ICD-10-CM

## 2022-04-16 DIAGNOSIS — E785 Hyperlipidemia, unspecified: Secondary | ICD-10-CM | POA: Diagnosis not present

## 2022-04-23 ENCOUNTER — Telehealth: Payer: Self-pay

## 2022-04-23 NOTE — Progress Notes (Signed)
    Chronic Care Management Pharmacy Assistant   Name: [name]  MRN: [id number] DOB: 06-10-1956  Patient called to be reminded of her face to face appointment with [name], CPP on 04/26/2022 @ 1300.  Patient aware of appointment date, time, and type of appointment (either telephone or in person). Patient aware to have/bring all medications, supplements, blood pressure and/or blood sugar logs to visit.  Star Rating Drug: Amlodipine-Benazepril 5-20 mg last filled on 04/15/2022 for a 90-Day supply with [place] 10 mg patient receives this medication through [place] patient assistance program. Rosuvastatin 5 mg last filled on 04/11/2022 for a 90-Day supply with [place] 0.5 mg patient receives this medication through [employer] Patient Assistance program  Any gaps in medications fill history? No  Care Gaps: COVID-19 Vaccine Zoster Vaccine Diabetic Eye Exam PAP Smear Tetanus/TDAP PNA Vaccine Diabetic Foot Exam A1C > 9   Lynann Bologna, CPA/CMA Clinical Production designer, theatre/television/film Phone: [phone]

## 2022-04-26 ENCOUNTER — Other Ambulatory Visit: Payer: Self-pay | Admitting: Physician Assistant

## 2022-04-26 ENCOUNTER — Ambulatory Visit (INDEPENDENT_AMBULATORY_CARE_PROVIDER_SITE_OTHER): Payer: Medicare Other

## 2022-04-26 VITALS — Wt 176.1 lb

## 2022-04-26 DIAGNOSIS — E1165 Type 2 diabetes mellitus with hyperglycemia: Secondary | ICD-10-CM

## 2022-04-26 DIAGNOSIS — E1169 Type 2 diabetes mellitus with other specified complication: Secondary | ICD-10-CM

## 2022-04-26 NOTE — Progress Notes (Signed)
Chronic Care Management Pharmacy Note  04/26/2022 Name:  Shari Lee MRN:  [id number] DOB:  05-16-1956   Summary: Patient presents for CCM follow-up. Working on intermittent fasting and has lost significant weight.   -She has been having significant nausea and sickness on Mondays. She gives her ozempic on Fridays. The symptoms have been better the past two weeks.   -STOP-BANG Assessment score of 6-7 (high risk of sleep apnea).  -Patient does not have CPAP Machine    Recommendations/Changes made from today's visit: -Increase Lantus to 32 units daily  -Patient interested in [facility] 2, Rx sent in.   -Recommend referral for sleep study and re-evaluation of sleep apnea symptoms.   Plan: HC follow-up in 2 weeks to evaluate blood sugars  CPP follow-up 1 month   Recommended Problem List Changes:  Add: Class 1 obesity due to excess calories with serious comorbidity and body mass index (BMI) of 32.0 to 32.9 in adult [E66.09, Z68.32]  Subjective: Shari Lee is an 66 y.o. year old female who is a primary patient of Thedore Mins, Ria Comment, [place].  The CCM team was consulted for assistance with disease management and care coordination needs.    Engaged with patient by telephone for follow up visit in response to provider referral for pharmacy case management and/or care coordination services.   Consent to Services:  The patient was given information about Chronic Care Management services, agreed to services, and gave verbal consent prior to initiation of services.  Please see initial visit note for detailed documentation.   Patient Care Team: [name], PA-C as PCP - General (Physician Assistant) Kate Sable, MD as PCP - Cardiology (Cardiology) Germaine Pomfret, [facility] (Pharmacist)  Recent office visits: 01/05/22: Patient presented to [name], PA-C for follow-up. A1c 11%.   Recent consult visits: None in past 6 months.   Hospital visits: None in past 6  months   Objective:  Lab Results  Component Value Date   CREATININE 0.98 01/05/2022   BUN 15 01/05/2022   GFRNONAA >60 10/13/2020   GFRAA 60 10/01/2020   NA 136 01/05/2022   K 4.5 01/05/2022   CALCIUM 9.7 01/05/2022   CO2 23 01/05/2022   GLUCOSE 321 (H) 01/05/2022    Lab Results  Component Value Date/Time   HGBA1C 11.3 (H) 01/05/2022 04:11 PM   HGBA1C 9.2 (A) 10/06/2021 04:17 PM   HGBA1C 9.3 (A) 04/16/2021 04:02 PM   HGBA1C 13.5 (H) 10/01/2020 02:35 PM   MICROALBUR negative 04/16/2021 04:09 PM   MICROALBUR negative 12/15/2018 09:10 AM    Last diabetic Eye exam:  Lab Results  Component Value Date/Time   HMDIABEYEEXA No Retinopathy 07/31/2018 12:00 AM    Last diabetic Foot exam: No results found for: "HMDIABFOOTEX"   Lab Results  Component Value Date   CHOL 165 01/05/2022   HDL 49 01/05/2022   LDLCALC 84 01/05/2022   TRIG 189 (H) 01/05/2022   CHOLHDL 3.4 01/05/2022       Latest Ref Rng & Units 01/05/2022    4:11 PM 10/12/2020    4:27 AM 10/11/2020    4:32 AM  Hepatic Function  Total Protein 6.0 - 8.5 g/dL 7.3  5.3  5.2   Albumin 3.8 - 4.8 g/dL 4.7  2.4  2.4   AST 0 - 40 IU/L 19  38  46   ALT 0 - 32 IU/L 18  44  44   Alk Phosphatase 44 - 121 IU/L 114  43  51   Total  Bilirubin 0.0 - 1.2 mg/dL 0.3  0.7  0.7     Lab Results  Component Value Date/Time   TSH 2.410 10/01/2020 02:35 PM   TSH 2.090 12/15/2018 08:05 AM       Latest Ref Rng & Units 10/13/2020    4:15 AM 10/12/2020    4:27 AM 10/11/2020    4:32 AM  CBC  WBC 4.0 - 10.5 K/uL 11.3  18.2  15.9   Hemoglobin 12.0 - 15.0 g/dL 12.3  12.3  12.0   Hematocrit 36.0 - 46.0 % 36.9  35.4  35.8   Platelets 150 - 400 K/uL 388  417  410     No results found for: "VD25OH"  Clinical ASCVD: No  The 10-year ASCVD risk score (Arnett DK, et al., 2019) is: 10.6%   Values used to calculate the score:     Age: 66 years     Sex: Female     Is Non-Hispanic African American: No     Diabetic: Yes     Tobacco  smoker: No     Systolic Blood Pressure: 740 mmHg     Is BP treated: Yes     HDL Cholesterol: 49 mg/dL     Total Cholesterol: 165 mg/dL       01/05/2022    3:15 PM 10/27/2020    4:34 PM 10/01/2020    1:55 PM  Depression screen PHQ 2/9  Decreased Interest 0 0 0  Down, Depressed, Hopeless 0 0 0  PHQ - 2 Score 0 0 0  Altered sleeping 3 0   Tired, decreased energy 0 0   Change in appetite 0 0   Feeling bad or failure about yourself  0 0   Trouble concentrating 0 0   Moving slowly or fidgety/restless 0 0   Suicidal thoughts 0 0   PHQ-9 Score 3 0   Difficult doing work/chores Not difficult at all Not difficult at all      Social History   Tobacco Use  Smoking Status Never  Smokeless Tobacco Never   BP Readings from Last 3 Encounters:  02/11/22 114/66  02/09/22 130/69  02/02/22 (!) 106/55   Pulse Readings from Last 3 Encounters:  02/11/22 87  02/09/22 81  02/02/22 84   Wt Readings from Last 3 Encounters:  04/26/22 176 lb 1.6 oz (79.9 kg)  02/11/22 192 lb (87.1 kg)  02/02/22 199 lb 4.8 oz (90.4 kg)   BMI Readings from Last 3 Encounters:  04/26/22 32.21 kg/m  02/11/22 35.12 kg/m  02/02/22 36.45 kg/m    Assessment/Interventions: Review of patient past medical history, allergies, medications, health status, including review of consultants reports, laboratory and other test data, was performed as part of comprehensive evaluation and provision of chronic care management services.   SDOH:  (Social Determinants of Health) assessments and interventions performed: Yes     CCM Care Plan  Allergies  Allergen Reactions   Amoxicillin-Pot Clavulanate    Levsin  [Hyoscyamine]     Other reaction(s): Dizzyness   Penicillins    Metformin Diarrhea   Metformin And Related Diarrhea    Medications Reviewed Today     Reviewed by Kate Sable, MD (Physician) on 02/11/22 at 1004  Med List Status: <None>   Medication Order Taking? Sig Documenting Provider Last Dose  Status Informant  albuterol (VENTOLIN HFA) 108 (90 Base) MCG/ACT inhaler [id number] Yes Inhale 2 puffs into the lungs every 6 (six) hours as needed for wheezing or shortness of breath. Archie Balboa,  Carter Kitten, MD Taking Active   amLODipine-benazepril (LOTREL) 5-20 MG capsule [id number] Yes Take 1 capsule by mouth daily. [name], PA-C Taking Active   atenolol (TENORMIN) 50 MG tablet [id number] Yes Take 1 tablet (50 mg total) by mouth daily. Mecum, Erin E, PA-C Taking Active   Blood Glucose Monitoring Suppl (CONTOUR NEXT USB MONITOR) w/Device KIT [id number] Yes 1 kit by Does not apply route daily. To check blood sugar once daily. Mar Daring, PA-C Taking Active   CareTouch Safety Lancets 26G MISC [id number] Yes 1 Device by Does not apply route daily. To check blood sugar once daily. Mar Daring, PA-C Taking Active   dapagliflozin propanediol (FARXIGA) 10 MG TABS tablet [id number] Yes Take 1 tablet (10 mg total) by mouth daily. Patient receives through [place] Patient Assistance [name], PA-C Taking Active   glucose blood (CONTOUR NEXT TEST) test strip [id number] Yes To check blood sugar once daily. Mar Daring, PA-C Taking Active   Insulin Glargine Solostar (LANTUS) 100 UNIT/ML Solostar Pen [id number] Yes Inject 50 units into the skin daily. Patient receives through [facility] Patient Assistance [name], PA-C Taking Active Self  Insulin Pen Needle (BD PEN NEEDLE NANO 2ND GEN) 32G X 4 MM MISC [id number] Yes To use with Lantus injections. Mar Daring, PA-C Taking Active   Insulin Pen Needle (NOVOFINE PLUS) 32G X 4 MM MISC [id number] Yes To use with ozempic injections Mar Daring, PA-C Taking Active   omeprazole (PRILOSEC) 20 MG capsule [id number] Yes Take 20 mg by mouth daily. [provider] Taking Active Self  rosuvastatin (CRESTOR) 5 MG tablet [id number] Yes Take 1 tablet (5 mg total) by mouth daily. Mecum, Erin E, PA-C Taking Active   Semaglutide,0.25  or 0.5MG/DOS, (OZEMPIC, 0.25 OR 0.5 MG/DOSE,) 2 MG/1.5ML SOPN [id number] Yes Inject 0.25 mg into the skin once a week. 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   Hyperlipidemia associated with type 2 diabetes mellitus ([place]) 12/11/2009   Apnea, sleep 07/30/2008   Colon, diverticulosis 06/07/2008   Hypertension associated with diabetes ([place]) 04/25/2006    Immunization History  Administered Date(s) Administered   Influenza Inj Mdck Quad Pf 07/30/2019   Influenza Inj Mdck Quad With Preservative 08/27/2018   Influenza,inj,Quad PF,6+ Mos 07/28/2015   Pneumococcal Conjugate-13 08/27/2018   Td 08/26/2011   Tdap 08/26/2011    Conditions to be addressed/monitored:  Hypertension, Hyperlipidemia and Diabetes  Care Plan : General Pharmacy (Adult)  Updates made by Germaine Pomfret, RPH since 04/26/2022 12:00 AM     Problem: Hypertension, Hyperlipidemia, Diabetes   Priority: High     Long-Range Goal: Patient-Specific Goal   Start Date: 11/21/2020  Expected End Date: 01/19/2023  This Visit's Progress: On track  Recent Progress: On track  Priority: High  Note:   Current Barriers:  Unable to independently afford treatment regimen Unable to achieve control of Diabetes    Pharmacist Clinical Goal(s):  Over the next 90 days, patient will achieve control of diabetes as evidenced by A1c <  8% through collaboration with PharmD and provider.   Interventions: 1:1 collaboration with [name], PA-C regarding development and update of comprehensive plan of care  as evidenced by provider attestation and co-signature Inter-disciplinary care team collaboration (see longitudinal plan of care) Comprehensive medication review performed; medication list updated in electronic medical record  Hypertension (BP goal <130/80) -Uncontrolled -Current treatment: Amlodipine-benazepril 5-20 mg daily: Appropriate, Query effective Atenolol 50 mg daily: Query appropriate -Medications previously tried: NA   -Current home readings: NA -Denies hypotensive/hypertensive symptoms  -Recommended to continue current medication  Hyperlipidemia: (LDL goal < 70) -uncontrolled -Current treatment: Rosuvastatin 5 mg daily: Appropriate, Query effective -Medications previously tried: NA  -Will defer adjusting rosuvastatin dose due to other changes.  -Continue current medications   Diabetes (A1c goal <8%) Diagnosed 2005  -uncontrolled -Current medications: Farxiga 10 mg daily: Appropriate, Effective, Safe, Accessible Lantus 32 units daily (0.40 u/kg): Appropriate, Query effective Ozempic 0.5 mg weekly: Appropriate, Query effective -Medications previously tried: Metformin (Diarrhea), Jardiance,  -She has been having significant nausea and sickness on Mondays. She gives her Ozempic on Fridays. The symptoms have been better the past two weeks.  -Current home glucose readings: -Denies hypoglycemic symptoms -Current meal patterns: Working on intermittent fasting.  -Current exercise: 3000 steps daily, goal 5000.  -Extensive training on the [employer] 2 was given today -Continue current medications -Recheck A1c today.   Obstructive Sleep Apnea (Goal: Improve sleep quality) -Uncontrolled: Not addressed during this visit. -Current treatment  None  -Medications previously tried: Melatonin (ineffective)  -Patient reports poor sleep quality. She typically gets 3-4 hours of sleep nightly and wakes up 2-3 times nightly.  -STOP-BANG Assessment score of 6-7 (high risk of sleep  apnea).  -Patient does not have CPAP Machine  -Recommend referral for sleep study and re-evaluation of sleep apnea symptoms.   Patient Goals/Self-Care Activities Over the next 90 days, patient will:  - check glucose daily, document, and provide at future appointments check blood pressure weekly, document, and provide at future appointments  Follow Up Plan: Telephone follow up appointment with care management team member scheduled for:  05/31/2022 at 3:45 PM     Medication Assistance:  [name] obtained through [place] medication assistance program.  Enrollment ends Dec 2023  Lantus obtained through [facility] medication assistance program.  Enrollment ends Dec 2023  Patient's preferred pharmacy is:  [facility] [address] ([address]), [place] - [place]) [place] [address] Phone: [phone] Fax: [phone]  Uses pill box? Yes Pt endorses 90% compliance  We discussed: Current pharmacy is preferred with insurance plan and patient is satisfied with pharmacy services Patient decided to: Continue current medication management strategy  Care Plan and Follow Up Patient Decision:  Patient agrees to Care Plan and Follow-up.  Plan: Telephone follow up appointment with care management team member scheduled for:  05/31/2022 at 3:45 PM  [name], PharmD, Para March, [place] [phone]

## 2022-04-26 NOTE — Patient Instructions (Addendum)
Visit Information It was great speaking with you today!  Please let me know if you have any questions about our visit.  Plan:  Please set up a follow-up with Ms. Thedore Mins for early August  Be sure to scan your blood sugars at least 5 times daily  If your sensor falls off early, you can request a new one by reaching out to [employer] at [phone] or going to [email or web address]  Print copy of patient instructions, educational materials, and care plan provided in person.  Telephone follow up appointment with pharmacy team member scheduled for: 05/31/2022 at 3:45 PM   [name], PharmD, Para March, [place] [phone]'

## 2022-04-27 ENCOUNTER — Telehealth: Payer: Self-pay

## 2022-04-27 LAB — HEMOGLOBIN A1C
Est. average glucose Bld gHb Est-mCnc: 160 mg/dL
Hgb A1c MFr Bld: 7.2 % — ABNORMAL HIGH (ref 4.8–5.6)

## 2022-04-27 NOTE — Progress Notes (Signed)
    Chronic Care Management Pharmacy Assistant   Name: Shari Lee  MRN: [id number] DOB: 01/17/1956  Reason for Encounter: A1C Results  I received a task from [name], CPP requested that I give the patient a call and inform her of :  "A1c was 7.2% which is a huge improvement from 11.3%. Congratulations for all your hard work at achieving this! If we are able to get your A1c under 7% then that would be even more fantastic. Keep up the hard work with your eating habits and activity level."   Patient stated she saw the results online this morning and she is very happy about her results. Patient stated that she is going to work hard over the next 3 months to get her A1C under 7. Patient has no other concerns or issues today and will follow-up with CPP on 08/14.   Medications: Outpatient Encounter Medications as of 04/27/2022  Medication Sig   albuterol (VENTOLIN HFA) 108 (90 Base) MCG/ACT inhaler Inhale 2 puffs into the lungs every 6 (six) hours as needed for wheezing or shortness of breath.   amLODipine-benazepril (LOTREL) 5-20 MG capsule Take 1 capsule by mouth daily.   atenolol (TENORMIN) 50 MG tablet Take 1 tablet (50 mg total) by mouth daily.   Blood Glucose Monitoring Suppl (CONTOUR NEXT USB MONITOR) w/Device KIT 1 kit by Does not apply route daily. To check blood sugar once daily.   CareTouch Safety Lancets 26G MISC 1 Device by Does not apply route daily. To check blood sugar once daily.   Continuous Blood Gluc Receiver (FREESTYLE LIBRE 2 READER) DEVI Use to monitor blood sugars continuously as prescribed.   Continuous Blood Gluc Sensor (FREESTYLE LIBRE 2 SENSOR) MISC Use to monitor blood sugars continuously as prescribed.   dapagliflozin propanediol (FARXIGA) 10 MG TABS tablet Take 1 tablet (10 mg total) by mouth daily. Patient receives through [place] Patient Assistance   glucose blood (CONTOUR NEXT TEST) test strip To check blood sugar once daily.   Insulin Glargine Solostar  (LANTUS) 100 UNIT/ML Solostar Pen Inject 32 Units into the skin daily. Patient receives through [employer] Patient Assistance through Dec 2023   Insulin Pen Needle (BD PEN NEEDLE NANO 2ND GEN) 32G X 4 MM MISC To use with Lantus injections.   Insulin Pen Needle (NOVOFINE PLUS) 32G X 4 MM MISC To use with ozempic injections   omeprazole (PRILOSEC) 20 MG capsule Take 20 mg by mouth daily.   rosuvastatin (CRESTOR) 5 MG tablet Take 1 tablet (5 mg total) by mouth daily.   Semaglutide,0.25 or 0.5MG /DOS, (OZEMPIC, 0.25 OR 0.5 MG/DOSE,) 2 MG/1.5ML SOPN Inject 0.5 mg into the skin once a week. Patient receives via [employer] Patient Assistance through Dec 2023   No facility-administered encounter medications on file as of 04/27/2022.    Lynann Bologna, CPA/CMA Clinical Pharmacist Assistant Phone: [phone]

## 2022-05-11 ENCOUNTER — Telehealth: Payer: Self-pay

## 2022-05-11 NOTE — Progress Notes (Signed)
    Chronic Care Management Pharmacy Assistant   Name: Shari Lee  MRN: [id number] DOB: 10-Sep-1956  Reason for Encounter: Follow-up regarding Libre Sensor and to provide blood sugar status   I received a task from [name], CPP requesting that I contact the patient regarding the following:  Please check in with patient on Monday afternoon/Tuesday, see if she was able to replace her sensor and see if she has any questions regarding the device. Let her know she is at her blood sugar goal 92% of the time, which is fantastic and she should keep up the good work!  I contacted that patient and she advise she was able to replace her [employer]. Per patient at this time everything is working well and she does not have any questions regarding the sensor. The patient was encouraged to contact me if in the future she has any questions or need any assistance regarding the device. Patient was also informed that per CPP she is at her blood sugar goal 92% of the time. Patient was happy to hear that. Patient has no other concerns or issues at this time, and verbalized understanding to everything we discussed.  Medications: Outpatient Encounter Medications as of 05/11/2022  Medication Sig   albuterol (VENTOLIN HFA) 108 (90 Base) MCG/ACT inhaler Inhale 2 puffs into the lungs every 6 (six) hours as needed for wheezing or shortness of breath.   amLODipine-benazepril (LOTREL) 5-20 MG capsule Take 1 capsule by mouth daily.   atenolol (TENORMIN) 50 MG tablet Take 1 tablet (50 mg total) by mouth daily.   Blood Glucose Monitoring Suppl (CONTOUR NEXT USB MONITOR) w/Device KIT 1 kit by Does not apply route daily. To check blood sugar once daily.   CareTouch Safety Lancets 26G MISC 1 Device by Does not apply route daily. To check blood sugar once daily.   Continuous Blood Gluc Receiver (FREESTYLE LIBRE 2 READER) DEVI Use to monitor blood sugars continuously as prescribed.   Continuous Blood Gluc Sensor (FREESTYLE  LIBRE 2 SENSOR) MISC Use to monitor blood sugars continuously as prescribed.   dapagliflozin propanediol (FARXIGA) 10 MG TABS tablet Take 1 tablet (10 mg total) by mouth daily. Patient receives through [employer] Patient Assistance   glucose blood (CONTOUR NEXT TEST) test strip To check blood sugar once daily.   Insulin Glargine Solostar (LANTUS) 100 UNIT/ML Solostar Pen Inject 32 Units into the skin daily. Patient receives through [employer] Patient Assistance through Dec 2023   Insulin Pen Needle (BD PEN NEEDLE NANO 2ND GEN) 32G X 4 MM MISC To use with Lantus injections.   Insulin Pen Needle (NOVOFINE PLUS) 32G X 4 MM MISC To use with ozempic injections   omeprazole (PRILOSEC) 20 MG capsule Take 20 mg by mouth daily.   rosuvastatin (CRESTOR) 5 MG tablet Take 1 tablet (5 mg total) by mouth daily.   Semaglutide,0.25 or 0.5MG /DOS, (OZEMPIC, 0.25 OR 0.5 MG/DOSE,) 2 MG/1.5ML SOPN Inject 0.5 mg into the skin once a week. Patient receives via [employer] Patient Assistance through Dec 2023   No facility-administered encounter medications on file as of 05/11/2022.    Shari Lee, CPA/CMA Clinical Pharmacist Assistant Phone: [phone]

## 2022-05-17 ENCOUNTER — Ambulatory Visit: Payer: Medicare Other | Admitting: Physician Assistant

## 2022-05-17 DIAGNOSIS — E785 Hyperlipidemia, unspecified: Secondary | ICD-10-CM

## 2022-05-17 DIAGNOSIS — E1165 Type 2 diabetes mellitus with hyperglycemia: Secondary | ICD-10-CM

## 2022-05-17 DIAGNOSIS — E1169 Type 2 diabetes mellitus with other specified complication: Secondary | ICD-10-CM

## 2022-05-17 DIAGNOSIS — Z794 Long term (current) use of insulin: Secondary | ICD-10-CM

## 2022-05-17 NOTE — Progress Notes (Deleted)
Established patient visit   Patient: Shari Lee   DOB: 12-16-1955   66 y.o. Female  MRN: [id number] Visit Date: 05/17/2022  Today's healthcare provider: [name], PA-C   No chief complaint on file.  Subjective    HPI  Diabetes Mellitus Type II, Follow-up  Lab Results  Component Value Date   HGBA1C 7.2 (H) 04/26/2022   HGBA1C 11.3 (H) 01/05/2022   HGBA1C 9.2 (A) 10/06/2021   Wt Readings from Last 3 Encounters:  04/26/22 176 lb 1.6 oz (79.9 kg)  02/11/22 192 lb (87.1 kg)  02/02/22 199 lb 4.8 oz (90.4 kg)   Last seen for diabetes 3 months ago.  Management since then includes keep decreasing the insulin if she is finding she is consistently hypogylcemic D/c glyburide. She reports {excellent/good/fair/poor:19665} compliance with treatment. She {is/is not:21021397} having side effects. {document side effects if present:1} Symptoms: {Yes/No:20286} fatigue {Yes/No:20286} foot ulcerations  {Yes/No:20286} appetite changes {Yes/No:20286} nausea  {Yes/No:20286} paresthesia of the feet  {Yes/No:20286} polydipsia  {Yes/No:20286} polyuria {Yes/No:20286} visual disturbances   {Yes/No:20286} vomiting     Home blood sugar records: {diabetes glucometry results:16657}  Episodes of hypoglycemia? {Yes/No:20286} {enter symptoms and frequency of symptoms if yes:1}   Current insulin regiment: {enter 'none' or type of insulin and number of units taken with each dose of each insulin formulation that the patient is taking:1} Most Recent Eye Exam: *** {Current exercise:16438:::1} {Current diet habits:16563:::1}  Pertinent Labs: Lab Results  Component Value Date   CHOL 165 01/05/2022   HDL 49 01/05/2022   LDLCALC 84 01/05/2022   TRIG 189 (H) 01/05/2022   CHOLHDL 3.4 01/05/2022   Lab Results  Component Value Date   NA 136 01/05/2022   K 4.5 01/05/2022   CREATININE 0.98 01/05/2022   EGFR 64 01/05/2022   MICROALBUR negative 04/16/2021   LABMICR 13.9 01/05/2022      ---------------------------------------------------------------------------------------------------  Follow up for sleep apnea  The patient was last seen for this {NUMBERS 1-12:18279} {days/wks/mos/yrs:310907} ago. Changes made at last visit include ***.  She reports {excellent/good/fair/poor:19665} compliance with treatment. She feels that condition is {improved/worse/unchanged:3041574}. She {is/is not:21021397} having side effects. ***  -----------------------------------------------------------------------------------------  Medications: Outpatient Medications Prior to Visit  Medication Sig   albuterol (VENTOLIN HFA) 108 (90 Base) MCG/ACT inhaler Inhale 2 puffs into the lungs every 6 (six) hours as needed for wheezing or shortness of breath.   amLODipine-benazepril (LOTREL) 5-20 MG capsule Take 1 capsule by mouth daily.   atenolol (TENORMIN) 50 MG tablet Take 1 tablet (50 mg total) by mouth daily.   Blood Glucose Monitoring Suppl (CONTOUR NEXT USB MONITOR) w/Device KIT 1 kit by Does not apply route daily. To check blood sugar once daily.   CareTouch Safety Lancets 26G MISC 1 Device by Does not apply route daily. To check blood sugar once daily.   Continuous Blood Gluc Receiver (FREESTYLE LIBRE 2 READER) DEVI Use to monitor blood sugars continuously as prescribed.   Continuous Blood Gluc Sensor (FREESTYLE LIBRE 2 SENSOR) MISC Use to monitor blood sugars continuously as prescribed.   dapagliflozin propanediol (FARXIGA) 10 MG TABS tablet Take 1 tablet (10 mg total) by mouth daily. Patient receives through [employer] Patient Assistance   glucose blood (CONTOUR NEXT TEST) test strip To check blood sugar once daily.   Insulin Glargine Solostar (LANTUS) 100 UNIT/ML Solostar Pen Inject 32 Units into the skin daily. Patient receives through [employer] Patient Assistance through Dec 2023   Insulin Pen Needle (BD PEN NEEDLE NANO 2ND GEN)  32G X 4 MM MISC To use with Lantus injections.   Insulin Pen  Needle (NOVOFINE PLUS) 32G X 4 MM MISC To use with ozempic injections   omeprazole (PRILOSEC) 20 MG capsule Take 20 mg by mouth daily.   rosuvastatin (CRESTOR) 5 MG tablet Take 1 tablet (5 mg total) by mouth daily.   Semaglutide,0.25 or 0.5MG/DOS, (OZEMPIC, 0.25 OR 0.5 MG/DOSE,) 2 MG/1.5ML SOPN Inject 0.5 mg into the skin once a week. Patient receives via [employer] Patient Assistance through Dec 2023   No facility-administered medications prior to visit.    Review of Systems  {Labs  Heme  Chem  Endocrine  Serology  Results Review (optional):23779}   Objective    There were no vitals taken for this visit. {Show previous vital signs (optional):23777}  Physical Exam  ***  No results found for any visits on 05/17/22.  Assessment & Plan     ***  No follow-ups on file.      {provider attestation***:1}   [name], PA-C  [facility] [phone] (phone) [phone] (fax)  [place]

## 2022-05-27 ENCOUNTER — Ambulatory Visit (INDEPENDENT_AMBULATORY_CARE_PROVIDER_SITE_OTHER): Payer: Medicare Other | Admitting: Physician Assistant

## 2022-05-27 ENCOUNTER — Encounter: Payer: Self-pay | Admitting: Physician Assistant

## 2022-05-27 VITALS — BP 113/71 | HR 85 | Ht 61.0 in | Wt 173.9 lb

## 2022-05-27 DIAGNOSIS — E1159 Type 2 diabetes mellitus with other circulatory complications: Secondary | ICD-10-CM | POA: Diagnosis not present

## 2022-05-27 DIAGNOSIS — E1165 Type 2 diabetes mellitus with hyperglycemia: Secondary | ICD-10-CM

## 2022-05-27 DIAGNOSIS — Z794 Long term (current) use of insulin: Secondary | ICD-10-CM

## 2022-05-27 DIAGNOSIS — Z23 Encounter for immunization: Secondary | ICD-10-CM

## 2022-05-27 DIAGNOSIS — I152 Hypertension secondary to endocrine disorders: Secondary | ICD-10-CM

## 2022-05-27 NOTE — Assessment & Plan Note (Addendum)
Chronic and well controlled on current medications

## 2022-05-27 NOTE — Assessment & Plan Note (Signed)
Now managed with farxiga 10 mg, ozempic 0.5 mg and lantus 32 mg nightly (0.4 u/kg)  Last A1c 7.2% 04/26/22  Pt now wears freestyle libre, reviewing with her, excellent control over last 30 days.  Foot exam today, uacr utd, reminded of optho  on ace-I On statin f/u 10/23

## 2022-05-27 NOTE — Progress Notes (Signed)
I,Sha'taria Tyson,acting as a Education administrator for [employer], PA-C.,have documented all relevant documentation on the behalf of [name], PA-C,as directed by  [name], PA-C while in the presence of [name], PA-C.   Established patient visit   Patient: Shari Lee   DOB: 1955-12-13   66 y.o. Female  MRN: [id number] Visit Date: 05/27/2022  Today's healthcare provider: [name], PA-C   Cc. DMI f/u  Subjective    HPI  Diabetes Mellitus Type II, Follow-up  Lab Results  Component Value Date   HGBA1C 7.2 (H) 04/26/2022   HGBA1C 11.3 (H) 01/05/2022   HGBA1C 9.2 (A) 10/06/2021   Wt Readings from Last 3 Encounters:  05/27/22 173 lb 14.4 oz (78.9 kg)  04/26/22 176 lb 1.6 oz (79.9 kg)  02/11/22 192 lb (87.1 kg)   Last seen for diabetes 4 months ago.  Management since then includes decreasing the insulin if she is finding she is consistently hypogylcemic. Advised she aim for fasting of 110-130. She reports excellent compliance with treatment. She is not having side effects. Symptoms: No fatigue No foot ulcerations  No appetite changes No nausea  No paresthesia of the feet  No polydipsia  No polyuria No visual disturbances   No vomiting     Home blood sugar records: fasting range: 88-120  Episodes of hypoglycemia? Yes lowest was 65 at night.   Current insulin regiment: 32 units at night daily Most Recent Eye Exam: Need to update Current exercise: walking Current diet habits: intermittent fasting, watching carbs and sugar  Pertinent Labs: Lab Results  Component Value Date   CHOL 165 01/05/2022   HDL 49 01/05/2022   LDLCALC 84 01/05/2022   TRIG 189 (H) 01/05/2022   CHOLHDL 3.4 01/05/2022   Lab Results  Component Value Date   NA 136 01/05/2022   K 4.5 01/05/2022   CREATININE 0.98 01/05/2022   EGFR 64 01/05/2022   MICROALBUR negative 04/16/2021   LABMICR 13.9 01/05/2022      ---------------------------------------------------------------------------------------------------   Medications: Outpatient Medications Prior to Visit  Medication Sig   albuterol (VENTOLIN HFA) 108 (90 Base) MCG/ACT inhaler Inhale 2 puffs into the lungs every 6 (six) hours as needed for wheezing or shortness of breath.   amLODipine-benazepril (LOTREL) 5-20 MG capsule Take 1 capsule by mouth daily.   atenolol (TENORMIN) 50 MG tablet Take 1 tablet (50 mg total) by mouth daily.   Blood Glucose Monitoring Suppl (CONTOUR NEXT USB MONITOR) w/Device KIT 1 kit by Does not apply route daily. To check blood sugar once daily.   CareTouch Safety Lancets 26G MISC 1 Device by Does not apply route daily. To check blood sugar once daily.   Continuous Blood Gluc Receiver (FREESTYLE LIBRE 2 READER) DEVI Use to monitor blood sugars continuously as prescribed.   Continuous Blood Gluc Sensor (FREESTYLE LIBRE 2 SENSOR) MISC Use to monitor blood sugars continuously as prescribed.   dapagliflozin propanediol (FARXIGA) 10 MG TABS tablet Take 1 tablet (10 mg total) by mouth daily. Patient receives through [employer] Patient Assistance   glucose blood (CONTOUR NEXT TEST) test strip To check blood sugar once daily.   Insulin Glargine Solostar (LANTUS) 100 UNIT/ML Solostar Pen Inject 32 Units into the skin daily. Patient receives through [employer] Patient Assistance through Dec 2023   Insulin Pen Needle (BD PEN NEEDLE NANO 2ND GEN) 32G X 4 MM MISC To use with Lantus injections.   Insulin Pen Needle (NOVOFINE PLUS) 32G X 4 MM MISC To use with ozempic  injections   omeprazole (PRILOSEC) 20 MG capsule Take 20 mg by mouth daily.   rosuvastatin (CRESTOR) 5 MG tablet Take 1 tablet (5 mg total) by mouth daily.   Semaglutide,0.25 or 0.5MG/DOS, (OZEMPIC, 0.25 OR 0.5 MG/DOSE,) 2 MG/1.5ML SOPN Inject 0.5 mg into the skin once a week. Patient receives via [employer] Patient Assistance through Dec 2023   No facility-administered  medications prior to visit.    Review of Systems  Constitutional:  Negative for fatigue and fever.  Respiratory:  Negative for cough and shortness of breath.   Cardiovascular:  Negative for chest pain and leg swelling.  Gastrointestinal:  Negative for abdominal pain.  Neurological:  Negative for dizziness and headaches.       Objective    BP 113/71 (BP Location: Left Arm, Patient Position: Sitting, Cuff Size: Normal)   Pulse 85   Ht 5' 1"  (1.549 m)   Wt 173 lb 14.4 oz (78.9 kg)   BMI 32.86 kg/m  BP Readings from Last 3 Encounters:  05/27/22 113/71  02/11/22 114/66  02/09/22 130/69   Wt Readings from Last 3 Encounters:  05/27/22 173 lb 14.4 oz (78.9 kg)  04/26/22 176 lb 1.6 oz (79.9 kg)  02/11/22 192 lb (87.1 kg)    Physical Exam Constitutional:      General: She is awake.     Appearance: She is well-developed.  HENT:     Head: Normocephalic.  Eyes:     Conjunctiva/sclera: Conjunctivae normal.  Cardiovascular:     Rate and Rhythm: Normal rate and regular rhythm.     Pulses:          Dorsalis pedis pulses are 3+ on the right side and 3+ on the left side.     Heart sounds: Normal heart sounds.  Pulmonary:     Effort: Pulmonary effort is normal.     Breath sounds: Normal breath sounds.  Feet:     Right foot:     Protective Sensation: 4 sites tested.  4 sites sensed.     Skin integrity: Skin integrity normal.     Toenail Condition: Right toenails are normal.     Left foot:     Protective Sensation: 4 sites tested.  3 sites sensed.     Skin integrity: Skin integrity normal.     Toenail Condition: Left toenails are normal.  Skin:    General: Skin is warm.  Neurological:     Mental Status: She is alert and oriented to person, place, and time.  Psychiatric:        Attention and Perception: Attention normal.        Mood and Affect: Mood normal.        Speech: Speech normal.        Behavior: Behavior is cooperative.     No results found for any visits on  05/27/22.  Assessment & Plan     Problem List Items Addressed This Visit       Cardiovascular and Mediastinum   Hypertension associated with diabetes ([place])    Chronic and well controlled on current medications         Endocrine   Type 2 diabetes mellitus with hyperglycemia ([place]) - Primary    Now managed with farxiga 10 mg, ozempic 0.5 mg and lantus 32 mg nightly (0.4 u/kg)  Last A1c 7.2% 04/26/22  Pt now wears freestyle libre, reviewing with her, excellent control over last 30 days.  Foot exam today, uacr utd, reminded of optho  on ace-I On statin f/u 10/23      Other Visit Diagnoses     Need for vaccination with 20-polyvalent pneumococcal conjugate vaccine       Relevant Orders   Pneumococcal conjugate vaccine 20-valent (Prevnar 20) (Completed)       Return in about 2 months (around 07/27/2022) for DMII.     I, [name], PA-C have reviewed all documentation for this visit. The documentation on  05/27/2022 for the exam, diagnosis, procedures, and orders are all accurate and complete.  [name], PA-C [facility] [id number] [place], [place], [address] Office: [phone] Fax: [place]

## 2022-05-31 ENCOUNTER — Ambulatory Visit (INDEPENDENT_AMBULATORY_CARE_PROVIDER_SITE_OTHER): Payer: Medicare Other

## 2022-05-31 ENCOUNTER — Encounter: Payer: Self-pay | Admitting: Physician Assistant

## 2022-05-31 DIAGNOSIS — G4730 Sleep apnea, unspecified: Secondary | ICD-10-CM

## 2022-05-31 DIAGNOSIS — Z794 Long term (current) use of insulin: Secondary | ICD-10-CM

## 2022-05-31 MED ORDER — INSULIN GLARGINE SOLOSTAR 100 UNIT/ML ~~LOC~~ SOPN: 28.0000 [IU] | PEN_INJECTOR | Freq: Every day | SUBCUTANEOUS | 3 refills | Status: DC

## 2022-05-31 NOTE — Progress Notes (Signed)
Chronic Care Management Pharmacy Note  05/31/2022 Name:  Shari Lee MRN:  [id number] DOB:  02-Sep-1956   Summary: Patient presents for CCM follow-up.   -Patient with excellent blood sugar control but frequent hypoglycemia. reports hypoglycemic symptoms overnight and a few times in the morning/early afternoon.   -Sleep study revealed moderate sleep apnea, CPAP supplies ordered by PCP.  Recommendations/Changes made from today's visit: -DECREASE Lantus to 28 units daily.    Plan: CPP follow-up 3 months   Subjective: Shari Lee is an 66 y.o. year old female who is a primary patient of Thedore Mins, Ria Comment, [place].  The CCM team was consulted for assistance with disease management and care coordination needs.    Engaged with patient by telephone for follow up visit in response to provider referral for pharmacy case management and/or care coordination services.   Consent to Services:  The patient was given information about Chronic Care Management services, agreed to services, and gave verbal consent prior to initiation of services.  Please see initial visit note for detailed documentation.   Patient Care Team: [name], PA-C as PCP - General (Physician Assistant) Kate Sable, MD as PCP - Cardiology (Cardiology) Germaine Pomfret, [facility] (Pharmacist)  Recent office visits: 01/05/22: Patient presented to [name], PA-C for follow-up. A1c 11%.   Recent consult visits: None in past 6 months.   Hospital visits: None in past 6 months   Objective:  Lab Results  Component Value Date   CREATININE 0.98 01/05/2022   BUN 15 01/05/2022   GFRNONAA >60 10/13/2020   GFRAA 60 10/01/2020   NA 136 01/05/2022   K 4.5 01/05/2022   CALCIUM 9.7 01/05/2022   CO2 23 01/05/2022   GLUCOSE 321 (H) 01/05/2022    Lab Results  Component Value Date/Time   HGBA1C 7.2 (H) 04/26/2022 02:04 PM   HGBA1C 11.3 (H) 01/05/2022 04:11 PM   MICROALBUR negative 04/16/2021 04:09 PM    MICROALBUR negative 12/15/2018 09:10 AM    Last diabetic Eye exam:  Lab Results  Component Value Date/Time   HMDIABEYEEXA No Retinopathy 07/31/2018 12:00 AM    Last diabetic Foot exam: No results found for: "HMDIABFOOTEX"   Lab Results  Component Value Date   CHOL 165 01/05/2022   HDL 49 01/05/2022   LDLCALC 84 01/05/2022   TRIG 189 (H) 01/05/2022   CHOLHDL 3.4 01/05/2022       Latest Ref Rng & Units 01/05/2022    4:11 PM 10/12/2020    4:27 AM 10/11/2020    4:32 AM  Hepatic Function  Total Protein 6.0 - 8.5 g/dL 7.3  5.3  5.2   Albumin 3.8 - 4.8 g/dL 4.7  2.4  2.4   AST 0 - 40 IU/L 19  38  46   ALT 0 - 32 IU/L 18  44  44   Alk Phosphatase 44 - 121 IU/L 114  43  51   Total Bilirubin 0.0 - 1.2 mg/dL 0.3  0.7  0.7     Lab Results  Component Value Date/Time   TSH 2.410 10/01/2020 02:35 PM   TSH 2.090 12/15/2018 08:05 AM       Latest Ref Rng & Units 10/13/2020    4:15 AM 10/12/2020    4:27 AM 10/11/2020    4:32 AM  CBC  WBC 4.0 - 10.5 K/uL 11.3  18.2  15.9   Hemoglobin 12.0 - 15.0 g/dL 12.3  12.3  12.0   Hematocrit 36.0 - 46.0 % 36.9  35.4  35.8   Platelets 150 - 400 K/uL 388  417  410     No results found for: "VD25OH"  Clinical ASCVD: No  The 10-year ASCVD risk score (Arnett DK, et al., 2019) is: 10.5%   Values used to calculate the score:     Age: 84 years     Sex: Female     Is Non-Hispanic African American: No     Diabetic: Yes     Tobacco smoker: No     Systolic Blood Pressure: 355 mmHg     Is BP treated: Yes     HDL Cholesterol: 49 mg/dL     Total Cholesterol: 165 mg/dL       01/05/2022    3:15 PM 10/27/2020    4:34 PM 10/01/2020    1:55 PM  Depression screen PHQ 2/9  Decreased Interest 0 0 0  Down, Depressed, Hopeless 0 0 0  PHQ - 2 Score 0 0 0  Altered sleeping 3 0   Tired, decreased energy 0 0   Change in appetite 0 0   Feeling bad or failure about yourself  0 0   Trouble concentrating 0 0   Moving slowly or fidgety/restless 0 0    Suicidal thoughts 0 0   PHQ-9 Score 3 0   Difficult doing work/chores Not difficult at all Not difficult at all      Social History   Tobacco Use  Smoking Status Never  Smokeless Tobacco Never   BP Readings from Last 3 Encounters:  05/27/22 113/71  02/11/22 114/66  02/09/22 130/69   Pulse Readings from Last 3 Encounters:  05/27/22 85  02/11/22 87  02/09/22 81   Wt Readings from Last 3 Encounters:  05/27/22 173 lb 14.4 oz (78.9 kg)  04/26/22 176 lb 1.6 oz (79.9 kg)  02/11/22 192 lb (87.1 kg)   BMI Readings from Last 3 Encounters:  05/27/22 32.86 kg/m  04/26/22 32.21 kg/m  02/11/22 35.12 kg/m    Assessment/Interventions: Review of patient past medical history, allergies, medications, health status, including review of consultants reports, laboratory and other test data, was performed as part of comprehensive evaluation and provision of chronic care management services.   SDOH:  (Social Determinants of Health) assessments and interventions performed: Yes     CCM Care Plan  Allergies  Allergen Reactions   Amoxicillin-Pot Clavulanate    Levsin  [Hyoscyamine]     Other reaction(s): Dizzyness   Penicillins    Metformin Diarrhea   Metformin And Related Diarrhea    Medications Reviewed Today     Reviewed by [name], PA-C (Physician Assistant Certified) on 73/22/02 at 1656  Med List Status: <None>   Medication Order Taking? Sig Documenting Provider Last Dose Status Informant  albuterol (VENTOLIN HFA) 108 (90 Base) MCG/ACT inhaler [id number] Yes Inhale 2 puffs into the lungs every 6 (six) hours as needed for wheezing or shortness of breath. Nance Pear, MD Taking Active   amLODipine-benazepril (LOTREL) 5-20 MG capsule [id number] Yes Take 1 capsule by mouth daily. [name], PA-C Taking Active   atenolol (TENORMIN) 50 MG tablet [id number] Yes Take 1 tablet (50 mg total) by mouth daily. Mecum, Erin E, PA-C Taking Active   Blood Glucose Monitoring  Suppl (CONTOUR NEXT USB MONITOR) w/Device KIT [id number] Yes 1 kit by Does not apply route daily. To check blood sugar once daily. Mar Daring, PA-C Taking Active   CareTouch Safety Lancets 26G MISC [id number] Yes 1 Device by Does not apply route  daily. To check blood sugar once daily. Mar Daring, PA-C Taking Active   Continuous Blood Gluc Receiver (FREESTYLE LIBRE 2 READER) DEVI [id number] Yes Use to monitor blood sugars continuously as prescribed. [name], PA-C Taking Active   Continuous Blood Gluc Sensor (FREESTYLE LIBRE 2 SENSOR) MISC [id number] Yes Use to monitor blood sugars continuously as prescribed. [name], PA-C Taking Active   dapagliflozin propanediol (FARXIGA) 10 MG TABS tablet [id number] Yes Take 1 tablet (10 mg total) by mouth daily. Patient [place] through [place] [name], PA-C Taking Active   glucose blood (CONTOUR NEXT TEST) test strip [id number] Yes To check blood sugar once daily. Mar Daring, PA-C Taking Active   Insulin Glargine Solostar (LANTUS) 100 UNIT/ML Solostar Pen [id number] Yes Inject 32 Units into the skin daily. Patient receives through [facility] Patient Assistance through Dec 2023 Thedore Mins, [place], [place] Taking Active   Insulin Pen Needle (BD PEN NEEDLE NANO 2ND GEN) 32G X 4 MM MISC [id number] Yes To use with Lantus injections. Mar Daring, PA-C Taking Active   Insulin Pen Needle (NOVOFINE PLUS) 32G X 4 MM MISC [id number] Yes To use with ozempic injections Mar Daring, PA-C Taking Active   omeprazole (PRILOSEC) 20 MG capsule [id number] Yes Take 20 mg by mouth daily. [provider] Taking Active Self  rosuvastatin (CRESTOR) 5 MG tablet [id number] Yes Take 1 tablet (5 mg total) by mouth daily. Mecum, Erin E, PA-C Taking Active   Semaglutide,0.25 or 0.5MG/DOS, (OZEMPIC, 0.25 OR 0.5 MG/DOSE,) 2 MG/1.5ML SOPN [id number] Yes Inject 0.5 mg into the skin once a week. 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   Type 2 diabetes mellitus with hyperglycemia ([place]) 04/29/2015   History of digestive disease 04/29/2015   Osteochondritis of tibial tuberosity 04/29/2015   Hyperlipidemia associated with type 2 diabetes mellitus ([place]) 12/11/2009   Apnea, sleep 07/30/2008   Colon, diverticulosis 06/07/2008   Hypertension associated with diabetes ([place]) 04/25/2006    Immunization History  Administered Date(s) Administered   Influenza Inj Mdck Quad Pf 07/30/2019   Influenza Inj Mdck Quad With Preservative 08/27/2018   Influenza,inj,Quad PF,6+ Mos 07/28/2015   PNEUMOCOCCAL CONJUGATE-20 05/27/2022   Pneumococcal Conjugate-13 08/27/2018   Td 08/26/2011   Tdap 08/26/2011    Conditions to be addressed/monitored:  Hypertension, Hyperlipidemia and Diabetes  Care Plan : General Pharmacy (Adult)  Updates made by Germaine Pomfret, [place] since 05/31/2022 12:00 AM     Problem: Hypertension, Hyperlipidemia, Diabetes   Priority: High     Long-Range Goal: Patient-Specific Goal   Start Date: 11/21/2020  Expected End Date: 01/19/2023  This Visit's Progress: On track  Recent Progress: On track  Priority: High  Note:   Current Barriers:  Unable to independently afford treatment regimen Unable to achieve control of Diabetes    Pharmacist Clinical Goal(s):  Over the next 90 days, patient will achieve control of diabetes as evidenced by A1c <8% through collaboration with PharmD and provider.   Interventions: 1:1 collaboration with [name], PA-C regarding development and update of comprehensive plan of care as evidenced by provider attestation and co-signature Inter-disciplinary care team collaboration (see  longitudinal plan of  care) Comprehensive medication review performed; medication list updated in electronic medical record  Hypertension (BP goal <130/80) -Uncontrolled -Current treatment: Amlodipine-benazepril 5-20 mg daily: Appropriate, Query effective Atenolol 50 mg daily: Query appropriate -Medications previously tried: NA   -Current home readings: NA -Denies hypotensive/hypertensive symptoms  -Recommended to continue current medication  Hyperlipidemia: (LDL goal < 70) -uncontrolled -Current treatment: Rosuvastatin 5 mg daily: Appropriate, Query effective -Medications previously tried: NA  -Will defer adjusting rosuvastatin dose due to other changes.  -Continue current medications   Diabetes (A1c goal <7%) -Controlled -Diagnosed 2005  -Current medications: Farxiga 10 mg daily: Appropriate, Effective, Safe, Accessible Lantus 32 units daily (0.40 u/kg): Appropriate, Query effective Ozempic 0.5 mg weekly: Appropriate, Query effective -Medications previously tried: Metformin (Diarrhea), Jardiance,  -She has been having significant nausea and sickness on Mondays. She gives her Ozempic on Fridays. The symptoms have been better the past two weeks.  -Current home glucose readings:  Target 8/1-8/14  Number of days worn ? 14 days 14  % of time active ? 70% 99%  Mean Glucose (mg/dL)  108  GMI (2-week A1c estimate)  5.9%  Glycemic Variability (%CV) ?36% 22.9%  Time above >250 mg/dL <5% 0%  Time above 70-180 mg/dL <25% 1%  Time in range: 70-180 mg/dL >70% 98%  Time below 70 mg/dL <4% 1%  Time below 54 mg/dL <1% 0%    -Reports hypoglycemic symptoms overnight and a few times in the morning/early afternoon.  -Current meal patterns: Working on intermittent fasting.  -Current exercise: 3000 steps daily, goal 5000.  -DECREASE Lantus to 28 units daily.    Obstructive Sleep Apnea (Goal: Improve sleep quality) -Uncontrolled -Current treatment  None  -Medications previously tried:  Melatonin (ineffective)  -Sleep study revealed moderate sleep apnea, CPAP supplies ordered by PCP.  Patient Goals/Self-Care Activities Over the next 90 days, patient will:  - check glucose daily, document, and provide at future appointments check blood pressure weekly, document, and provide at future appointments  Follow Up Plan: Telephone follow up appointment with care management team member scheduled for:  09/13/2022 at 3:45 PM    Medication Assistance:  [name] obtained through [place] medication assistance program.  Enrollment ends Dec 2023  Lantus obtained through [facility] medication assistance program.  Enrollment ends Dec 2023  Patient's preferred pharmacy is:  [facility] [address] [place]) [place] [address] Phone: [phone] Fax: [phone]  Uses pill box? Yes Pt endorses 90% compliance  We discussed: Current pharmacy is preferred with insurance plan and patient is satisfied with pharmacy services Patient decided to: Continue current medication management strategy  Care Plan and Follow Up Patient Decision:  Patient agrees to Care Plan and Follow-up.  Plan: Telephone follow up appointment with care management team member scheduled for:  09/13/2022 at 3:45 PM  [name], PharmD, Para March, [place] [phone]

## 2022-05-31 NOTE — Patient Instructions (Signed)
Visit Information It was great speaking with you today!  Please let me know if you have any questions about our visit.   Goals Addressed             This Visit's Progress    Monitor and Manage My Blood Sugar-Diabetes Type 2   On track    Timeframe:  Long-Range Goal Priority:  High Start Date: 11/21/2020                            Expected End Date: 07/22/2023                      Follow Up within 30 days   - check blood sugar at prescribed times - check blood sugar if I feel it is too high or too low - enter blood sugar readings and medication or insulin into daily log - take the blood sugar log to all doctor visits    Why is this important?   Checking your blood sugar at home helps to keep it from getting very high or very low.  Writing the results in a diary or log helps the doctor know how to care for you.  Your blood sugar log should have the time, date and the results.  Also, write down the amount of insulin or other medicine that you take.  Other information, like what you ate, exercise done and how you were feeling, will also be helpful.          Patient Care Plan: General Pharmacy (Adult)     Problem Identified: Hypertension, Hyperlipidemia, Diabetes   Priority: High     Long-Range Goal: Patient-Specific Goal   Start Date: 11/21/2020  Expected End Date: 01/19/2023  This Visit's Progress: On track  Recent Progress: On track  Priority: High  Note:   Current Barriers:  Unable to independently afford treatment regimen Unable to achieve control of Diabetes    Pharmacist Clinical Goal(s):  Over the next 90 days, patient will achieve control of diabetes as evidenced by A1c <8% through collaboration with PharmD and provider.   Interventions: 1:1 collaboration with [name], PA-C regarding development and update of comprehensive plan of care as evidenced by provider attestation and co-signature Inter-disciplinary care team collaboration (see longitudinal plan  of care) Comprehensive medication review performed; medication list updated in electronic medical record  Hypertension (BP goal <130/80) -Uncontrolled -Current treatment: Amlodipine-benazepril 5-20 mg daily: Appropriate, Query effective Atenolol 50 mg daily: Query appropriate -Medications previously tried: NA   -Current home readings: NA -Denies hypotensive/hypertensive symptoms  -Recommended to continue current medication  Hyperlipidemia: (LDL goal < 70) -uncontrolled -Current treatment: Rosuvastatin 5 mg daily: Appropriate, Query effective -Medications previously tried: NA  -Will defer adjusting rosuvastatin dose due to other changes.  -Continue current medications   Diabetes (A1c goal <7%) -Controlled -Diagnosed 2005  -Current medications: Farxiga 10 mg daily: Appropriate, Effective, Safe, Accessible Lantus 32 units daily (0.40 u/kg): Appropriate, Query effective Ozempic 0.5 mg weekly: Appropriate, Query effective -Medications previously tried: Metformin (Diarrhea), Jardiance,  -She has been having significant nausea and sickness on Mondays. She gives her Ozempic on Fridays. The symptoms have been better the past two weeks.  -Current home glucose readings:  Target 8/1-8/14  Number of days worn ? 14 days 14  % of time active ? 70% 99%  Mean Glucose (mg/dL)  108  GMI (2-week A1c estimate)  5.9%  Glycemic Variability (%CV) ?36% 22.9%  Time above >250 mg/dL <5% 0%  Time above 70-180 mg/dL <25% 1%  Time in range: 70-180 mg/dL >70% 98%  Time below 70 mg/dL <4% 1%  Time below 54 mg/dL <1% 0%    -Reports hypoglycemic symptoms overnight and a few times in the morning/early afternoon.  -Current meal patterns: Working on intermittent fasting.  -Current exercise: 3000 steps daily, goal 5000.  -DECREASE Lantus to 28 units daily.    Obstructive Sleep Apnea (Goal: Improve sleep quality) -Uncontrolled -Current treatment  None  -Medications previously tried: Melatonin  (ineffective)  -Sleep study revealed moderate sleep apnea, CPAP supplies ordered by PCP.  Patient Goals/Self-Care Activities Over the next 90 days, patient will:  - check glucose daily, document, and provide at future appointments check blood pressure weekly, document, and provide at future appointments  Follow Up Plan: Telephone follow up appointment with care management team member scheduled for:  09/13/2022 at 3:45 PM      Patient agreed to services and verbal consent obtained.   Patient verbalizes understanding of instructions and care plan provided today and agrees to view in [place]. Active MyChart status and patient understanding of how to access instructions and care plan via MyChart confirmed with patient.     [name], PharmD, Para [place], CPP  Clinical Pharmacist Practitioner  [facility] [phone]

## 2022-06-17 DIAGNOSIS — I10 Essential (primary) hypertension: Secondary | ICD-10-CM | POA: Diagnosis not present

## 2022-06-17 DIAGNOSIS — Z794 Long term (current) use of insulin: Secondary | ICD-10-CM

## 2022-06-17 DIAGNOSIS — E785 Hyperlipidemia, unspecified: Secondary | ICD-10-CM | POA: Diagnosis not present

## 2022-06-17 DIAGNOSIS — E1159 Type 2 diabetes mellitus with other circulatory complications: Secondary | ICD-10-CM | POA: Diagnosis not present

## 2022-06-18 ENCOUNTER — Telehealth: Payer: Self-pay

## 2022-06-18 DIAGNOSIS — Z794 Long term (current) use of insulin: Secondary | ICD-10-CM

## 2022-06-18 NOTE — Progress Notes (Signed)
Chronic Care Management Pharmacy Assistant   Name: Shari Lee  MRN: [id number] DOB: 19-Jun-1956  Reason for Encounter: Diabetes Disease State Call   Recent office visits:  None ID  Recent consult visits:  None ID  Hospital visits:  None in previous 6 months  Medications: Outpatient Encounter Medications as of 06/18/2022  Medication Sig   albuterol (VENTOLIN HFA) 108 (90 Base) MCG/ACT inhaler Inhale 2 puffs into the lungs every 6 (six) hours as needed for wheezing or shortness of breath.   amLODipine-benazepril (LOTREL) 5-20 MG capsule Take 1 capsule by mouth daily.   atenolol (TENORMIN) 50 MG tablet Take 1 tablet (50 mg total) by mouth daily.   Blood Glucose Monitoring Suppl (CONTOUR NEXT USB MONITOR) w/Device KIT 1 kit by Does not apply route daily. To check blood sugar once daily.   CareTouch Safety Lancets 26G MISC 1 Device by Does not apply route daily. To check blood sugar once daily.   Continuous Blood Gluc Receiver (FREESTYLE LIBRE 2 READER) DEVI Use to monitor blood sugars continuously as prescribed.   Continuous Blood Gluc Sensor (FREESTYLE LIBRE 2 SENSOR) MISC Use to monitor blood sugars continuously as prescribed.   dapagliflozin propanediol (FARXIGA) 10 MG TABS tablet Take 1 tablet (10 mg total) by mouth daily. Patient receives through [place] Patient Assistance   glucose blood (CONTOUR NEXT TEST) test strip To check blood sugar once daily.   Insulin Glargine Solostar (LANTUS) 100 UNIT/ML Solostar Pen Inject 28 Units into the skin daily. Patient receives through [employer] Patient Assistance through Dec 2023   Insulin Pen Needle (BD PEN NEEDLE NANO 2ND GEN) 32G X 4 MM MISC To use with Lantus injections.   Insulin Pen Needle (NOVOFINE PLUS) 32G X 4 MM MISC To use with ozempic injections   omeprazole (PRILOSEC) 20 MG capsule Take 20 mg by mouth daily.   rosuvastatin (CRESTOR) 5 MG tablet Take 1 tablet (5 mg total) by mouth daily.   Semaglutide,0.25 or 0.5MG/DOS, (OZEMPIC,  0.25 OR 0.5 MG/DOSE,) 2 MG/1.5ML SOPN Inject 0.5 mg into the skin once a week. Patient receives via [employer] Patient Assistance through Dec 2023   No facility-administered encounter medications on file as of 06/18/2022.   Care Gaps: COVID-19 Vaccine Zoster Vaccines Diabetic Eye Exam Tetanus/TDAP Influenza Vaccine  Star Rating Drugs: Farxiga 10 mg patient receives this medication from [employer] Patient Assistance Program Rosuvastatin 5 mg last filled on 04/11/2022 for a 90-Day supply with [place] 0.5 mg patient receives this medication from [employer] Patient Assistance Program  Recent Relevant Labs: Lab Results  Component Value Date/Time   HGBA1C 7.2 (H) 04/26/2022 02:04 PM   HGBA1C 11.3 (H) 01/05/2022 04:11 PM   MICROALBUR negative 04/16/2021 04:09 PM   MICROALBUR negative 12/15/2018 09:10 AM    Kidney Function Lab Results  Component Value Date/Time   CREATININE 0.98 01/05/2022 04:11 PM   CREATININE 1.02 (H) 04/16/2021 04:25 PM   GFRNONAA >60 10/13/2020 04:15 AM   GFRAA 60 10/01/2020 02:35 PM   Current antihyperglycemic regimen:  Farxiga 10 mg 1 tablet daily Ozempic inject 0.5 mg weekly  What recent interventions/DTPs have been made to improve glycemic control:  Decrease in Lantus to 28 units daily  Have there been any recent hospitalizations or ED visits since last visit with CPP? No Patient denies hypoglycemic symptoms, including Pale, Sweaty, Shaky, Hungry, Nervous/irritable, and Vision changes Patient denies hyperglycemic symptoms, including blurry vision, excessive thirst, fatigue, polyuria, and weakness How often are you checking your blood  sugar? once daily What are your blood sugars ranging?  Fasting: 96 this morning  During the week, how often does your blood glucose drop below 70?  Per patient she has had a few lows but nothing recently Are you checking your feet daily/regularly? Yes  Adherence Review: Is the patient currently on a STATIN  medication? Yes Is the patient currently on ACE/ARB medication? Yes Does the patient have >5 day gap between last estimated fill dates? No  Patient has a follow-up telephone appointment with [name], CPP on 09/13/2022 @ 1545.  I spoke with the patient and she reports that she is doing well today. Patient denies any ill symptoms at this time. Per patient since she has decreased her Lantus to 28 units she has had a few high blood sugar numbers, but she admits it's due to what she was eating.   Patient is currently taking all medications as directed. The patient reports that she has one pen of her Ozempic left and she is wondering prior to me contacting [employer] regarding a new shipment would CPP be increasing her dose intake from 0.5 mg to 1 mg. I informed the patient that the CPP is out of the office today and will be back in on Tuesday. I advised her that I would send CPP a message with this question and give her a call back Tuesday with further directions. Patient verbalized understanding to all.  Lynann Bologna, CPA/CMA Clinical Pharmacist Assistant Phone: [phone]

## 2022-07-08 ENCOUNTER — Other Ambulatory Visit: Payer: Self-pay | Admitting: Physician Assistant

## 2022-07-08 DIAGNOSIS — I152 Hypertension secondary to endocrine disorders: Secondary | ICD-10-CM

## 2022-07-09 MED ORDER — DAPAGLIFLOZIN PROPANEDIOL 10 MG PO TABS: 10.0000 mg | ORAL_TABLET | Freq: Every day | ORAL | 3 refills | Status: DC

## 2022-07-09 NOTE — Addendum Note (Signed)
Addended by: Daron Offer A on: 07/09/2022 02:42 PM   Modules accepted: Orders

## 2022-07-20 ENCOUNTER — Telehealth: Payer: Self-pay

## 2022-07-20 NOTE — Progress Notes (Signed)
    Chronic Care Management Pharmacy Assistant   Name: Shari Lee  MRN: [id number] DOB: 10/03/1956  Reason for Encounter: Patient Assistance Renewal Application    Patient is currently on patient assistance through [employer] for her Ozempic and CPP would like to start patient on [name] U-100 30 Units daily. Patient is receiving patient assistance through [employer] for her [name].   Renewal application started for the patient's Ozempic and New Application started for her [place]. Both applications will be mailed to the patient's home address to complete and return to the PCP's Office along with her proof of income.  [name] is through [employer] patient assistance program. [employer] should automatically renew for the year 2024. The only thing they will need from [place] is an updated prescription in which CPP will send that over after 08/01/2022.  Patient has any questions she can contact me directly at [phone]. Medications: Outpatient Encounter Medications as of 07/20/2022  Medication Sig   albuterol (VENTOLIN HFA) 108 (90 Base) MCG/ACT inhaler Inhale 2 puffs into the lungs every 6 (six) hours as needed for wheezing or shortness of breath.   amLODipine-benazepril (LOTREL) 5-20 MG capsule Take 1 capsule by mouth daily.   atenolol (TENORMIN) 50 MG tablet Take 1 tablet (50 mg total) by mouth daily.   Blood Glucose Monitoring Suppl (CONTOUR NEXT USB MONITOR) w/Device KIT 1 kit by Does not apply route daily. To check blood sugar once daily.   CareTouch Safety Lancets 26G MISC 1 Device by Does not apply route daily. To check blood sugar once daily.   Continuous Blood Gluc Receiver (FREESTYLE LIBRE 2 READER) DEVI Use to monitor blood sugars continuously as prescribed.   Continuous Blood Gluc Sensor (FREESTYLE LIBRE 2 SENSOR) MISC Use to monitor blood sugars continuously as prescribed.   dapagliflozin propanediol (FARXIGA) 10 MG TABS tablet Take 1 tablet (10 mg total) by mouth daily. Patient receives  through [employer] Patient Assistance   glucose blood (CONTOUR NEXT TEST) test strip To check blood sugar once daily.   Insulin Glargine Solostar (LANTUS) 100 UNIT/ML Solostar Pen Inject 28 Units into the skin daily. Patient receives through [employer] Patient Assistance through Dec 2023   Insulin Pen Needle (BD PEN NEEDLE NANO 2ND GEN) 32G X 4 MM MISC To use with Lantus injections.   Insulin Pen Needle (NOVOFINE PLUS) 32G X 4 MM MISC To use with ozempic injections   omeprazole (PRILOSEC) 20 MG capsule Take 20 mg by mouth daily.   rosuvastatin (CRESTOR) 5 MG tablet Take 1 tablet (5 mg total) by mouth daily.   Semaglutide,0.25 or 0.5MG /DOS, (OZEMPIC, 0.25 OR 0.5 MG/DOSE,) 2 MG/1.5ML SOPN Inject 0.5 mg into the skin once a week. Patient receives via [employer] Patient Assistance through Dec 2023   No facility-administered encounter medications on file as of 07/20/2022.    Lynann Bologna, CPA/CMA Clinical Pharmacist Assistant Phone: [phone]

## 2022-07-21 NOTE — Progress Notes (Unsigned)
Established patient visit   Patient: Shari Lee   DOB: December 08, 1955   66 y.o. Female  MRN: [id number] Visit Date: 07/22/2022  Today's healthcare provider: Gwyneth Sprout, FNP  Introduced to nurse practitioner role and practice setting.  All questions answered.  Discussed provider/patient relationship and expectations.  I,Velisa Regnier J Alois Colgan,acting as a scribe for Gwyneth Sprout, FNP.,have documented all relevant documentation on the behalf of Gwyneth Sprout, FNP,as directed by  Gwyneth Sprout, FNP while in the presence of Gwyneth Sprout, FNP.   Chief Complaint  Patient presents with   URI    Patient complains of sinus drainage, congestion for 5 days.    Cold chills without fever, weakness, loss of appetite, congestion and sinus drainage. Thin drainage. Was exposed to [place].  Has tried Mucinex, Sudafed, Severe Dayquil. Today feeling better.  Subjective    HPI HPI     URI    Additional comments: Patient complains of sinus drainage, congestion for 5 days.       Last edited by Smitty Knudsen, CMA on 07/22/2022  1:45 PM.       Medications: Outpatient Medications Prior to Visit  Medication Sig   amLODipine-benazepril (LOTREL) 5-20 MG capsule Take 1 capsule by mouth daily.   atenolol (TENORMIN) 50 MG tablet Take 1 tablet (50 mg total) by mouth daily.   Blood Glucose Monitoring Suppl (CONTOUR NEXT USB MONITOR) w/Device KIT 1 kit by Does not apply route daily. To check blood sugar once daily.   CareTouch Safety Lancets 26G MISC 1 Device by Does not apply route daily. To check blood sugar once daily.   Continuous Blood Gluc Receiver (FREESTYLE LIBRE 2 READER) DEVI Use to monitor blood sugars continuously as prescribed.   Continuous Blood Gluc Sensor (FREESTYLE LIBRE 2 SENSOR) MISC Use to monitor blood sugars continuously as prescribed.   dapagliflozin propanediol (FARXIGA) 10 MG TABS tablet Take 1 tablet (10 mg total) by mouth daily. Patient receives through [employer] Patient  Assistance   glucose blood (CONTOUR NEXT TEST) test strip To check blood sugar once daily.   Insulin Glargine Solostar (LANTUS) 100 UNIT/ML Solostar Pen Inject 28 Units into the skin daily. Patient receives through [employer] Patient Assistance through Dec 2023   Insulin Pen Needle (BD PEN NEEDLE NANO 2ND GEN) 32G X 4 MM MISC To use with Lantus injections.   Insulin Pen Needle (NOVOFINE PLUS) 32G X 4 MM MISC To use with ozempic injections   omeprazole (PRILOSEC) 20 MG capsule Take 20 mg by mouth daily.   rosuvastatin (CRESTOR) 5 MG tablet Take 1 tablet (5 mg total) by mouth daily.   Semaglutide,0.25 or 0.5MG/DOS, (OZEMPIC, 0.25 OR 0.5 MG/DOSE,) 2 MG/1.5ML SOPN Inject 0.5 mg into the skin once a week. Patient receives via [employer] Patient Assistance through Dec 2023   [DISCONTINUED] albuterol (VENTOLIN HFA) 108 (90 Base) MCG/ACT inhaler Inhale 2 puffs into the lungs every 6 (six) hours as needed for wheezing or shortness of breath.   No facility-administered medications prior to visit.    Review of Systems    Objective    BP 101/69 (BP Location: Left Arm, Patient Position: Sitting, Cuff Size: Large)   Pulse 82   Temp 98.2 F (36.8 C)   Resp 16   Ht 5' 2"  (1.575 m)   Wt 164 lb (74.4 kg)   BMI 30.00 kg/m   Physical Exam Vitals and nursing note reviewed.  Constitutional:      General: She  is not in acute distress.    Appearance: Normal appearance. She is obese. She is not ill-appearing, toxic-appearing or diaphoretic.  HENT:     Head: Normocephalic and atraumatic.     Right Ear: Tympanic membrane, ear canal and external ear normal. There is no impacted cerumen.     Left Ear: Tympanic membrane, ear canal and external ear normal. There is no impacted cerumen.     Ears:     Comments: Use of hearing aids     Nose: Congestion and rhinorrhea present.  Eyes:     Extraocular Movements: Extraocular movements intact.     Pupils: Pupils are equal, round, and reactive to light.      Comments: Complaints of watery, non itchy eyes Reports eye exam scheduled for 10/2022  Cardiovascular:     Rate and Rhythm: Normal rate and regular rhythm.     Pulses: Normal pulses.     Heart sounds: Murmur heard.     No friction rub. No gallop.  Pulmonary:     Effort: Pulmonary effort is normal. No respiratory distress.     Breath sounds: Normal breath sounds. No stridor. No wheezing, rhonchi or rales.  Chest:     Chest wall: No tenderness.  Musculoskeletal:        General: No swelling, tenderness, deformity or signs of injury. Normal range of motion.     Cervical back: Normal range of motion and neck supple. No tenderness.     Right lower leg: No edema.     Left lower leg: No edema.  Skin:    General: Skin is warm and dry.     Capillary Refill: Capillary refill takes less than 2 seconds.     Coloration: Skin is not jaundiced or pale.     Findings: No bruising, erythema, lesion or rash.  Neurological:     General: No focal deficit present.     Mental Status: She is alert and oriented to person, place, and time. Mental status is at baseline.     Cranial Nerves: No cranial nerve deficit.     Sensory: No sensory deficit.     Motor: No weakness.     Coordination: Coordination normal.  Psychiatric:        Mood and Affect: Mood normal.        Behavior: Behavior normal.        Thought Content: Thought content normal.        Judgment: Judgment normal.      No results found for any visits on 07/22/22.  Assessment & Plan     Problem List Items Addressed This Visit       Respiratory   Congestion of nasal sinus    Acute, x 5 days Request for inhaler refill; will also start singulair to assist with production      Relevant Medications   albuterol (VENTOLIN HFA) 108 (90 Base) MCG/ACT inhaler   montelukast (SINGULAIR) 10 MG tablet   fluticasone (FLONASE) 50 MCG/ACT nasal spray   cetirizine (ZYRTEC) 10 MG tablet   Other Relevant Orders   POC COVID-19   POCT Influenza A/B    Seasonal allergic rhinitis - Primary    Acute, x 5 days; reports previous changes with allergens at season changes; however, not on any medications Recommend start of flonase and zyrtec COVID and FLU negative via POC today; reassuring given known exposure to COVID Continue supportive medications as discussed       Return if symptoms worsen or fail to  improve.     Vonna Kotyk, FNP, have reviewed all documentation for this visit. The documentation on 07/22/22 for the exam, diagnosis, procedures, and orders are all accurate and complete.  Gwyneth Sprout, [place] [phone] (phone) [phone] (fax)  [place]

## 2022-07-22 ENCOUNTER — Encounter: Payer: Self-pay | Admitting: Family Medicine

## 2022-07-22 ENCOUNTER — Ambulatory Visit (INDEPENDENT_AMBULATORY_CARE_PROVIDER_SITE_OTHER): Payer: Medicare Other | Admitting: Family Medicine

## 2022-07-22 VITALS — BP 101/69 | HR 82 | Temp 98.2°F | Resp 16 | Ht 62.0 in | Wt 164.0 lb

## 2022-07-22 DIAGNOSIS — R0981 Nasal congestion: Secondary | ICD-10-CM | POA: Insufficient documentation

## 2022-07-22 DIAGNOSIS — J302 Other seasonal allergic rhinitis: Secondary | ICD-10-CM

## 2022-07-22 LAB — POCT INFLUENZA A/B
Influenza A, POC: NEGATIVE
Influenza B, POC: NEGATIVE

## 2022-07-22 LAB — POC COVID19 BINAXNOW: SARS Coronavirus 2 Ag: NEGATIVE

## 2022-07-22 MED ORDER — CETIRIZINE HCL 10 MG PO TABS: 10.0000 mg | ORAL_TABLET | Freq: Every day | ORAL | 11 refills | Status: DC

## 2022-07-22 MED ORDER — ALBUTEROL SULFATE HFA 108 (90 BASE) MCG/ACT IN AERS: 2.0000 | INHALATION_SPRAY | Freq: Four times a day (QID) | RESPIRATORY_TRACT | 2 refills | Status: DC | PRN

## 2022-07-22 MED ORDER — MONTELUKAST SODIUM 10 MG PO TABS: 10.0000 mg | ORAL_TABLET | Freq: Every day | ORAL | 3 refills | Status: DC

## 2022-07-22 MED ORDER — FLUTICASONE PROPIONATE 50 MCG/ACT NA SUSP: 2.0000 | Freq: Every day | NASAL | 6 refills | Status: DC

## 2022-07-22 NOTE — Assessment & Plan Note (Signed)
Acute, x 5 days; reports previous changes with allergens at season changes; however, not on any medications Recommend start of flonase and zyrtec COVID and FLU negative via POC today; reassuring given known exposure to COVID Continue supportive medications as discussed

## 2022-07-22 NOTE — Patient Instructions (Signed)
Continue to drink water daily.  Goal of 64 oz/day minimum.  Can use OTC allergy medication, ex Zyrtec as well as nasal steroid, ex Flonase.** BOTH CALLED IN  If you are congested you can use Saline Nasal Spray.  Use saline prior to using Flonase if needed.  You can use medications for cough like Delsym and for mucus, like Mucinex DM.  Can add lozenges or drink tea with local honey to help relieve symptoms.

## 2022-07-22 NOTE — Assessment & Plan Note (Signed)
Acute, x 5 days Request for inhaler refill; will also start singulair to assist with production

## 2022-08-09 ENCOUNTER — Ambulatory Visit: Payer: Medicare Other | Admitting: Physician Assistant

## 2022-08-17 ENCOUNTER — Ambulatory Visit (INDEPENDENT_AMBULATORY_CARE_PROVIDER_SITE_OTHER): Payer: Medicare Other | Admitting: Physician Assistant

## 2022-08-17 ENCOUNTER — Encounter: Payer: Self-pay | Admitting: Physician Assistant

## 2022-08-17 VITALS — BP 126/85 | HR 81 | Wt 166.8 lb

## 2022-08-17 DIAGNOSIS — Z794 Long term (current) use of insulin: Secondary | ICD-10-CM

## 2022-08-17 DIAGNOSIS — E1165 Type 2 diabetes mellitus with hyperglycemia: Secondary | ICD-10-CM

## 2022-08-17 LAB — POCT GLYCOSYLATED HEMOGLOBIN (HGB A1C): Hemoglobin A1C: 6.6 % — AB (ref 4.0–5.6)

## 2022-08-17 NOTE — Assessment & Plan Note (Addendum)
A1c 6.6% today Advised for now continue current medications, will reach out to [facility] pharmacy to advise on dec lantus/increase ozempic.  Foot exam utd, uacr utd, pt has optho appt. On acei, statin Declined vaccines F/u 4 mo

## 2022-08-17 NOTE — Progress Notes (Signed)
I,Sha'taria Tyson,acting as a Education administrator for [employer], PA-C.,have documented all relevant documentation on the behalf of [name], PA-C,as directed by  [name], PA-C while in the presence of [name], PA-C.   Established patient visit   Patient: Shari Lee   DOB: 01-02-1956   66 y.o. Female  MRN: [id number] Visit Date: 08/17/2022  Today's healthcare provider: [name], PA-C   Cc DM f/u  Subjective    HPI  Diabetes Mellitus Type II, Follow-up  Lab Results  Component Value Date   HGBA1C 6.6 (A) 08/17/2022   HGBA1C 7.2 (H) 04/26/2022   HGBA1C 11.3 (H) 01/05/2022   Wt Readings from Last 3 Encounters:  08/17/22 166 lb 12.8 oz (75.7 kg)  07/22/22 164 lb (74.4 kg)  05/27/22 173 lb 14.4 oz (78.9 kg)   Last seen for diabetes 2 months ago.  Management since then includes managed with farxiga 10 mg, ozempic 0.5 mg and lantus 32 mg nightly (0.4 u/kg) . Now wearing freestyle libre She reports excellent compliance with treatment. She is not having side effects.  Symptoms: No fatigue No foot ulcerations  No appetite changes No nausea  No paresthesia of the feet  No polydipsia  No polyuria No visual disturbances   No vomiting     Home blood sugar records: fasting range: 123  Episodes of hypoglycemia? No    Current insulin regiment: 28 units into skin daily Lantus Most Recent Eye Exam: Jan 24' Current exercise: none Current diet habits: well balanced, intermittent fasting  Pertinent Labs: Lab Results  Component Value Date   CHOL 165 01/05/2022   HDL 49 01/05/2022   LDLCALC 84 01/05/2022   TRIG 189 (H) 01/05/2022   CHOLHDL 3.4 01/05/2022   Lab Results  Component Value Date   NA 136 01/05/2022   K 4.5 01/05/2022   CREATININE 0.98 01/05/2022   EGFR 64 01/05/2022   MICROALBUR negative 04/16/2021   LABMICR 13.9 01/05/2022     ---------------------------------------------------------------------------------------------------    Medications: Outpatient Medications Prior to Visit  Medication Sig   albuterol (VENTOLIN HFA) 108 (90 Base) MCG/ACT inhaler Inhale 2 puffs into the lungs every 6 (six) hours as needed for wheezing or shortness of breath.   amLODipine-benazepril (LOTREL) 5-20 MG capsule Take 1 capsule by mouth daily.   atenolol (TENORMIN) 50 MG tablet Take 1 tablet (50 mg total) by mouth daily.   Blood Glucose Monitoring Suppl (CONTOUR NEXT USB MONITOR) w/Device KIT 1 kit by Does not apply route daily. To check blood sugar once daily.   CareTouch Safety Lancets 26G MISC 1 Device by Does not apply route daily. To check blood sugar once daily.   cetirizine (ZYRTEC) 10 MG tablet Take 1 tablet (10 mg total) by mouth daily.   Continuous Blood Gluc Receiver (FREESTYLE LIBRE 2 READER) DEVI Use to monitor blood sugars continuously as prescribed.   Continuous Blood Gluc Sensor (FREESTYLE LIBRE 2 SENSOR) MISC Use to monitor blood sugars continuously as prescribed.   dapagliflozin propanediol (FARXIGA) 10 MG TABS tablet Take 1 tablet (10 mg total) by mouth daily. Patient receives through [employer] Patient Assistance   fluticasone (FLONASE) 50 MCG/ACT nasal spray Place 2 sprays into both nostrils daily.   glucose blood (CONTOUR NEXT TEST) test strip To check blood sugar once daily.   Insulin Glargine Solostar (LANTUS) 100 UNIT/ML Solostar Pen Inject 28 Units into the skin daily. Patient receives through [employer] Patient Assistance through Dec 2023   Insulin Pen Needle (BD PEN NEEDLE NANO  2ND GEN) 32G X 4 MM MISC To use with Lantus injections.   Insulin Pen Needle (NOVOFINE PLUS) 32G X 4 MM MISC To use with ozempic injections   montelukast (SINGULAIR) 10 MG tablet Take 1 tablet (10 mg total) by mouth at bedtime.   omeprazole (PRILOSEC) 20 MG capsule Take 20 mg by mouth daily.   rosuvastatin (CRESTOR) 5 MG tablet Take 1 tablet (5 mg total) by mouth daily.   Semaglutide,0.25 or 0.5MG/DOS, (OZEMPIC, 0.25 OR 0.5 MG/DOSE,) 2 MG/1.5ML  SOPN Inject 0.5 mg into the skin once a week. Patient receives via [employer] Patient Assistance through Dec 2023   No facility-administered medications prior to visit.    Review of Systems  Constitutional:  Negative for fatigue and fever.  Respiratory:  Negative for cough and shortness of breath.   Cardiovascular:  Negative for chest pain and leg swelling.  Gastrointestinal:  Negative for abdominal pain.  Neurological:  Negative for dizziness and headaches.      Objective    Blood pressure 126/85, pulse 81, weight 166 lb 12.8 oz (75.7 kg), SpO2 100 %.,  Physical Exam Vitals reviewed.  Constitutional:      General: She is awake.     Appearance: She is well-developed. She is not ill-appearing.  HENT:     Head: Normocephalic.  Eyes:     Conjunctiva/sclera: Conjunctivae normal.  Cardiovascular:     Rate and Rhythm: Normal rate and regular rhythm.     Heart sounds: Normal heart sounds.  Pulmonary:     Effort: Pulmonary effort is normal. No respiratory distress.     Breath sounds: Normal breath sounds.  Skin:    General: Skin is warm.  Neurological:     General: No focal deficit present.     Mental Status: She is alert and oriented to person, place, and time.  Psychiatric:        Attention and Perception: Attention normal.        Mood and Affect: Mood normal.        Speech: Speech normal.        Behavior: Behavior normal. Behavior is cooperative.     Results for orders placed or performed in visit on 08/17/22  POCT glycosylated hemoglobin (Hb A1C)  Result Value Ref Range   Hemoglobin A1C 6.6 (A) 4.0 - 5.6 %   HbA1c POC (<> result, manual entry)     HbA1c, POC (prediabetic range)     HbA1c, POC (controlled diabetic range)      Assessment & Plan     Problem List Items Addressed This Visit       Endocrine   Type 2 diabetes mellitus with hyperglycemia ([place]) - Primary    A1c 6.6% today Advised for now continue current medications, will reach out to [facility] pharmacy to  advise on dec lantus/increase ozempic.  Foot exam utd, uacr utd, pt has optho appt. On acei, statin Declined vaccines F/u 4 mo       Relevant Orders   POCT glycosylated hemoglobin (Hb A1C) (Completed)    Return in about 4 months (around 12/16/2022) for AVW.      I, [name], PA-C have reviewed all documentation for this visit. The documentation on  08/17/2022 for the exam, diagnosis, procedures, and orders are all accurate and complete.  [name], PA-C [facility] [address] [place], [place], [address] Office: [phone] Fax: [place]

## 2022-08-20 ENCOUNTER — Telehealth: Payer: Self-pay

## 2022-08-20 NOTE — Telephone Encounter (Signed)
-----   Message from [name], PA-C sent at 08/19/2022  9:59 AM EDT ----- Damaris Schooner to [facility]lex -- he says both papers should be done for 2024, to fill them out as such and he can correct them when she brings it in  He will work on her paperwork for the ozempic, with the goal of getting her a higher dose, sounds like he needs to request the higher dose first.  He will reach out to patient regarding meds, but she can go ahead and fill out the paperwork at home and bring it in

## 2022-09-05 ENCOUNTER — Other Ambulatory Visit: Payer: Self-pay | Admitting: Physician Assistant

## 2022-09-05 DIAGNOSIS — E1165 Type 2 diabetes mellitus with hyperglycemia: Secondary | ICD-10-CM

## 2022-09-13 ENCOUNTER — Ambulatory Visit (INDEPENDENT_AMBULATORY_CARE_PROVIDER_SITE_OTHER): Payer: Medicare Other

## 2022-09-13 DIAGNOSIS — E1169 Type 2 diabetes mellitus with other specified complication: Secondary | ICD-10-CM

## 2022-09-13 DIAGNOSIS — E1165 Type 2 diabetes mellitus with hyperglycemia: Secondary | ICD-10-CM

## 2022-09-13 NOTE — Progress Notes (Signed)
Chronic Care Management Pharmacy Note  09/16/2022 Name:  Shari Lee MRN:  [id number] DOB:  04-11-1956   Summary: Patient presents for CCM follow-up. -Patient blood sugars remain in excellent control, with a few instances of hyperglycemia around Thanksgiving.   Recommendations/Changes made from today's visit: -Continue current medications   Plan: CPP follow-up 3 months   Subjective: Shari Lee is an 66 y.o. year old female who is a primary patient of Thedore Mins, Ria Comment, [place].  The CCM team was consulted for assistance with disease management and care coordination needs.    Engaged with patient by telephone for follow up visit in response to provider referral for pharmacy case management and/or care coordination services.   Consent to Services:  The patient was given information about Chronic Care Management services, agreed to services, and gave verbal consent prior to initiation of services.  Please see initial visit note for detailed documentation.   Patient Care Team: [name], PA-C as PCP - General (Physician Assistant) Kate Sable, MD as PCP - Cardiology (Cardiology) Germaine Pomfret, [facility] (Pharmacist)  Recent office visits: 08/17/22: Patient presented to [name], PA-C for follow-up. A1c 6.6%. 07/22/22: Patient presented to [name], PA-C for follow-up. A1c 7.7%.  04/26/22: Patient presented to [name], PA-C for follow-up. A1c 7.7%.   Recent consult visits: None in past 6 months.   Hospital visits: None in past 6 months   Objective:  Lab Results  Component Value Date   CREATININE 0.98 01/05/2022   BUN 15 01/05/2022   GFRNONAA >60 10/13/2020   GFRAA 60 10/01/2020   NA 136 01/05/2022   K 4.5 01/05/2022   CALCIUM 9.7 01/05/2022   CO2 23 01/05/2022   GLUCOSE 321 (H) 01/05/2022    Lab Results  Component Value Date/Time   HGBA1C 6.6 (A) 08/17/2022 02:43 PM   HGBA1C 7.2 (H) 04/26/2022 02:04 PM   HGBA1C 11.3 (H)  01/05/2022 04:11 PM   MICROALBUR negative 04/16/2021 04:09 PM   MICROALBUR negative 12/15/2018 09:10 AM    Last diabetic Eye exam:  Lab Results  Component Value Date/Time   HMDIABEYEEXA No Retinopathy 07/31/2018 12:00 AM    Last diabetic Foot exam: No results found for: "HMDIABFOOTEX"   Lab Results  Component Value Date   CHOL 165 01/05/2022   HDL 49 01/05/2022   LDLCALC 84 01/05/2022   TRIG 189 (H) 01/05/2022   CHOLHDL 3.4 01/05/2022       Latest Ref Rng & Units 01/05/2022    4:11 PM 10/12/2020    4:27 AM 10/11/2020    4:32 AM  Hepatic Function  Total Protein 6.0 - 8.5 g/dL 7.3  5.3  5.2   Albumin 3.8 - 4.8 g/dL 4.7  2.4  2.4   AST 0 - 40 IU/L 19  38  46   ALT 0 - 32 IU/L 18  44  44   Alk Phosphatase 44 - 121 IU/L 114  43  51   Total Bilirubin 0.0 - 1.2 mg/dL 0.3  0.7  0.7     Lab Results  Component Value Date/Time   TSH 2.410 10/01/2020 02:35 PM   TSH 2.090 12/15/2018 08:05 AM       Latest Ref Rng & Units 10/13/2020    4:15 AM 10/12/2020    4:27 AM 10/11/2020    4:32 AM  CBC  WBC 4.0 - 10.5 K/uL 11.3  18.2  15.9   Hemoglobin 12.0 - 15.0 g/dL 12.3  12.3  12.0   Hematocrit 36.0 -  46.0 % 36.9  35.4  35.8   Platelets 150 - 400 K/uL 388  417  410     No results found for: "VD25OH"  Clinical ASCVD: No  The 10-year ASCVD risk score (Arnett DK, et al., 2019) is: 12.9%   Values used to calculate the score:     Age: 56 years     Sex: Female     Is Non-Hispanic African American: No     Diabetic: Yes     Tobacco smoker: No     Systolic Blood Pressure: 188 mmHg     Is BP treated: Yes     HDL Cholesterol: 49 mg/dL     Total Cholesterol: 165 mg/dL       07/22/2022    1:53 PM 01/05/2022    3:15 PM 10/27/2020    4:34 PM  Depression screen PHQ 2/9  Decreased Interest 0 0 0  Down, Depressed, Hopeless 0 0 0  PHQ - 2 Score 0 0 0  Altered sleeping 0 3 0  Tired, decreased energy 0 0 0  Change in appetite 0 0 0  Feeling bad or failure about yourself  0 0 0   Trouble concentrating 0 0 0  Moving slowly or fidgety/restless 0 0 0  Suicidal thoughts 0 0 0  PHQ-9 Score 0 3 0  Difficult doing work/chores Not difficult at all Not difficult at all Not difficult at all     Social History   Tobacco Use  Smoking Status Never  Smokeless Tobacco Never   BP Readings from Last 3 Encounters:  08/17/22 126/85  07/22/22 101/69  05/27/22 113/71   Pulse Readings from Last 3 Encounters:  08/17/22 81  07/22/22 82  05/27/22 85   Wt Readings from Last 3 Encounters:  08/17/22 166 lb 12.8 oz (75.7 kg)  07/22/22 164 lb (74.4 kg)  05/27/22 173 lb 14.4 oz (78.9 kg)   BMI Readings from Last 3 Encounters:  08/17/22 30.51 kg/m  07/22/22 30.00 kg/m  05/27/22 32.86 kg/m    Assessment/Interventions: Review of patient past medical history, allergies, medications, health status, including review of consultants reports, laboratory and other test data, was performed as part of comprehensive evaluation and provision of chronic care management services.   SDOH:  (Social Determinants of Health) assessments and interventions performed: Yes SDOH Interventions    Flowsheet Row Chronic Care Management from 01/18/2022 in [place] [facility] from 03/23/2021 in [place] [facility] from 02/03/2021 in [place] [facility] from 01/02/2021 in [place] [facility] from 11/21/2020 in [place] Interventions       Transportation Interventions -- -- -- -- Intervention Not Indicated  Financial Strain Interventions Other (Comment)  [PAP] Other (Comment)  [PAP] Other (Comment)  [PAP] Other (Comment)  [PAP in progress] Other (Comment)  [Patient [place]  Allergies  Allergen Reactions   Amoxicillin-Pot Clavulanate    Levsin  [Hyoscyamine]     Other reaction(s): Dizzyness   Penicillins    Metformin  Diarrhea   Metformin And Related Diarrhea    Medications Reviewed Today     Reviewed by [name], PA-C (Physician Assistant Certified) on 41/66/06 at 1540  Med List Status: <None>   Medication Order Taking? Sig Documenting Provider Last Dose Status Informant  albuterol (VENTOLIN HFA) 108 (90 Base) MCG/ACT inhaler [id number]  Inhale 2 puffs into the lungs  every 6 (six) hours as needed for wheezing or shortness of breath. Gwyneth Sprout, FNP  Active   amLODipine-benazepril (LOTREL) 5-20 MG capsule [id number] No Take 1 capsule by mouth daily. [name], PA-C Taking Active   atenolol (TENORMIN) 50 MG tablet [id number] No Take 1 tablet (50 mg total) by mouth daily. Mecum, Erin E, PA-C Taking Active   Blood Glucose Monitoring Suppl (CONTOUR NEXT USB MONITOR) w/Device KIT [id number] No 1 kit by Does not apply route daily. To check blood sugar once daily. Mar Daring, PA-C Taking Active   CareTouch Safety Lancets 26G MISC [id number] No 1 Device by Does not apply route daily. To check blood sugar once daily. Mar Daring, PA-C Taking Active   cetirizine (ZYRTEC) 10 MG tablet [id number]  Take 1 tablet (10 mg total) by mouth daily. Gwyneth Sprout, FNP  Active   Continuous Blood Gluc Receiver (FREESTYLE LIBRE 2 READER) DEVI [id number] No Use to monitor blood sugars continuously as prescribed. [name], PA-C Taking Active   Continuous Blood Gluc Sensor (FREESTYLE LIBRE 2 SENSOR) MISC [id number] No Use to monitor blood sugars continuously as prescribed. [name], PA-C Taking Active   dapagliflozin propanediol (FARXIGA) 10 MG TABS tablet [id number] No Take 1 tablet (10 mg total) by mouth daily. Patient receives through [place] Patient Assistance [name], PA-C Taking Active   fluticasone (FLONASE) 50 MCG/ACT nasal spray [id number]  Place 2 sprays into both nostrils daily. Gwyneth Sprout, FNP  Active   glucose blood (CONTOUR NEXT TEST) test strip [id number] No To check blood  sugar once daily. Mar Daring, PA-C Taking Active   Insulin Glargine Solostar (LANTUS) 100 UNIT/ML Solostar Pen [id number] No Inject 28 Units into the skin daily. Patient receives through [facility] Patient Assistance through Dec 2023 Thedore Mins, [place], [place] Taking Active   Insulin Pen Needle (BD PEN NEEDLE NANO 2ND GEN) 32G X 4 MM MISC [id number] No To use with Lantus injections. Mar Daring, PA-C Taking Active   Insulin Pen Needle (NOVOFINE PLUS) 32G X 4 MM MISC [id number] No To use with ozempic injections Fenton Malling M, PA-C Taking Active   montelukast (SINGULAIR) 10 MG tablet [id number]  Take 1 tablet (10 mg total) by mouth at bedtime. Gwyneth Sprout, FNP  Active   omeprazole (PRILOSEC) 20 MG capsule [id number] No Take 20 mg by mouth daily. [provider] Taking Active Self  rosuvastatin (CRESTOR) 5 MG tablet [id number] No Take 1 tablet (5 mg total) by mouth daily. Mecum, Erin E, PA-C Taking Active   Semaglutide,0.25 or 0.5MG/DOS, (OZEMPIC, 0.25 OR 0.5 MG/DOSE,) 2 MG/1.5ML SOPN [id number] No Inject 0.5 mg into the skin once a week. 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   Type 2 diabetes mellitus with hyperglycemia ([place]) 04/29/2015   History of digestive disease 04/29/2015   Osteochondritis of tibial tuberosity 04/29/2015   Hyperlipidemia associated with type 2 diabetes mellitus ([place]) 12/11/2009   Apnea, sleep 07/30/2008   Colon, diverticulosis 06/07/2008   Hypertension associated with diabetes ([place]) 04/25/2006    Immunization History  Administered Date(s) Administered  Influenza Inj Mdck Quad Pf  07/30/2019   Influenza Inj Mdck Quad With Preservative 08/27/2018   Influenza,inj,Quad PF,6+ Mos 07/28/2015   PNEUMOCOCCAL CONJUGATE-20 05/27/2022   Pneumococcal Conjugate-13 08/27/2018   Td 08/26/2011   Tdap 08/26/2011    Conditions to be addressed/monitored:  Hypertension, Hyperlipidemia and Diabetes  Care Plan : General Pharmacy (Adult)  Updates made by Germaine Pomfret, [place] since 09/16/2022 12:00 AM     Problem: Hypertension, Hyperlipidemia, Diabetes   Priority: High     Long-Range Goal: Patient-Specific Goal   Start Date: 11/21/2020  Expected End Date: 01/19/2023  This Visit's Progress: On track  Recent Progress: On track  Priority: High  Note:   Current Barriers:  Unable to independently afford treatment regimen Unable to achieve control of Diabetes    Pharmacist Clinical Goal(s):  Over the next 90 days, patient will achieve control of diabetes as evidenced by A1c <8% through collaboration with PharmD and provider.   Interventions: 1:1 collaboration with [name], PA-C regarding development and update of comprehensive plan of care as evidenced by provider attestation and co-signature Inter-disciplinary care team collaboration (see longitudinal plan of care) Comprehensive medication review performed; medication list updated in electronic medical record  Hypertension (BP goal <130/80) -Controlled -Current treatment: Amlodipine-benazepril 5-20 mg daily: Appropriate, Query effective Atenolol 50 mg daily: Query appropriate -Medications previously tried: NA   -Current home readings: not checking regularly.  -Denies hypotensive/hypertensive symptoms  -Recommended to continue current medication  Hyperlipidemia: (LDL goal < 70) -uncontrolled -Current treatment: Rosuvastatin 5 mg daily: Appropriate, Query effective -Medications previously tried: NA  -Continue current medications   Diabetes (A1c goal <7%) -Controlled -Diagnosed 2005  -Current  medications: Farxiga 10 mg daily: Appropriate, Effective, Safe, Accessible Lantus 28 units daily (0.37 u/kg): Appropriate, Query effective Ozempic 0.5 mg weekly: Appropriate, Query effective -Medications previously tried: Metformin (Diarrhea), Jardiance,  -Current home glucose readings:  Target 8/1-8/14 11/14-11/27  Number of days worn ? 14 days 14 14  % of time active ? 70% 99% 92%  Mean Glucose (mg/dL)  108 121  GMI (2-week A1c estimate)  5.9% 6.2%  Glycemic Variability (%CV) ?36% 22.9% 19.3%  Time above >250 mg/dL <5% 0% 0%  Time above 70-180 mg/dL <25% 1% 2%  Time in range: 70-180 mg/dL >70% 98% 98%  Time below 70 mg/dL <4% 1% 0%  Time below 54 mg/dL <1% 0% 0%   -Reports hypoglycemic symptoms overnight and a few times in the morning/early afternoon.  -Current meal patterns: Working on intermittent fasting.  -Current exercise: 3000 steps daily, goal 5000.  -Patient blood sugars remain in excellent control, with a few instances of hyperglycemia around Thanksgiving.  -Continue current medications   Obstructive Sleep Apnea (Goal: Improve sleep quality) -Uncontrolled -Current treatment  None  -Medications previously tried: Melatonin (ineffective)  -Sleep study revealed moderate sleep apnea, CPAP supplies ordered by PCP.  Patient Goals/Self-Care Activities Over the next 90 days, patient will:  - check glucose daily, document, and provide at future appointments check blood pressure weekly, document, and provide at future appointments  Follow Up Plan: Telephone follow up appointment with care management team member scheduled for:  02/23/2023 at 3:00 PM     Medication Assistance:  [name] obtained through [employer] medication assistance program.  Enrollment ends Dec 2023  Lantus obtained through [facility] medication assistance program.  Enrollment ends Dec 2023  Patient's preferred pharmacy is:  [facility] (N), [place] - [address] [place] ([place])  [place] [address] Phone: [phone] Fax: [place], [place]. [place] [address] Phone: [phone] Fax: [phone]  Uses pill box? Yes Pt endorses 90% compliance  We discussed: Current pharmacy is preferred with insurance plan and patient is satisfied with pharmacy services Patient decided to: Continue current medication management strategy  Care Plan and Follow Up Patient Decision:  Patient agrees to Care Plan and Follow-up.  Plan: Telephone follow up appointment with care management team member scheduled for:  02/23/2023 at 3:00 PM  [name], PharmD, Para March, [place] Pharmacist Practitioner  [facility] [phone]

## 2022-09-16 DIAGNOSIS — E785 Hyperlipidemia, unspecified: Secondary | ICD-10-CM

## 2022-09-16 DIAGNOSIS — E1169 Type 2 diabetes mellitus with other specified complication: Secondary | ICD-10-CM

## 2022-09-16 DIAGNOSIS — Z794 Long term (current) use of insulin: Secondary | ICD-10-CM

## 2022-09-16 DIAGNOSIS — E1165 Type 2 diabetes mellitus with hyperglycemia: Secondary | ICD-10-CM

## 2022-09-16 NOTE — Patient Instructions (Signed)
Visit Information It was great speaking with you today!  Please let me know if you have any questions about our visit.   Goals Addressed             This Visit's Progress    Monitor and Manage My Blood Sugar-Diabetes Type 2   On track    Timeframe:  Long-Range Goal Priority:  High Start Date: 11/21/2020                            Expected End Date: 07/22/2023                      Follow Up within 30 days   - check blood sugar at prescribed times - check blood sugar if I feel it is too high or too low - enter blood sugar readings and medication or insulin into daily log - take the blood sugar log to all doctor visits    Why is this important?   Checking your blood sugar at home helps to keep it from getting very high or very low.  Writing the results in a diary or log helps the doctor know how to care for you.  Your blood sugar log should have the time, date and the results.  Also, write down the amount of insulin or other medicine that you take.  Other information, like what you ate, exercise done and how you were feeling, will also be helpful.       Track and Manage My Blood Pressure-Hypertension   On track    Timeframe:  Long-Range Goal Priority:  High Start Date: 03/25/2021                             Expected End Date: 09/24/2022                      Follow Up within 30 days   - check blood pressure 3 times per week    Why is this important?   You won't feel high blood pressure, but it can still hurt your blood vessels.  High blood pressure can cause heart or kidney problems. It can also cause a stroke.  Making lifestyle changes like losing a little weight or eating less salt will help.  Checking your blood pressure at home and at different times of the day can help to control blood pressure.  If the doctor prescribes medicine remember to take it the way the doctor ordered.  Call the office if you cannot afford the medicine or if there are questions about it.     Notes:          Patient Care Plan: General Pharmacy (Adult)     Problem Identified: Hypertension, Hyperlipidemia, Diabetes   Priority: High     Long-Range Goal: Patient-Specific Goal   Start Date: 11/21/2020  Expected End Date: 01/19/2023  This Visit's Progress: On track  Recent Progress: On track  Priority: High  Note:   Current Barriers:  Unable to independently afford treatment regimen Unable to achieve control of Diabetes    Pharmacist Clinical Goal(s):  Over the next 90 days, patient will achieve control of diabetes as evidenced by A1c <8% through collaboration with PharmD and provider.   Interventions: 1:1 collaboration with [name], PA-C regarding development and update of comprehensive plan of care as evidenced by provider attestation and  co-signature Inter-disciplinary care team collaboration (see longitudinal plan of care) Comprehensive medication review performed; medication list updated in electronic medical record  Hypertension (BP goal <130/80) -Controlled -Current treatment: Amlodipine-benazepril 5-20 mg daily: Appropriate, Query effective Atenolol 50 mg daily: Query appropriate -Medications previously tried: NA   -Current home readings: not checking regularly.  -Denies hypotensive/hypertensive symptoms  -Recommended to continue current medication  Hyperlipidemia: (LDL goal < 70) -uncontrolled -Current treatment: Rosuvastatin 5 mg daily: Appropriate, Query effective -Medications previously tried: NA  -Continue current medications   Diabetes (A1c goal <7%) -Controlled -Diagnosed 2005  -Current medications: Farxiga 10 mg daily: Appropriate, Effective, Safe, Accessible Lantus 28 units daily (0.37 u/kg): Appropriate, Query effective Ozempic 0.5 mg weekly: Appropriate, Query effective -Medications previously tried: Metformin (Diarrhea), Jardiance,  -Current home glucose readings:  Target 8/1-8/14 11/14-11/27  Number of days worn ? 14 days 14 14  % of  time active ? 70% 99% 92%  Mean Glucose (mg/dL)  108 121  GMI (2-week A1c estimate)  5.9% 6.2%  Glycemic Variability (%CV) ?36% 22.9% 19.3%  Time above >250 mg/dL <5% 0% 0%  Time above 70-180 mg/dL <25% 1% 2%  Time in range: 70-180 mg/dL >70% 98% 98%  Time below 70 mg/dL <4% 1% 0%  Time below 54 mg/dL <1% 0% 0%   -Reports hypoglycemic symptoms overnight and a few times in the morning/early afternoon.  -Current meal patterns: Working on intermittent fasting.  -Current exercise: 3000 steps daily, goal 5000.  -Patient blood sugars remain in excellent control, with a few instances of hyperglycemia around Thanksgiving.  -Continue current medications   Obstructive Sleep Apnea (Goal: Improve sleep quality) -Uncontrolled -Current treatment  None  -Medications previously tried: Melatonin (ineffective)  -Sleep study revealed moderate sleep apnea, CPAP supplies ordered by PCP.  Patient Goals/Self-Care Activities Over the next 90 days, patient will:  - check glucose daily, document, and provide at future appointments check blood pressure weekly, document, and provide at future appointments  Follow Up Plan: Telephone follow up appointment with care management team member scheduled for:  02/23/2023 at 3:00 PM    Patient agreed to services and verbal consent obtained.   Patient verbalizes understanding of instructions and care plan provided today and agrees to view in [place]. Active MyChart status and patient understanding of how to access instructions and care plan via MyChart confirmed with patient.     [name], PharmD, Para [place], CPP  Clinical Pharmacist Practitioner  [facility] [phone]

## 2022-10-19 ENCOUNTER — Telehealth: Payer: Self-pay

## 2022-10-19 NOTE — Progress Notes (Signed)
Name: Shari Lee  MRN: [id number] DOB: 1955-10-23  Reason for Encounter: Hypertension  Contacted patient on 10/19/2022 to discuss hypertension disease state.   Recent office visits:  None ID  Recent consult visits:  None ID  Hospital visits:  None in previous 6 months  Medications: Outpatient Encounter Medications as of 10/19/2022  Medication Sig   albuterol (VENTOLIN HFA) 108 (90 Base) MCG/ACT inhaler Inhale 2 puffs into the lungs every 6 (six) hours as needed for wheezing or shortness of breath.   amLODipine-benazepril (LOTREL) 5-20 MG capsule Take 1 capsule by mouth daily.   atenolol (TENORMIN) 50 MG tablet Take 1 tablet (50 mg total) by mouth daily.   Blood Glucose Monitoring Suppl (CONTOUR NEXT USB MONITOR) w/Device KIT 1 kit by Does not apply route daily. To check blood sugar once daily.   CareTouch Safety Lancets 26G MISC 1 Device by Does not apply route daily. To check blood sugar once daily.   cetirizine (ZYRTEC) 10 MG tablet Take 1 tablet (10 mg total) by mouth daily.   Continuous Blood Gluc Receiver (FREESTYLE LIBRE 2 READER) DEVI Use to monitor blood sugars continuously as prescribed.   Continuous Blood Gluc Sensor (FREESTYLE LIBRE 2 SENSOR) MISC USE TO MONITOR BLOOD SUGAR CONTINUOUSLY AS PRESCRIBED   dapagliflozin propanediol (FARXIGA) 10 MG TABS tablet Take 1 tablet (10 mg total) by mouth daily. Patient receives through [employer] Patient Assistance   fluticasone (FLONASE) 50 MCG/ACT nasal spray Place 2 sprays into both nostrils daily.   glucose blood (CONTOUR NEXT TEST) test strip To check blood sugar once daily.   Insulin Glargine Solostar (LANTUS) 100 UNIT/ML Solostar Pen Inject 28 Units into the skin daily. Patient receives through [employer] Patient Assistance through Dec 2023   Insulin Pen Needle (BD PEN NEEDLE NANO 2ND GEN) 32G X 4 MM MISC To use with Lantus injections.   Insulin Pen Needle (NOVOFINE PLUS) 32G X 4 MM MISC To use with ozempic injections   montelukast  (SINGULAIR) 10 MG tablet Take 1 tablet (10 mg total) by mouth at bedtime.   omeprazole (PRILOSEC) 20 MG capsule Take 20 mg by mouth daily.   rosuvastatin (CRESTOR) 5 MG tablet Take 1 tablet (5 mg total) by mouth daily.   Semaglutide,0.25 or 0.5MG/DOS, (OZEMPIC, 0.25 OR 0.5 MG/DOSE,) 2 MG/1.5ML SOPN Inject 0.5 mg into the skin once a week. Patient receives via [employer] Patient Assistance through Dec 2023   No facility-administered encounter medications on file as of 10/19/2022.   Recent Office Vitals: BP Readings from Last 3 Encounters:  08/17/22 126/85  07/22/22 101/69  05/27/22 113/71   Pulse Readings from Last 3 Encounters:  08/17/22 81  07/22/22 82  05/27/22 85    Wt Readings from Last 3 Encounters:  08/17/22 166 lb 12.8 oz (75.7 kg)  07/22/22 164 lb (74.4 kg)  05/27/22 173 lb 14.4 oz (78.9 kg)    Kidney Function Lab Results  Component Value Date/Time   CREATININE 0.98 01/05/2022 04:11 PM   CREATININE 1.02 (H) 04/16/2021 04:25 PM   GFRNONAA >60 10/13/2020 04:15 AM   GFRAA 60 10/01/2020 02:35 PM      Latest Ref Rng & Units 01/05/2022    4:11 PM 04/16/2021    4:25 PM 10/13/2020    4:15 AM  BMP  Glucose 70 - 99 mg/dL 321  172  86   BUN 8 - 27 mg/dL _0 Creatinine 0.57 - 1.00 mg/dL 0.98  1.02  0.52   BUN/Creat  Ratio 12 - _0 Sodium 134 - 144 mmol/L 136  142  134   Potassium 3.5 - 5.2 mmol/L 4.5  4.7  3.9   Chloride 96 - 106 mmol/L 98  101  99   CO2 20 - 29 mmol/L _1 Calcium 8.7 - 10.3 mg/dL 9.7  9.8  7.9    Current antihypertensive regimen:  Amlodipine-benazepril 5-20 mg daily Atenolol 50 mg daily:   Patient verbally confirms she is taking the above medications as directed. Yes  How often are you checking your Blood Pressure? infrequently  Patient checks her blood pressure in the morning after taking her medication.  Current home BP readings: Patient hasn't recently taken her Blood Pressure readings  Any readings above 180/120?  No If yes any symptoms of hypertensive emergency? patient denies any symptoms of high blood pressure  What recent interventions/DTPs have been made by any provider to improve Blood Pressure control since last CPP Visit: None ID  Any recent hospitalizations or ED visits since last visit with CPP? No  Adherence Review: Is the patient currently on ACE/ARB medication? No Does the patient have >5 day gap between last estimated fill dates? No  Star Rating Drugs:  Farxiga 10 mg Patient receives this medication via [employer] Patient [employer] Program Rosuvastatin 5 mg last filled on 07/09/2022 for a 90-Day supply with [place] 0.5 mg patient receives this medication via [employer] Patient Assistance Program  Patient reports that she is doing well. Patient denies any ill symptoms at this time. Patient reports that she has not been eating her best over the holidays so her blood sugar numbers have been a little elevated. Patient does state taking her medication does bring them normal, but she is aware that the way she has been eating the last few weeks isn't the best so she plans to start back with controlling her diet this week.  Patient reports that her blood pressure is never an issue as long as she is taking her medications as prescribed. Patient stated that she hardly checks her blood pressure at home, but she confirms that she takes her medication as directed.   Patient hasn't heard anything from [employer] regarding her 2024 Renewal for her Ozempic. Patient reports that she only has 2 boxes left. I informed her I will check status and let her know if anything is needed. Patient verbalized understanding.  I spoke with [employer] they did confirm receipt of the patient's application. They are waiting on the verification process of her insurance to complete. Nothing further is needed  Patient has a follow-up appointment with [name], CPP on 03/15/2023 @ 1500.  Lynann Bologna, CPA/CMA Clinical Pharmacist Assistant Phone: [phone] '    Lynann Bologna

## 2022-10-28 DIAGNOSIS — E119 Type 2 diabetes mellitus without complications: Secondary | ICD-10-CM | POA: Diagnosis not present

## 2022-10-28 DIAGNOSIS — H2512 Age-related nuclear cataract, left eye: Secondary | ICD-10-CM | POA: Diagnosis not present

## 2022-10-28 DIAGNOSIS — H2511 Age-related nuclear cataract, right eye: Secondary | ICD-10-CM | POA: Diagnosis not present

## 2022-10-28 LAB — HM DIABETES EYE EXAM

## 2022-11-07 ENCOUNTER — Other Ambulatory Visit: Payer: Self-pay | Admitting: Physician Assistant

## 2022-11-07 DIAGNOSIS — I10 Essential (primary) hypertension: Secondary | ICD-10-CM

## 2022-11-07 DIAGNOSIS — E1169 Type 2 diabetes mellitus with other specified complication: Secondary | ICD-10-CM

## 2022-11-09 NOTE — Telephone Encounter (Signed)
Requested Prescriptions  Pending Prescriptions Disp Refills   atenolol (TENORMIN) 50 MG tablet [Pharmacy Med Name: Atenolol 50 MG Oral Tablet] 90 tablet 1    Sig: Take 1 tablet by mouth once daily     Cardiovascular: Beta Blockers 2 Passed - 11/07/2022 10:34 PM      Passed - Cr in normal range and within 360 days    Creatinine, Ser  Date Value Ref Range Status  01/05/2022 0.98 0.57 - 1.00 mg/dL Final         Passed - Last BP in normal range    BP Readings from Last 1 Encounters:  08/17/22 126/85         Passed - Last Heart Rate in normal range    Pulse Readings from Last 1 Encounters:  08/17/22 81         Passed - Valid encounter within last 6 months    Recent Outpatient Visits           2 months ago Type 2 diabetes mellitus with hyperglycemia, with long-term current use of insulin ([place])   [place] Thedore Mins, [place], PA-C   3 months ago Seasonal allergic rhinitis, unspecified trigger   [place] Tally Joe T, FNP   5 months ago Type 2 diabetes mellitus with hyperglycemia, with long-term current use of insulin [facility])   [place] [name], PA-C   9 months ago Pain of left breast   [place] [name], PA-C   10 months ago Type 2 diabetes mellitus with hyperglycemia, with long-term current use of insulin ([place])   [place] Thedore Mins, [place], PA-C               rosuvastatin (CRESTOR) 5 MG tablet [Pharmacy Med Name: Rosuvastatin Calcium 5 MG Oral Tablet] 90 tablet 3    Sig: Take 1 tablet by mouth once daily     Cardiovascular:  Antilipid - Statins 2 Failed - 11/07/2022 10:34 PM      Failed - Lipid Panel in normal range within the last 12 months    Cholesterol, Total  Date Value Ref Range Status  01/05/2022 165 100 - 199 mg/dL Final   LDL Chol Calc (NIH)  Date Value Ref Range Status  01/05/2022 84 0 - 99 mg/dL Final    HDL  Date Value Ref Range Status  01/05/2022 49 >39 mg/dL Final   Triglycerides  Date Value Ref Range Status  01/05/2022 189 (H) 0 - 149 mg/dL Final         Passed - Cr in normal range and within 360 days    Creatinine, Ser  Date Value Ref Range Status  01/05/2022 0.98 0.57 - 1.00 mg/dL Final         Passed - Patient is not pregnant      Passed - Valid encounter within last 12 months    Recent Outpatient Visits           2 months ago Type 2 diabetes mellitus with hyperglycemia, with long-term current use of insulin [facility])   [place] [name], PA-C   3 months ago Seasonal allergic rhinitis, unspecified trigger   [place] Tally Joe T, FNP   5 months ago Type 2 diabetes mellitus with hyperglycemia, with long-term current use of insulin [facility])   [place] Thedore Mins, [place], PA-C   9 months ago Pain  of left breast   [place] [name], PA-C   10 months ago Type 2 diabetes mellitus with hyperglycemia, with long-term current use of insulin [facility])   [place] [name], [place]

## 2022-11-15 DIAGNOSIS — H2511 Age-related nuclear cataract, right eye: Secondary | ICD-10-CM | POA: Diagnosis not present

## 2022-11-15 DIAGNOSIS — H2513 Age-related nuclear cataract, bilateral: Secondary | ICD-10-CM | POA: Diagnosis not present

## 2022-11-15 DIAGNOSIS — H2512 Age-related nuclear cataract, left eye: Secondary | ICD-10-CM | POA: Diagnosis not present

## 2022-11-16 ENCOUNTER — Telehealth: Payer: Self-pay

## 2022-11-17 DIAGNOSIS — G4733 Obstructive sleep apnea (adult) (pediatric): Secondary | ICD-10-CM | POA: Diagnosis not present

## 2022-11-19 ENCOUNTER — Encounter: Payer: Self-pay | Admitting: Physician Assistant

## 2022-11-19 ENCOUNTER — Ambulatory Visit (INDEPENDENT_AMBULATORY_CARE_PROVIDER_SITE_OTHER): Payer: Medicare Other | Admitting: Physician Assistant

## 2022-11-19 VITALS — BP 111/66 | HR 86 | Ht 61.0 in | Wt 168.0 lb

## 2022-11-19 DIAGNOSIS — G4709 Other insomnia: Secondary | ICD-10-CM

## 2022-11-19 DIAGNOSIS — R110 Nausea: Secondary | ICD-10-CM | POA: Diagnosis not present

## 2022-11-19 DIAGNOSIS — G4733 Obstructive sleep apnea (adult) (pediatric): Secondary | ICD-10-CM

## 2022-11-19 DIAGNOSIS — R1110 Vomiting, unspecified: Secondary | ICD-10-CM

## 2022-11-19 DIAGNOSIS — R197 Diarrhea, unspecified: Secondary | ICD-10-CM | POA: Diagnosis not present

## 2022-11-19 DIAGNOSIS — E1165 Type 2 diabetes mellitus with hyperglycemia: Secondary | ICD-10-CM | POA: Diagnosis not present

## 2022-11-19 MED ORDER — ONDANSETRON 4 MG PO TBDP: 4.0000 mg | ORAL_TABLET | Freq: Three times a day (TID) | ORAL | 0 refills | Status: DC | PRN

## 2022-11-19 MED ORDER — TRAZODONE HCL 50 MG PO TABS: 25.0000 mg | ORAL_TABLET | Freq: Every evening | ORAL | 3 refills | Status: DC | PRN

## 2022-11-19 NOTE — Progress Notes (Signed)
I,Sha'taria Tyson,acting as a Education administrator for [employer], PA-C.,have documented all relevant documentation on the behalf of Shari Kirschner, PA-C,as directed by  Shari Kirschner, PA-C while in the presence of Shari Kirschner, PA-C.   Established patient visit   Patient: Shari Lee   DOB: 12/06/1955   67 y.o. Female  MRN: [id number] Visit Date: 11/19/2022  Today's healthcare provider: [name], PA-C   Cc. Vomiting, nausea, diarrhea  Subjective     Pt reports for the last 12 days having intermittent episodes of vomiting and diarrhea. Reports happens at different times throughout the day, nausea followed by vomiting -- appears as stomach contents denies blood in vomit. Reports will usually have an episode of loose stools after vomiting. Denies flood in stool. This can happen 5-10 times a day or not at all. Reports her BS has been running lower than normal but denies any lows aside from one in the 50s overnight. She has been sometimes skipping or lowering her insulin dose.  Reports intermittent heartburn after vomiting. Today pt feels well.  Denies fever, chills, URI symptoms, urinary symptoms.   Follow up for sleep apnea  The patient was last seen for this 6 months ago. Changes made at last visit include CPAP ordered.  She reports excellent compliance with treatment. Uses nightly for around 4 hours a night.  Reports difficulty staying asleep--will sleep the initial 3-5 hours and then be awake. Sometimes able to sleep for another hour.  She feels that condition is  improving . She is not having side effects.  -----------------------------------------------------------------------------------------   Medications: Outpatient Medications Prior to Visit  Medication Sig   albuterol (VENTOLIN HFA) 108 (90 Base) MCG/ACT inhaler Inhale 2 puffs into the lungs every 6 (six) hours as needed for wheezing or shortness of breath.   amLODipine-benazepril (LOTREL) 5-20 MG capsule Take 1  capsule by mouth daily.   atenolol (TENORMIN) 50 MG tablet Take 1 tablet by mouth once daily   Blood Glucose Monitoring Suppl (CONTOUR NEXT USB MONITOR) w/Device KIT 1 kit by Does not apply route daily. To check blood sugar once daily.   CareTouch Safety Lancets 26G MISC 1 Device by Does not apply route daily. To check blood sugar once daily.   cetirizine (ZYRTEC) 10 MG tablet Take 1 tablet (10 mg total) by mouth daily.   Continuous Blood Gluc Receiver (FREESTYLE LIBRE 2 READER) DEVI Use to monitor blood sugars continuously as prescribed.   Continuous Blood Gluc Sensor (FREESTYLE LIBRE 2 SENSOR) MISC USE TO MONITOR BLOOD SUGAR CONTINUOUSLY AS PRESCRIBED   dapagliflozin propanediol (FARXIGA) 10 MG TABS tablet Take 1 tablet (10 mg total) by mouth daily. Patient receives through [employer] Patient Assistance   fluticasone (FLONASE) 50 MCG/ACT nasal spray Place 2 sprays into both nostrils daily.   glucose blood (CONTOUR NEXT TEST) test strip To check blood sugar once daily.   montelukast (SINGULAIR) 10 MG tablet Take 1 tablet (10 mg total) by mouth at bedtime.   omeprazole (PRILOSEC) 20 MG capsule Take 20 mg by mouth daily.   rosuvastatin (CRESTOR) 5 MG tablet Take 1 tablet by mouth once daily   Semaglutide,0.25 or 0.'5MG'$ /DOS, (OZEMPIC, 0.25 OR 0.5 MG/DOSE,) 2 MG/1.5ML SOPN Inject 0.5 mg into the skin once a week. Patient receives via [employer] Patient Assistance through Dec 2023   Insulin Glargine Solostar (LANTUS) 100 UNIT/ML Solostar Pen Inject 28 Units into the skin daily. Patient receives through [employer] Patient Assistance through Dec 2023   Insulin Pen Needle (BD PEN  NEEDLE NANO 2ND GEN) 32G X 4 MM MISC To use with Lantus injections.   Insulin Pen Needle (NOVOFINE PLUS) 32G X 4 MM MISC To use with ozempic injections   No facility-administered medications prior to visit.    Review of Systems  Gastrointestinal:  Positive for diarrhea and vomiting.     Objective    Blood pressure 111/66, pulse  86, height '5\' 1"'$  (1.549 m), weight 168 lb (76.2 kg), SpO2 100 %.   Physical Exam Constitutional:      General: She is awake.     Appearance: She is well-developed.  HENT:     Head: Normocephalic.  Eyes:     Conjunctiva/sclera: Conjunctivae normal.  Cardiovascular:     Rate and Rhythm: Normal rate and regular rhythm.     Heart sounds: Normal heart sounds.  Pulmonary:     Effort: Pulmonary effort is normal.     Breath sounds: Normal breath sounds.  Abdominal:     General: Abdomen is flat.     Palpations: Abdomen is soft.     Tenderness: There is no abdominal tenderness. There is no guarding or rebound.  Skin:    General: Skin is warm.  Neurological:     Mental Status: She is alert and oriented to person, place, and time.  Psychiatric:        Attention and Perception: Attention normal.        Mood and Affect: Mood normal.        Speech: Speech normal.        Behavior: Behavior is cooperative.      No results found for any visits on 11/19/22.  Assessment & Plan     Nausea, vomiting, diarrhea Increase fluids Will check cbc, cmp, lipase. Will check for hpylori. Pt does take PPI in AM. Advised pt to closely monitor BS Rx zofran ODT for nausea  2. OSA Appropriate CPAP compliance pt feeling benefit from use  3. Insomnia Discussed sleep hygiene briefly Trial of trazodone 25 mg qhs prn   Return in about 6 weeks (around 12/31/2022) for insomnia.      I, Shari Kirschner, PA-C have reviewed all documentation for this visit. The documentation on  11/19/22 for the exam, diagnosis, procedures, and orders are all accurate and complete.  Shari Kirschner, PA-C [facility] [address] [place], [place], [address] Office: [phone] Fax: [place]

## 2022-11-20 LAB — COMPREHENSIVE METABOLIC PANEL
ALT: 11 IU/L (ref 0–32)
AST: 16 IU/L (ref 0–40)
Albumin/Globulin Ratio: 2 (ref 1.2–2.2)
Albumin: 4.3 g/dL (ref 3.9–4.9)
Alkaline Phosphatase: 93 IU/L (ref 44–121)
BUN/Creatinine Ratio: 25 (ref 12–28)
BUN: 28 mg/dL — ABNORMAL HIGH (ref 8–27)
Bilirubin Total: 0.4 mg/dL (ref 0.0–1.2)
CO2: 22 mmol/L (ref 20–29)
Calcium: 9.4 mg/dL (ref 8.7–10.3)
Chloride: 101 mmol/L (ref 96–106)
Creatinine, Ser: 1.14 mg/dL — ABNORMAL HIGH (ref 0.57–1.00)
Globulin, Total: 2.2 g/dL (ref 1.5–4.5)
Glucose: 198 mg/dL — ABNORMAL HIGH (ref 70–99)
Potassium: 3.8 mmol/L (ref 3.5–5.2)
Sodium: 140 mmol/L (ref 134–144)
Total Protein: 6.5 g/dL (ref 6.0–8.5)
eGFR: 53 mL/min/{1.73_m2} — ABNORMAL LOW (ref >59)

## 2022-11-20 LAB — CBC WITH DIFFERENTIAL/PLATELET
Basophils Absolute: 0.1 10*3/uL (ref 0.0–0.2)
Basos: 1 %
EOS (ABSOLUTE): 2 10*3/uL — ABNORMAL HIGH (ref 0.0–0.4)
Eos: 13 %
Hematocrit: 38.1 % (ref 34.0–46.6)
Hemoglobin: 12.3 g/dL (ref 11.1–15.9)
Immature Grans (Abs): 0 10*3/uL (ref 0.0–0.1)
Immature Granulocytes: 0 %
Lymphocytes Absolute: 3.6 10*3/uL — ABNORMAL HIGH (ref 0.7–3.1)
Lymphs: 24 %
MCH: 27.8 pg (ref 26.6–33.0)
MCHC: 32.3 g/dL (ref 31.5–35.7)
MCV: 86 fL (ref 79–97)
Monocytes Absolute: 0.8 10*3/uL (ref 0.1–0.9)
Monocytes: 5 %
Neutrophils Absolute: 8.4 10*3/uL — ABNORMAL HIGH (ref 1.4–7.0)
Neutrophils: 57 %
Platelets: 404 10*3/uL (ref 150–450)
RBC: 4.43 x10E6/uL (ref 3.77–5.28)
RDW: 13.1 % (ref 11.7–15.4)
WBC: 14.9 10*3/uL — ABNORMAL HIGH (ref 3.4–10.8)

## 2022-11-20 LAB — HEMOGLOBIN A1C
Est. average glucose Bld gHb Est-mCnc: 146 mg/dL
Hgb A1c MFr Bld: 6.7 % — ABNORMAL HIGH (ref 4.8–5.6)

## 2022-11-20 LAB — LIPASE: Lipase: 287 U/L — ABNORMAL HIGH (ref 14–72)

## 2022-11-20 LAB — H. PYLORI BREATH TEST: H pylori Breath Test: NEGATIVE

## 2022-11-22 ENCOUNTER — Telehealth: Payer: Self-pay

## 2022-11-22 DIAGNOSIS — R12 Heartburn: Secondary | ICD-10-CM | POA: Diagnosis not present

## 2022-11-22 DIAGNOSIS — E860 Dehydration: Secondary | ICD-10-CM | POA: Diagnosis not present

## 2022-11-22 DIAGNOSIS — R1032 Left lower quadrant pain: Secondary | ICD-10-CM | POA: Diagnosis not present

## 2022-11-22 DIAGNOSIS — G4733 Obstructive sleep apnea (adult) (pediatric): Secondary | ICD-10-CM | POA: Diagnosis not present

## 2022-11-22 DIAGNOSIS — E119 Type 2 diabetes mellitus without complications: Secondary | ICD-10-CM | POA: Diagnosis not present

## 2022-11-22 DIAGNOSIS — R197 Diarrhea, unspecified: Secondary | ICD-10-CM | POA: Diagnosis not present

## 2022-11-22 DIAGNOSIS — M546 Pain in thoracic spine: Secondary | ICD-10-CM | POA: Diagnosis not present

## 2022-11-22 DIAGNOSIS — E785 Hyperlipidemia, unspecified: Secondary | ICD-10-CM | POA: Diagnosis not present

## 2022-11-22 DIAGNOSIS — I440 Atrioventricular block, first degree: Secondary | ICD-10-CM | POA: Diagnosis not present

## 2022-11-22 DIAGNOSIS — R1110 Vomiting, unspecified: Secondary | ICD-10-CM | POA: Diagnosis not present

## 2022-11-22 DIAGNOSIS — R748 Abnormal levels of other serum enzymes: Secondary | ICD-10-CM | POA: Diagnosis not present

## 2022-11-22 DIAGNOSIS — R0981 Nasal congestion: Secondary | ICD-10-CM | POA: Diagnosis not present

## 2022-11-22 DIAGNOSIS — R011 Cardiac murmur, unspecified: Secondary | ICD-10-CM | POA: Diagnosis not present

## 2022-11-22 DIAGNOSIS — I10 Essential (primary) hypertension: Secondary | ICD-10-CM | POA: Diagnosis not present

## 2022-11-22 NOTE — Telephone Encounter (Signed)
Called patient-- Advised as she is not improved, I am concerned over dehydration given AKI and pancreatitis recommending she go to ED for IV fluids at the least Pt does not want to go to ED but understands my recommendations  Again my dispo is for her to go to the ED

## 2022-11-23 ENCOUNTER — Other Ambulatory Visit: Payer: Self-pay

## 2022-11-23 ENCOUNTER — Encounter: Payer: Self-pay | Admitting: Ophthalmology

## 2022-11-23 DIAGNOSIS — R109 Unspecified abdominal pain: Secondary | ICD-10-CM | POA: Diagnosis not present

## 2022-11-25 NOTE — Discharge Instructions (Signed)

## 2022-11-29 ENCOUNTER — Ambulatory Visit: Payer: Self-pay | Admitting: *Deleted

## 2022-11-29 ENCOUNTER — Ambulatory Visit (INDEPENDENT_AMBULATORY_CARE_PROVIDER_SITE_OTHER): Payer: Medicare Other | Admitting: Physician Assistant

## 2022-11-29 ENCOUNTER — Encounter: Payer: Self-pay | Admitting: Physician Assistant

## 2022-11-29 VITALS — BP 116/79 | HR 68 | Temp 97.6°F | Ht 61.0 in | Wt 169.0 lb

## 2022-11-29 DIAGNOSIS — R1110 Vomiting, unspecified: Secondary | ICD-10-CM | POA: Diagnosis not present

## 2022-11-29 DIAGNOSIS — R197 Diarrhea, unspecified: Secondary | ICD-10-CM

## 2022-11-29 MED ORDER — METOCLOPRAMIDE HCL 5 MG PO TABS: 5.0000 mg | ORAL_TABLET | Freq: Four times a day (QID) | ORAL | 0 refills | Status: DC

## 2022-11-29 NOTE — Telephone Encounter (Signed)
Summary: Vomitting & Diarrhea Advice   Pt is calling to report that she went to ED last Monday to get fluid because she had vomiting and diarrhea. Pt is calling to report that the vomitting and diarrhea has returned at 6am this morning. Please advise      Called patient [phone] to review sx N/V. No answer. VM bx full. Unable to leave message to call back.

## 2022-11-29 NOTE — Telephone Encounter (Signed)
  Chief Complaint: Vomiting - diarrhea Symptoms: above Frequency: 4 weeks Pertinent Negatives: Patient denies  Disposition: '[]'$ ED /'[]'$ Urgent Care (no appt availability in office) / '[x]'$ Appointment(In office/virtual)/ '[]'$  [facility]/ '[]'$ Home Care/ '[]'$ Refused Recommended Disposition /'[]'$ [facility]/ '[]'$  Follow-up with PCP Additional Notes: PT states that she has had vomiting and diarrhea for 4 weeks. It had resolved, but returned this morning. PT was already seen for this. PT thinks it may be d/t medication changes. Pt was switched to a different insulin and Ozempic was added to medications. PT did not take Ozempic this week. She was told to hold this medication until after her procedure.   Summary: Vomitting & Diarrhea Advice   Pt is calling to report that she went to ED last Monday to get fluid because she had vomiting and diarrhea. Pt is calling to report that the vomitting and diarrhea has returned at 6am this morning. Please advise     Reason for Disposition  [1] MILD or MODERATE vomiting AND [2] present > 48 hours (2 days) (Exception: Mild vomiting with associated diarrhea.)  Answer Assessment - Initial Assessment Questions 1. VOMITING SEVERITY: "How many times have you vomited in the past 24 hours?"     - MILD:  1 - 2 times/day    - MODERATE: 3 - 5 times/day, decreased oral intake without significant weight loss or symptoms of dehydration    - SEVERE: 6 or more times/day, vomits everything or nearly everything, with significant weight loss, symptoms of dehydration      Mild 2. ONSET: "When did the vomiting begin?"      This morning 3. FLUIDS: "What fluids or food have you vomited up today?" "Have you been able to keep any fluids down?"      4. ABDOMEN PAIN: "Are your having any abdomen pain?" If Yes : "How bad is it and what does it feel like?" (e.g., crampy, dull, intermittent, constant)       5. DIARRHEA: "Is there any diarrhea?" If Yes, ask: "How many times today?"       yes 6. CONTACTS: "Is there anyone else in the family with the same symptoms?"      no 7. CAUSE: "What do you think is causing your vomiting?"     Medications? 8. HYDRATION STATUS: "Any signs of dehydration?" (e.g., dry mouth [not only dry lips], too weak to stand) "When did you last urinate?"      9. OTHER SYMPTOMS: "Do you have any other symptoms?" (e.g., fever, headache, vertigo, vomiting blood or coffee grounds, recent head injury)      10. PREGNANCY: "Is there any chance you are pregnant?" "When was your last menstrual period?"  Protocols used: Vomiting-A-AH

## 2022-11-29 NOTE — Progress Notes (Signed)
Established patient visit   Patient: Shari Lee   DOB: [birth year]   67 y.o. Female  MRN: [id number] Visit Date: 11/29/2022  Today's healthcare provider: [name], PA-C   Cc. Nausea, vomiting, diarrhea  Subjective    HPI  Pt returns for nausea, vomiting, diarrhea x 3 weeks. Pt was seen in our office 11/19/22, bw returned Cr of 1.14, lipase of 287, WBC of 14.9. Prescribed zofran. Advised pt if no improvement to go to ED.  Was seen in ED 11/22/22, CT normal, WBC 14.8, urine with few bacteria, culture with only mixed floral. Given fluids and d/c home with zofran.   Pt is throwing up a thicker substance instead of a liquid like previously.Reports generalized abdominal pain.  She is wondering if it is her ozempic as she increased to 1 mg around 3 weeks ago.    Medications: Outpatient Medications Prior to Visit  Medication Sig   albuterol (VENTOLIN HFA) 108 (90 Base) MCG/ACT inhaler Inhale 2 puffs into the lungs every 6 (six) hours as needed for wheezing or shortness of breath.   amLODipine-benazepril (LOTREL) 5-20 MG capsule Take 1 capsule by mouth daily.   atenolol (TENORMIN) 50 MG tablet Take 1 tablet by mouth once daily   Blood Glucose Monitoring Suppl (CONTOUR NEXT USB MONITOR) w/Device KIT 1 kit by Does not apply route daily. To check blood sugar once daily.   CareTouch Safety Lancets 26G MISC 1 Device by Does not apply route daily. To check blood sugar once daily.   cetirizine (ZYRTEC) 10 MG tablet Take 1 tablet (10 mg total) by mouth daily.   Continuous Blood Gluc Receiver (FREESTYLE LIBRE 2 READER) DEVI Use to monitor blood sugars continuously as prescribed.   Continuous Blood Gluc Sensor (FREESTYLE LIBRE 2 SENSOR) MISC USE TO MONITOR BLOOD SUGAR CONTINUOUSLY AS PRESCRIBED   dapagliflozin propanediol (FARXIGA) 10 MG TABS tablet Take 1 tablet (10 mg total) by mouth daily. Patient receives through [employer] Patient Assistance   fluticasone (FLONASE) 50 MCG/ACT nasal  spray Place 2 sprays into both nostrils daily.   glucose blood (CONTOUR NEXT TEST) test strip To check blood sugar once daily.   Insulin Degludec ([name]) 100 UNIT/ML SOLN Inject 28 Units into the skin at bedtime.   Insulin Glargine Solostar (LANTUS) 100 UNIT/ML Solostar Pen Inject 28 Units into the skin daily. Patient receives through [employer] Patient Assistance through Dec 2023   Insulin Pen Needle (BD PEN NEEDLE NANO 2ND GEN) 32G X 4 MM MISC To use with Lantus injections.   Insulin Pen Needle (NOVOFINE PLUS) 32G X 4 MM MISC To use with ozempic injections   montelukast (SINGULAIR) 10 MG tablet Take 1 tablet (10 mg total) by mouth at bedtime.   omeprazole (PRILOSEC) 20 MG capsule Take 20 mg by mouth daily.   ondansetron (ZOFRAN-ODT) 4 MG disintegrating tablet Take 1 tablet (4 mg total) by mouth every 8 (eight) hours as needed for nausea or vomiting.   rosuvastatin (CRESTOR) 5 MG tablet Take 1 tablet by mouth once daily   Semaglutide,0.25 or 0.5MG/DOS, (OZEMPIC, 0.25 OR 0.5 MG/DOSE,) 2 MG/1.5ML SOPN Inject 0.5 mg into the skin once a week. Patient receives via [employer] Patient Assistance through Dec 2023   traZODone (DESYREL) 50 MG tablet Take 0.5 tablets (25 mg total) by mouth at bedtime as needed for sleep.   No facility-administered medications prior to visit.    Review of Systems  Constitutional:  Negative for fatigue and fever.  Respiratory:  Negative  for cough and shortness of breath.   Cardiovascular:  Negative for chest pain and leg swelling.  Gastrointestinal:  Positive for abdominal pain, constipation, diarrhea, nausea and vomiting.  Neurological:  Negative for dizziness and headaches.      Objective    Blood pressure 116/79, pulse 68, temperature 97.6 F (36.4 C), height 5' 1"$  (1.549 m), weight 169 lb (76.7 kg), SpO2 97 %.   Physical Exam Constitutional:      General: She is awake.     Appearance: She is well-developed.  HENT:     Head: Normocephalic.  Eyes:      Conjunctiva/sclera: Conjunctivae normal.  Cardiovascular:     Rate and Rhythm: Normal rate and regular rhythm.     Heart sounds: Normal heart sounds.  Pulmonary:     Effort: Pulmonary effort is normal.     Breath sounds: Normal breath sounds.  Abdominal:     Palpations: Abdomen is soft.     Tenderness: There is no abdominal tenderness. There is no guarding.  Skin:    General: Skin is warm.  Neurological:     Mental Status: She is alert and oriented to person, place, and time.  Psychiatric:        Attention and Perception: Attention normal.        Mood and Affect: Mood normal.        Speech: Speech normal.        Behavior: Behavior is cooperative.     No results found for any visits on 11/29/22.  Assessment & Plan     Nausea, vomiting, diarrhea Will repeat labs, cbc, cmp, lipase Will check stool cultures. Reviewed ct.  Continue with pushing fluids, bland foods Okay with skipping ozempic this week (and last week as pt self skipped) Gave sample of 0.25-0.5 mg dose to go back down to 0.5 mg if her symptoms are improved  Return if symptoms worsen or fail to improve.     I, [name], PA-C have reviewed all documentation for this visit. The documentation on 11/29/22 for the exam, diagnosis, procedures, and orders are all accurate and complete.  [name], PA-C [facility] [address] [place], [place], [address] Office: [phone] Fax: [place]

## 2022-11-30 ENCOUNTER — Encounter
Admission: RE | Disposition: A | Discharge status: Home / Self Care | Payer: Self-pay | Source: Home / Self Care | Attending: Ophthalmology

## 2022-11-30 ENCOUNTER — Other Ambulatory Visit: Payer: Self-pay

## 2022-11-30 ENCOUNTER — Encounter: Payer: Self-pay | Admitting: Ophthalmology

## 2022-11-30 ENCOUNTER — Ambulatory Visit: Payer: Medicare Other | Admitting: Anesthesiology

## 2022-11-30 ENCOUNTER — Ambulatory Visit
Admission: RE | Admit: 2022-11-30 | Discharge: 2022-11-30 | Disposition: A | Discharge status: Home / Self Care | Payer: Medicare Other | Attending: Ophthalmology | Admitting: Ophthalmology

## 2022-11-30 DIAGNOSIS — E1136 Type 2 diabetes mellitus with diabetic cataract: Secondary | ICD-10-CM | POA: Diagnosis not present

## 2022-11-30 DIAGNOSIS — E1165 Type 2 diabetes mellitus with hyperglycemia: Secondary | ICD-10-CM | POA: Insufficient documentation

## 2022-11-30 DIAGNOSIS — H9190 Unspecified hearing loss, unspecified ear: Secondary | ICD-10-CM | POA: Insufficient documentation

## 2022-11-30 DIAGNOSIS — H2511 Age-related nuclear cataract, right eye: Secondary | ICD-10-CM | POA: Insufficient documentation

## 2022-11-30 DIAGNOSIS — I10 Essential (primary) hypertension: Secondary | ICD-10-CM | POA: Diagnosis not present

## 2022-11-30 DIAGNOSIS — Z794 Long term (current) use of insulin: Secondary | ICD-10-CM | POA: Diagnosis not present

## 2022-11-30 DIAGNOSIS — J45909 Unspecified asthma, uncomplicated: Secondary | ICD-10-CM | POA: Insufficient documentation

## 2022-11-30 DIAGNOSIS — G4730 Sleep apnea, unspecified: Secondary | ICD-10-CM | POA: Insufficient documentation

## 2022-11-30 DIAGNOSIS — K219 Gastro-esophageal reflux disease without esophagitis: Secondary | ICD-10-CM | POA: Insufficient documentation

## 2022-11-30 DIAGNOSIS — Z7985 Long-term (current) use of injectable non-insulin antidiabetic drugs: Secondary | ICD-10-CM | POA: Insufficient documentation

## 2022-11-30 HISTORY — DX: Cardiac murmur, unspecified: R01.1

## 2022-11-30 HISTORY — DX: Gastro-esophageal reflux disease without esophagitis: K21.9

## 2022-11-30 HISTORY — DX: Unspecified asthma, uncomplicated: J45.909

## 2022-11-30 HISTORY — DX: Unspecified hearing loss, unspecified ear: H91.90

## 2022-11-30 HISTORY — DX: Motion sickness, initial encounter: T75.3XXA

## 2022-11-30 HISTORY — DX: Diarrhea, unspecified: R19.7

## 2022-11-30 HISTORY — PX: CATARACT EXTRACTION W/PHACO: SHX586

## 2022-11-30 LAB — CBC WITH DIFFERENTIAL/PLATELET
Basophils Absolute: 0.1 10*3/uL (ref 0.0–0.2)
Basos: 0 %
EOS (ABSOLUTE): 1.4 10*3/uL — ABNORMAL HIGH (ref 0.0–0.4)
Eos: 8 %
Hematocrit: 41.4 % (ref 34.0–46.6)
Hemoglobin: 13.5 g/dL (ref 11.1–15.9)
Immature Grans (Abs): 0 10*3/uL (ref 0.0–0.1)
Immature Granulocytes: 0 %
Lymphocytes Absolute: 2.7 10*3/uL (ref 0.7–3.1)
Lymphs: 15 %
MCH: 28.4 pg (ref 26.6–33.0)
MCHC: 32.6 g/dL (ref 31.5–35.7)
MCV: 87 fL (ref 79–97)
Monocytes Absolute: 1 10*3/uL — ABNORMAL HIGH (ref 0.1–0.9)
Monocytes: 6 %
Neutrophils Absolute: 12.4 10*3/uL — ABNORMAL HIGH (ref 1.4–7.0)
Neutrophils: 71 %
Platelets: 425 10*3/uL (ref 150–450)
RBC: 4.75 x10E6/uL (ref 3.77–5.28)
RDW: 13.7 % (ref 11.7–15.4)
WBC: 17.5 10*3/uL — ABNORMAL HIGH (ref 3.4–10.8)

## 2022-11-30 LAB — COMPREHENSIVE METABOLIC PANEL
ALT: 13 IU/L (ref 0–32)
AST: 16 IU/L (ref 0–40)
Albumin/Globulin Ratio: 1.8 (ref 1.2–2.2)
Albumin: 4.4 g/dL (ref 3.9–4.9)
Alkaline Phosphatase: 83 IU/L (ref 44–121)
BUN/Creatinine Ratio: 18 (ref 12–28)
BUN: 17 mg/dL (ref 8–27)
Bilirubin Total: 0.4 mg/dL (ref 0.0–1.2)
CO2: 20 mmol/L (ref 20–29)
Calcium: 9.6 mg/dL (ref 8.7–10.3)
Chloride: 103 mmol/L (ref 96–106)
Creatinine, Ser: 0.93 mg/dL (ref 0.57–1.00)
Globulin, Total: 2.4 g/dL (ref 1.5–4.5)
Glucose: 128 mg/dL — ABNORMAL HIGH (ref 70–99)
Potassium: 5 mmol/L (ref 3.5–5.2)
Sodium: 141 mmol/L (ref 134–144)
Total Protein: 6.8 g/dL (ref 6.0–8.5)
eGFR: 68 mL/min/{1.73_m2} (ref >59)

## 2022-11-30 LAB — GLUCOSE, CAPILLARY: Glucose-Capillary: 120 mg/dL — ABNORMAL HIGH (ref 70–99)

## 2022-11-30 LAB — LIPASE: Lipase: 32 U/L (ref 14–72)

## 2022-11-30 SURGERY — PHACOEMULSIFICATION, CATARACT, WITH IOL INSERTION
Anesthesia: Monitor Anesthesia Care | Site: Eye | Laterality: Right

## 2022-11-30 MED ORDER — BRIMONIDINE TARTRATE-TIMOLOL 0.2-0.5 % OP SOLN
OPHTHALMIC | Status: DC | PRN
  Administered 2022-11-30: 1 [drp] via OPHTHALMIC

## 2022-11-30 MED ORDER — SIGHTPATH DOSE#1 NA CHONDROIT SULF-NA HYALURON 40-17 MG/ML IO SOLN
INTRAOCULAR | Status: DC | PRN
  Administered 2022-11-30: 1 mL via INTRAOCULAR

## 2022-11-30 MED ORDER — MOXIFLOXACIN HCL 0.5 % OP SOLN
OPHTHALMIC | Status: DC | PRN
  Administered 2022-11-30: .2 mL via OPHTHALMIC

## 2022-11-30 MED ORDER — SIGHTPATH DOSE#1 BSS IO SOLN
INTRAOCULAR | Status: DC | PRN
  Administered 2022-11-30: 15 mL

## 2022-11-30 MED ORDER — SIGHTPATH DOSE#1 BSS IO SOLN
INTRAOCULAR | Status: DC | PRN
  Administered 2022-11-30: 1 mL

## 2022-11-30 MED ORDER — FENTANYL CITRATE (PF) 100 MCG/2ML IJ SOLN
INTRAMUSCULAR | Status: DC | PRN
  Administered 2022-11-30 (×2): 50 ug via INTRAVENOUS

## 2022-11-30 MED ORDER — SIGHTPATH DOSE#1 BSS IO SOLN
INTRAOCULAR | Status: DC | PRN
  Administered 2022-11-30: 60 mL via OPHTHALMIC

## 2022-11-30 MED ORDER — ARMC OPHTHALMIC DILATING DROPS
1.0000 | OPHTHALMIC | Status: DC | PRN
  Administered 2022-11-30 (×3): 1 via OPHTHALMIC

## 2022-11-30 MED ORDER — MIDAZOLAM HCL 2 MG/2ML IJ SOLN
INTRAMUSCULAR | Status: DC | PRN
  Administered 2022-11-30: 2 mg via INTRAVENOUS

## 2022-11-30 MED ORDER — TETRACAINE HCL 0.5 % OP SOLN
1.0000 [drp] | OPHTHALMIC | Status: DC | PRN
  Administered 2022-11-30 (×3): 1 [drp] via OPHTHALMIC

## 2022-11-30 SURGICAL SUPPLY — 15 items
CANNULA ANT/CHMB 27G (MISCELLANEOUS) IMPLANT
CANNULA ANT/CHMB 27GA (MISCELLANEOUS) IMPLANT
CATARACT SUITE SIGHTPATH (MISCELLANEOUS) ×1 IMPLANT
FEE CATARACT SUITE SIGHTPATH (MISCELLANEOUS) ×1 IMPLANT
GLOVE BIOGEL PI IND STRL 8 (GLOVE) ×1 IMPLANT
GLOVE SURG ENC TEXT LTX SZ8 (GLOVE) ×1 IMPLANT
LENS IOL TECNIS EYHANCE 23.0 (Intraocular Lens) IMPLANT
NDL FILTER BLUNT 18X1 1/2 (NEEDLE) ×1 IMPLANT
NEEDLE FILTER BLUNT 18X1 1/2 (NEEDLE) ×1 IMPLANT
PACK VIT ANT 23G (MISCELLANEOUS) IMPLANT
RING MALYGIN (MISCELLANEOUS) IMPLANT
SUT ETHILON 10-0 CS-B-6CS-B-6 (SUTURE)
SUTURE EHLN 10-0 CS-B-6CS-B-6 (SUTURE) IMPLANT
SYR 3ML LL SCALE MARK (SYRINGE) ×1 IMPLANT
WATER STERILE IRR 250ML POUR (IV SOLUTION) ×1 IMPLANT

## 2022-11-30 NOTE — Anesthesia Preprocedure Evaluation (Signed)
Anesthesia Evaluation  Patient identified by MRN, date of birth, ID band Patient awake    Reviewed: Allergy & Precautions, NPO status , Patient's Chart, lab work & pertinent test results  History of Anesthesia Complications Negative for: history of anesthetic complications  Airway Mallampati: III  TM Distance: >3 FB Neck ROM: full    Dental no notable dental hx.    Pulmonary asthma , sleep apnea and Continuous Positive Airway Pressure Ventilation    Pulmonary exam normal        Cardiovascular hypertension, Normal cardiovascular exam+ Valvular Problems/Murmurs      Neuro/Psych negative neurological ROS  negative psych ROS   GI/Hepatic Neg liver ROS,GERD  Medicated,,  Endo/Other  negative endocrine ROSdiabetes, Insulin Dependent    Renal/GU negative Renal ROS  negative genitourinary   Musculoskeletal   Abdominal   Peds  Hematology negative hematology ROS (+)   Anesthesia Other Findings Past Medical History: No date: Asthma     Comment:  mild No date: Diarrhea     Comment:  N/V recently No date: Dyspnea No date: GERD (gastroesophageal reflux disease) No date: Headache No date: Heart murmur     Comment:  developed in last couple years/ no issues No date: HOH (hard of hearing)     Comment:  wears aids No date: Hypertension No date: Motion sickness     Comment:  car No date: Type 2 diabetes mellitus with hyperglycemia ([place])  Past Surgical History: 2006: ABDOMINAL HYSTERECTOMY 2005: BREAST BIOPSY; Left     Comment:  benign 2005?: BREAST EXCISIONAL BIOPSY; Left     Comment:   -benign 08/13/2016: COLONOSCOPY WITH PROPOFOL; N/A     Comment:  Procedure: COLONOSCOPY WITH PROPOFOL;  Surgeon: Lucilla Lame, MD;  Location: [place];  Service:               Endoscopy;  Laterality: N/A; 02/09/2022: COLONOSCOPY WITH PROPOFOL; N/A     Comment:  Procedure: COLONOSCOPY WITH PROPOFOL;  Surgeon:  Lucilla Lame, MD;  Location: [facility] ENDOSCOPY;  Service:               Endoscopy;  Laterality: N/A; 08/13/2016: POLYPECTOMY     Comment:  Procedure: POLYPECTOMY;  Surgeon: Lucilla Lame, MD;                Location: [place];  Service: Endoscopy;;  BMI    Body Mass Index: 31.86 kg/m      Reproductive/Obstetrics negative OB ROS                             Anesthesia Physical Anesthesia Plan  ASA: 3  Anesthesia Plan: General   Post-op Pain Management: Minimal or no pain anticipated   Induction: Intravenous  PONV Risk Score and Plan: Propofol infusion and TIVA  Airway Management Planned: Natural Airway and Nasal Cannula  Additional Equipment:   Intra-op Plan:   Post-operative Plan:   Informed Consent: I have reviewed the patients History and Physical, chart, labs and discussed the procedure including the risks, benefits and alternatives for the proposed anesthesia with the patient or authorized representative who has indicated his/her understanding and acceptance.     Dental Advisory Given  Plan Discussed with: Anesthesiologist, CRNA and Surgeon  Anesthesia Plan Comments: (Patient consented for risks of anesthesia  including but not limited to:  - adverse reactions to medications - damage to eyes, teeth, lips or other oral mucosa - nerve damage due to positioning  - sore throat or hoarseness - Damage to heart, brain, nerves, lungs, other parts of body or loss of life  Patient voiced understanding.)       Anesthesia Quick Evaluation

## 2022-11-30 NOTE — Transfer of Care (Signed)
Immediate Anesthesia Transfer of Care Note  Patient: Shari Lee  Procedure(s) Performed: CATARACT EXTRACTION PHACO AND INTRAOCULAR LENS PLACEMENT (IOC) RIGHT DIABETIC (Right: Eye)  Patient Location: PACU  Anesthesia Type: MAC  Level of Consciousness: awake, alert  and patient cooperative  Airway and Oxygen Therapy: Patient Spontanous Breathing and Patient connected to supplemental oxygen  Post-op Assessment: Post-op Vital signs reviewed, Patient's Cardiovascular Status Stable, Respiratory Function Stable, Patent Airway and No signs of Nausea or vomiting  Post-op Vital Signs: Reviewed and stable  Complications: No notable events documented.

## 2022-11-30 NOTE — Op Note (Signed)
PREOPERATIVE DIAGNOSIS:  Nuclear sclerotic cataract of the right eye.   POSTOPERATIVE DIAGNOSIS:  Cataract   OPERATIVE PROCEDURE:ORPROCALL@   SURGEON:  Birder Robson, MD.   ANESTHESIA:  Anesthesiologist: Ilene Qua, MD CRNA: Tobie Poet, CRNA  1.      Managed anesthesia care. 2.      0.78m of Shugarcaine was instilled in the eye following the paracentesis.   COMPLICATIONS:  None.   TECHNIQUE:   Stop and chop   DESCRIPTION OF PROCEDURE:  The patient was examined and consented in the preoperative holding area where the aforementioned topical anesthesia was applied to the right eye and then brought back to the Operating Room where the right eye was prepped and draped in the usual sterile ophthalmic fashion and a lid speculum was placed. A paracentesis was created with the side port blade and the anterior chamber was filled with viscoelastic. A near clear corneal incision was performed with the steel keratome. A continuous curvilinear capsulorrhexis was performed with a cystotome followed by the capsulorrhexis forceps. Hydrodissection and hydrodelineation were carried out with BSS on a blunt cannula. The lens was removed in a stop and chop  technique and the remaining cortical material was removed with the irrigation-aspiration handpiece. The capsular bag was inflated with viscoelastic and the Technis [id number]  lens was placed in the capsular bag without complication. The remaining viscoelastic was removed from the eye with the irrigation-aspiration handpiece. The wounds were hydrated. The anterior chamber was flushed with BSS and the eye was inflated to physiologic pressure. 0.185mof Vigamox was placed in the anterior chamber. The wounds were found to be water tight. The eye was dressed with Combigan. The patient was given protective glasses to wear throughout the day and a shield with which to sleep tonight. The patient was also given drops with which to begin a drop regimen today and  will follow-up with me in one day. Implant Name Type Inv. Item Serial No. Manufacturer Lot No. LRB No. Used Action  LENS IOL TECNIS EYHANCE 23.0 - [id number] Lens LENS IOL TECNIS EYHANCE 23.0 [id number]  Right 1 Implanted   Procedure(s) with comments: CATARACT EXTRACTION PHACO AND INTRAOCULAR LENS PLACEMENT (IOC) RIGHT DIABETIC (Right) - 4.96 0:37.3  Electronically signed: WiBirder Robson/13/2024 8:20 AM

## 2022-11-30 NOTE — H&P (Signed)
[facility]   Primary Care Physician:  [name], PA-C Ophthalmologist: Dr. Hortense Ramal  Pre-Procedure History & Physical: HPI:  Shari Lee is a 67 y.o. female here for cataract surgery.   Past Medical History:  Diagnosis Date   Asthma    mild   Diarrhea    N/V recently   Dyspnea    GERD (gastroesophageal reflux disease)    Headache    Heart murmur    developed in last couple years/ no issues   HOH (hard of hearing)    wears aids   Hypertension    Motion sickness    car   Type 2 diabetes mellitus with hyperglycemia ([place])     Past Surgical History:  Procedure Laterality Date   ABDOMINAL HYSTERECTOMY  2006   BREAST BIOPSY Left 2005   benign   BREAST EXCISIONAL BIOPSY Left 2005?    -benign   COLONOSCOPY WITH PROPOFOL N/A 08/13/2016   Procedure: COLONOSCOPY WITH PROPOFOL;  Surgeon: Lucilla Lame, MD;  Location: [place];  Service: Endoscopy;  Laterality: N/A;   COLONOSCOPY WITH PROPOFOL N/A 02/09/2022   Procedure: COLONOSCOPY WITH PROPOFOL;  Surgeon: Lucilla Lame, MD;  Location: [facility] ENDOSCOPY;  Service: Endoscopy;  Laterality: N/A;   POLYPECTOMY  08/13/2016   Procedure: POLYPECTOMY;  Surgeon: Lucilla Lame, MD;  Location: [place];  Service: Endoscopy;;    Prior to Admission medications   Medication Sig Start Date End Date Taking? Authorizing Provider  albuterol (VENTOLIN HFA) 108 (90 Base) MCG/ACT inhaler Inhale 2 puffs into the lungs every 6 (six) hours as needed for wheezing or shortness of breath. 07/22/22  Yes Tally Joe T, FNP  amLODipine-benazepril (LOTREL) 5-20 MG capsule Take 1 capsule by mouth daily. 07/08/22  Yes [name], PA-C  atenolol (TENORMIN) 50 MG tablet Take 1 tablet by mouth once daily 11/09/22  Yes Drubel, Ria Comment, PA-C  dapagliflozin propanediol (FARXIGA) 10 MG TABS tablet Take 1 tablet (10 mg total) by mouth daily. Patient receives through [place] Patient Assistance 07/09/22  Yes Drubel, Ria Comment, PA-C  fluticasone  [facility]) 50 MCG/ACT nasal spray Place 2 sprays into both nostrils daily. 07/22/22  Yes Gwyneth Sprout, FNP  Insulin Degludec ([name]) 100 UNIT/ML SOLN Inject 28 Units into the skin at bedtime.   Yes [provider]  Insulin Glargine Solostar (LANTUS) 100 UNIT/ML Solostar Pen Inject 28 Units into the skin daily. Patient receives through [facility] Patient Assistance through Dec 2023 05/31/22  Yes Drubel, Ria Comment, PA-C  metoCLOPramide (REGLAN) 5 MG tablet Take 1 tablet (5 mg total) by mouth 4 (four) times daily. 11/29/22  Yes [name], PA-C  omeprazole (PRILOSEC) 20 MG capsule Take 20 mg by mouth daily.   Yes [provider]  ondansetron (ZOFRAN-ODT) 4 MG disintegrating tablet Take 1 tablet (4 mg total) by mouth every 8 (eight) hours as needed for nausea or vomiting. 11/19/22  Yes [name], PA-C  rosuvastatin (CRESTOR) 5 MG tablet Take 1 tablet by mouth once daily 11/09/22  Yes Drubel, Ria Comment, PA-C  traZODone (DESYREL) 50 MG tablet Take 0.5 tablets (25 mg total) by mouth at bedtime as needed for sleep. 11/19/22  Yes [name], PA-C  Blood Glucose Monitoring Suppl (CONTOUR NEXT USB MONITOR) w/Device KIT 1 kit by Does not apply route daily. To check blood sugar once daily. 10/03/19   Mar Daring, PA-C  CareTouch Safety Lancets 26G MISC 1 Device by Does not apply route daily. To check blood sugar once daily. 10/03/19   Fenton Malling  M, PA-C  cetirizine (ZYRTEC) 10 MG tablet Take 1 tablet (10 mg total) by mouth daily. 07/22/22   Gwyneth Sprout, FNP  Continuous Blood Gluc Receiver (FREESTYLE LIBRE 2 READER) DEVI Use to monitor blood sugars continuously as prescribed. 03/30/22   [name], PA-C  Continuous Blood Gluc Sensor (FREESTYLE LIBRE 2 SENSOR) MISC USE TO MONITOR BLOOD SUGAR CONTINUOUSLY AS PRESCRIBED 09/06/22   Drubel, Ria Comment, PA-C  glucose blood (CONTOUR NEXT TEST) test strip To check blood sugar once daily. 11/19/20   Mar Daring, PA-C  Insulin  Pen Needle (BD PEN NEEDLE NANO 2ND GEN) 32G X 4 MM MISC To use with Lantus injections. 01/09/21   Mar Daring, PA-C  Insulin Pen Needle (NOVOFINE PLUS) 32G X 4 MM MISC To use with ozempic injections 01/31/19   Fenton Malling M, PA-C  montelukast (SINGULAIR) 10 MG tablet Take 1 tablet (10 mg total) by mouth at bedtime. 07/22/22   Gwyneth Sprout, FNP  Semaglutide,0.25 or 0.5MG/DOS, (OZEMPIC, 0.25 OR 0.5 MG/DOSE,) 2 MG/1.5ML SOPN Inject 0.5 mg into the skin once a week. Patient receives via [employer] Patient Assistance through Dec 2023 03/30/22   Drubel, Ria Comment, PA-C    Allergies as of 11/01/2022 - Review Complete 08/17/2022  Allergen Reaction Noted   Amoxicillin-pot clavulanate  04/29/2015   Levsin  [hyoscyamine]  04/29/2015   Penicillins  04/29/2015   Metformin Diarrhea 07/16/2016   Metformin and related Diarrhea 07/16/2016    Family History  Problem Relation Age of Onset   Heart attack Mother    Diabetes Mother    Heart disease Mother    Heart failure Father    Breast cancer Sister    Sleep apnea Brother    Diverticulitis Brother    Diverticulitis Sister     Social History   Socioeconomic History   Marital status: Divorced    Spouse name: Not on file   Number of children: Not on file   Years of education: Not on file   Highest education level: Not on file  Occupational History   Not on file  Tobacco Use   Smoking status: Never   Smokeless tobacco: Never  Vaping Use   Vaping Use: Never used  Substance and Sexual Activity   Alcohol use: No    Alcohol/week: 0.0 standard drinks of alcohol   Drug use: No   Sexual activity: Not on file  Other Topics Concern   Not on file  Social History Narrative   Not on file   Social Determinants of Health   Financial Resource Strain: High Risk (01/18/2022)   Overall Financial Resource Strain (CARDIA)    Difficulty of Paying Living Expenses: Very hard  Food Insecurity: Not on file  Transportation Needs: No  Transportation Needs (11/24/2020)   PRAPARE - Transportation    Lack of Transportation (Medical): No    Lack of Transportation (Non-Medical): No  Physical Activity: Not on file  Stress: Not on file  Social Connections: Not on file  Intimate Partner Violence: Not on file    Review of Systems: See HPI, otherwise negative ROS  Physical Exam: BP 128/68   Pulse 76   Temp (!) 97.3 F (36.3 C) (Temporal)   Resp 18   Ht 5' 1"$  (1.549 m)   Wt 76.5 kg   SpO2 97%   BMI 31.86 kg/m  General:   Alert, cooperative in NAD Head:  Normocephalic and atraumatic. Respiratory:  Normal work of breathing. Cardiovascular:  RRR  Impression/Plan: Shari Lee  is here for cataract surgery.  Risks, benefits, limitations, and alternatives regarding cataract surgery have been reviewed with the patient.  Questions have been answered.  All parties agreeable.   Birder Robson, MD  11/30/2022, 7:53 AM

## 2022-11-30 NOTE — Anesthesia Postprocedure Evaluation (Signed)
Anesthesia Post Note  Patient: Shari Lee  Procedure(s) Performed: CATARACT EXTRACTION PHACO AND INTRAOCULAR LENS PLACEMENT (IOC) RIGHT DIABETIC (Right: Eye)  Patient location during evaluation: PACU Anesthesia Type: MAC Level of consciousness: awake and alert Pain management: pain level controlled Vital Signs Assessment: post-procedure vital signs reviewed and stable Respiratory status: spontaneous breathing, nonlabored ventilation, respiratory function stable and patient connected to nasal cannula oxygen Cardiovascular status: stable and blood pressure returned to baseline Postop Assessment: no apparent nausea or vomiting Anesthetic complications: no   No notable events documented.   Last Vitals:  Vitals:   11/30/22 0825 11/30/22 0830  BP: (!) 90/54 96/60  Pulse: 76   Resp: 15   Temp:    SpO2: 96%     Last Pain:  Vitals:   11/30/22 0830  TempSrc:   PainSc: 0-No pain                 Ilene Qua

## 2022-12-01 ENCOUNTER — Encounter: Payer: Self-pay | Admitting: Ophthalmology

## 2022-12-01 ENCOUNTER — Encounter: Payer: Self-pay | Admitting: Physician Assistant

## 2022-12-01 ENCOUNTER — Other Ambulatory Visit: Payer: Self-pay

## 2022-12-02 ENCOUNTER — Other Ambulatory Visit: Payer: Self-pay

## 2022-12-02 DIAGNOSIS — D72829 Elevated white blood cell count, unspecified: Secondary | ICD-10-CM

## 2022-12-03 LAB — CDIFF NAA+O+P+STOOL CULTURE
E coli, Shiga toxin Assay: NEGATIVE
Toxigenic C. Difficile by PCR: NEGATIVE

## 2022-12-08 ENCOUNTER — Ambulatory Visit (INDEPENDENT_AMBULATORY_CARE_PROVIDER_SITE_OTHER): Payer: Medicare Other | Admitting: Physician Assistant

## 2022-12-08 ENCOUNTER — Ambulatory Visit: Payer: Self-pay | Admitting: *Deleted

## 2022-12-08 VITALS — BP 120/64 | HR 72 | Temp 97.4°F | Wt 172.0 lb

## 2022-12-08 DIAGNOSIS — R21 Rash and other nonspecific skin eruption: Secondary | ICD-10-CM | POA: Diagnosis not present

## 2022-12-08 NOTE — Progress Notes (Unsigned)
[place],acting as a Education administrator for [employer], PA-C.,have documented all relevant documentation on the behalf of Mardene Speak, PA-C,as directed by  [employer], PA-C while in the presence of [employer], PA-C.     Established patient visit   Patient: Shari Lee   DOB: May 23, 1956   67 y.o. Female  MRN: [id number] Visit Date: 12/08/2022  Today's healthcare provider: Mardene Speak, PA-C  CC: rash  Subjective    HPI  Rash: Patient complains of rash involving the left breast. Rash started at 3 am today  ago. Appearance of rash at onset: Color of lesion(s): pink. Rash has changed over time by worsening then clearing some and then getting worse again.Initial distribution: .  Discomfort associated with rash:  is itching, no pain or burning .  Associated symptoms: none. Denies: abdominal pain, arthralgia, congestion, cough, fever, headache, irritability, myalgia, nausea, sore throat, and vomiting. Patient has not had previous evaluation of rash. Patient has not had previous treatment.  Patient has not had contacts with similar rash. Patient has not identified precipitant. Patient has not had new exposures (soaps, lotions, laundry detergents, foods, medications, plants, insects or animals.)   Medications: Outpatient Medications Prior to Visit  Medication Sig   albuterol (VENTOLIN HFA) 108 (90 Base) MCG/ACT inhaler Inhale 2 puffs into the lungs every 6 (six) hours as needed for wheezing or shortness of breath.   amLODipine-benazepril (LOTREL) 5-20 MG capsule Take 1 capsule by mouth daily.   atenolol (TENORMIN) 50 MG tablet Take 1 tablet by mouth once daily   Blood Glucose Monitoring Suppl (CONTOUR NEXT USB MONITOR) w/Device KIT 1 kit by Does not apply route daily. To check blood sugar once daily.   CareTouch Safety Lancets 26G MISC 1 Device by Does not apply route daily. To check blood sugar once daily.   cetirizine (ZYRTEC) 10 MG tablet Take 1 tablet (10 mg total) by mouth daily.    Continuous Blood Gluc Receiver (FREESTYLE LIBRE 2 READER) DEVI Use to monitor blood sugars continuously as prescribed.   Continuous Blood Gluc Sensor (FREESTYLE LIBRE 2 SENSOR) MISC USE TO MONITOR BLOOD SUGAR CONTINUOUSLY AS PRESCRIBED   dapagliflozin propanediol (FARXIGA) 10 MG TABS tablet Take 1 tablet (10 mg total) by mouth daily. Patient receives through [place] Patient Assistance   fluticasone (FLONASE) 50 MCG/ACT nasal spray Place 2 sprays into both nostrils daily.   glucose blood (CONTOUR NEXT TEST) test strip To check blood sugar once daily.   Insulin Degludec ([name]) 100 UNIT/ML SOLN Inject 28 Units into the skin at bedtime.   Insulin Glargine Solostar (LANTUS) 100 UNIT/ML Solostar Pen Inject 28 Units into the skin daily. Patient receives through [employer] Patient Assistance through Dec 2023   Insulin Pen Needle (BD PEN NEEDLE NANO 2ND GEN) 32G X 4 MM MISC To use with Lantus injections.   Insulin Pen Needle (NOVOFINE PLUS) 32G X 4 MM MISC To use with ozempic injections   metoCLOPramide (REGLAN) 5 MG tablet Take 1 tablet (5 mg total) by mouth 4 (four) times daily.   montelukast (SINGULAIR) 10 MG tablet Take 1 tablet (10 mg total) by mouth at bedtime.   omeprazole (PRILOSEC) 20 MG capsule Take 20 mg by mouth daily.   ondansetron (ZOFRAN-ODT) 4 MG disintegrating tablet Take 1 tablet (4 mg total) by mouth every 8 (eight) hours as needed for nausea or vomiting.   rosuvastatin (CRESTOR) 5 MG tablet Take 1 tablet by mouth once daily   traZODone (DESYREL) 50 MG tablet Take 0.5 tablets (  25 mg total) by mouth at bedtime as needed for sleep.   [DISCONTINUED] Semaglutide,0.25 or 0.5MG/DOS, (OZEMPIC, 0.25 OR 0.5 MG/DOSE,) 2 MG/1.5ML SOPN Inject 0.5 mg into the skin once a week. Patient receives via [employer] Patient Assistance through Dec 2023   No facility-administered medications prior to visit.    Review of Systems  All other systems reviewed and are negative. Except see HPI   {Labs  Heme   Chem  Endocrine  Serology  Results Review (optional):23779}   Objective    BP 120/64 (BP Location: Left Arm, Patient Position: Sitting, Cuff Size: Normal)   Pulse 72   Temp (!) 97.4 F (36.3 C) (Oral)   Wt 172 lb (78 kg)   SpO2 100%   BMI 32.50 kg/m  {Show previous vital signs (optional):23777}  Physical Exam Constitutional:      General: She is not in acute distress.    Appearance: Normal appearance.  HENT:     Head: Normocephalic.  Pulmonary:     Effort: Pulmonary effort is normal. No respiratory distress.  Skin:    Findings: Rash present.     Comments: A few erythematous, pruritic raised patches on the left side of back and grouped into the line small papules underneath of left breast, warm on touch  Neurological:     Mental Status: She is alert and oriented to person, place, and time. Mental status is at baseline.      No results found for any visits on 12/08/22.  Assessment & Plan     1. 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 Has an atopic reaction on bite in general But there are not hx of atopic dermatitis.  Will Fu with her in 2 days if symptoms worsen. Photos were taken of her current rash Advised to communicate back if rash persists   No follow-ups on file.  The patient was advised to call back or seek an in-person evaluation if the symptoms worsen or if the condition fails to improve as anticipated.  I discussed the assessment and treatment plan with the patient. The patient was provided an opportunity to ask questions and all were answered. The patient agreed with the plan and demonstrated an understanding of the instructions.  I, Mardene Speak, PA-C have reviewed all documentation for this visit. The documentation on  12/08/22  for the exam, diagnosis, procedures, and  orders are all accurate and complete.  Mardene Speak, [facility], [place] [phone] (phone) [phone] (fax)  [place]

## 2022-12-08 NOTE — Telephone Encounter (Signed)
  Chief Complaint: Rash Symptoms: Red raised rash "Little moister than this AM." Runs from left side of back to under breast, itching, denies pain Frequency: this Am Pertinent Negatives: Patient denies fever Disposition: []$ ED /[]$ Urgent Care (no appt availability in office) / [x]$ Appointment(In office/virtual)/ []$  [place] [facility]/ []$ Home Care/ []$ Refused Recommended Disposition /[]$ [place] [facility]/ []$  Follow-up with PCP Additional Notes: Appt secured for this afternoon. Care advise provided, pt verbalizes understanding.  Reason for Disposition  SEVERE itching (i.e., interferes with sleep, normal activities or school)  Answer Assessment - Initial Assessment Questions 1. APPEARANCE of RASH: "Describe the rash." (e.g., spots, blisters, raised areas, skin peeling, scaly)     *No Answer* 2. SIZE: "How big are the spots?" (e.g., tip of pen, eraser, coin; inches, centimeters)     *No Answer* 3. LOCATION: "Where is the rash located?"     *No Answer* 4. COLOR: "What color is the rash?" (Note: It is difficult to assess rash color in people with darker-colored skin. When this situation occurs, simply ask the caller to describe what they see.)     *No Answer* 5. ONSET: "When did the rash begin?"     *No Answer* 6. FEVER: "Do you have a fever?" If Yes, ask: "What is your temperature, how was it measured, and when did it start?"     *No Answer* 7. ITCHING: "Does the rash itch?" If Yes, ask: "How bad is the itch?" (Scale 1-10; or mild, moderate, severe)     *No Answer* 8. CAUSE: "What do you think is causing the rash?"     *No Answer* 9. MEDICINE FACTORS: "Have you started any new medicines within the last 2 weeks?" (e.g., antibiotics)      *No Answer* 10. OTHER SYMPTOMS: "Do you have any other symptoms?" (e.g., dizziness, headache, sore throat, joint pain)       *No Answer* 11. PREGNANCY: "Is there any chance you are pregnant?" "When was your last menstrual period?"       *No  Answer*  Protocols used: Rash or Redness - [facility]

## 2022-12-09 NOTE — Discharge Instructions (Signed)

## 2022-12-14 ENCOUNTER — Ambulatory Visit: Payer: Medicare Other | Admitting: Anesthesiology

## 2022-12-14 ENCOUNTER — Encounter
Admission: RE | Disposition: A | Discharge status: Home / Self Care | Payer: Self-pay | Source: Home / Self Care | Attending: Ophthalmology

## 2022-12-14 ENCOUNTER — Encounter: Payer: Self-pay | Admitting: Ophthalmology

## 2022-12-14 ENCOUNTER — Ambulatory Visit
Admission: RE | Admit: 2022-12-14 | Discharge: 2022-12-14 | Disposition: A | Discharge status: Home / Self Care | Payer: Medicare Other | Attending: Ophthalmology | Admitting: Ophthalmology

## 2022-12-14 ENCOUNTER — Other Ambulatory Visit: Payer: Self-pay

## 2022-12-14 DIAGNOSIS — E1165 Type 2 diabetes mellitus with hyperglycemia: Secondary | ICD-10-CM | POA: Diagnosis not present

## 2022-12-14 DIAGNOSIS — H2512 Age-related nuclear cataract, left eye: Secondary | ICD-10-CM | POA: Insufficient documentation

## 2022-12-14 DIAGNOSIS — Z5986 Financial insecurity: Secondary | ICD-10-CM | POA: Insufficient documentation

## 2022-12-14 DIAGNOSIS — E1136 Type 2 diabetes mellitus with diabetic cataract: Secondary | ICD-10-CM | POA: Insufficient documentation

## 2022-12-14 DIAGNOSIS — G4730 Sleep apnea, unspecified: Secondary | ICD-10-CM | POA: Diagnosis not present

## 2022-12-14 DIAGNOSIS — K219 Gastro-esophageal reflux disease without esophagitis: Secondary | ICD-10-CM | POA: Diagnosis not present

## 2022-12-14 DIAGNOSIS — J45909 Unspecified asthma, uncomplicated: Secondary | ICD-10-CM | POA: Insufficient documentation

## 2022-12-14 DIAGNOSIS — Z794 Long term (current) use of insulin: Secondary | ICD-10-CM | POA: Diagnosis not present

## 2022-12-14 DIAGNOSIS — E119 Type 2 diabetes mellitus without complications: Secondary | ICD-10-CM | POA: Insufficient documentation

## 2022-12-14 DIAGNOSIS — I10 Essential (primary) hypertension: Secondary | ICD-10-CM | POA: Diagnosis not present

## 2022-12-14 DIAGNOSIS — Z90710 Acquired absence of both cervix and uterus: Secondary | ICD-10-CM | POA: Diagnosis not present

## 2022-12-14 HISTORY — PX: CATARACT EXTRACTION W/PHACO: SHX586

## 2022-12-14 LAB — GLUCOSE, CAPILLARY: Glucose-Capillary: 84 mg/dL (ref 70–99)

## 2022-12-14 SURGERY — PHACOEMULSIFICATION, CATARACT, WITH IOL INSERTION
Anesthesia: Monitor Anesthesia Care | Site: Eye | Laterality: Left

## 2022-12-14 MED ORDER — SIGHTPATH DOSE#1 BSS IO SOLN
INTRAOCULAR | Status: DC | PRN
  Administered 2022-12-14: 53 mL via OPHTHALMIC

## 2022-12-14 MED ORDER — FENTANYL CITRATE (PF) 100 MCG/2ML IJ SOLN
INTRAMUSCULAR | Status: DC | PRN
  Administered 2022-12-14: 100 ug via INTRAVENOUS

## 2022-12-14 MED ORDER — SIGHTPATH DOSE#1 NA CHONDROIT SULF-NA HYALURON 40-17 MG/ML IO SOLN
INTRAOCULAR | Status: DC | PRN
  Administered 2022-12-14: 1 mL via INTRAOCULAR

## 2022-12-14 MED ORDER — MOXIFLOXACIN HCL 0.5 % OP SOLN
OPHTHALMIC | Status: DC | PRN
  Administered 2022-12-14: .2 mL via OPHTHALMIC

## 2022-12-14 MED ORDER — TETRACAINE HCL 0.5 % OP SOLN
1.0000 [drp] | OPHTHALMIC | Status: DC | PRN
  Administered 2022-12-14 (×3): 1 [drp] via OPHTHALMIC

## 2022-12-14 MED ORDER — ARMC OPHTHALMIC DILATING DROPS
1.0000 | OPHTHALMIC | Status: DC | PRN
  Administered 2022-12-14 (×3): 1 via OPHTHALMIC

## 2022-12-14 MED ORDER — LACTATED RINGERS IV SOLN: INTRAVENOUS | Status: DC

## 2022-12-14 MED ORDER — SIGHTPATH DOSE#1 BSS IO SOLN
INTRAOCULAR | Status: DC | PRN
  Administered 2022-12-14: 1 mL

## 2022-12-14 MED ORDER — MIDAZOLAM HCL 2 MG/2ML IJ SOLN
INTRAMUSCULAR | Status: DC | PRN
  Administered 2022-12-14: 2 mg via INTRAVENOUS

## 2022-12-14 MED ORDER — SIGHTPATH DOSE#1 BSS IO SOLN
INTRAOCULAR | Status: DC | PRN
  Administered 2022-12-14: 15 mL

## 2022-12-14 MED ORDER — BRIMONIDINE TARTRATE-TIMOLOL 0.2-0.5 % OP SOLN
OPHTHALMIC | Status: DC | PRN
  Administered 2022-12-14: 1 [drp] via OPHTHALMIC

## 2022-12-14 SURGICAL SUPPLY — 15 items
CANNULA ANT/CHMB 27G (MISCELLANEOUS) IMPLANT
CANNULA ANT/CHMB 27GA (MISCELLANEOUS) IMPLANT
CATARACT SUITE SIGHTPATH (MISCELLANEOUS) ×1 IMPLANT
FEE CATARACT SUITE SIGHTPATH (MISCELLANEOUS) ×1 IMPLANT
GLOVE BIOGEL PI IND STRL 8 (GLOVE) ×1 IMPLANT
GLOVE SURG ENC TEXT LTX SZ8 (GLOVE) ×1 IMPLANT
LENS IOL TECNIS EYHANCE 23.0 (Intraocular Lens) IMPLANT
NDL FILTER BLUNT 18X1 1/2 (NEEDLE) ×1 IMPLANT
NEEDLE FILTER BLUNT 18X1 1/2 (NEEDLE) ×1 IMPLANT
PACK VIT ANT 23G (MISCELLANEOUS) IMPLANT
RING MALYGIN (MISCELLANEOUS) IMPLANT
SUT ETHILON 10-0 CS-B-6CS-B-6 (SUTURE)
SUTURE EHLN 10-0 CS-B-6CS-B-6 (SUTURE) IMPLANT
SYR 3ML LL SCALE MARK (SYRINGE) ×1 IMPLANT
WATER STERILE IRR 250ML POUR (IV SOLUTION) ×1 IMPLANT

## 2022-12-14 NOTE — Anesthesia Preprocedure Evaluation (Signed)
Anesthesia Evaluation  Patient identified by MRN, date of birth, ID band Patient awake    Reviewed: Allergy & Precautions, NPO status , Patient's Chart, lab work & pertinent test results  History of Anesthesia Complications Negative for: history of anesthetic complications  Airway Mallampati: III  TM Distance: >3 FB Neck ROM: full    Dental no notable dental hx. (+) Teeth Intact   Pulmonary asthma , sleep apnea and Continuous Positive Airway Pressure Ventilation    Pulmonary exam normal        Cardiovascular hypertension, Normal cardiovascular exam+ Valvular Problems/Murmurs      Neuro/Psych negative neurological ROS  negative psych ROS   GI/Hepatic Neg liver ROS,GERD  Medicated,,  Endo/Other  negative endocrine ROSdiabetes, Insulin Dependent    Renal/GU negative Renal ROS  negative genitourinary   Musculoskeletal   Abdominal   Peds  Hematology negative hematology ROS (+)   Anesthesia Other Findings Past Medical History: No date: Asthma     Comment:  mild No date: Diarrhea     Comment:  N/V recently No date: Dyspnea No date: GERD (gastroesophageal reflux disease) No date: Headache No date: Heart murmur     Comment:  developed in last couple years/ no issues No date: HOH (hard of hearing)     Comment:  wears aids No date: Hypertension No date: Motion sickness     Comment:  car No date: Type 2 diabetes mellitus with hyperglycemia ([place])  Past Surgical History: 2006: ABDOMINAL HYSTERECTOMY 2005: BREAST BIOPSY; Left     Comment:  benign 2005?: BREAST EXCISIONAL BIOPSY; Left     Comment:   -benign 08/13/2016: COLONOSCOPY WITH PROPOFOL; N/A     Comment:  Procedure: COLONOSCOPY WITH PROPOFOL;  Surgeon: Lucilla Lame, MD;  Location: [place];  Service:               Endoscopy;  Laterality: N/A; 02/09/2022: COLONOSCOPY WITH PROPOFOL; N/A     Comment:  Procedure: COLONOSCOPY WITH  PROPOFOL;  Surgeon: Lucilla Lame, MD;  Location: [facility] ENDOSCOPY;  Service:               Endoscopy;  Laterality: N/A; 08/13/2016: POLYPECTOMY     Comment:  Procedure: POLYPECTOMY;  Surgeon: Lucilla Lame, MD;                Location: [place];  Service: Endoscopy;;  BMI    Body Mass Index: 31.86 kg/m      Reproductive/Obstetrics negative OB ROS                              Anesthesia Physical Anesthesia Plan  ASA: 3  Anesthesia Plan: General   Post-op Pain Management: Minimal or no pain anticipated   Induction: Intravenous  PONV Risk Score and Plan: 2 and Propofol infusion and TIVA  Airway Management Planned: Natural Airway and Nasal Cannula  Additional Equipment:   Intra-op Plan:   Post-operative Plan:   Informed Consent: I have reviewed the patients History and Physical, chart, labs and discussed the procedure including the risks, benefits and alternatives for the proposed anesthesia with the patient or authorized representative who has indicated his/her understanding and acceptance.     Dental Advisory Given  Plan Discussed with: Anesthesiologist, CRNA and Surgeon  Anesthesia Plan Comments: (Explained  risks of anesthesia, including PONV, and rare emergencies such as cardiac events, respiratory problems, and allergic reactions, requiring invasive intervention. Discussed the role of CRNA in patient's perioperative care. Patient understands. )        Anesthesia Quick Evaluation

## 2022-12-14 NOTE — H&P (Signed)
[facility]   Primary Care Physician:  [name], PA-C Ophthalmologist: Dr. Hortense Ramal  Pre-Procedure History & Physical: HPI:  Shari Lee is a 67 y.o. female here for cataract surgery.   Past Medical History:  Diagnosis Date   Asthma    mild   Diarrhea    N/V recently   Dyspnea    GERD (gastroesophageal reflux disease)    Headache    Heart murmur    developed in last couple years/ no issues   HOH (hard of hearing)    wears aids   Hypertension    Motion sickness    car   Type 2 diabetes mellitus with hyperglycemia [facility])     Past Surgical History:  Procedure Laterality Date   ABDOMINAL HYSTERECTOMY  2006   BREAST BIOPSY Left 2005   benign   BREAST EXCISIONAL BIOPSY Left 2005?    -benign   CATARACT EXTRACTION W/PHACO Right 11/30/2022   Procedure: CATARACT EXTRACTION PHACO AND INTRAOCULAR LENS PLACEMENT ([place]) RIGHT DIABETIC;  Surgeon: Birder Robson, MD;  Location: [place];  Service: Ophthalmology;  Laterality: Right;  4.96 0:37.3   COLONOSCOPY WITH PROPOFOL N/A 08/13/2016   Procedure: COLONOSCOPY WITH PROPOFOL;  Surgeon: Lucilla Lame, MD;  Location: [place];  Service: Endoscopy;  Laterality: N/A;   COLONOSCOPY WITH PROPOFOL N/A 02/09/2022   Procedure: COLONOSCOPY WITH PROPOFOL;  Surgeon: Lucilla Lame, MD;  Location: [facility] ENDOSCOPY;  Service: Endoscopy;  Laterality: N/A;   POLYPECTOMY  08/13/2016   Procedure: POLYPECTOMY;  Surgeon: Lucilla Lame, MD;  Location: [place];  Service: Endoscopy;;    Prior to Admission medications   Medication Sig Start Date End Date Taking? Authorizing Provider  albuterol (VENTOLIN HFA) 108 (90 Base) MCG/ACT inhaler Inhale 2 puffs into the lungs every 6 (six) hours as needed for wheezing or shortness of breath. 07/22/22  Yes Tally Joe T, FNP  amLODipine-benazepril (LOTREL) 5-20 MG capsule Take 1 capsule by mouth daily. 07/08/22  Yes [name], PA-C  atenolol (TENORMIN) 50 MG tablet Take  1 tablet by mouth once daily 11/09/22  Yes Drubel, Ria Comment, PA-C  cetirizine (ZYRTEC) 10 MG tablet Take 1 tablet (10 mg total) by mouth daily. 07/22/22  Yes Gwyneth Sprout, FNP  dapagliflozin propanediol (FARXIGA) 10 MG TABS tablet Take 1 tablet (10 mg total) by mouth daily. Patient receives through [place] Patient [place] 07/09/22  Yes Drubel, Ria Comment, PA-C  fluticasone [facility]) 50 MCG/ACT nasal spray Place 2 sprays into both nostrils daily. 07/22/22  Yes Gwyneth Sprout, FNP  Insulin Degludec ([name]) 100 UNIT/ML SOLN Inject 28 Units into the skin at bedtime.   Yes [provider]  Insulin Glargine Solostar (LANTUS) 100 UNIT/ML Solostar Pen Inject 28 Units into the skin daily. Patient receives through [facility] Patient Assistance through Dec 2023 05/31/22  Yes Drubel, Ria Comment, PA-C  metoCLOPramide (REGLAN) 5 MG tablet Take 1 tablet (5 mg total) by mouth 4 (four) times daily. 11/29/22  Yes Drubel, Ria Comment, PA-C  montelukast (SINGULAIR) 10 MG tablet Take 1 tablet (10 mg total) by mouth at bedtime. 07/22/22  Yes Gwyneth Sprout, FNP  omeprazole (PRILOSEC) 20 MG capsule Take 20 mg by mouth daily.   Yes [provider]  ondansetron (ZOFRAN-ODT) 4 MG disintegrating tablet Take 1 tablet (4 mg total) by mouth every 8 (eight) hours as needed for nausea or vomiting. 11/19/22  Yes [name], PA-C  rosuvastatin (CRESTOR) 5 MG tablet Take 1 tablet by mouth once daily 11/09/22  Yes Drubel, Ria Comment,  PA-C  traZODone (DESYREL) 50 MG tablet Take 0.5 tablets (25 mg total) by mouth at bedtime as needed for sleep. 11/19/22  Yes [name], PA-C  Blood Glucose Monitoring Suppl (CONTOUR NEXT USB MONITOR) w/Device KIT 1 kit by Does not apply route daily. To check blood sugar once daily. 10/03/19   Mar Daring, PA-C  CareTouch Safety Lancets 26G MISC 1 Device by Does not apply route daily. To check blood sugar once daily. 10/03/19   Mar Daring, PA-C  Continuous Blood Gluc Receiver (FREESTYLE  LIBRE 2 READER) DEVI Use to monitor blood sugars continuously as prescribed. 03/30/22   [name], PA-C  Continuous Blood Gluc Sensor (FREESTYLE LIBRE 2 SENSOR) MISC USE TO MONITOR BLOOD SUGAR CONTINUOUSLY AS PRESCRIBED 09/06/22   Drubel, Ria Comment, PA-C  glucose blood (CONTOUR NEXT TEST) test strip To check blood sugar once daily. 11/19/20   Mar Daring, PA-C  Insulin Pen Needle (BD PEN NEEDLE NANO 2ND GEN) 32G X 4 MM MISC To use with Lantus injections. 01/09/21   Mar Daring, PA-C  Insulin Pen Needle (NOVOFINE PLUS) 32G X 4 MM MISC To use with ozempic injections 01/31/19   Mar Daring, PA-C    Allergies as of 11/01/2022 - Review Complete 08/17/2022  Allergen Reaction Noted   Amoxicillin-pot clavulanate  04/29/2015   Levsin  [hyoscyamine]  04/29/2015   Penicillins  04/29/2015   Metformin Diarrhea 07/16/2016   Metformin and related Diarrhea 07/16/2016    Family History  Problem Relation Age of Onset   Heart attack Mother    Diabetes Mother    Heart disease Mother    Heart failure Father    Breast cancer Sister    Sleep apnea Brother    Diverticulitis Brother    Diverticulitis Sister     Social History   Socioeconomic History   Marital status: Divorced    Spouse name: Not on file   Number of children: Not on file   Years of education: Not on file   Highest education level: Not on file  Occupational History   Not on file  Tobacco Use   Smoking status: Never   Smokeless tobacco: Never  Vaping Use   Vaping Use: Never used  Substance and Sexual Activity   Alcohol use: No    Alcohol/week: 0.0 standard drinks of alcohol   Drug use: No   Sexual activity: Not on file  Other Topics Concern   Not on file  Social History Narrative   Not on file   Social Determinants of Health   Financial Resource Strain: High Risk (01/18/2022)   Overall Financial Resource Strain (CARDIA)    Difficulty of Paying Living Expenses: Very hard  Food Insecurity: Not on  file  Transportation Needs: No Transportation Needs (11/24/2020)   PRAPARE - Transportation    Lack of Transportation (Medical): No    Lack of Transportation (Non-Medical): No  Physical Activity: Not on file  Stress: Not on file  Social Connections: Not on file  Intimate Partner Violence: Not on file    Review of Systems: See HPI, otherwise negative ROS  Physical Exam: BP (!) 141/70   Pulse 69   Temp 98.8 F (37.1 C) (Temporal)   Resp 18   Ht '5\' 1"'$  (1.549 m)   Wt 77.8 kg   SpO2 99%   BMI 32.42 kg/m  General:   Alert, cooperative in NAD Head:  Normocephalic and atraumatic. Respiratory:  Normal work of breathing. Cardiovascular:  RRR  Impression/Plan:  Shari Lee is here for cataract surgery.  Risks, benefits, limitations, and alternatives regarding cataract surgery have been reviewed with the patient.  Questions have been answered.  All parties agreeable.   Birder Robson, MD  12/14/2022, 7:48 AM

## 2022-12-14 NOTE — Transfer of Care (Signed)
Immediate Anesthesia Transfer of Care Note  Patient: Shari Lee  Procedure(s) Performed: CATARACT EXTRACTION PHACO AND INTRAOCULAR LENS PLACEMENT (IOC) LEFT DIABETIC (Left: Eye)  Patient Location: PACU  Anesthesia Type: MAC  Level of Consciousness: awake, alert  and patient cooperative  Airway and Oxygen Therapy: Patient Spontanous Breathing and Patient connected to supplemental oxygen  Post-op Assessment: Post-op Vital signs reviewed, Patient's Cardiovascular Status Stable, Respiratory Function Stable, Patent Airway and No signs of Nausea or vomiting  Post-op Vital Signs: Reviewed and stable  Complications: No notable events documented.

## 2022-12-14 NOTE — Anesthesia Postprocedure Evaluation (Signed)
Anesthesia Post Note  Patient: Shari Lee  Procedure(s) Performed: CATARACT EXTRACTION PHACO AND INTRAOCULAR LENS PLACEMENT ([facility]) LEFT DIABETIC (Left: Eye)  Patient location during evaluation: PACU Anesthesia Type: MAC Level of consciousness: awake and alert Pain management: pain level controlled Vital Signs Assessment: post-procedure vital signs reviewed and stable Respiratory status: spontaneous breathing, nonlabored ventilation, respiratory function stable and patient connected to nasal cannula oxygen Cardiovascular status: stable and blood pressure returned to baseline Postop Assessment: no apparent nausea or vomiting Anesthetic complications: no   No notable events documented.   Last Vitals:  Vitals:   12/14/22 0813 12/14/22 0818  BP: 111/70 114/64  Pulse: 69 67  Resp: 16 (!) 22  Temp: (!) 36.4 C (!) 36.4 C  SpO2: 98% 98%    Last Pain:  Vitals:   12/14/22 0818  TempSrc:   PainSc: 0-No pain                 Arita Miss

## 2022-12-14 NOTE — Op Note (Signed)
PREOPERATIVE DIAGNOSIS:  Nuclear sclerotic cataract of the left eye.   POSTOPERATIVE DIAGNOSIS:  Nuclear sclerotic cataract of the left eye.   OPERATIVE PROCEDURE:ORPROCALL@   SURGEON:  Birder Robson, MD.   ANESTHESIA:  Anesthesiologist: Arita Miss, MD CRNA: Tobie Poet, CRNA  1.      Managed anesthesia care. 2.     0.36m of Shugarcaine was instilled following the paracentesis   COMPLICATIONS:  None.   TECHNIQUE:   Stop and chop   DESCRIPTION OF PROCEDURE:  The patient was examined and consented in the preoperative holding area where the aforementioned topical anesthesia was applied to the left eye and then brought back to the Operating Room where the left eye was prepped and draped in the usual sterile ophthalmic fashion and a lid speculum was placed. A paracentesis was created with the side port blade and the anterior chamber was filled with viscoelastic. A near clear corneal incision was performed with the steel keratome. A continuous curvilinear capsulorrhexis was performed with a cystotome followed by the capsulorrhexis forceps. Hydrodissection and hydrodelineation were carried out with BSS on a blunt cannula. The lens was removed in a stop and chop  technique and the remaining cortical material was removed with the irrigation-aspiration handpiece. The capsular bag was inflated with viscoelastic and the Technis [id number] lens was placed in the capsular bag without complication. The remaining viscoelastic was removed from the eye with the irrigation-aspiration handpiece. The wounds were hydrated. The anterior chamber was flushed with BSS and the eye was inflated to physiologic pressure. 0.163mVigamox was placed in the anterior chamber. The wounds were found to be water tight. The eye was dressed with Combigan. The patient was given protective glasses to wear throughout the day and a shield with which to sleep tonight. The patient was also given drops with which to begin a drop regimen  today and will follow-up with me in one day. Implant Name Type Inv. Item Serial No. Manufacturer Lot No. LRB No. Used Action  LENS IOL TECNIS EYHANCE 23.0 - [id number] Lens LENS IOL TECNIS EYHANCE 23.0 [id number]  Left 1 Implanted    Procedure(s) with comments: CATARACT EXTRACTION PHACO AND INTRAOCULAR LENS PLACEMENT ([facility]) LEFT DIABETIC (Left) - 5.98 0:40.6  Electronically signed: WiBirder Robson/27/2024 8:12 AM

## 2022-12-15 ENCOUNTER — Encounter: Payer: Self-pay | Admitting: Ophthalmology

## 2022-12-16 DIAGNOSIS — G4733 Obstructive sleep apnea (adult) (pediatric): Secondary | ICD-10-CM | POA: Diagnosis not present

## 2022-12-17 ENCOUNTER — Encounter: Payer: Self-pay | Admitting: Physician Assistant

## 2022-12-17 ENCOUNTER — Ambulatory Visit (INDEPENDENT_AMBULATORY_CARE_PROVIDER_SITE_OTHER): Payer: Medicare Other | Admitting: Physician Assistant

## 2022-12-17 VITALS — BP 132/67 | HR 61 | Wt 173.0 lb

## 2022-12-17 DIAGNOSIS — E1169 Type 2 diabetes mellitus with other specified complication: Secondary | ICD-10-CM

## 2022-12-17 DIAGNOSIS — E1165 Type 2 diabetes mellitus with hyperglycemia: Secondary | ICD-10-CM | POA: Diagnosis not present

## 2022-12-17 DIAGNOSIS — E785 Hyperlipidemia, unspecified: Secondary | ICD-10-CM | POA: Diagnosis not present

## 2022-12-17 DIAGNOSIS — I350 Nonrheumatic aortic (valve) stenosis: Secondary | ICD-10-CM

## 2022-12-17 DIAGNOSIS — Z794 Long term (current) use of insulin: Secondary | ICD-10-CM | POA: Diagnosis not present

## 2022-12-17 DIAGNOSIS — E1159 Type 2 diabetes mellitus with other circulatory complications: Secondary | ICD-10-CM

## 2022-12-17 DIAGNOSIS — Z0000 Encounter for general adult medical examination without abnormal findings: Secondary | ICD-10-CM

## 2022-12-17 NOTE — Assessment & Plan Note (Signed)
Managed with crestor 5 mg  Repeat fasting lipids LDL goal < 70

## 2022-12-17 NOTE — Progress Notes (Signed)
I,Sha'taria Tyson,acting as a Education administrator for [employer], PA-C.,have documented all relevant documentation on the behalf of [name], PA-C,as directed by  [name], PA-C while in the presence of [name], PA-C.   Annual Wellness Visit     Patient: Shari Lee, Female    DOB: 1956-08-09, 67 y.o.   MRN: [id number] Visit Date: 12/17/2022  Today's Provider: [name], PA-C   No chief complaint on file.  Subjective    Shari Lee is a 66 y.o. female who presents today for her Annual Wellness Visit. She reports consuming a  intermittent  diet. The patient does not participate in regular exercise at present. She generally feels well. She reports sleeping well. She does not have additional problems to discuss today.   HPI Pt has been off of Ozempic 2/2 pancreatitis and has noticed higher sugars in the high 100s and 200s . Denies anything > 300.She would like to try a new diet instead of changing meds-- unicity supplements.  Medications: Outpatient Medications Prior to Visit  Medication Sig   albuterol (VENTOLIN HFA) 108 (90 Base) MCG/ACT inhaler Inhale 2 puffs into the lungs every 6 (six) hours as needed for wheezing or shortness of breath.   amLODipine-benazepril (LOTREL) 5-20 MG capsule Take 1 capsule by mouth daily.   atenolol (TENORMIN) 50 MG tablet Take 1 tablet by mouth once daily   Blood Glucose Monitoring Suppl (CONTOUR NEXT USB MONITOR) w/Device KIT 1 kit by Does not apply route daily. To check blood sugar once daily.   CareTouch Safety Lancets 26G MISC 1 Device by Does not apply route daily. To check blood sugar once daily.   cetirizine (ZYRTEC) 10 MG tablet Take 1 tablet (10 mg total) by mouth daily.   Continuous Blood Gluc Receiver (FREESTYLE LIBRE 2 READER) DEVI Use to monitor blood sugars continuously as prescribed.   Continuous Blood Gluc Sensor (FREESTYLE LIBRE 2 SENSOR) MISC USE TO MONITOR BLOOD SUGAR CONTINUOUSLY AS PRESCRIBED   dapagliflozin  propanediol (FARXIGA) 10 MG TABS tablet Take 1 tablet (10 mg total) by mouth daily. Patient receives through [place] Patient Assistance   fluticasone (FLONASE) 50 MCG/ACT nasal spray Place 2 sprays into both nostrils daily.   glucose blood (CONTOUR NEXT TEST) test strip To check blood sugar once daily.   Insulin Degludec ([name]) 100 UNIT/ML SOLN Inject 28 Units into the skin at bedtime.   Insulin Glargine Solostar (LANTUS) 100 UNIT/ML Solostar Pen Inject 28 Units into the skin daily. Patient receives through [employer] Patient Assistance through Dec 2023   Insulin Pen Needle (BD PEN NEEDLE NANO 2ND GEN) 32G X 4 MM MISC To use with Lantus injections.   Insulin Pen Needle (NOVOFINE PLUS) 32G X 4 MM MISC To use with ozempic injections   metoCLOPramide (REGLAN) 5 MG tablet Take 1 tablet (5 mg total) by mouth 4 (four) times daily.   montelukast (SINGULAIR) 10 MG tablet Take 1 tablet (10 mg total) by mouth at bedtime.   omeprazole (PRILOSEC) 20 MG capsule Take 20 mg by mouth daily.   ondansetron (ZOFRAN-ODT) 4 MG disintegrating tablet Take 1 tablet (4 mg total) by mouth every 8 (eight) hours as needed for nausea or vomiting.   rosuvastatin (CRESTOR) 5 MG tablet Take 1 tablet by mouth once daily   traZODone (DESYREL) 50 MG tablet Take 0.5 tablets (25 mg total) by mouth at bedtime as needed for sleep.   No facility-administered medications prior to visit.    Allergies  Allergen Reactions   Amoxicillin-Pot  Clavulanate Other (See Comments)    Deathly sick per patient   Levsin  [Hyoscyamine]     Other reaction(s): Dizzyness   Ozempic (0.25 Or 0.5 Mg-Dose) [Semaglutide(0.25 Or 0.'5mg'$ -Dos)] Other (See Comments)    pancreatitis   Penicillins    Metformin Diarrhea   Metformin And Related Diarrhea    Patient Care Team: [name], PA-C as PCP - General (Physician Assistant) Kate Sable, MD as PCP - Cardiology (Cardiology) Germaine Pomfret, [facility] (Pharmacist)  Review of Systems      Objective    Vitals: BP 132/67 (BP Location: Right Arm, Patient Position: Sitting, Cuff Size: Large)   Pulse 61   Wt 173 lb (78.5 kg)   SpO2 100%   BMI 32.69 kg/m     Physical Exam Constitutional:      General: She is awake.     Appearance: She is well-developed.  HENT:     Head: Normocephalic.  Eyes:     Conjunctiva/sclera: Conjunctivae normal.  Cardiovascular:     Rate and Rhythm: Normal rate and regular rhythm.     Heart sounds: Murmur heard.  Pulmonary:     Effort: Pulmonary effort is normal.     Breath sounds: Normal breath sounds.  Musculoskeletal:     Right lower leg: No edema.     Left lower leg: No edema.  Skin:    General: Skin is warm.  Neurological:     Mental Status: She is alert and oriented to person, place, and time.  Psychiatric:        Attention and Perception: Attention normal.        Mood and Affect: Mood normal.        Speech: Speech normal.        Behavior: Behavior is cooperative.    Most recent functional status assessment:    12/17/2022    2:23 PM  In your present state of health, do you have any difficulty performing the following activities:  Hearing? 1  Vision? 0  Difficulty concentrating or making decisions? 0  Walking or climbing stairs? 0  Dressing or bathing? 0  Doing errands, shopping? 0   Most recent fall risk assessment:    12/17/2022    2:23 PM  [place] in the past year? 0  Number falls in past yr: 0  Injury with Fall? 0  Risk for fall due to : No Fall Risks  Follow up Falls evaluation completed    Most recent depression screenings:    12/17/2022    2:23 PM 11/29/2022    1:18 PM  PHQ 2/9 Scores  PHQ - 2 Score 0 0  PHQ- 9 Score 3 5   Most recent cognitive screening:     No data to display         Most recent Audit-C alcohol use screening    12/17/2022    2:23 PM  Alcohol Use Disorder Test (AUDIT)  1. How often do you have a drink containing alcohol? 0  2. How many drinks containing alcohol do you  have on a typical day when you are drinking? 0  3. How often do you have six or more drinks on one occasion? 0  AUDIT-C Score 0   A score of 3 or more in women, and 4 or more in men indicates increased risk for alcohol abuse, EXCEPT if all of the points are from question 1   No results found for any visits on 12/17/22.  Assessment &  Plan     Annual wellness visit done today including the all of the following: Reviewed patient's Family Medical History Reviewed and updated list of patient's medical providers Assessment of cognitive impairment was done Assessed patient's functional ability Established a written schedule for health screening [place] Completed and Reviewed  Exercise Activities and Dietary recommendations --balanced diet high in fiber and protein, low in sugars, carbs, fats. --physical activity/exercise 30 minutes 3-5 times a week  -drink at least 64 oz of water daily  Immunization History  Administered Date(s) Administered   Influenza Inj Mdck Quad Pf 07/30/2019   Influenza Inj Mdck Quad With Preservative 08/27/2018   Influenza,inj,Quad PF,6+ Mos 07/28/2015   PNEUMOCOCCAL CONJUGATE-20 05/27/2022   Pneumococcal Conjugate-13 08/27/2018   Td 08/26/2011   Tdap 08/26/2011    Health Maintenance  Topic Date Due   COVID-19 Vaccine (1) Never done   Zoster Vaccines- Shingrix (1 of 2) Never done   DTaP/Tdap/Td (3 - Td or Tdap) 08/25/2021   Diabetic kidney evaluation - Urine ACR  01/06/2023   INFLUENZA VACCINE  01/16/2023 (Originally 05/18/2022)   MAMMOGRAM  04/07/2023   HEMOGLOBIN A1C  05/20/2023   FOOT EXAM  05/28/2023   OPHTHALMOLOGY EXAM  10/29/2023   Diabetic kidney evaluation - eGFR measurement  11/30/2023   Medicare Annual Wellness (AWV)  12/17/2023   DEXA SCAN  04/07/2027   COLONOSCOPY (Pts 45-31yr Insurance coverage will need to be confirmed)  02/09/2029   Pneumonia Vaccine 67 Years old  Completed   Hepatitis C Screening  Completed    HIV Screening  Completed   HPV VACCINES  Aged Out   PAP SMEAR-Modifier  Discontinued     Discussed health benefits of physical activity, and encouraged her to engage in regular exercise appropriate for her age and condition.    Problem List Items Addressed This Visit       Cardiovascular and Mediastinum   Hypertension associated with diabetes ([place]    Chronic, managed with amlodipine 5 mg benazepril 20 mg, atenolol 50 mg  Well controlled Reviewed cmp F/u 6 mo       Aortic stenosis    Needs f/u with cardio for echo next month        Endocrine   Type 2 diabetes mellitus with hyperglycemia (HCC)    Last A1c 6.7% Pt had to stop ozempic since then 2/2 pancreatitis She may have an element of gastroparesis, now takes the metoclopromide sparingly Manages with 28 U insulin bedtime and farxiga 10 mg  Pt wants to try the unicity supplements before starting something new  Advised caution of sugars > 300  On statin, acei Foot exam utd, uacr ordered today, optho utd F/u 3 mo      Relevant Orders   CBC w/Diff/Platelet   Urine Microalbumin w/creat. ratio   Hyperlipidemia associated with type 2 diabetes mellitus ([place]    Managed with crestor 5 mg  Repeat fasting lipids LDL goal < 70      Relevant Orders   Lipid Profile   Comprehensive Metabolic Panel (CMET)   Other Visit Diagnoses     Encounter for annual wellness exam in Medicare patient    -  Primary        Return in about 3 months (around 03/19/2023) for DMII.     I, LMikey Kirschner PA-C have reviewed all documentation for this visit. The documentation on  12/17/22  for the exam, diagnosis, procedures, and orders are all accurate and complete.  LMikey Kirschner  PA-C [id number] [place], [place], [address] Office: [phone] Fax: [place]

## 2022-12-17 NOTE — Assessment & Plan Note (Signed)
Needs f/u with cardio for echo next month

## 2022-12-17 NOTE — Assessment & Plan Note (Signed)
Chronic, managed with amlodipine 5 mg benazepril 20 mg, atenolol 50 mg  Well controlled Reviewed cmp F/u 6 mo

## 2022-12-17 NOTE — Assessment & Plan Note (Addendum)
Last A1c 6.7% Pt had to stop ozempic since then 2/2 pancreatitis She may have an element of gastroparesis, now takes the metoclopromide sparingly Manages with 28 U insulin bedtime and farxiga 10 mg  Pt wants to try the unicity supplements before starting something new  Advised caution of sugars > 300  On statin, acei Foot exam utd, uacr ordered today, optho utd F/u 3 mo

## 2022-12-19 LAB — COMPREHENSIVE METABOLIC PANEL
ALT: 23 IU/L (ref 0–32)
AST: 24 IU/L (ref 0–40)
Albumin/Globulin Ratio: 1.8 (ref 1.2–2.2)
Albumin: 4.4 g/dL (ref 3.9–4.9)
Alkaline Phosphatase: 95 IU/L (ref 44–121)
BUN/Creatinine Ratio: 24 (ref 12–28)
BUN: 20 mg/dL (ref 8–27)
Bilirubin Total: 0.3 mg/dL (ref 0.0–1.2)
CO2: 22 mmol/L (ref 20–29)
Calcium: 9.3 mg/dL (ref 8.7–10.3)
Chloride: 103 mmol/L (ref 96–106)
Creatinine, Ser: 0.82 mg/dL (ref 0.57–1.00)
Globulin, Total: 2.4 g/dL (ref 1.5–4.5)
Glucose: 70 mg/dL (ref 70–99)
Potassium: 4.6 mmol/L (ref 3.5–5.2)
Sodium: 141 mmol/L (ref 134–144)
Total Protein: 6.8 g/dL (ref 6.0–8.5)
eGFR: 79 mL/min/{1.73_m2} (ref >59)

## 2022-12-19 LAB — CBC WITH DIFFERENTIAL/PLATELET
Basophils Absolute: 0.1 10*3/uL (ref 0.0–0.2)
Basos: 1 %
EOS (ABSOLUTE): 0.3 10*3/uL (ref 0.0–0.4)
Eos: 3 %
Hematocrit: 39.5 % (ref 34.0–46.6)
Hemoglobin: 12.8 g/dL (ref 11.1–15.9)
Immature Grans (Abs): 0 10*3/uL (ref 0.0–0.1)
Immature Granulocytes: 0 %
Lymphocytes Absolute: 4.3 10*3/uL — ABNORMAL HIGH (ref 0.7–3.1)
Lymphs: 31 %
MCH: 28.3 pg (ref 26.6–33.0)
MCHC: 32.4 g/dL (ref 31.5–35.7)
MCV: 87 fL (ref 79–97)
Monocytes Absolute: 1 10*3/uL — ABNORMAL HIGH (ref 0.1–0.9)
Monocytes: 7 %
Neutrophils Absolute: 8.1 10*3/uL — ABNORMAL HIGH (ref 1.4–7.0)
Neutrophils: 58 %
Platelets: 427 10*3/uL (ref 150–450)
RBC: 4.52 x10E6/uL (ref 3.77–5.28)
RDW: 13.8 % (ref 11.7–15.4)
WBC: 13.8 10*3/uL — ABNORMAL HIGH (ref 3.4–10.8)

## 2022-12-19 LAB — MICROALBUMIN / CREATININE URINE RATIO
Creatinine, Urine: 104.6 mg/dL
Microalb/Creat Ratio: 17 mg/g creat (ref 0–29)
Microalbumin, Urine: 17.9 ug/mL

## 2022-12-19 LAB — LIPID PANEL
Chol/HDL Ratio: 2.9 ratio (ref 0.0–4.4)
Cholesterol, Total: 186 mg/dL (ref 100–199)
HDL: 65 mg/dL (ref >39)
LDL Chol Calc (NIH): 108 mg/dL — ABNORMAL HIGH (ref 0–99)
Triglycerides: 69 mg/dL (ref 0–149)
VLDL Cholesterol Cal: 13 mg/dL (ref 5–40)

## 2022-12-30 NOTE — Progress Notes (Unsigned)
Patients CPAP order has been pushed back to facility again and requesting the following from Jori Moll ([place])  Seeking follow up notes for Pap Therapy dated on or after 9/31/23. *  ~ Notes must be "Electronically Signed". * **If the PT has not been seen since starting Pap Therapy, please contact this patient to schedule a follow-up appointment regarding their CPAP/BIPAP Therapy. **   Patient had f/u appointment on 11/19/22. Date was entered into parachute and I was given the following error message:  Documentation not supported. The F2F date must be on or before the order date.  You are responsible for ensuring that any patient information submitted by you is accurate and correct. Parachute does not provide recommendations related to the appropriateness of any order or plan of care.  Unable to contact Sharyn Lull because number provided <[phone] does not have extension and when calling it continues to ask for one and then disconnects if not entered.

## 2023-01-03 DIAGNOSIS — Z961 Presence of intraocular lens: Secondary | ICD-10-CM | POA: Diagnosis not present

## 2023-01-16 DIAGNOSIS — G4733 Obstructive sleep apnea (adult) (pediatric): Secondary | ICD-10-CM | POA: Diagnosis not present

## 2023-02-15 DIAGNOSIS — G4733 Obstructive sleep apnea (adult) (pediatric): Secondary | ICD-10-CM | POA: Diagnosis not present

## 2023-02-27 ENCOUNTER — Other Ambulatory Visit: Payer: Self-pay | Admitting: Physician Assistant

## 2023-02-27 DIAGNOSIS — I152 Hypertension secondary to endocrine disorders: Secondary | ICD-10-CM

## 2023-03-15 ENCOUNTER — Telehealth: Payer: Medicare Other

## 2023-03-17 NOTE — Telephone Encounter (Signed)
You  Drubel Nurse4 months ago    Contacted patient to advise she is due for a CPAP f/u appt. Patient verbalized understanding and wanted to know if she could do during her visit on 12/17/22. Advised if she is using the app for her Cpap Machine we can do a virtual visit and I would advise it be sooner to avoid any concerns with insurance. Patient verbalized understanding and states she will schedule when near calendar to look as she was currently walking dog. Advised to send mychart message stating what day of the week works good for her and if she prefers a morning or afternoon appt.  Ok for [facility] to schedule if patient returns call and appointment can be a video visit per Lillia Abed

## 2023-03-18 DIAGNOSIS — G4733 Obstructive sleep apnea (adult) (pediatric): Secondary | ICD-10-CM | POA: Diagnosis not present

## 2023-03-21 ENCOUNTER — Encounter: Payer: Self-pay | Admitting: Physician Assistant

## 2023-03-21 ENCOUNTER — Ambulatory Visit (INDEPENDENT_AMBULATORY_CARE_PROVIDER_SITE_OTHER): Payer: Medicare Other | Admitting: Physician Assistant

## 2023-03-21 VITALS — BP 124/67 | HR 57 | Temp 97.7°F | Resp 13 | Ht 61.0 in | Wt 179.7 lb

## 2023-03-21 DIAGNOSIS — Z794 Long term (current) use of insulin: Secondary | ICD-10-CM | POA: Diagnosis not present

## 2023-03-21 DIAGNOSIS — E1169 Type 2 diabetes mellitus with other specified complication: Secondary | ICD-10-CM | POA: Diagnosis not present

## 2023-03-21 DIAGNOSIS — E1165 Type 2 diabetes mellitus with hyperglycemia: Secondary | ICD-10-CM | POA: Diagnosis not present

## 2023-03-21 DIAGNOSIS — E785 Hyperlipidemia, unspecified: Secondary | ICD-10-CM

## 2023-03-21 NOTE — Assessment & Plan Note (Signed)
Will repeat lipid panel

## 2023-03-21 NOTE — Assessment & Plan Note (Addendum)
Last A1c 6.7% repeat A1c today Pt stopped ozempic 2/2 pancreatitis  Manages with 28 U insulin qhs and farxiga 10 mg  On statin, acei Foot exam, uacr, utd  Long discussion on the mental relationship between mood,feelings, and food. 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 Some cbt techniques were discussed on how to manage

## 2023-03-21 NOTE — Progress Notes (Signed)
I,Shari  Lee,acting as a Neurosurgeon for Shari Kodak, Lee.,have documented all relevant documentation on the behalf of Shari Ferguson, Lee,as directed by  Shari Ferguson, Lee while in the presence of Shari Ferguson, Lee.    Established patient visit   Patient: Shari Lee   DOB: 1956/05/26   67 y.o. Female  MRN: [id number] Visit Date: 03/21/2023  Today's healthcare provider: [name], Lee   Cc. DM f/u  Subjective    HPI   Patient is here today for a follow up, states she has no concerns for today.  Diabetes Mellitus Type II, Follow-up  Lab Results  Component Value Date   HGBA1C 6.7 (H) 11/19/2022   HGBA1C 6.6 (A) 08/17/2022   HGBA1C 7.2 (H) 04/26/2022   Wt Readings from Last 3 Encounters:  03/21/23 179 lb 11.2 oz (81.5 kg)  12/17/22 173 lb (78.5 kg)  12/14/22 171 lb 9.6 oz (77.8 kg)   Last seen for diabetes 3 months ago.  Management since then includes no changes. She reports good compliance with treatment. She is not having side effects.  Pt reports not taking supplements last few weeks and has not been paying attention to diet.   Symptoms: No fatigue No foot ulcerations  No appetite changes No nausea  No paresthesia of the feet  No polydipsia  No polyuria No visual disturbances   No vomiting     Home blood sugar records:  last checked 03/20/2023- 170  Episodes of hypoglycemia?    Current insulin regiment:   Current exercise: none Current diet habits: in general, an "unhealthy" diet  Pertinent Labs: Lab Results  Component Value Date   CHOL 186 12/17/2022   HDL 65 12/17/2022   LDLCALC 108 (H) 12/17/2022   TRIG 69 12/17/2022   CHOLHDL 2.9 12/17/2022   Lab Results  Component Value Date   NA 141 12/17/2022   K 4.6 12/17/2022   CREATININE 0.82 12/17/2022   EGFR 79 12/17/2022   LABMICR 17.9 12/17/2022   MICRALBCREAT 17 12/17/2022      ---------------------------------------------------------------------------------------------------   Medications: Outpatient Medications Prior to Visit  Medication Sig   albuterol (VENTOLIN HFA) 108 (90 Base) MCG/ACT inhaler Inhale 2 puffs into the lungs every 6 (six) hours as needed for wheezing or shortness of breath.   amLODipine-benazepril (LOTREL) 5-20 MG capsule Take 1 capsule by mouth once daily   atenolol (TENORMIN) 50 MG tablet Take 1 tablet by mouth once daily   Blood Glucose Monitoring Suppl (CONTOUR NEXT USB MONITOR) w/Device KIT 1 kit by Does not apply route daily. To check blood sugar once daily.   CareTouch Safety Lancets 26G MISC 1 Device by Does not apply route daily. To check blood sugar once daily.   cetirizine (ZYRTEC) 10 MG tablet Take 1 tablet (10 mg total) by mouth daily.   Continuous Blood Gluc Receiver (FREESTYLE LIBRE 2 READER) DEVI Use to monitor blood sugars continuously as prescribed.   Continuous Blood Gluc Sensor (FREESTYLE LIBRE 2 SENSOR) MISC USE TO MONITOR BLOOD SUGAR CONTINUOUSLY AS PRESCRIBED   dapagliflozin propanediol (FARXIGA) 10 MG TABS tablet Take 1 tablet (10 mg total) by mouth daily. Patient receives through [place] Patient [place]   fluticasone (FLONASE) 50 MCG/ACT nasal spray Place 2 sprays into both nostrils daily.   glucose blood (CONTOUR NEXT TEST) test strip To check blood sugar once daily.   Insulin Degludec ([name]) 100 UNIT/ML SOLN Inject 28 Units into the skin at bedtime.   Insulin Glargine Solostar (LANTUS) 100 UNIT/ML Solostar Pen  Inject 28 Units into the skin daily. Patient receives through [employer] Patient Assistance through Dec 2023   Insulin Pen Needle (BD PEN NEEDLE NANO 2ND GEN) 32G X 4 MM MISC To use with Lantus injections.   Insulin Pen Needle (NOVOFINE PLUS) 32G X 4 MM MISC To use with ozempic injections   metoCLOPramide (REGLAN) 5 MG tablet Take 1 tablet (5 mg total) by mouth 4 (four) times daily.   montelukast (SINGULAIR) 10  MG tablet Take 1 tablet (10 mg total) by mouth at bedtime.   omeprazole (PRILOSEC) 20 MG capsule Take 20 mg by mouth daily.   ondansetron (ZOFRAN-ODT) 4 MG disintegrating tablet Take 1 tablet (4 mg total) by mouth every 8 (eight) hours as needed for nausea or vomiting.   rosuvastatin (CRESTOR) 5 MG tablet Take 1 tablet by mouth once daily   traZODone (DESYREL) 50 MG tablet Take 0.5 tablets (25 mg total) by mouth at bedtime as needed for sleep.   No facility-administered medications prior to visit.   Review of Systems  All other systems reviewed and are negative.     Objective    BP 124/67 (BP Location: Left Arm, Patient Position: Sitting, Cuff Size: Normal)   Pulse (!) 57   Temp 97.7 F (36.5 C) (Oral)   Resp 13   Ht 5\' 1"  (1.549 m)   Wt 179 lb 11.2 oz (81.5 kg)   SpO2 99%   BMI 33.95 kg/m   Physical Exam Vitals reviewed.  Constitutional:      Appearance: She is not ill-appearing.  HENT:     Head: Normocephalic.  Eyes:     Conjunctiva/sclera: Conjunctivae normal.  Cardiovascular:     Rate and Rhythm: Normal rate.  Pulmonary:     Effort: Pulmonary effort is normal. No respiratory distress.  Neurological:     General: No focal deficit present.     Mental Status: She is alert and oriented to person, place, and time.  Psychiatric:        Mood and Affect: Mood normal.        Behavior: Behavior normal.     No results found for any visits on 03/21/23.  Assessment & Plan     Problem List Items Addressed This Visit       Endocrine   Type 2 diabetes mellitus with hyperglycemia (HCC) - Primary    Last A1c 6.7% repeat A1c today Pt stopped ozempic 2/2 pancreatitis  Manages with 28 U insulin qhs and farxiga 10 mg  On statin, acei Foot exam, uacr, utd  Long discussion on the mental relationship between mood,feelings, and food. 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 Some cbt techniques were discussed on how to manage       Relevant Orders   HgB A1c    CBC w/Diff/Platelet   Hyperlipidemia associated with type 2 diabetes mellitus (HCC)    Will repeat lipid panel       Relevant Orders   Lipid Profile   CBC w/Diff/Platelet     Return pending labs, probably 4 mo f./u DM.      I, Shari Ferguson, Lee have reviewed all documentation for this visit. The documentation on  03/21/23   for the exam, diagnosis, procedures, and orders are all accurate and complete.  Shari Ferguson, Lee [facility] [address] [place], [place], [address] Office: [phone] Fax: [phone]   [facility]

## 2023-03-22 ENCOUNTER — Other Ambulatory Visit: Payer: Self-pay | Admitting: Physician Assistant

## 2023-03-22 ENCOUNTER — Telehealth: Payer: Self-pay | Admitting: Physician Assistant

## 2023-03-22 DIAGNOSIS — E1169 Type 2 diabetes mellitus with other specified complication: Secondary | ICD-10-CM

## 2023-03-22 LAB — CBC WITH DIFFERENTIAL/PLATELET
Basophils Absolute: 0.1 10*3/uL (ref 0.0–0.2)
Basos: 1 %
EOS (ABSOLUTE): 0.2 10*3/uL (ref 0.0–0.4)
Eos: 2 %
Hematocrit: 39.8 % (ref 34.0–46.6)
Hemoglobin: 13 g/dL (ref 11.1–15.9)
Immature Grans (Abs): 0 10*3/uL (ref 0.0–0.1)
Immature Granulocytes: 0 %
Lymphocytes Absolute: 3 10*3/uL (ref 0.7–3.1)
Lymphs: 31 %
MCH: 28.3 pg (ref 26.6–33.0)
MCHC: 32.7 g/dL (ref 31.5–35.7)
MCV: 87 fL (ref 79–97)
Monocytes Absolute: 0.9 10*3/uL (ref 0.1–0.9)
Monocytes: 10 %
Neutrophils Absolute: 5.4 10*3/uL (ref 1.4–7.0)
Neutrophils: 56 %
Platelets: 360 10*3/uL (ref 150–450)
RBC: 4.6 x10E6/uL (ref 3.77–5.28)
RDW: 13.4 % (ref 11.7–15.4)
WBC: 9.6 10*3/uL (ref 3.4–10.8)

## 2023-03-22 LAB — HEMOGLOBIN A1C
Est. average glucose Bld gHb Est-mCnc: 177 mg/dL
Hgb A1c MFr Bld: 7.8 % — ABNORMAL HIGH (ref 4.8–5.6)

## 2023-03-22 LAB — LIPID PANEL
Chol/HDL Ratio: 3.8 ratio (ref 0.0–4.4)
Cholesterol, Total: 251 mg/dL — ABNORMAL HIGH (ref 100–199)
HDL: 66 mg/dL (ref >39)
LDL Chol Calc (NIH): 169 mg/dL — ABNORMAL HIGH (ref 0–99)
Triglycerides: 92 mg/dL (ref 0–149)
VLDL Cholesterol Cal: 16 mg/dL (ref 5–40)

## 2023-03-22 MED ORDER — ROSUVASTATIN CALCIUM 20 MG PO TABS: 20.0000 mg | ORAL_TABLET | Freq: Every day | ORAL | 3 refills | Status: DC

## 2023-03-22 NOTE — Telephone Encounter (Signed)
Can we add a tsh/t4 to her labs from yesterday? TEST: [id number]

## 2023-03-23 LAB — T4: T4, Total: 8.6 ug/dL (ref 4.5–12.0)

## 2023-03-23 LAB — SPECIMEN STATUS REPORT

## 2023-03-23 LAB — TSH: TSH: 2.9 u[IU]/mL (ref 0.450–4.500)

## 2023-04-08 ENCOUNTER — Telehealth: Payer: Self-pay | Admitting: Pharmacist

## 2023-04-08 NOTE — Progress Notes (Signed)
Contacted patient regarding upcoming appointment with [facility] pharmacist. Per clinical review, no pharmacist appointment needed at this time. Patient denies any medication needs at this time. Appointment canceled.   She does plan to follow Lillia Abed to [facility] and requests support with patient assistance. Will ensure she is added to our team follow up for 2025 re-enrollment. Discussed that we do have a pharmacist at [facility]. Encouraged patient to call in ~ August to schedule follow up with Lillia Abed.   Catie Eppie Gibson, PharmD, BCACP, CPP Clinical Pharmacist [facility] [phone]

## 2023-05-19 ENCOUNTER — Other Ambulatory Visit: Payer: Self-pay | Admitting: Physician Assistant

## 2023-05-19 DIAGNOSIS — E1165 Type 2 diabetes mellitus with hyperglycemia: Secondary | ICD-10-CM

## 2023-05-25 ENCOUNTER — Other Ambulatory Visit: Payer: Self-pay | Admitting: Physician Assistant

## 2023-05-25 DIAGNOSIS — E1169 Type 2 diabetes mellitus with other specified complication: Secondary | ICD-10-CM

## 2023-05-25 DIAGNOSIS — I10 Essential (primary) hypertension: Secondary | ICD-10-CM

## 2023-05-26 NOTE — Telephone Encounter (Signed)
Unable to refill per protocol, Rx expired. Rosuvastatin discontinued 03/22/23, dose change.  Requested Prescriptions  Pending Prescriptions Disp Refills   atenolol (TENORMIN) 50 MG tablet [Pharmacy Med Name: Atenolol 50 MG Oral Tablet] 90 tablet 0    Sig: Take 1 tablet by mouth once daily     Cardiovascular: Beta Blockers 2 Passed - 05/25/2023  6:50 AM      Passed - Cr in normal range and within 360 days    Creatinine, Ser  Date Value Ref Range Status  12/17/2022 0.82 0.57 - 1.00 mg/dL Final         Passed - Last BP in normal range    BP Readings from Last 1 Encounters:  03/21/23 124/67         Passed - Last Heart Rate in normal range    Pulse Readings from Last 1 Encounters:  03/21/23 (!) 57         Passed - Valid encounter within last 6 months    Recent Outpatient Visits           2 months ago Type 2 diabetes mellitus with hyperglycemia, with long-term current use of insulin (HCC)   [facility] [place], [place], PA-C   5 months ago Encounter for annual wellness exam in Medicare patient   [facility] [place], [place], PA-C   5 months ago Rash   [facility] Brice, [place], PA-C   5 months ago Vomiting and diarrhea   [facility] Edwards, [place], PA-C   6 months ago Vomiting and diarrhea   [facility], Lillia Abed, PA-C               rosuvastatin (CRESTOR) 5 MG tablet [Pharmacy Med Name: Rosuvastatin Calcium 5 MG Oral Tablet] 90 tablet 0    Sig: Take 1 tablet by mouth once daily     Cardiovascular:  Antilipid - Statins 2 Failed - 05/25/2023  6:50 AM      Failed - Lipid Panel in normal range within the last 12 months    Cholesterol, Total  Date Value Ref Range Status  03/21/2023 251 (H) 100 - 199 mg/dL Final   LDL Chol Calc (NIH)  Date Value Ref Range Status  03/21/2023 169 (H) 0 - 99 mg/dL Final   HDL  Date Value Ref Range Status   03/21/2023 66 >39 mg/dL Final   Triglycerides  Date Value Ref Range Status  03/21/2023 92 0 - 149 mg/dL Final         Passed - Cr in normal range and within 360 days    Creatinine, Ser  Date Value Ref Range Status  12/17/2022 0.82 0.57 - 1.00 mg/dL Final         Passed - Patient is not pregnant      Passed - Valid encounter within last 12 months    Recent Outpatient Visits           2 months ago Type 2 diabetes mellitus with hyperglycemia, with long-term current use of insulin (HCC)    [facility] [name], PA-C   5 months ago Encounter for annual wellness exam in Medicare patient   [facility] Edwards, [place], PA-C   5 months ago Rash   [facility] [place], [place], PA-C   5 months ago Vomiting and diarrhea   [facility] [name], [place]  6 months ago Vomiting and diarrhea   [facility] [name], [place]

## 2023-07-05 ENCOUNTER — Ambulatory Visit (HOSPITAL_BASED_OUTPATIENT_CLINIC_OR_DEPARTMENT_OTHER)
Admission: RE | Admit: 2023-07-05 | Discharge: 2023-07-05 | Disposition: A | Discharge status: Home / Self Care | Payer: Medicare Other | Source: Ambulatory Visit | Attending: Physician Assistant | Admitting: Physician Assistant

## 2023-07-05 ENCOUNTER — Ambulatory Visit (INDEPENDENT_AMBULATORY_CARE_PROVIDER_SITE_OTHER): Payer: Medicare Other | Admitting: Physician Assistant

## 2023-07-05 ENCOUNTER — Encounter: Payer: Self-pay | Admitting: Physician Assistant

## 2023-07-05 ENCOUNTER — Telehealth: Payer: Self-pay

## 2023-07-05 VITALS — BP 136/74 | HR 68 | Temp 98.2°F | Ht 61.0 in | Wt 196.8 lb

## 2023-07-05 DIAGNOSIS — Z794 Long term (current) use of insulin: Secondary | ICD-10-CM

## 2023-07-05 DIAGNOSIS — E1165 Type 2 diabetes mellitus with hyperglycemia: Secondary | ICD-10-CM

## 2023-07-05 DIAGNOSIS — M79672 Pain in left foot: Secondary | ICD-10-CM | POA: Diagnosis not present

## 2023-07-05 DIAGNOSIS — M25572 Pain in left ankle and joints of left foot: Secondary | ICD-10-CM

## 2023-07-05 MED ORDER — INSULIN GLARGINE SOLOSTAR 100 UNIT/ML ~~LOC~~ SOPN: PEN_INJECTOR | SUBCUTANEOUS | Status: DC

## 2023-07-05 MED ORDER — CELECOXIB 100 MG PO CAPS: 100.0000 mg | ORAL_CAPSULE | Freq: Two times a day (BID) | ORAL | 0 refills | Status: DC

## 2023-07-05 NOTE — Progress Notes (Signed)
Care Guide Note  07/05/2023 Name: [name] MRN: [id number] DOB: 10-12-1956  Referred by: [name], PA-C Reason for referral : Care Coordination (Outreach to schedule with pharm d )   Shari Lee is a 67 y.o. year old female who is a primary care patient of [name], [place]. Shari Lee was referred to the pharmacist for assistance related to DM.    Successful contact was made with the patient to discuss pharmacy services including being ready for the pharmacist to call at least 5 minutes before the scheduled appointment time, to have medication bottles and any blood sugar or blood pressure readings ready for review. The patient agreed to meet with the pharmacist via with the pharmacist via telephone visit on (date/time).  07/08/2023  Shari Lee, RMA Care Guide Shari Lee  [place], [place] [address] Direct Dial: [phone] [email or web address]

## 2023-07-05 NOTE — Progress Notes (Signed)
Established patient visit   Patient: Shari Lee   DOB: 02/01/1956   67 y.o. Female  MRN: [id number] Visit Date: 07/05/2023  Today's healthcare provider: [name], PA-C   Chief Complaint  Patient presents with   Generalized Body Aches    All over body aches for 6 weeks and increased pain in right hand that started last Thursday.    Subjective    HPI Pt was originally my patient at [facility], this is the first time she is being seen at [facility].   She reports slipping on a chair 6-8 weeks ago, feeling pain in her left heel when walking since then. No improvement with time, tylenol. Reports left knee pain and hip pain now.   Reports overall joint aches, right wrist pain in particular. She admits her sugars have been uncontrolled. When she does check her BS, it ranges 350+ -200s. She is currently doing 34 U nightly.   Medications: Outpatient Medications Prior to Visit  Medication Sig   albuterol (VENTOLIN HFA) 108 (90 Base) MCG/ACT inhaler Inhale 2 puffs into the lungs every 6 (six) hours as needed for wheezing or shortness of breath.   amLODipine-benazepril (LOTREL) 5-20 MG capsule Take 1 capsule by mouth once daily   atenolol (TENORMIN) 50 MG tablet Take 1 tablet by mouth once daily   Blood Glucose Monitoring Suppl (CONTOUR NEXT USB MONITOR) w/Device KIT 1 kit by Does not apply route daily. To check blood sugar once daily.   CareTouch Safety Lancets 26G MISC 1 Device by Does not apply route daily. To check blood sugar once daily.   cetirizine (ZYRTEC) 10 MG tablet Take 1 tablet (10 mg total) by mouth daily.   Continuous Blood Gluc Receiver (FREESTYLE LIBRE 2 READER) DEVI Use to monitor blood sugars continuously as prescribed.   Continuous Glucose Sensor (FREESTYLE LIBRE 2 SENSOR) MISC USE TO MONITOR BLOOD SUGAR CONTINUOUSLY AS DIRECTED   dapagliflozin propanediol (FARXIGA) 10 MG TABS tablet Take 1 tablet (10 mg total) by mouth daily.  Patient receives through [place] Patient Assistance   fluticasone (FLONASE) 50 MCG/ACT nasal spray Place 2 sprays into both nostrils daily.   glucose blood (CONTOUR NEXT TEST) test strip To check blood sugar once daily.   Insulin Pen Needle (BD PEN NEEDLE NANO 2ND GEN) 32G X 4 MM MISC To use with Lantus injections.   Insulin Pen Needle (NOVOFINE PLUS) 32G X 4 MM MISC To use with ozempic injections   montelukast (SINGULAIR) 10 MG tablet Take 1 tablet (10 mg total) by mouth at bedtime.   omeprazole (PRILOSEC) 20 MG capsule Take 20 mg by mouth daily.   ondansetron (ZOFRAN-ODT) 4 MG disintegrating tablet Take 1 tablet (4 mg total) by mouth every 8 (eight) hours as needed for nausea or vomiting.   rosuvastatin (CRESTOR) 20 MG tablet Take 1 tablet (20 mg total) by mouth daily.   traZODone (DESYREL) 50 MG tablet Take 0.5 tablets (25 mg total) by mouth at bedtime as needed for sleep.   [DISCONTINUED] Insulin Degludec ([name]) 100 UNIT/ML SOLN Inject 28 Units into the skin at bedtime.   [DISCONTINUED] Insulin Glargine Solostar (LANTUS) 100 UNIT/ML Solostar Pen Inject 28 Units into the skin daily. Patient receives through [employer] Patient Assistance through Dec 2023   metoCLOPramide (REGLAN) 5 MG tablet Take 1 tablet (5 mg total) by mouth 4 (four) times daily. (Patient not taking: Reported on 07/05/2023)   No facility-administered medications prior to visit.    Review  of Systems  Constitutional:  Negative for fatigue and fever.  Respiratory:  Negative for cough and shortness of breath.   Cardiovascular:  Negative for chest pain and leg swelling.  Gastrointestinal:  Negative for abdominal pain.  Musculoskeletal:  Positive for arthralgias and myalgias.  Neurological:  Negative for dizziness and headaches.      Objective    BP 136/74 (BP Location: Left Arm, Patient Position: Sitting, Cuff Size: Normal)   Pulse 68   Temp 98.2 F (36.8 C) (Oral)   Ht 5\' 1"  (1.549 m)   Wt 196 lb 12.8 oz (89.3 kg)    SpO2 98%   BMI 37.19 kg/m    Physical Exam Vitals reviewed.  Constitutional:      Appearance: She is not ill-appearing.  HENT:     Head: Normocephalic.  Eyes:     Conjunctiva/sclera: Conjunctivae normal.  Cardiovascular:     Rate and Rhythm: Normal rate.  Pulmonary:     Effort: Pulmonary effort is normal. No respiratory distress.  Musculoskeletal:     Comments: Left ankle without instability, no achilles tenderness, no ecchymosis, edema  Neurological:     General: No focal deficit present.     Mental Status: She is alert and oriented to person, place, and time.  Psychiatric:        Mood and Affect: Mood normal.        Behavior: Behavior normal.      No results found for any visits on 07/05/23.  Assessment & Plan     1. Acute left ankle pain Rx celebrex bid  Recommending xrays r/o fx - celecoxib (CELEBREX) 100 MG capsule; Take 1 capsule (100 mg total) by mouth 2 (two) times daily.  Dispense: 60 capsule; Refill: 0 - DG Ankle Complete Left; Future - DG Foot Complete Left; Future  2. Type 2 diabetes mellitus with hyperglycemia, with long-term current use of insulin (HCC) Advised pt to titrate by 2 U q 2-3 days from 34 U to 40 U . Strongly encouraged to cut carbs, sugars, sodas. She was controlled earlier this year, but reports not eating well and not staying accountable.   Referring to pharmacy as she received these benefits at her old office.  - AMB Referral to Pharmacy Medication Management - Insulin Glargine Solostar (LANTUS) 100 UNIT/ML Solostar Pen; Patient advised to titrate from 34 U up to 40 U by 2 U every 2-3 days   Return in about 3 months (around 10/04/2023), or if symptoms worsen or fail to improve, for DMII.      I, [name], PA-C have reviewed all documentation for this visit. The documentation on  07/05/23   for the exam, diagnosis, procedures, and orders are all accurate and complete.    [name], PA-C  [facility] at  [facility] [phone] (phone) [phone] (fax)  [facility]

## 2023-07-08 ENCOUNTER — Encounter: Payer: Self-pay | Admitting: Pharmacist

## 2023-07-08 ENCOUNTER — Other Ambulatory Visit: Payer: Medicare Other | Admitting: Pharmacist

## 2023-07-08 NOTE — Progress Notes (Signed)
07/08/2023 Name: Shari Lee MRN: [id number] DOB: 1956/05/11  Chief Complaint  Patient presents with   Diabetes   Medication Management    Shari Lee is a 67 y.o. year old female who presented for a telephone visit.   They were referred to the pharmacist by their PCP for assistance in managing diabetes, medication access, and complex medication management.    Subjective:   Medication Access/Adherence  Current Pharmacy:  [facility] [address] ([address]), [place] - [address] [place]) [place] [address] Phone: [phone] Fax: [phone]  [facility] - [place], [place] - [address] [place] [address] Phone: [phone] Fax: [phone]   Patient reports affordability concerns with their medications: No  - She is getting [name], pen needles and Farxiga from medication assistance program  Patient reports access/transportation concerns to their pharmacy: No  Patient reports adherence concerns with their medications:  No      Diabetes:  Current medications:  Farxiga 10mg  daily - [place] [name] 36 units daily (plan is to increase by 2 units every 2 to 3 days). Getting from [employer] medication assistance program along with pen needles Med list has Lantus on her list but per patient is she taking [name] (she spelled name of insulin).   Medications tried in the past: Ozempic - stopped due to pancreatitis; metformin - worsened IBS related diarrhea   Date of Download: 07/08/2023 % Time CGM is active: 19% Average Glucose: 265 mg/dL Glucose Management Indicator: not enough data to determine  Glucose Variability: 26.6% (goal <36%) Time in Goal:  - Time in range 70-180: 6% - Time above range: 94% - Time below range: 0% Observed patterns: blood glucose is high all day. Patient has Josephine Igo 2 and due to infrequent scans some data is missing. She also had sensor fall off so no  readings from 9/14 to mid-day 09/17.     Patient denies hypoglycemic s/sx including no dizziness, shakiness, sweating.  Patient reports hyperglycemic symptoms including polydipsia, nocturia (1 to 3 times per night), blurred vision (occasionally - had cataract surgery this year and not sure if vision changes are related to surgery or blood glucose - seeing ophthalmology 07/14/2023)   Current meal patterns:  - Breakfast: hash brown, ham biscuit, honey bun or donut (11 or 12 pm)  - Lunch: usually nothing - Supper: eat from 5 to 6:30 pm - eats out [employer] - roast beef, mashed potatoes, macaroni and cheese - Snacks: sometimes snacks on cashews, cheese bites - Drinks: water, sometimes half and half tea.  Doesn't cook much, single   Hypertension:  Current medications: atenolol 50mg  daily and amlodipine - benazepril 5-20mg  once a day  BP Readings from Last 3 Encounters:  07/05/23 136/74  03/21/23 124/67  12/17/22 132/67    Hyperlipidemia/ASCVD Risk Reduction  Current lipid lowering medications: rosuvastatin 20mg  daily     Objective:  Lab Results  Component Value Date   HGBA1C 7.8 (H) 03/21/2023    Lab Results  Component Value Date   CREATININE 0.82 12/17/2022   BUN 20 12/17/2022   NA 141 12/17/2022   K 4.6 12/17/2022   CL 103 12/17/2022   CO2 22 12/17/2022    Lab Results  Component Value Date   CHOL 251 (H) 03/21/2023   HDL 66 03/21/2023   LDLCALC 169 (H) 03/21/2023   TRIG 92 03/21/2023   CHOLHDL 3.8 03/21/2023  Medications Reviewed Today     Reviewed by Henrene Pastor, RPH-CPP (Pharmacist) on 07/08/23 at 1200  Med List Status: <None>   Medication Order Taking? Sig Documenting Provider Last Dose Status Informant  albuterol (VENTOLIN HFA) 108 (90 Base) MCG/ACT inhaler [id number]  Inhale 2 puffs into the lungs every 6 (six) hours as needed for wheezing or shortness of breath. Merita Norton T, FNP  Active   amLODipine-benazepril (LOTREL) 5-20 MG capsule  [id number] Yes Take 1 capsule by mouth once daily Drubel, Lillia Abed, PA-C Taking Active   atenolol (TENORMIN) 50 MG tablet [id number] Yes Take 1 tablet by mouth once daily Pardue, Sarah N, DO Taking Active   Blood Glucose Monitoring Suppl (CONTOUR NEXT USB MONITOR) w/Device KIT [id number] No 1 kit by Does not apply route daily. To check blood sugar once daily.  Patient not taking: Reported on 07/08/2023   Reine Just Not Taking Active   CareTouch Safety Lancets 26G MISC [id number]  1 Device by Does not apply route daily. To check blood sugar once daily. [name] M, PA-C  Active   celecoxib (CELEBREX) 100 MG capsule [id number]  Take 1 capsule (100 mg total) by mouth 2 (two) times daily. [name], PA-C  Active   cetirizine (ZYRTEC) 10 MG tablet [id number]  Take 1 tablet (10 mg total) by mouth daily. Jacky Kindle, FNP  Active   Continuous Blood Gluc Receiver (FREESTYLE LIBRE 2 READER) DEVI [id number] No Use to monitor blood sugars continuously as prescribed.  Patient not taking: Reported on 07/08/2023   [name], PA-C Not Taking Active   Continuous Glucose Sensor (FREESTYLE LIBRE 2 SENSOR) [place] [id number] Yes USE TO MONITOR BLOOD SUGAR CONTINUOUSLY AS DIRECTED Payton Mccallum Monico Blitz, DO Taking Active   dapagliflozin propanediol (FARXIGA) 10 MG TABS tablet [id number] Yes Take 1 tablet (10 mg total) by mouth daily. Patient receives through [place] Patient Assistance [name], PA-C Taking Active            Med Note Clydie Braun, Vernesha Talbot B   Fri Jul 08, 2023 12:00 PM) [place] medication [place] thru 10/18/2023  fluticasone [facility]) 50 MCG/ACT nasal spray [id number] Yes Place 2 sprays into both nostrils daily. Jacky Kindle, FNP Taking Active   glucose blood (CONTOUR NEXT TEST) test strip [id number] No To check blood sugar once daily.  Patient not taking: Reported on 07/08/2023   Margaretann Loveless, PA-C Not Taking Active   insulin degludec ([name] FLEXTOUCH) 100 UNIT/ML  FlexTouch Pen [id number] Yes Inject 34-40 Units into the skin daily. [provider] Taking Active            Med Note Clydie Braun, Kayvion Arneson B   Fri Jul 08, 2023 11:15 AM) Gets from [employer] MAP  Insulin Pen Needle (BD PEN NEEDLE NANO 2ND GEN) 32G X 4 MM MISC [id number]  To use with Lantus injections. [name] M, PA-C  Active   Insulin Pen Needle (NOVOFINE PLUS) 32G X 4 MM MISC [id number] Yes To use with ozempic injections Margaretann Loveless, PA-C Taking Active   metoCLOPramide (REGLAN) 5 MG tablet [id number] No Take 1 tablet (5 mg total) by mouth 4 (four) times daily.  Patient not taking: Reported on 07/05/2023   [name], PA-C Not Taking Active   montelukast (SINGULAIR) 10 MG tablet [id number]  Take 1 tablet (10 mg total) by mouth at bedtime. Jacky Kindle, FNP  Active Self  omeprazole (PRILOSEC) 20 MG capsule [id number]  Take 20 mg by mouth daily. [provider]  Active Self  ondansetron (ZOFRAN-ODT) 4 MG disintegrating tablet [id number] No Take 1 tablet (4 mg total) by mouth every 8 (eight) hours as needed for nausea or vomiting.  Patient not taking: Reported on 07/08/2023   [name], PA-C Not Taking Active   rosuvastatin (CRESTOR) 20 MG tablet [id number] Yes Take 1 tablet (20 mg total) by mouth daily. [name], PA-C Taking Active   traZODone (DESYREL) 50 MG tablet [id number] Yes Take 0.5 tablets (25 mg total) by mouth at bedtime as needed for sleep. [name], PA-C Taking Active               Assessment/Plan:   Diabetes: blood glucose and A1c not at goal - Reviewed long term cardiovascular and renal outcomes of uncontrolled blood sugar - Reviewed goal A1c, goal fasting, and goal 2 hour post prandial glucose - Reviewed dietary modifications including: Increase non-starchy vegetables - carrots, green bean, squash, zucchini, tomatoes, onions, peppers, spinach and other green leafy vegetables, cabbage, lettuce, cucumbers, asparagus, okra  (not fried), eggplant Limit sugar and processed foods (cakes, cookies, ice cream, crackers and chips) Increase fresh fruit but limit serving sizes 1/2 cup or about the size of tennis or baseball Limit red meat to no more than 1-2 times per week (serving size about the size of your palm) Choose whole grains / lean proteins - whole wheat bread, quinoa, whole grain rice (1/2 cup), fish, chicken, [place] Avoid sugar and calorie containing beverages - soda, sweet tea and juice.  Choose water or unsweetened tea instead.  - Recommend to continue to increase [name] by 2 units every 2 days until FBG is < 150. Continue Farxiga 10mg  daily.  - Recommend to check glucose by scanning Libre 2 sensor at least 4 times a day - with meals and at bedtime.  Patient has 2 of the [place] 2 sensors left. Will plan to change to [facility] 3 sensors and app so there is less likely to have data lags - Will monitor for medication assistance program need for 2025 and reapply for [place].    Hypertension:Currently controlled - Reviewed long term cardiovascular and renal outcomes of uncontrolled blood pressure - Recommend to atenolol and amlodipine-benazepril  Hyperlipidemia/ASCVD Risk Reduction: Last LDL was not at goal.  - Reviewed long term complications of uncontrolled cholesterol - Reviewed dietary recommendations including limiting intake of saturated fat - Recommend to take rosuvastatin 20mg  daily. If next LDL not < 100 then consider increasing rosuvastatin to 40mg  daily, add ezetimibe or add PCSK9 (could check Lpa - if high would lean toward starting PCSK9)  Follow Up Plan: next week to review Continuous Glucose Monitor.   Henrene Pastor, PharmD Clinical Pharmacist [facility]

## 2023-07-12 ENCOUNTER — Encounter: Payer: Self-pay | Admitting: Physician Assistant

## 2023-07-14 ENCOUNTER — Other Ambulatory Visit: Payer: Medicare Other | Admitting: Pharmacist

## 2023-07-14 DIAGNOSIS — H26493 Other secondary cataract, bilateral: Secondary | ICD-10-CM | POA: Diagnosis not present

## 2023-07-14 DIAGNOSIS — M3501 Sicca syndrome with keratoconjunctivitis: Secondary | ICD-10-CM | POA: Diagnosis not present

## 2023-07-14 DIAGNOSIS — H02889 Meibomian gland dysfunction of unspecified eye, unspecified eyelid: Secondary | ICD-10-CM | POA: Diagnosis not present

## 2023-07-14 DIAGNOSIS — E119 Type 2 diabetes mellitus without complications: Secondary | ICD-10-CM | POA: Diagnosis not present

## 2023-07-15 ENCOUNTER — Other Ambulatory Visit: Payer: Medicare Other | Admitting: Pharmacist

## 2023-07-15 NOTE — Telephone Encounter (Signed)
Resulted xrays

## 2023-07-15 NOTE — Progress Notes (Signed)
07/15/2023 Name: Shari Lee MRN: [id number] DOB: [birth year]  Chief Complaint  Patient presents with   Diabetes    Follow up     Shari Lee is a 67 y.o. year old female who presented for a telephone visit.   They were referred to the pharmacist by their PCP for assistance in managing diabetes, medication access, and complex medication management.    Subjective:   Medication Access/Adherence  Current Pharmacy:  [facility] [address] ([address]), [place] - [address] [place]) [place] [address] Phone: [phone] Fax: [phone]  [facility] - [place], [place] - [address] [place] [address] Phone: [phone] Fax: [phone]   Patient reports affordability concerns with their medications: No  - She is getting [name], pen needles and Farxiga from medication assistance program  Patient reports access/transportation concerns to their pharmacy: No  Patient reports adherence concerns with their medications:  No      Diabetes:  Current medications:  Farxiga 10mg  daily - getting from [place] [name] 40 units daily (plan is to continue to increase by 2 units every 2 to 3 days). Getting from [employer] medication assistance program along with pen needles  Medications tried in the past: Ozempic - stopped due to pancreatitis; metformin - worsened IBS related diarrhea   Date of Download: 07/15/2023 % Time CGM is active: 29% (previously 19%) Average Glucose: 268 ml/dL (previously 951 mg/dL) Glucose Management Indicator: not enough data to determine  Glucose Variability: 26.6% (goal <36%) (previously was 26.6%) Time in Goal:  - Time in range 70-180: 3% (previously 6%) - Time above range: 97% ( previously 94%) - Time below range: 0% Observed patterns:   Data for last 3 days shows improved blood glucose. Per patient blood glucose was 190 this morning.  Times of missing data mostly during  daytime. Patient has Josephine Igo 2 sensors but we are planning to change to [place] 3 in future which will eliminate missing blood glucose data.   Per patient she scans sensor around 6am, afternoon. Has been forgetting to check before bedtime. Which according to her report below is opposite of what Continuous Glucose Monitor report is showing. Suspect she might have incorrect time zone selected for app.       Patient denies hypoglycemic s/sx including no dizziness, shakiness, sweating.  Patient reports hyperglycemic symptoms including polydipsia, nocturia (1 to 3 times per night), blurred vision (occasionally - had cataract surgery this year and not sure if vision changes are related to surgery or blood glucose - following with ophthalmology)   Current meal patterns:  - Breakfast (around 11a to 12p): hash brown, ham biscuit. She has started to not get honey bun or donut  that she was eating daily at breakfast  - Lunch: usually nothing - Supper: eat from 5 to 6:30 pm - eats out [employer] - roast beef, mashed potatoes, macaroni and cheese. Since our last visit she is trying to include fewer high CHO foods and more low CHO vegetables.  - Snacks: sometimes snacks on cashews, cheese bites - Drinks: water, sometimes half and half tea.  Doesn't cook much, single   Hypertension:  Current medications: atenolol 50mg  daily and amlodipine - benazepril 5-20mg  once a day  BP Readings from Last 3 Encounters:  07/05/23 136/74  03/21/23 124/67  12/17/22 132/67    Hyperlipidemia/ASCVD Risk Reduction  Current lipid lowering medications: rosuvastatin 20mg  daily  Objective:  Lab Results  Component Value Date   HGBA1C 7.8 (H) 03/21/2023    Lab Results  Component Value Date   CREATININE 0.82 12/17/2022   BUN 20 12/17/2022   NA 141 12/17/2022   K 4.6 12/17/2022   CL 103 12/17/2022   CO2 22 12/17/2022    Lab Results  Component Value Date   CHOL 251 (H) 03/21/2023   HDL 66 03/21/2023    LDLCALC 169 (H) 03/21/2023   TRIG 92 03/21/2023   CHOLHDL 3.8 03/21/2023    Medications Reviewed Today     Reviewed by Henrene Pastor, RPH-CPP (Pharmacist) on 07/15/23 at 1054  Med List Status: <None>   Medication Order Taking? Sig Documenting Provider Last Dose Status Informant  albuterol (VENTOLIN HFA) 108 (90 Base) MCG/ACT inhaler [id number]  Inhale 2 puffs into the lungs every 6 (six) hours as needed for wheezing or shortness of breath. Merita Norton T, FNP  Active   amLODipine-benazepril (LOTREL) 5-20 MG capsule [id number] Yes Take 1 capsule by mouth once daily Drubel, Lillia Abed, PA-C Taking Active   atenolol (TENORMIN) 50 MG tablet [id number] Yes Take 1 tablet by mouth once daily Pardue, Sarah N, DO Taking Active   Blood Glucose Monitoring Suppl (CONTOUR NEXT USB MONITOR) w/Device KIT [id number] No 1 kit by Does not apply route daily. To check blood sugar once daily.  Patient not taking: Reported on 07/15/2023   Reine Just Not Taking Active   CareTouch Safety Lancets 26G MISC [id number] No 1 Device by Does not apply route daily. To check blood sugar once daily.  Patient not taking: Reported on 07/15/2023   Margaretann Loveless, PA-C Not Taking Active   celecoxib (CELEBREX) 100 MG capsule [id number] Yes Take 1 capsule (100 mg total) by mouth 2 (two) times daily. [name], PA-C Taking Active   cetirizine (ZYRTEC) 10 MG tablet [id number] Yes Take 1 tablet (10 mg total) by mouth daily. Jacky Kindle, FNP Taking Active   Continuous Blood Gluc Receiver (FREESTYLE LIBRE 2 READER) [place] [id number] No Use to monitor blood sugars continuously as prescribed.  Patient not taking: Reported on 07/15/2023   [name], PA-C Not Taking Active   Continuous Glucose Sensor (FREESTYLE LIBRE 2 SENSOR) [place] [id number] Yes USE TO MONITOR BLOOD SUGAR CONTINUOUSLY AS DIRECTED Payton Mccallum Monico Blitz, DO Taking Active   dapagliflozin propanediol (FARXIGA) 10 MG TABS tablet [id number] Yes Take 1 tablet (10  mg total) by mouth daily. Patient receives through [employer] Patient Assistance [name], PA-C Taking Active            Med Note Clydie Braun, Anvay Tennis B   Fri Jul 08, 2023 12:00 PM) [place] [employer] [place] medication assistance program thru 10/18/2023  fluticasone [facility]) 50 MCG/ACT nasal spray [id number] Yes Place 2 sprays into both nostrils daily. Jacky Kindle, FNP Taking Active   glucose blood (CONTOUR NEXT TEST) test strip [id number] Yes To check blood sugar once daily. [name] M, PA-C Taking Active   insulin degludec ([name] FLEXTOUCH) 100 UNIT/ML FlexTouch Pen [id number] Yes Inject 40-48 Units into the skin daily. [provider] Taking Active            Med Note Clydie Braun, Joby Hershkowitz B   Fri Jul 08, 2023 11:15 AM) Gets from [employer] MAP  Insulin Pen Needle (BD PEN NEEDLE NANO 2ND GEN) 32G X 4 MM MISC [id number] Yes To use with Lantus injections. Margaretann Loveless, PA-C Taking Active   Insulin Pen Needle (NOVOFINE PLUS) 32G X 4  MM MISC [id number] Yes To use with ozempic injections Margaretann Loveless, PA-C Taking Active   metoCLOPramide (REGLAN) 5 MG tablet [id number]  Take 1 tablet (5 mg total) by mouth 4 (four) times daily. [name], PA-C  Active   montelukast (SINGULAIR) 10 MG tablet [id number]  Take 1 tablet (10 mg total) by mouth at bedtime. Jacky Kindle, FNP  Active Self  omeprazole (PRILOSEC) 20 MG capsule [id number]  Take 20 mg by mouth daily. [provider]  Active Self  ondansetron (ZOFRAN-ODT) 4 MG disintegrating tablet [id number] No Take 1 tablet (4 mg total) by mouth every 8 (eight) hours as needed for nausea or vomiting.  Patient not taking: Reported on 07/15/2023   [name], PA-C Not Taking Active   rosuvastatin (CRESTOR) 20 MG tablet [id number] Yes Take 1 tablet (20 mg total) by mouth daily. [name], PA-C Taking Active   traZODone (DESYREL) 50 MG tablet [id number] Yes Take 0.5 tablets (25 mg total) by mouth at bedtime as needed for sleep. [name], PA-C Taking Active               Assessment/Plan:   Diabetes: blood glucose and A1c not at goal - Reviewed goal A1c, goal fasting, and goal 2 hour post prandial glucose - Reviewed dietary modifications including: - Recommend to continue to increase [name] by 2 units every 2 days until FBG is < 150. Continue Farxiga 10mg  daily.  - Reminded to check glucose by scanning Libre 2 sensor at least 4 times a day - with meals and at bedtime.  Patient has 1 of the [place] 2 sensors left. Will plan to change to [facility] 3 sensors and app so there is less likely to have data lags - Tried to assist patient in checking and resetting her time zone / time if needed. Unable to complete on phone. Will plan to do when patient is in office for face to face visit. She will also ask her daughter about helping to check time zone and if needed to change. *should be [name] Time Zone [place], [place]) - Will monitor for medication assistance program need for 2025 and reapply for [place].   Hypertension:Currently controlled - Recommend to continue atenolol and amlodipine-benazepril  Hyperlipidemia/ASCVD Risk Reduction: Last LDL was not at goal.  - Reviewed long term complications of uncontrolled cholesterol - Reviewed dietary recommendations including limiting intake of saturated fat - Recommend to take rosuvastatin 20mg  daily. If next LDL not < 100 then consider increasing rosuvastatin to 40mg  daily, add ezetimibe or add PCSK9 (could check Lpa - if high would lean toward starting PCSK9)  Follow Up Plan: 7 to 10 days Continuous Glucose Monitor.   Henrene Pastor, PharmD Clinical Pharmacist [facility]

## 2023-07-22 ENCOUNTER — Other Ambulatory Visit: Payer: Self-pay | Admitting: Family Medicine

## 2023-07-22 DIAGNOSIS — R0981 Nasal congestion: Secondary | ICD-10-CM

## 2023-07-22 NOTE — Telephone Encounter (Signed)
Requested Prescriptions  Pending Prescriptions Disp Refills   cetirizine (ZYRTEC) 10 MG tablet [Pharmacy Med Name: Cetirizine HCl 10 MG Oral Tablet] 30 tablet 0    Sig: Take 1 tablet by mouth once daily     Ear, Nose, and Throat:  Antihistamines 2 Passed - 07/22/2023  6:50 AM      Passed - Cr in normal range and within 360 days    Creatinine, Ser  Date Value Ref Range Status  12/17/2022 0.82 0.57 - 1.00 mg/dL Final         Passed - Valid encounter within last 12 months    Recent Outpatient Visits           4 months ago Type 2 diabetes mellitus with hyperglycemia, with long-term current use of insulin [facility])    [facility] [name], PA-C   7 months ago Encounter for annual wellness exam in Medicare patient   [facility] [place], [place], PA-C   7 months ago Rash   [facility] [place], [place], [place]   7 months ago Vomiting and diarrhea   [facility] [place], [place], PA-C   8 months ago Vomiting and diarrhea   [facility] [name], [place]

## 2023-07-26 ENCOUNTER — Other Ambulatory Visit: Payer: Medicare Other | Admitting: Pharmacist

## 2023-07-26 NOTE — Progress Notes (Addendum)
07/26/2023 Name: Shari Lee MRN: [id number] DOB: [birth year]  Chief Complaint  Patient presents with   Diabetes   Medication Management    Shari Lee is a 67 y.o. year old female who presented for a telephone visit.   They were referred to the pharmacist by their PCP for assistance in managing diabetes, medication access, and complex medication management.   Subjective:  Medication Access/Adherence  Current Pharmacy:  [facility] [address] ([address]), [place] - [address] [place]) [place] [address] Phone: [phone] Fax: [phone]  [facility] - [place], [place] - [address] [place] [address] Phone: [phone] Fax: [phone]   Patient reports affordability concerns with their medications: No  - She is getting [name], pen needles and Farxiga from medication assistance program  Patient reports access/transportation concerns to their pharmacy: No  Patient reports adherence concerns with their medications:  No     Diabetes:  Current medications:  [name] 10mg  daily - getting from [facility] and Me patient assistance program  [name] 44 units daily (plan is to continue to increase by 2 units every 2 to 3 days). Getting from [employer] medication assistance program along with pen needles  Medications tried in the past: Ozempic - stopped due to pancreatitis; metformin - worsened IBS related diarrhea   Date of Download: 07/26/2023  % Time CGM is active: 40% (previously 29%) Average Glucose: 254 ml/dL (previously 093 mg/dL) Glucose Management Indicator: not enough data to determine  Glucose Variability: 27.2% (goal <36%) (previously was 26.6%) Time in Goal:  - Time in range 70-180: 13% (previously 3%) - Time above range: 87% ( previously 97%) - Time below range: 0%  Observed patterns:   Blood glucose is improving slowly with titration of [name] over the last 2 weeks.  Times of missing  data mostly during daytime - however patient states she is scanning everyday between 7am and 9am (though her report would suggest she is scanning either right before midnight or just after midnight. (I suspect she might have incorrect time setting). Patient has 3 more Libre 2 sensors to use. We are planning to change to [facility] 3 in future which should eliminate missing blood glucose data.           Patient denies hypoglycemic s/sx including no dizziness, shakiness, sweating.  Patient reports hyperglycemic symptoms including polydipsia, nocturia (1 to 3 times per night), blurred vision (occasionally - had cataract surgery this year and not sure if vision changes are related to surgery or blood glucose - following with ophthalmology)   Current meal patterns:  - Breakfast (around 11a to 12p): hash brown, ham biscuit - goes to [employer] most mornings.  - Lunch: usually nothing or she goes out to lunch with friends.  - Supper: eat from 5 to 6:30 pm - eats out at [employer] - roast beef, mashed potatoes, macaroni and cheese. She is trying to include fewer high CHO foods and more low CHO vegetables.  - Snacks: sometimes snacks on cashews, cheese bites - Drinks: water, sometimes half and half tea.  Doesn't cook much, single  Hyperlipidemia/ASCVD Risk Reduction  Current lipid lowering medications: rosuvastatin 20mg  daily   Today patient reports that she suspects rosuvastatin might be affecting her memory. She read that this could be a side effect.  She states she had 3 occurences within a 2 week period and she is worried about her memory.  The first  time, she felt like she had something she was suppose to do but couldn't remember. She ended up missing dinner appointment with a friend.  The second time, her hairdresser had called to change her appointment to earlier in the day but within the next 2 hours she completely forgot about the time change and missed her appointment The third time  her friend called about meeting for lunch at 10:30am at 12:30p her friend called to see if she was OK or if she has just forgotten about their appointment.    Objective:  Lab Results  Component Value Date   HGBA1C 7.8 (H) 03/21/2023    Lab Results  Component Value Date   CREATININE 0.82 12/17/2022   BUN 20 12/17/2022   NA 141 12/17/2022   K 4.6 12/17/2022   CL 103 12/17/2022   CO2 22 12/17/2022    Lab Results  Component Value Date   CHOL 251 (H) 03/21/2023   HDL 66 03/21/2023   LDLCALC 169 (H) 03/21/2023   TRIG 92 03/21/2023   CHOLHDL 3.8 03/21/2023    Medications Reviewed Today     Reviewed by Henrene Pastor, RPH-CPP (Pharmacist) on 07/26/23 at 1222  Med List Status: <None>   Medication Order Taking? Sig Documenting Provider Last Dose Status Informant  albuterol (VENTOLIN HFA) 108 (90 Base) MCG/ACT inhaler [id number] No Inhale 2 puffs into the lungs every 6 (six) hours as needed for wheezing or shortness of breath. Jacky Kindle, FNP Taking Active   amLODipine-benazepril (LOTREL) 5-20 MG capsule [id number] No Take 1 capsule by mouth once daily Drubel, Lillia Abed, PA-C Taking Active   atenolol (TENORMIN) 50 MG tablet [id number] No Take 1 tablet by mouth once daily Pardue, Sarah N, DO Taking Active   Blood Glucose Monitoring Suppl (CONTOUR NEXT USB MONITOR) w/Device KIT [id number] No 1 kit by Does not apply route daily. To check blood sugar once daily.  Patient not taking: Reported on 07/15/2023   Reine Just Not Taking Active   CareTouch Safety Lancets 26G MISC [id number] No 1 Device by Does not apply route daily. To check blood sugar once daily.  Patient not taking: Reported on 07/15/2023   Margaretann Loveless, PA-C Not Taking Active   celecoxib (CELEBREX) 100 MG capsule [id number] No Take 1 capsule (100 mg total) by mouth 2 (two) times daily. [name], PA-C Taking Active   cetirizine (ZYRTEC) 10 MG tablet [id number]  Take 1 tablet by mouth once daily Drubel,  Lillia Abed, PA-C  Active   Continuous Blood Gluc Receiver (FREESTYLE LIBRE 2 READER) DEVI [id number] No Use to monitor blood sugars continuously as prescribed.  Patient not taking: Reported on 07/15/2023   [name], PA-C Not Taking Active   Continuous Glucose Sensor (FREESTYLE LIBRE 2 SENSOR) MISC [id number] No USE TO MONITOR BLOOD SUGAR CONTINUOUSLY AS DIRECTED Pardue, Monico Blitz, DO Taking Active   dapagliflozin propanediol (FARXIGA) 10 MG TABS tablet [id number] No Take 1 tablet (10 mg total) by mouth daily. Patient receives through [employer] Patient Assistance [name], PA-C Taking Active            Med Note Clydie Braun, Feige Lowdermilk B   Fri Jul 08, 2023 12:00 PM) [employer] [place] medication assistance program thru 10/18/2023  fluticasone (FLONASE) 50 MCG/ACT nasal spray [id number] No Place 2 sprays into both nostrils daily. Jacky Kindle, FNP Taking Active   glucose blood (CONTOUR NEXT TEST) test strip [id number] No To check blood sugar once daily. Margaretann Loveless, PA-C Taking Active  insulin degludec ([name] FLEXTOUCH) 100 UNIT/ML FlexTouch Pen [id number] No Inject 46 Units into the skin daily. [provider] Taking Active            Med Note Clydie Braun, Shawny Borkowski B   Fri Jul 08, 2023 11:15 AM) Gets from [employer] MAP  Insulin Pen Needle (BD PEN NEEDLE NANO 2ND GEN) 32G X 4 MM MISC [id number] No To use with Lantus injections. Margaretann Loveless, PA-C Taking Active   Insulin Pen Needle (NOVOFINE PLUS) 32G X 4 MM MISC [id number] No To use with ozempic injections [name] M, PA-C Taking Active   metoCLOPramide (REGLAN) 5 MG tablet [id number] No Take 1 tablet (5 mg total) by mouth 4 (four) times daily. [name], PA-C Not Taking Active   montelukast (SINGULAIR) 10 MG tablet [id number] No Take 1 tablet (10 mg total) by mouth at bedtime. Jacky Kindle, FNP Taking Active Self  omeprazole (PRILOSEC) 20 MG capsule [id number] No Take 20 mg by mouth daily. [provider] Taking Active  Self  ondansetron (ZOFRAN-ODT) 4 MG disintegrating tablet [id number] No Take 1 tablet (4 mg total) by mouth every 8 (eight) hours as needed for nausea or vomiting.  Patient not taking: Reported on 07/15/2023   [name], PA-C Not Taking Active   rosuvastatin (CRESTOR) 20 MG tablet [id number] No Take 1 tablet (20 mg total) by mouth daily. [name], PA-C Taking Active   traZODone (DESYREL) 50 MG tablet [id number] No Take 0.5 tablets (25 mg total) by mouth at bedtime as needed for sleep. [name], PA-C Taking Active               Assessment/Plan:   Diabetes: blood glucose and A1c not at goal - Reviewed goal A1c, goal fasting, and goal 2 hour post prandial glucose - Reviewed dietary modifications including: limiting intake of food high in carbohydrates.  - Continue to increase [name] U100 by 2 units every 2 days until FBG is < 150. (May switch to [name] U200 in future depending on dose needed to get patient to goal) Continue Farxiga 10mg  daily.  - Reminded to check glucose by scanning Libre 2 sensor at least 3 times a day. Scanning at mealtime is a good signal to remember to scan.  Patient has 3 of the [place] 2 sensors left. Will plan to change to [place] 3 sensors and update to [place] 3 app because the [place] 3 is less likely to have data lapses.  - Again tried to assist patient in checking and resetting her time zone / time if needed. Unable to complete on phone. Will plan to do when patient is in office for face to face visit. - Will monitor for medication assistance program need for 2025 and reapply for [name] and Farxiga.  Can start process for Novo around 10/16 and Farxiga 11/1. Will have med assist team mail her applications to begin process.   Hypertension:Currently controlled - Recommend to continue atenolol and amlodipine-benazepril  Hyperlipidemia/ASCVD Risk Reduction: Last LDL was not at goal.  - Reviewed long term complications of uncontrolled cholesterol -  increase in heart attacks and stroke. Also high cholesterol can increase blockages to vessels in the brain that can affect memory.  - Changes in memory have been reported with statins but this effect is rare. Will consult with her PCP to consider a trial office rosuvastatin for 4 weeks to see if any change. During that time could add on ezetimibe 10mg  daily   Follow Up Plan: 14 days  Continuous Glucose Monitor.   Henrene Pastor, PharmD Clinical Pharmacist [facility]    07/29/2023 Addendum:  [name], PA-C  Henrene Pastor, RPH-CPP I'm not sure if she takes it consistently regardless.. I would hate if she stopped it I really doubt it is suddenly causing some changes in memory. She should probably have a UA or culture. If she really wants we can do a trial basis, and we can add ezetimibe. She could also consider repatha.  Patient notified of Lindsay's recommendations. She will hold rosuvastatin for 4 weeks. Start ezetimibe 10mg  daily.

## 2023-07-29 MED ORDER — EZETIMIBE 10 MG PO TABS: 10.0000 mg | ORAL_TABLET | Freq: Every day | ORAL | 1 refills | Status: DC

## 2023-07-29 NOTE — Addendum Note (Signed)
Addended by: Henrene Pastor B on: 07/29/2023 04:24 PM   Modules accepted: Orders

## 2023-08-01 ENCOUNTER — Telehealth: Payer: Self-pay

## 2023-08-01 NOTE — Telephone Encounter (Signed)
Used [employer] digital assistant to reach out to pt for reenrollment, will wait for novo nordisk application because online app is faster turnaround, waiting on provider to choose dosing for Shari Lee Flextouch before beginning online app

## 2023-08-02 ENCOUNTER — Other Ambulatory Visit: Payer: Self-pay | Admitting: Physician Assistant

## 2023-08-02 DIAGNOSIS — R413 Other amnesia: Secondary | ICD-10-CM

## 2023-08-03 ENCOUNTER — Other Ambulatory Visit (HOSPITAL_COMMUNITY): Payer: Self-pay

## 2023-08-04 ENCOUNTER — Other Ambulatory Visit (HOSPITAL_COMMUNITY): Payer: Self-pay

## 2023-08-04 ENCOUNTER — Telehealth: Payer: Self-pay | Admitting: Pharmacist

## 2023-08-04 NOTE — Telephone Encounter (Signed)
Attempted online application, mailing to pt's home address

## 2023-08-04 NOTE — Telephone Encounter (Signed)
See message below - Shari Lee has order urine culture for patient. I called to make appt but no answer. LM on VM, patient can call main office number [phone] or my number [phone] to make appointment with office lab.    Message Received: 2 days ago [name], PA-C  Shari Lee, RPH-CPP Lab is fine. I'll put an order in       Previous Messages    ----- Message ----- From: Shari Lee, RPH-CPP Sent: 07/29/2023   4:24 PM EDT To: [name], PA-C  Do you want her to make an appointment with you for UA check or just come into lab to leave a sample?

## 2023-08-04 NOTE — Telephone Encounter (Signed)
Online application failed due to pt's income being too high  PAP: PAP application for [name] Flextouch and pen needles, [employer]) has been mailed to [employer] home address on file. Will fax provider portion of application to provider's office when pt's portion is received.

## 2023-08-09 ENCOUNTER — Telehealth: Payer: Self-pay | Admitting: Pharmacist

## 2023-08-09 ENCOUNTER — Other Ambulatory Visit: Payer: Self-pay | Admitting: Pharmacist

## 2023-08-09 NOTE — Telephone Encounter (Signed)
Attempted to contact patient for follow up phone visit regarding diabetes. Unable to reach patient. LM on VM with my contact number [phone] or [phone]

## 2023-08-18 ENCOUNTER — Other Ambulatory Visit: Payer: Self-pay | Admitting: Family Medicine

## 2023-08-18 DIAGNOSIS — E1165 Type 2 diabetes mellitus with hyperglycemia: Secondary | ICD-10-CM

## 2023-08-24 ENCOUNTER — Other Ambulatory Visit: Payer: Self-pay | Admitting: Family Medicine

## 2023-08-24 DIAGNOSIS — I10 Essential (primary) hypertension: Secondary | ICD-10-CM

## 2023-08-25 ENCOUNTER — Telehealth: Payer: Self-pay | Admitting: Family Medicine

## 2023-08-25 ENCOUNTER — Other Ambulatory Visit: Payer: Medicare Other | Admitting: Pharmacist

## 2023-08-25 NOTE — Telephone Encounter (Signed)
Patient called  back 11/7 - see other phone record

## 2023-08-25 NOTE — Progress Notes (Signed)
08/25/2023 Name: Shari Lee MRN: [id number] DOB: [birth year]  Chief Complaint  Patient presents with   Diabetes    Shari Lee is a 67 y.o. year old female who presented for a telephone visit.   They were referred to the pharmacist by their PCP for assistance in managing diabetes, medication access, and complex medication management.   Subjective:  Medication Access/Adherence  Current Pharmacy:  [facility] [address]), [place] - [address] [place]) [place] [address] Phone: [phone] Fax: [phone]  [facility] - [place], [place] - [address] [place] [address] Phone: [phone] Fax: [phone]   Patient reports affordability concerns with their medications: No  - She is getting [name], pen needles and Farxiga from medication assistance program  Patient reports access/transportation concerns to their pharmacy: No  Patient reports adherence concerns with their medications:  No     Diabetes:  Current medications:  [name] 10mg  daily - getting from [facility] and Me patient assistance program  [name] 44 units daily (plan is to continue to increase by 2 units every 2 to 3 days). Getting from [employer] medication assistance program along with pen needles  Medications tried in the past: Ozempic - stopped due to pancreatitis; metformin - worsened IBS related diarrhea   Date of Download: 08/25/2023  % Time CGM is active: 65% (previously 40%) Average Glucose: 189 ml/dL (previously 737 mg/dL) Glucose Management Indicator: 7.8% Glucose Variability: 37.5% (goal <36%) (previously was 27.2%) Time in Goal:  - Time in range 70-180: 50% (previously 13%) - Time above range: 49% ( previously 87%) - Time below range: 1% ( previously 0%)  Observed patterns:   Blood glucose has improving with titration of [name] over the last 2 weeks. Significant increase in time in range.  Patient did have a  few lows over night but lowered [name] dose back to 44 units a day and lows improved.  Times of missing data mostly during daytime - however patient states she is scanning everyday between 7am and 9am (though her report would suggest she is scanning either right before midnight or just after midnight. (I suspect she might have incorrect time setting). Patient has 1 more Libre 2 sensors to use. We are planning to change to [name] 3 in future which should eliminate missing blood glucose data.              Patient had a few incidences of hypoglycemia Patient reports hyperglycemic symptoms including polydipsia, nocturia (1 to 3 times per night), blurred vision (occasionally - had cataract surgery this year and not sure if vision changes are related to surgery or blood glucose - following with ophthalmology)   Current meal patterns:  - Breakfast (around 11a to 12p): hash brown, ham biscuit - goes to [employer] most mornings.  - Lunch: usually nothing or she goes out to lunch with friends.  - Supper: eat from 5 to 6:30 pm - eats out at [employer] - roast beef, mashed potatoes, macaroni and cheese. She is trying to include fewer high CHO foods and more low CHO vegetables.  - Snacks: sometimes snacks on cashews, cheese bites - Drinks: water, sometimes half and half tea.  Doesn't cook much, single   Objective:  Lab Results  Component Value Date   HGBA1C 7.8 (H) 03/21/2023    Lab Results  Component Value Date   CREATININE 0.82 12/17/2022   BUN 20 12/17/2022  NA 141 12/17/2022   K 4.6 12/17/2022   CL 103 12/17/2022   CO2 22 12/17/2022    Lab Results  Component Value Date   CHOL 251 (H) 03/21/2023   HDL 66 03/21/2023   LDLCALC 169 (H) 03/21/2023   TRIG 92 03/21/2023   CHOLHDL 3.8 03/21/2023    Medications Reviewed Today     Reviewed by Henrene Pastor, RPH-CPP (Pharmacist) on 08/25/23 at 1708  Med List Status: <None>   Medication Order Taking? Sig Documenting Provider  Last Dose Status Informant  albuterol (VENTOLIN HFA) 108 (90 Base) MCG/ACT inhaler [id number] No Inhale 2 puffs into the lungs every 6 (six) hours as needed for wheezing or shortness of breath. Jacky Kindle, FNP Taking Active   amLODipine-benazepril (LOTREL) 5-20 MG capsule [id number] No Take 1 capsule by mouth once daily Drubel, Lillia Abed, PA-C Taking Active   atenolol (TENORMIN) 50 MG tablet [id number]  Take 1 tablet by mouth once daily Merita Norton T, FNP  Active   Blood Glucose Monitoring Suppl (CONTOUR NEXT USB MONITOR) w/Device KIT [id number] No 1 kit by Does not apply route daily. To check blood sugar once daily.  Patient not taking: Reported on 07/15/2023   Reine Just Not Taking Active   CareTouch Safety Lancets 26G MISC [id number] No 1 Device by Does not apply route daily. To check blood sugar once daily.  Patient not taking: Reported on 07/15/2023   Margaretann Loveless, PA-C Not Taking Active   celecoxib (CELEBREX) 100 MG capsule [id number] No Take 1 capsule (100 mg total) by mouth 2 (two) times daily. [name], PA-C Taking Active   cetirizine (ZYRTEC) 10 MG tablet [id number]  Take 1 tablet by mouth once daily Drubel, Lillia Abed, PA-C  Active   Continuous Blood Gluc Receiver (FREESTYLE LIBRE 2 READER) DEVI [id number] No Use to monitor blood sugars continuously as prescribed.  Patient not taking: Reported on 07/15/2023   [name], PA-C Not Taking Active   Continuous Glucose Sensor (FREESTYLE LIBRE 2 SENSOR) [place] [id number]  USE TO MONITOR BLOOD SUGAR CONTINUOUSLY AS DIRECTED Jacky Kindle, FNP  Active   dapagliflozin propanediol (FARXIGA) 10 MG TABS tablet [id number] No Take 1 tablet (10 mg total) by mouth daily. Patient receives through [employer] Patient Assistance [name], PA-C Taking Active            Med Note Clydie Braun, Josian Lanese B   Fri Jul 08, 2023 12:00 PM) [employer] and Me medication assistance program thru 10/18/2023  ezetimibe (ZETIA) 10 MG tablet [id number]  Take 1 tablet  (10 mg total) by mouth daily. [name], PA-C  Active   fluticasone (FLONASE) 50 MCG/ACT nasal spray [id number] No Place 2 sprays into both nostrils daily. Jacky Kindle, FNP Taking Active   glucose blood (CONTOUR NEXT TEST) test strip [id number] No To check blood sugar once daily. [name] M, PA-C Taking Active   insulin degludec ([name] FLEXTOUCH) 100 UNIT/ML FlexTouch Pen [id number] No Inject 44 Units into the skin daily. [provider] Taking Active            Med Note Clydie Braun, Aisia Correira B   Fri Jul 08, 2023 11:15 AM) Gets from [employer] MAP  Insulin Pen Needle (BD PEN NEEDLE NANO 2ND GEN) 32G X 4 MM MISC [id number] No To use with Lantus injections. Margaretann Loveless, PA-C Taking Active   Insulin Pen Needle (NOVOFINE PLUS) 32G X 4 MM MISC [id number] No To use with ozempic injections Margaretann Loveless, PA-C Taking Active  metoCLOPramide (REGLAN) 5 MG tablet [id number] No Take 1 tablet (5 mg total) by mouth 4 (four) times daily. [name], PA-C Not Taking Active   montelukast (SINGULAIR) 10 MG tablet [id number] No Take 1 tablet (10 mg total) by mouth at bedtime. Jacky Kindle, FNP Taking Active Self  omeprazole (PRILOSEC) 20 MG capsule [id number] No Take 20 mg by mouth daily. [provider] Taking Active Self  ondansetron (ZOFRAN-ODT) 4 MG disintegrating tablet [id number] No Take 1 tablet (4 mg total) by mouth every 8 (eight) hours as needed for nausea or vomiting.  Patient not taking: Reported on 07/15/2023   [name], PA-C Not Taking Active   rosuvastatin (CRESTOR) 20 MG tablet [id number] No Take 1 tablet (20 mg total) by mouth daily. [name], PA-C Taking Active   traZODone (DESYREL) 50 MG tablet [id number] No Take 0.5 tablets (25 mg total) by mouth at bedtime as needed for sleep. [name], PA-C Taking Active               Assessment/Plan:   Diabetes: blood glucose and A1c not at goal but blood glucose per Continuous Glucose  Monitor report shows improvement over the last month - Reviewed goal A1c, goal fasting, and goal 2 hour post prandial glucose - Reviewed dietary modifications including: limiting intake of food high in carbohydrates.  - Continue  [name] U100  - inject 44 units daily by 2 units every 2 days until FBG is < 150.   - Continue Farxiga 10mg  daily.  - Reminded to check glucose by scanning Libre 2 sensor at least 3 times a day. Scanning at mealtime is a good signal to remember to scan.  Patient will use her last [place] 2 sensor. Sending in prescription for [facility] 3+ sensors.   - Sent follow up message to Med Assistnace Team since patient has not received paper application for [employer] yet. Asked for them to resend and also to either send paper app for [name] / [employer] and Me or assist patient with online application.  Hyperlipidemia/ASCVD Risk Reduction: Last LDL was not at goal.  - Reviewed long term complications of uncontrolled cholesterol - increase in heart attacks and stroke. Also high cholesterol can increase blockages to vessels in the brain that can affect memory.  - Continue rosuvastatin  Follow Up Plan: 14 days Continuous Glucose Monitor.  Also sent PCP message to request next follow-up and to notify Linday Drubel [facility] that patient would like to continue to see her at [facility] office.   Henrene Pastor, PharmD Clinical Pharmacist [facility]

## 2023-08-25 NOTE — Telephone Encounter (Signed)
Patient called back. Discussed Continuous Glucose Monitor and DM - see telephone note.  I noticed that her PCP has been changed back to [place] office. Patient endorses today that she would prefer to continue to see [name], [facility] and will come the [facility] office.  She was not sure when Lillia Abed wanted to see if back. Will forward message to Lillia Abed and also send to our front desk to see if they can change PCP back.

## 2023-08-25 NOTE — Telephone Encounter (Signed)
Attempted to return call. Unable to reach patient. LM On VM with my CB# [phone]

## 2023-08-25 NOTE — Telephone Encounter (Signed)
Pt called to speak to Henrene Pastor, Please call back at [phone]

## 2023-08-26 ENCOUNTER — Other Ambulatory Visit: Payer: Self-pay | Admitting: Physician Assistant

## 2023-08-26 DIAGNOSIS — R0981 Nasal congestion: Secondary | ICD-10-CM

## 2023-08-26 MED ORDER — FREESTYLE LIBRE 3 PLUS SENSOR MISC: 1 refills | Status: DC

## 2023-08-26 NOTE — Telephone Encounter (Signed)
Called patient to schedule f/u, patient did not have her calendar available so she said she would call the office back to schedule one later today

## 2023-08-29 ENCOUNTER — Other Ambulatory Visit (HOSPITAL_COMMUNITY): Payer: Self-pay

## 2023-08-30 NOTE — Telephone Encounter (Signed)
Applications for [employer] and [employer] have been sent to pt's address.

## 2023-09-06 ENCOUNTER — Encounter: Payer: Self-pay | Admitting: Physician Assistant

## 2023-09-06 ENCOUNTER — Ambulatory Visit (INDEPENDENT_AMBULATORY_CARE_PROVIDER_SITE_OTHER): Payer: Medicare Other | Admitting: Physician Assistant

## 2023-09-06 VITALS — BP 118/80 | HR 62 | Temp 98.1°F | Resp 18 | Ht 61.0 in | Wt 199.2 lb

## 2023-09-06 DIAGNOSIS — G8929 Other chronic pain: Secondary | ICD-10-CM | POA: Diagnosis not present

## 2023-09-06 DIAGNOSIS — I152 Hypertension secondary to endocrine disorders: Secondary | ICD-10-CM | POA: Diagnosis not present

## 2023-09-06 DIAGNOSIS — Z1231 Encounter for screening mammogram for malignant neoplasm of breast: Secondary | ICD-10-CM

## 2023-09-06 DIAGNOSIS — E1165 Type 2 diabetes mellitus with hyperglycemia: Secondary | ICD-10-CM

## 2023-09-06 DIAGNOSIS — M25572 Pain in left ankle and joints of left foot: Secondary | ICD-10-CM

## 2023-09-06 DIAGNOSIS — M25462 Effusion, left knee: Secondary | ICD-10-CM

## 2023-09-06 DIAGNOSIS — E785 Hyperlipidemia, unspecified: Secondary | ICD-10-CM

## 2023-09-06 DIAGNOSIS — E1159 Type 2 diabetes mellitus with other circulatory complications: Secondary | ICD-10-CM

## 2023-09-06 DIAGNOSIS — M25562 Pain in left knee: Secondary | ICD-10-CM

## 2023-09-06 DIAGNOSIS — E1169 Type 2 diabetes mellitus with other specified complication: Secondary | ICD-10-CM

## 2023-09-06 DIAGNOSIS — M25561 Pain in right knee: Secondary | ICD-10-CM | POA: Diagnosis not present

## 2023-09-06 DIAGNOSIS — Z794 Long term (current) use of insulin: Secondary | ICD-10-CM | POA: Diagnosis not present

## 2023-09-06 LAB — COMPREHENSIVE METABOLIC PANEL
ALT: 12 U/L (ref 0–35)
AST: 12 U/L (ref 0–37)
Albumin: 4.4 g/dL (ref 3.5–5.2)
Alkaline Phosphatase: 89 U/L (ref 39–117)
BUN: 21 mg/dL (ref 6–23)
CO2: 28 meq/L (ref 19–32)
Calcium: 9.6 mg/dL (ref 8.4–10.5)
Chloride: 102 meq/L (ref 96–112)
Creatinine, Ser: 0.92 mg/dL (ref 0.40–1.20)
GFR: 64.8 mL/min (ref >60.00)
Glucose, Bld: 118 mg/dL — ABNORMAL HIGH (ref 70–99)
Potassium: 4.9 meq/L (ref 3.5–5.1)
Sodium: 138 meq/L (ref 135–145)
Total Bilirubin: 0.5 mg/dL (ref 0.2–1.2)
Total Protein: 7 g/dL (ref 6.0–8.3)

## 2023-09-06 LAB — LIPID PANEL
Cholesterol: 234 mg/dL — ABNORMAL HIGH (ref 0–200)
HDL: 55.4 mg/dL (ref >39.00)
LDL Cholesterol: 162 mg/dL — ABNORMAL HIGH (ref 0–99)
NonHDL: 178.69
Total CHOL/HDL Ratio: 4
Triglycerides: 83 mg/dL (ref 0.0–149.0)
VLDL: 16.6 mg/dL (ref 0.0–40.0)

## 2023-09-06 LAB — HEMOGLOBIN A1C: Hgb A1c MFr Bld: 10.5 % — ABNORMAL HIGH (ref 4.6–6.5)

## 2023-09-06 NOTE — Assessment & Plan Note (Signed)
Last LDL well over goal, had increased crestor to 20 mg  Repeat fasting lipids today  LDL goal < 70

## 2023-09-06 NOTE — Assessment & Plan Note (Signed)
Chronic, well controlled. Managed with amlodipine 5 mg benazepril 20 mg, atenolol 50 mg Ordered cmp F/u 6 mo

## 2023-09-06 NOTE — Progress Notes (Signed)
Established patient visit   Patient: Shari Lee   DOB: December 01, 1955   67 y.o. Female  MRN: [id number] Visit Date: 09/06/2023  Today's healthcare provider: [name], PA-C   Cc. Dm. Htn . Hld f/u  Subjective     Diabetes Mellitus Type II, Follow-up  Lab Results  Component Value Date   HGBA1C 7.8 (H) 03/21/2023   HGBA1C 6.7 (H) 11/19/2022   HGBA1C 6.6 (A) 08/17/2022   Wt Readings from Last 3 Encounters:  09/06/23 199 lb 3.2 oz (90.4 kg)  07/05/23 196 lb 12.8 oz (89.3 kg)  03/21/23 179 lb 11.2 oz (81.5 kg)   Home blood sugar records: , averaging in 200s. Seeing lows in the early morning, and spikes throughout the day until her evening insulin dose.  Episodes of hypoglycemia? She was seeing lows in the early AM, dropped from 46 to 44 U  Pertinent Labs: Lab Results  Component Value Date   CHOL 251 (H) 03/21/2023   HDL 66 03/21/2023   LDLCALC 169 (H) 03/21/2023   TRIG 92 03/21/2023   CHOLHDL 3.8 03/21/2023   Lab Results  Component Value Date   NA 141 12/17/2022   K 4.6 12/17/2022   CREATININE 0.82 12/17/2022   EGFR 79 12/17/2022   MICRALBCREAT 17 12/17/2022     ---------------------------------------------------------------------------------------------------  Pt reports persistent L ankle pain, and significant bilateral knee pain, left worse than the right. This keeps her from walking. The celebrex prescribed for her ankle is no longer effective.  Medications: Outpatient Medications Prior to Visit  Medication Sig   albuterol (VENTOLIN HFA) 108 (90 Base) MCG/ACT inhaler Inhale 2 puffs into the lungs every 6 (six) hours as needed for wheezing or shortness of breath.   amLODipine-benazepril (LOTREL) 5-20 MG capsule Take 1 capsule by mouth once daily   atenolol (TENORMIN) 50 MG tablet Take 1 tablet by mouth once daily   celecoxib (CELEBREX) 100 MG capsule Take 1 capsule (100 mg total) by mouth 2 (two) times daily.   cetirizine (ZYRTEC) 10 MG tablet Take  1 tablet by mouth once daily   Continuous Glucose Sensor (FREESTYLE LIBRE 3 PLUS SENSOR) MISC Change sensor every 15 days.   dapagliflozin propanediol (FARXIGA) 10 MG TABS tablet Take 1 tablet (10 mg total) by mouth daily. Patient receives through [employer] Patient Assistance   ezetimibe (ZETIA) 10 MG tablet Take 1 tablet (10 mg total) by mouth daily.   fluticasone (FLONASE) 50 MCG/ACT nasal spray Place 2 sprays into both nostrils daily.   glucose blood (CONTOUR NEXT TEST) test strip To check blood sugar once daily.   insulin degludec ([name] FLEXTOUCH) 100 UNIT/ML FlexTouch Pen Inject 44 Units into the skin daily.   Insulin Pen Needle (BD PEN NEEDLE NANO 2ND GEN) 32G X 4 MM MISC To use with Lantus injections.   Insulin Pen Needle (NOVOFINE PLUS) 32G X 4 MM MISC To use with ozempic injections   metoCLOPramide (REGLAN) 5 MG tablet Take 1 tablet (5 mg total) by mouth 4 (four) times daily.   montelukast (SINGULAIR) 10 MG tablet Take 1 tablet (10 mg total) by mouth at bedtime.   omeprazole (PRILOSEC) 20 MG capsule Take 20 mg by mouth daily.   rosuvastatin (CRESTOR) 20 MG tablet Take 1 tablet (20 mg total) by mouth daily.   traZODone (DESYREL) 50 MG tablet Take 0.5 tablets (25 mg total) by mouth at bedtime as needed for sleep.   Blood Glucose Monitoring Suppl (CONTOUR NEXT USB MONITOR) w/Device KIT 1  kit by Does not apply route daily. To check blood sugar once daily. (Patient not taking: Reported on 07/15/2023)   CareTouch Safety Lancets 26G MISC 1 Device by Does not apply route daily. To check blood sugar once daily. (Patient not taking: Reported on 07/15/2023)   ondansetron (ZOFRAN-ODT) 4 MG disintegrating tablet Take 1 tablet (4 mg total) by mouth every 8 (eight) hours as needed for nausea or vomiting. (Patient not taking: Reported on 07/15/2023)   No facility-administered medications prior to visit.    Review of Systems  Constitutional:  Negative for fatigue and fever.  Respiratory:  Negative for  cough and shortness of breath.   Cardiovascular:  Negative for chest pain and leg swelling.  Gastrointestinal:  Negative for abdominal pain.  Musculoskeletal:  Positive for arthralgias and myalgias.  Neurological:  Negative for dizziness and headaches.       Objective    BP 118/80 (BP Location: Left Arm, Patient Position: Sitting, Cuff Size: Large)   Pulse 62   Temp 98.1 F (36.7 C) (Oral)   Resp 18   Ht 5\' 1"  (1.549 m)   Wt 199 lb 3.2 oz (90.4 kg)   SpO2 98%   BMI 37.64 kg/m    Physical Exam Constitutional:      General: She is awake.     Appearance: She is well-developed.  HENT:     Head: Normocephalic.  Eyes:     Conjunctiva/sclera: Conjunctivae normal.  Cardiovascular:     Rate and Rhythm: Normal rate and regular rhythm.     Heart sounds: Normal heart sounds.  Pulmonary:     Effort: Pulmonary effort is normal.  Musculoskeletal:     Comments: L knee with edema, no erythema.   Skin:    General: Skin is warm.  Neurological:     Mental Status: She is alert and oriented to person, place, and time.  Psychiatric:        Attention and Perception: Attention normal.        Mood and Affect: Mood normal.        Speech: Speech normal.        Behavior: Behavior is cooperative.     No results found for any visits on 09/06/23.  Assessment & Plan    Acute left ankle pain Negative xray, small heel spur, but pain is more located at ankle. Referring to PT/ortho -     Ambulatory referral to Physical Therapy  Hyperlipidemia associated with type 2 diabetes mellitus (HCC) Assessment & Plan: Last LDL well over goal, had increased crestor to 20 mg  Repeat fasting lipids today  LDL goal < 70 Type 2 diabetes mellitus with hyperglycemia, with long-term current use of insulin (HCC) Assessment & Plan: Last A1c 7.8%. Pt still struggling with overall wellness and sugar control. Managing with 44 u at bedtime and farxiga 10 mg. Follows w/ pharmacy.   H/o d/c sulfonylurea 2023 2/2  hypoglyemia ( pt was well controlled at the time), d/c ozempic 2/2 pancreatitis.   Today advised to split insulin dose, 22 U in Am and 22 U in PM, f/b 3-4 weeks. If no change appreciated, consider acting repaglinide with meals   Uacr utd, on lipid, acei Due for foot exam.  Orders: -     Lipid panel -     Hemoglobin A1c -     Comprehensive metabolic panel  Effusion of left knee Referring to ortho. Likely 2/2 immobility, arthritis. Recommend elevation, light mobility, ice. -     Ambulatory  referral to Orthopedics  Chronic pain of both knees Long standing, worsened from immobility, hyperglycemia. Referring to PT  -     Ambulatory referral to Physical Therapy  Breast cancer screening by mammogram -     3D Screening Mammogram, Left and Right; Future  Hypertension associated with diabetes (HCC) Assessment & Plan: Chronic, well controlled. Managed with amlodipine 5 mg benazepril 20 mg, atenolol 50 mg Ordered cmp F/u 6 mo  Return in about 3 months (around 12/07/2023) for DMII.       [name], PA-C  [facility] [phone] (phone) [phone] (fax)  [facility]

## 2023-09-06 NOTE — Assessment & Plan Note (Addendum)
Last A1c 7.8%. Pt still struggling with overall wellness and sugar control. Managing with 44 u at bedtime and farxiga 10 mg. Follows w/ pharmacy.   H/o d/c sulfonylurea 2023 2/2 hypoglyemia ( pt was well controlled at the time), d/c ozempic 2/2 pancreatitis.   Today advised to split insulin dose, 22 U in Am and 22 U in PM, f/b 3-4 weeks. If no change appreciated, consider acting repaglinide with meals  Uacr utd, on lipid, acei Due for foot exam.

## 2023-09-06 NOTE — Patient Instructions (Signed)
Cardiology: [phone]

## 2023-09-07 ENCOUNTER — Other Ambulatory Visit: Payer: Self-pay | Admitting: Physician Assistant

## 2023-09-07 DIAGNOSIS — E1169 Type 2 diabetes mellitus with other specified complication: Secondary | ICD-10-CM

## 2023-09-07 MED ORDER — ROSUVASTATIN CALCIUM 40 MG PO TABS: 40.0000 mg | ORAL_TABLET | Freq: Every day | ORAL | 3 refills | Status: DC

## 2023-09-08 ENCOUNTER — Other Ambulatory Visit: Payer: Self-pay | Admitting: Pharmacist

## 2023-09-08 DIAGNOSIS — R0981 Nasal congestion: Secondary | ICD-10-CM

## 2023-09-08 MED ORDER — CETIRIZINE HCL 10 MG PO TABS: 10.0000 mg | ORAL_TABLET | Freq: Every day | ORAL | 1 refills | Status: DC

## 2023-09-08 MED ORDER — MONTELUKAST SODIUM 10 MG PO TABS: 10.0000 mg | ORAL_TABLET | Freq: Every day | ORAL | 1 refills | Status: DC

## 2023-09-08 MED ORDER — EZETIMIBE 10 MG PO TABS: 10.0000 mg | ORAL_TABLET | Freq: Every day | ORAL | 1 refills | Status: DC

## 2023-09-08 NOTE — Progress Notes (Signed)
09/08/2023 Name: Shari Lee MRN: [id number] DOB: 1956-10-15  Chief Complaint  Patient presents with   Diabetes    Shari Lee is a 67 y.o. year old female who presented for a telephone visit.   They were referred to the pharmacist by their PCP for assistance in managing diabetes, medication access, and complex medication management.   Subjective:  Medication Access/Adherence  Current Pharmacy:  [facility] - [place] [address]), [place] - [address] [place]) [place] [address] Phone: [phone] Fax: [phone]  [facility] - [place], [place] - [address] [place] [address] Phone: [phone] Fax: [phone]   Patient reports affordability concerns with their medications: No  - She is getting [name], pen needles and Farxiga from medication assistance program  Patient reports access/transportation concerns to their pharmacy: No  Patient reports adherence concerns with their medications:  No     Diabetes:  Current medications:  Farxiga 10mg  daily - getting from [facility] and Me patient assistance program  [name] 24 units each morning and 22 units each evening (dose was changed 09/07/2023). Getting from [employer] medication assistance program along with pen needles  Medications tried in the past: Ozempic - stopped due to pancreatitis; metformin - worsened IBS related diarrhea   Date of Download: 09/08/2023  % Time CGM is active: 96% (previously 65%) Average Glucose: 212 mg/dL (previously 329 mg/dL) Glucose Management Indicator: 8.4% (preciously was 7.8% Glucose Variability: 32.1% (goal <36%) (previously was 37.5%) Time in Goal:  - Time in range 70-180: 38% (previously 50%) - Time above range: 62% ( previously 49%) - Time below range: 0% ( previously 1%)  Observed patterns:   Much more blood glucose  information available since patient changed to [employer] 3 sensors Less time in the  therapeutic range over the last 2 weeks. Daily blood glucose graph over that last 3 days does show a decreased in the time patient has above goal.           Current meal patterns:  - Breakfast (around 11a to 12p): hash brown, ham biscuit - goes to [employer] most mornings.  - Lunch: usually nothing. Sometime she eats with friends.  - Supper: eat from 5 to 6:30 pm - eats out at [employer] - roast beef, mashed potatoes, macaroni and cheese. She is trying to include fewer high CHO foods and more low CHO vegetables.  - Snacks: sometimes snacks on cashews, walnuts - Drinks: water, sometimes half and half tea.  Doesn't cook much since it just her at home.   Hyperlipidemia:  Current therapy - she was previously taking rosuvastatin 20mg  daily  rosuvastatin was increased to 40mg  daily this week by PCP due to increase in LDL but patient reports today she stopped rosuvastatin because she felt is was causing memory changes.  She believes that memory has improved a little since she stopped.  She would really like to not take a statin medication  Objective:  Lab Results  Component Value Date   HGBA1C 10.5 (H) 09/06/2023    Lab Results  Component Value Date   CREATININE 0.92 09/06/2023   BUN 21 09/06/2023   NA 138 09/06/2023   K 4.9 09/06/2023   CL 102 09/06/2023   CO2 28 09/06/2023    Lab Results  Component Value Date   CHOL 234 (H) 09/06/2023   HDL 55.40 09/06/2023   LDLCALC 162 (H) 09/06/2023   TRIG 83.0 09/06/2023  CHOLHDL 4 09/06/2023    Medications Reviewed Today     Reviewed by Henrene Pastor, RPH-CPP (Pharmacist) on 09/08/23 at 1124  Med List Status: <None>   Medication Order Taking? Sig Documenting Provider Last Dose Status Informant  albuterol (VENTOLIN HFA) 108 (90 Base) MCG/ACT inhaler [id number] Yes Inhale 2 puffs into the lungs every 6 (six) hours as needed for wheezing or shortness of breath. Jacky Kindle, FNP Taking Active   amLODipine-benazepril (LOTREL)  5-20 MG capsule [id number] Yes Take 1 capsule by mouth once daily Drubel, Lillia Abed, PA-C Taking Active   atenolol (TENORMIN) 50 MG tablet [id number] Yes Take 1 tablet by mouth once daily Jacky Kindle, FNP Taking Active   Blood Glucose Monitoring Suppl (CONTOUR NEXT USB MONITOR) w/Device KIT [id number] No 1 kit by Does not apply route daily. To check blood sugar once daily.  Patient not taking: Reported on 07/15/2023   Reine Just Not Taking Active   CareTouch Safety Lancets 26G MISC [id number] No 1 Device by Does not apply route daily. To check blood sugar once daily.  Patient not taking: Reported on 07/15/2023   Margaretann Loveless, PA-C Not Taking Active   cetirizine (ZYRTEC) 10 MG tablet [id number] Yes Take 1 tablet by mouth once daily [name], PA-C Taking Active   Continuous Glucose Sensor (FREESTYLE LIBRE 3 PLUS SENSOR) MISC [id number] Yes Change sensor every 15 days. [name], PA-C Taking Active   dapagliflozin propanediol (FARXIGA) 10 MG TABS tablet [id number] Yes Take 1 tablet (10 mg total) by mouth daily. Patient receives through [employer] Patient Assistance [name], PA-C Taking Active            Med Note Clydie Braun, Glenna Durand   Fri Jul 08, 2023 12:00 PM) [employer] [place] medication assistance program thru 10/18/2023  ezetimibe (ZETIA) 10 MG tablet [id number] Yes Take 1 tablet (10 mg total) by mouth daily. [name], PA-C Taking Active   fluticasone (FLONASE) 50 MCG/ACT nasal spray [id number]  Place 2 sprays into both nostrils daily. Jacky Kindle, FNP  Active   glucose blood (CONTOUR NEXT TEST) test strip [id number] No To check blood sugar once daily.  Patient not taking: Reported on 09/08/2023   Margaretann Loveless, PA-C Not Taking Active   insulin degludec ([name] FLEXTOUCH) 100 UNIT/ML FlexTouch Pen [id number] Yes Inject 44 Units into the skin daily. 24 units each morning and 22 units each evening [provider] Taking Active            Med Note Clydie Braun,  Cadynce Garrette B   Fri Jul 08, 2023 11:15 AM) Gets from [employer] MAP  Insulin Pen Needle (NOVOFINE PLUS) 32G X 4 MM MISC [id number] Yes To use with ozempic injections [name] M, PA-C Taking Active   montelukast (SINGULAIR) 10 MG tablet [id number] Yes Take 1 tablet (10 mg total) by mouth at bedtime. Jacky Kindle, FNP Taking Active Self  omeprazole (PRILOSEC) 20 MG capsule [id number] Yes Take 20 mg by mouth daily. [provider] Taking Active Self           Med Note Clydie Braun, Cindie Laroche Sep 08, 2023 11:21 AM) Purchases OTC  rosuvastatin (CRESTOR) 40 MG tablet [id number] No Take 1 tablet (40 mg total) by mouth daily.  Patient not taking: Reported on 09/08/2023   [name], PA-C Not Taking Active   traZODone (DESYREL) 50 MG tablet [id number] No Take 0.5 tablets (25 mg total) by mouth at bedtime as needed for sleep.  Patient not taking: Reported on 09/08/2023   [name], PA-C Not Taking Active               Assessment/Plan:   Diabetes: blood glucose and A1c not at goal but blood glucose per Continuous Glucose Monitor report shows blood glucose recently has been better than A1c shows.  - Reviewed goal A1c, goal fasting, and goal 2 hour post prandial glucose - Reviewed dietary modifications including: limiting intake of food high in carbohydrates.  - Continue  [name] U100  - 24 units each morning and 22 units each evening.    - Continue Farxiga 10mg  daily.  - Since patient is not able to take GLP agents due to past pancreatitis event, might need to consider prandial insulin in future.  - Reminded to check glucose on phone the [facility]ote[facility] Glassman system prior to meals at least.     Hyperlipidemia/ASCVD Risk Reduction: Last LDL was not at goal. Patient stopped rosuvastatin because she felt was affecting her memory.  - Reviewed long term complications of uncontrolled cholesterol - increase in heart attacks and stroke. Also high cholesterol can increase blockages to vessels in the  brain that can affect memory.  - recommend patient retry a different statin  -pravastatin 80mg  might be a good option since is has lower reports of memory changes compared to atorvastatin and simvastatin. Will discussed with PCP.   Follow Up Plan: 14 days Continuous Glucose Monitor.   Henrene Pastor, PharmD Clinical Pharmacist [facility]

## 2023-09-08 NOTE — Telephone Encounter (Signed)
Faxed NovoNordisk provider portion and [employer] provider portion to [phone]

## 2023-09-08 NOTE — Telephone Encounter (Signed)
Patient is requesting refills for the following medications: cetirizine / Zyrtec, montelukast, ezetimibe. Requesting 90 days supply.

## 2023-09-09 ENCOUNTER — Telehealth: Payer: Self-pay

## 2023-09-09 NOTE — Telephone Encounter (Signed)
-----   Message from Shari Lee sent at 09/09/2023  8:34 AM EST ----- She needs a f/u appt end of Dec for DM

## 2023-09-09 NOTE — Telephone Encounter (Signed)
Called patient to get scheduled. She stated she did not have her calendar on her and will call back to schedule an appointment

## 2023-09-12 DIAGNOSIS — M1712 Unilateral primary osteoarthritis, left knee: Secondary | ICD-10-CM | POA: Diagnosis not present

## 2023-09-12 DIAGNOSIS — M25561 Pain in right knee: Secondary | ICD-10-CM | POA: Diagnosis not present

## 2023-09-16 NOTE — Telephone Encounter (Signed)
PAP: Patient assistance application for Shari Lee has been approved by PAP Companies: [employer] from 10/18/2022 to 10/18/2023. Medication should be delivered to PAP Delivery: Home For further shipping updates, please contact [employer] ([employer]) at [phone] Pt ID is: [id number]  LETTER HAS BEEN SCANNED IN TO MEDIA OF CHART FROM ONBASE

## 2023-09-23 ENCOUNTER — Telehealth: Payer: Self-pay | Admitting: Pharmacist

## 2023-09-23 ENCOUNTER — Encounter: Payer: Self-pay | Admitting: Pharmacist

## 2023-09-23 NOTE — Telephone Encounter (Signed)
Attempt was made to contact patient by phone today for follow up by Clinical Pharmacist regarding diabetes / medication management.  Unable to reach patient. LM on VM with my contact number [phone].

## 2023-09-23 NOTE — Progress Notes (Unsigned)
This encounter was created in error - please disregard.

## 2023-09-28 ENCOUNTER — Ambulatory Visit (INDEPENDENT_AMBULATORY_CARE_PROVIDER_SITE_OTHER): Payer: Medicare Other | Admitting: Pharmacist

## 2023-09-28 DIAGNOSIS — Z794 Long term (current) use of insulin: Secondary | ICD-10-CM

## 2023-09-28 DIAGNOSIS — E1165 Type 2 diabetes mellitus with hyperglycemia: Secondary | ICD-10-CM

## 2023-09-28 NOTE — Progress Notes (Signed)
09/28/2023 Name: Shari Lee MRN: [id number] DOB: 04-20-1956  Chief Complaint  Patient presents with   Diabetes    Shari Lee is a 67 y.o. year old female who presented for a telephone visit.   They were referred to the pharmacist by their PCP for assistance in managing diabetes, medication access, and complex medication management.   Subjective:  Medication Access/Adherence  Current Pharmacy:  [facility] [address] ([address]), [place] - [address] [place]) [place] [address] Phone: [phone] Fax: [phone]  [facility] - [place], [place] - [address] [place] [address] Phone: [phone] Fax: [phone]   Patient reports affordability concerns with their medications: No  - She is getting [name], pen needles and Farxiga from medication assistance program  Patient reports access/transportation concerns to their pharmacy: No  Patient reports adherence concerns with their medications:  No     Diabetes:  Current medications:  Farxiga 10mg  daily - getting from [facility] and Me patient assistance program  [name] 24 units each morning and 22 units each evening (dose was changed 09/07/2023). Getting from [employer] medication assistance program along with pen needles  Today patient reports she missed her evening dose of [name] last night. Suprisingly blood glucose this morning and over night was better than it has been a awhile. See Below.  She did have a low of 67 around 6am on Tuesday 09/27/2023    Medications tried in the past: Ozempic - stopped due to pancreatitis; metformin - worsened IBS related diarrhea   Date of Download: 09/28/2023  % Time CGM is active: 90% (previously 96%) Average Glucose: 216 mg/dL (previously 630 mg/dL) Glucose Management Indicator: 8.5% (preciously was 8.4% Glucose Variability: 33.4% (goal <36%) (previously was 32.1%) Time in Goal:  - Time in range 70-180:  32% (previously 38%) - Time above range: 68% ( previously 62%) - Time below range: 0% ( previously 1%)  Observed patterns:   Much more blood glucose  information available since patient changed to [place] 3 sensors Less time in the therapeutic range over the last 2 weeks. The last 2 days are very much different compared to previous days.  She attended her ganddaughters white coat ceremony yesteday 12/10. She does not feel that she ate any different than usual but she did miss dose of [name] on the evening of 12/10.         Current meal patterns:  - Breakfast (around 11a to 12p): hash brown, ham biscuit - goes to [employer] most mornings.  - Lunch: usually nothing. Sometime she eats with friends.  - Supper: eat from 5 to 6:30 pm - eats out at [employer] - roast beef, mashed potatoes, macaroni and cheese. She is trying to include fewer high CHO foods and more low CHO vegetables.  Last night 09/27/23 - she had steak and salad. A little serving of mashed potatoes and she shared a dessert with her family.  - Snacks: sometimes snacks on cashews, walnuts - Drinks: water, sometimes half and half tea.  Doesn't cook much since it just her at home.   Hyperlipidemia:  Current therapy - she was previously taking rosuvastatin 20mg  daily  rosuvastatin was increased to 40mg  daily this week by PCP due to increase in LDL but patient reports today she stopped rosuvastatin because she felt is was causing memory changes.  She believes that memory has improved a little since she stopped.  She would  really like to not take a statin medication - patient has made appointment to discuss this further with her PCP. Appointment is in January 2025.   Objective:  Lab Results  Component Value Date   HGBA1C 10.5 (H) 09/06/2023    Lab Results  Component Value Date   CREATININE 0.92 09/06/2023   BUN 21 09/06/2023   NA 138 09/06/2023   K 4.9 09/06/2023   CL 102 09/06/2023   CO2 28 09/06/2023    Lab  Results  Component Value Date   CHOL 234 (H) 09/06/2023   HDL 55.40 09/06/2023   LDLCALC 162 (H) 09/06/2023   TRIG 83.0 09/06/2023   CHOLHDL 4 09/06/2023    Medications Reviewed Today     Reviewed by Henrene Pastor, RPH-CPP (Pharmacist) on 09/28/23 at 1102  Med List Status: <None>   Medication Order Taking? Sig Documenting Provider Last Dose Status Informant  albuterol (VENTOLIN HFA) 108 (90 Base) MCG/ACT inhaler [id number] Yes Inhale 2 puffs into the lungs every 6 (six) hours as needed for wheezing or shortness of breath. Jacky Kindle, FNP Taking Active   amLODipine-benazepril (LOTREL) 5-20 MG capsule [id number] Yes Take 1 capsule by mouth once daily Drubel, Lillia Abed, PA-C Taking Active   atenolol (TENORMIN) 50 MG tablet [id number] Yes Take 1 tablet by mouth once daily Jacky Kindle, FNP Taking Active   Blood Glucose Monitoring Suppl (CONTOUR NEXT USB MONITOR) w/Device KIT [id number] No 1 kit by Does not apply route daily. To check blood sugar once daily.  Patient not taking: Reported on 09/28/2023   Reine Just Not Taking Active   CareTouch Safety Lancets 26G MISC [id number] No 1 Device by Does not apply route daily. To check blood sugar once daily.  Patient not taking: Reported on 09/28/2023   Margaretann Loveless, PA-C Not Taking Active   cetirizine (ZYRTEC) 10 MG tablet [id number] Yes Take 1 tablet (10 mg total) by mouth daily. [name], PA-C Taking Active   Continuous Glucose Sensor (FREESTYLE LIBRE 3 PLUS SENSOR) MISC [id number] Yes Change sensor every 15 days. [name], PA-C Taking Active   dapagliflozin propanediol (FARXIGA) 10 MG TABS tablet [id number] Yes Take 1 tablet (10 mg total) by mouth daily. Patient receives through [place] Patient Assistance [name], PA-C Taking Active            Med Note Clydie Braun, Darcie Mellone B   Fri Jul 08, 2023 12:00 PM) [place] [employer] [place] medication [place] thru 10/18/2023  ezetimibe (ZETIA) 10 MG tablet [id number] Yes Take 1  tablet (10 mg total) by mouth daily. [name], PA-C Taking Active   fluticasone (FLONASE) 50 MCG/ACT nasal spray [id number] Yes Place 2 sprays into both nostrils daily. Jacky Kindle, FNP Taking Active   glucose blood (CONTOUR NEXT TEST) test strip [id number] No To check blood sugar once daily.  Patient not taking: Reported on 09/28/2023   Margaretann Loveless, PA-C Not Taking Active   insulin degludec ([name] FLEXTOUCH) 100 UNIT/ML FlexTouch Pen [id number] Yes Inject into the skin 2 (two) times daily. 24 units each morning and 22 units each evening [provider] Taking Active            Med Note Clydie Braun, Secundino Ellithorpe B   Fri Jul 08, 2023 11:15 AM) Gets from [employer] MAP  Insulin Pen Needle (NOVOFINE PLUS) 32G X 4 MM MISC [id number] Yes To use with ozempic injections Margaretann Loveless, PA-C Taking Active   montelukast (SINGULAIR) 10 MG tablet [id number] Yes Take  1 tablet (10 mg total) by mouth at bedtime. [name], PA-C Taking Active   omeprazole (PRILOSEC) 20 MG capsule [id number] Yes Take 20 mg by mouth daily. [provider] Taking Active Self           Med Note Clydie Braun, Cindie Laroche Sep 08, 2023 11:21 AM) Purchases OTC  rosuvastatin (CRESTOR) 40 MG tablet [id number] No Take 1 tablet (40 mg total) by mouth daily.  Patient not taking: Reported on 09/28/2023   [name], PA-C Not Taking Active   traZODone (DESYREL) 50 MG tablet [id number] Yes Take 0.5 tablets (25 mg total) by mouth at bedtime as needed for sleep. [name], PA-C Taking Active               Assessment/Plan:   Diabetes: blood glucose and A1c not at goal but blood glucose per Continuous Glucose Monitor report shows blood glucose recently has been better than A1c shows.  - Reviewed goal A1c, goal fasting, and goal 2 hour post prandial glucose - Reviewed dietary modifications including: limiting intake of food high in carbohydrates.  - Continue  [name] U100  - 24 units each morning  and 22 units each evening (will have her hold this evenings dose tonight just to see if blood glucose profile is similar to yesterday - plan to check back in tomorrow)  - Continue Farxiga 10mg  daily.  - Since patient is not able to take GLP agents due to past pancreatitis event, might need to consider prandial insulin in future.  - Reminded to check glucose on phone the [facility] system prior to meals at least.     Hyperlipidemia/ASCVD Risk Reduction: Last LDL was not at goal. Patient stopped rosuvastatin because she felt was affecting her memory.  - recommend patient retry a different statin  -pravastatin 80mg  might be a good option since is has lower reports of memory changes compared to atorvastatin and simvastatin. Will discussed with PCP at January appointment  Follow Up Plan: 1 to 2 days by phone to review Continuous Glucose Monitor report.   Henrene Pastor, PharmD Clinical Pharmacist [facility]

## 2023-09-30 ENCOUNTER — Other Ambulatory Visit: Payer: Self-pay | Admitting: Pharmacist

## 2023-09-30 ENCOUNTER — Encounter: Payer: Self-pay | Admitting: Pharmacist

## 2023-09-30 DIAGNOSIS — E1165 Type 2 diabetes mellitus with hyperglycemia: Secondary | ICD-10-CM

## 2023-09-30 MED ORDER — DAPAGLIFLOZIN PROPANEDIOL 10 MG PO TABS: 10.0000 mg | ORAL_TABLET | Freq: Every day | ORAL | 3 refills | Status: DC

## 2023-09-30 NOTE — Progress Notes (Signed)
09/30/2023 Name: Shari Lee MRN: [id number] DOB: 30-Dec-1955  Chief Complaint  Patient presents with   Diabetes    Shari Lee is a 67 y.o. year old female who presented for a telephone visit.   They were referred to the pharmacist by their PCP for assistance in managing diabetes, medication access, and complex medication management.   Subjective:  Medication Access/Adherence  Current Pharmacy:  [facility] [address]), [place] - [address] [place]) [place] [address] Phone: [phone] Fax: [phone]  [facility] - [place], [place] - [address] [place] [address] Phone: [phone] Fax: [phone]   Patient reports affordability concerns with their medications: No  - She is getting [name], pen needles and Farxiga from medication assistance program  Patient reports access/transportation concerns to their pharmacy: No  Patient reports adherence concerns with their medications:  No     Diabetes:  Current medications:  [name] 10mg  daily - getting from [facility] and Me patient assistance program  [name] 24 units each morning and 22 units each evening  - but she has held for the last 3 days because she has a low over night on 12/10. Getting from [employer] medication assistance program along with pen needles  Medications tried in the past: Ozempic - stopped due to pancreatitis; metformin - worsened IBS related diarrhea        Date of Download: 09/30/2023  % Time CGM is active: 92% (previously 96%) Average Glucose: 217 mg/dL (previously 829 mg/dL) Glucose Management Indicator: 8.5% (preciously was 8.5%) Glucose Variability: 33.0% (goal <36%) (previously was 33.4%) Time in Goal:  - Time in range 70-180: 32% (previously 32%) - Time above range: 68% ( previously 68%) - Time below range: 0% ( previously 0%)  Observed patterns:    Patient has lows earlier in the week. She did not  have evening dose of [name] (22 units) x last 3 nights. Blood glucose is trending upward in later half of day.     Current meal patterns:  Usually only eats 2 meals per day - Breakfast (around 10 to 11am): hash brown, ham biscuit - goes to [employer] most mornings.  - Lunch: usually nothing. - Supper: eat from 5 to 7pm - eats out at [employer] - roast beef, mashed potatoes, macaroni and cheese. She is trying to include fewer high CHO foods and more low CHO vegetables.  - Snacks: sometimes snacks on cashews, walnuts - Drinks: water, sometimes half and half tea.  Doesn't cook much since it just her at home.    Objective:  Lab Results  Component Value Date   HGBA1C 10.5 (H) 09/06/2023    Lab Results  Component Value Date   CREATININE 0.92 09/06/2023   BUN 21 09/06/2023   NA 138 09/06/2023   K 4.9 09/06/2023   CL 102 09/06/2023   CO2 28 09/06/2023    Lab Results  Component Value Date   CHOL 234 (H) 09/06/2023   HDL 55.40 09/06/2023   LDLCALC 162 (H) 09/06/2023   TRIG 83.0 09/06/2023   CHOLHDL 4 09/06/2023    Medications Reviewed Today   Medications were not reviewed in this encounter       Assessment/Plan:   Diabetes: blood glucose and A1c not at goal but blood glucose per Continuous Glucose Monitor report shows blood glucose recently has been better than A1c shows.  - Reviewed goal A1c, goal fasting, and goal  2 hour post prandial glucose - Reviewed dietary modifications including: limiting intake of food high in carbohydrates.  - Continue  [name] U100  - 24 units each morning but restart evening dose at 10 units each evening (had held evening dose for a few days due to lows earlier in the week)    - Continue Farxiga 10mg  daily.  - Since patient is not able to take GLP agents due to past pancreatitis event, might need to consider prandial insulin in future.  - Assisted patient with applying for 2025 medication assistance program program for [name] and pen  needles. Patient portion was approved. Completed provider portion and will forward to PCP to review and sign.  - Also called [place] [employer] [place] - patient's re-enrollment is pending. Assisted in getting link for re-enrollment sent to patient by text message. She will complete on her own later.   Patient will need updated Rx for Farxiga sent to [facility] - will request from PCP.    Follow Up Plan: 1 to 2 weeks by phone to review Continuous Glucose Monitor report.   Henrene Pastor, PharmD Clinical Pharmacist [facility]

## 2023-09-30 NOTE — Addendum Note (Signed)
Addended by: Henrene Pastor B on: 09/30/2023 06:34 PM   Modules accepted: Orders

## 2023-09-30 NOTE — Telephone Encounter (Signed)
Patient needs updated Rx sent to [facility] for 2025 medication assistance program with [place] [employer] [place].

## 2023-10-20 ENCOUNTER — Ambulatory Visit (INDEPENDENT_AMBULATORY_CARE_PROVIDER_SITE_OTHER): Payer: Medicare Other | Admitting: Physician Assistant

## 2023-10-20 VITALS — BP 124/80 | HR 62 | Temp 98.1°F | Ht 61.0 in | Wt 205.2 lb

## 2023-10-20 DIAGNOSIS — E785 Hyperlipidemia, unspecified: Secondary | ICD-10-CM | POA: Diagnosis not present

## 2023-10-20 DIAGNOSIS — E1165 Type 2 diabetes mellitus with hyperglycemia: Secondary | ICD-10-CM | POA: Diagnosis not present

## 2023-10-20 DIAGNOSIS — E1169 Type 2 diabetes mellitus with other specified complication: Secondary | ICD-10-CM | POA: Diagnosis not present

## 2023-10-20 DIAGNOSIS — Z794 Long term (current) use of insulin: Secondary | ICD-10-CM | POA: Diagnosis not present

## 2023-10-20 DIAGNOSIS — Z1231 Encounter for screening mammogram for malignant neoplasm of breast: Secondary | ICD-10-CM

## 2023-10-20 DIAGNOSIS — R39198 Other difficulties with micturition: Secondary | ICD-10-CM | POA: Diagnosis not present

## 2023-10-20 LAB — LIPID PANEL
Cholesterol: 226 mg/dL — ABNORMAL HIGH (ref 0–200)
HDL: 77.5 mg/dL (ref >39.00)
LDL Cholesterol: 135 mg/dL — ABNORMAL HIGH (ref 0–99)
NonHDL: 148.3
Total CHOL/HDL Ratio: 3
Triglycerides: 66 mg/dL (ref 0.0–149.0)
VLDL: 13.2 mg/dL (ref 0.0–40.0)

## 2023-10-20 LAB — COMPREHENSIVE METABOLIC PANEL
ALT: 16 U/L (ref 0–35)
AST: 14 U/L (ref 0–37)
Albumin: 4.2 g/dL (ref 3.5–5.2)
Alkaline Phosphatase: 91 U/L (ref 39–117)
BUN: 26 mg/dL — ABNORMAL HIGH (ref 6–23)
CO2: 28 meq/L (ref 19–32)
Calcium: 9.6 mg/dL (ref 8.4–10.5)
Chloride: 103 meq/L (ref 96–112)
Creatinine, Ser: 1 mg/dL (ref 0.40–1.20)
GFR: 58.58 mL/min — ABNORMAL LOW (ref >60.00)
Glucose, Bld: 107 mg/dL — ABNORMAL HIGH (ref 70–99)
Potassium: 5.2 meq/L — ABNORMAL HIGH (ref 3.5–5.1)
Sodium: 139 meq/L (ref 135–145)
Total Bilirubin: 0.4 mg/dL (ref 0.2–1.2)
Total Protein: 6.8 g/dL (ref 6.0–8.3)

## 2023-10-20 LAB — HEMOGLOBIN A1C: Hgb A1c MFr Bld: 9.7 % — ABNORMAL HIGH (ref 4.6–6.5)

## 2023-10-20 NOTE — Progress Notes (Signed)
 Established patient visit   Patient: Shari Lee   DOB: 14-May-1956   68 y.o. Female  MRN: [id number] Visit Date: 10/20/2023  Today's healthcare provider: Manuelita Flatness, PA-C   Chief Complaint  Patient presents with   Medical Management of Chronic Issues    Patient states things are fine, no concerns   Subjective    Diabetes Mellitus Type II, Follow-up  Lab Results  Component Value Date   HGBA1C 9.7 (H) 10/20/2023   HGBA1C 10.5 (H) 09/06/2023   HGBA1C 7.8 (H) 03/21/2023   Wt Readings from Last 3 Encounters:  10/20/23 205 lb 4 oz (93.1 kg)  09/06/23 199 lb 3.2 oz (90.4 kg)  07/05/23 196 lb 12.8 oz (89.3 kg)   Home blood sugar records: now, low fasting, < 130, but high afternoon- evening, 180s-230s.  Episodes of hypoglycemia? noo   Current insulin  regiment:  24 U in AM , 10 U in PM. Recently decreased d/t a low bs day to avoid hypoglycemia.   Pertinent Labs: Lab Results  Component Value Date   CHOL 226 (H) 10/20/2023   HDL 77.50 10/20/2023   LDLCALC 135 (H) 10/20/2023   TRIG 66.0 10/20/2023   CHOLHDL 3 10/20/2023   Lab Results  Component Value Date   NA 139 10/20/2023   K 5.2 No hemolysis seen (H) 10/20/2023   CREATININE 1.00 10/20/2023   GFRNONAA >60 10/13/2020   MICRALBCREAT 17 12/17/2022     --------------------------------------------------------------------------------------------------- Pt is interested in starting a carnivore diet to help manage her diabetes.  In addition to their diabetes, the patient also reports urinary issues. They describe a need to lean forward to fully empty their bladder. They deny any pain or discomfort associated with urination.  Medications: Outpatient Medications Prior to Visit  Medication Sig   albuterol  (VENTOLIN  HFA) 108 (90 Base) MCG/ACT inhaler Inhale 2 puffs into the lungs every 6 (six) hours as needed for wheezing or shortness of breath.   amLODipine -benazepril  (LOTREL) 5-20 MG capsule Take 1 capsule by  mouth once daily   atenolol  (TENORMIN ) 50 MG tablet Take 1 tablet by mouth once daily   Blood Glucose Monitoring Suppl (CONTOUR NEXT USB MONITOR) w/Device KIT 1 kit by Does not apply route daily. To check blood sugar once daily.   CareTouch Safety Lancets 26G MISC 1 Device by Does not apply route daily. To check blood sugar once daily.   cetirizine  (ZYRTEC ) 10 MG tablet Take 1 tablet (10 mg total) by mouth daily.   Continuous Glucose Sensor (FREESTYLE LIBRE 3 PLUS SENSOR) MISC Change sensor every 15 days.   dapagliflozin  propanediol (FARXIGA ) 10 MG TABS tablet Take 1 tablet (10 mg total) by mouth daily. For 2025 [place] Patient Assistance Program   ezetimibe  (ZETIA ) 10 MG tablet Take 1 tablet (10 mg total) by mouth daily.   fluticasone  (FLONASE ) 50 MCG/ACT nasal spray Place 2 sprays into both nostrils daily.   glucose blood (CONTOUR NEXT TEST) test strip To check blood sugar once daily.   insulin  degludec ([name]  FLEXTOUCH) 100 UNIT/ML FlexTouch Pen Inject into the skin 2 (two) times daily. 24 units each morning and 22 units each evening   Insulin  Pen Needle (NOVOFINE PLUS) 32G X 4 MM MISC To use with ozempic  injections   montelukast  (SINGULAIR ) 10 MG tablet Take 1 tablet (10 mg total) by mouth at bedtime.   omeprazole  (PRILOSEC ) 20 MG capsule Take 20 mg by mouth daily.   rosuvastatin  (CRESTOR ) 40 MG tablet Take 1 tablet (40 mg  total) by mouth daily.   traZODone  (DESYREL ) 50 MG tablet Take 0.5 tablets (25 mg total) by mouth at bedtime as needed for sleep.   No facility-administered medications prior to visit.    Review of Systems  Constitutional:  Negative for fatigue and fever.  Respiratory:  Negative for cough and shortness of breath.   Cardiovascular:  Negative for chest pain and leg swelling.  Gastrointestinal:  Negative for abdominal pain.  Genitourinary:  Positive for decreased urine volume.  Neurological:  Negative for dizziness and headaches.       Objective    BP 124/80    Pulse 62   Temp 98.1 F (36.7 C) (Oral)   Ht 5' 1 (1.549 m)   Wt 205 lb 4 oz (93.1 kg)   SpO2 100%   BMI 38.78 kg/m    Physical Exam Vitals reviewed.  Constitutional:      Appearance: She is not ill-appearing.  HENT:     Head: Normocephalic.  Eyes:     Conjunctiva/sclera: Conjunctivae normal.  Cardiovascular:     Rate and Rhythm: Normal rate.     Pulses:          Dorsalis pedis pulses are 3+ on the right side and 3+ on the left side.       Posterior tibial pulses are 3+ on the right side and 3+ on the left side.  Pulmonary:     Effort: Pulmonary effort is normal. No respiratory distress.  Feet:     Right foot:     Protective Sensation: 4 sites tested.  4 sites sensed.     Skin integrity: Skin integrity normal.     Toenail Condition: Right toenails are normal.     Left foot:     Protective Sensation: 4 sites tested.  4 sites sensed.     Skin integrity: Skin integrity normal.     Comments: Left pinky toenail is slightly torn. Dried blood present. No erythema or discharge Neurological:     General: No focal deficit present.     Mental Status: She is alert and oriented to person, place, and time.  Psychiatric:        Mood and Affect: Mood normal.        Behavior: Behavior normal.      Results for orders placed or performed in visit on 10/20/23  Comp Met (CMET)  Result Value Ref Range   Sodium 139 135 - 145 mEq/L   Potassium 5.2 No hemolysis seen (H) 3.5 - 5.1 mEq/L   Chloride 103 96 - 112 mEq/L   CO2 28 19 - 32 mEq/L   Glucose, Bld 107 (H) 70 - 99 mg/dL   BUN 26 (H) 6 - 23 mg/dL   Creatinine, Ser 8.99 0.40 - 1.20 mg/dL   Total Bilirubin 0.4 0.2 - 1.2 mg/dL   Alkaline Phosphatase 91 39 - 117 U/L   AST 14 0 - 37 U/L   ALT 16 0 - 35 U/L   Total Protein 6.8 6.0 - 8.3 g/dL   Albumin  4.2 3.5 - 5.2 g/dL   GFR 41.41 (L) >39.99 mL/min   Calcium  9.6 8.4 - 10.5 mg/dL  HgB J8r  Result Value Ref Range   Hgb A1c MFr Bld 9.7 (H) 4.6 - 6.5 %  Lipid panel  Result Value  Ref Range   Cholesterol 226 (H) 0 - 200 mg/dL   Triglycerides 33.9 0.0 - 149.0 mg/dL   HDL 22.49 >60.99 mg/dL   VLDL 86.7 0.0 -  40.0 mg/dL   LDL Cholesterol 864 (H) 0 - 99 mg/dL   Total CHOL/HDL Ratio 3    NonHDL 148.30     Assessment & Plan    Type 2 diabetes mellitus with hyperglycemia, with long-term current use of insulin  (HCC) Assessment & Plan: Last A1c significantly elevated at 10.5%. Will repeat today Current insulin  regimen 24 U am and 10 U pm, pm dose decreased 2/2 a hypoglycemic day. Given latest pattern, recommending up by 2 U daily to a PM dose of 18 U while monitoring sugar. Cont farxiga  10 mg.  H/o d/c sulfonylurea 2023 2/2 hypoglyemia ( pt was well controlled at the time), d/c ozempic  2/2 pancreatitis.   Uacr utd, no longer on statin, pt did not tolerate, on acei Foot exam completed today  Orders: -     Comprehensive metabolic panel -     Hemoglobin A1c  Hyperlipidemia associated with type 2 diabetes mellitus (HCC) Assessment & Plan: Pt d/c statin 2/2 to memory changes. Now on zetia .  Will repeat lipid panel, goal < 70  Pt is apprehensive to restart a statin .   Orders: -     Lipid panel  Abnormal urinary stream Recommend pt monitor. Likely 2/2 pelvic floor weakness If worsens would refer to pelvic PT  Breast cancer screening by mammogram -     3D Screening Mammogram, Left and Right   Return in about 4 months (around 02/17/2024) for DMII.       Manuelita Flatness, PA-C  [facility] [address] [phone] (phone) [phone] (fax)  [facility]

## 2023-10-21 ENCOUNTER — Other Ambulatory Visit: Payer: Self-pay | Admitting: Physician Assistant

## 2023-10-21 ENCOUNTER — Encounter: Payer: Self-pay | Admitting: Physician Assistant

## 2023-10-21 DIAGNOSIS — E875 Hyperkalemia: Secondary | ICD-10-CM

## 2023-10-21 NOTE — Assessment & Plan Note (Signed)
 Pt d/c statin 2/2 to memory changes. Now on zetia.  Will repeat lipid panel, goal < 70  Pt is apprehensive to restart a statin .

## 2023-10-21 NOTE — Assessment & Plan Note (Signed)
 Last A1c significantly elevated at 10.5%. Will repeat today Current insulin  regimen 24 U am and 10 U pm, pm dose decreased 2/2 a hypoglycemic day. Given latest pattern, recommending up by 2 U daily to a PM dose of 18 U while monitoring sugar. Cont farxiga  10 mg.  H/o d/c sulfonylurea 2023 2/2 hypoglyemia ( pt was well controlled at the time), d/c ozempic  2/2 pancreatitis.   Uacr utd, no longer on statin, pt did not tolerate, on acei Foot exam completed today

## 2023-10-24 ENCOUNTER — Encounter: Payer: Self-pay | Admitting: Pharmacist

## 2023-10-24 ENCOUNTER — Telehealth: Payer: Medicare Other

## 2023-10-24 ENCOUNTER — Ambulatory Visit (INDEPENDENT_AMBULATORY_CARE_PROVIDER_SITE_OTHER): Payer: Medicare Other | Admitting: Pharmacist

## 2023-10-24 DIAGNOSIS — Z794 Long term (current) use of insulin: Secondary | ICD-10-CM

## 2023-10-24 DIAGNOSIS — E1165 Type 2 diabetes mellitus with hyperglycemia: Secondary | ICD-10-CM

## 2023-10-24 NOTE — Progress Notes (Signed)
 10/24/2023 Name: Shari Lee MRN: [id number] DOB: 09/09/1956  Chief Complaint  Patient presents with   Diabetes    Shari Lee is a 68 y.o. year old female who presented for a telephone visit.   They were referred to the pharmacist by their PCP for assistance in managing diabetes, medication access, and complex medication management.   Subjective:  Medication Access/Adherence  Current Pharmacy:  [facility] [address]), [place] - [address] [place]) [place] [address] Phone: [phone] Fax: [phone]  [facility] - [place], [place] - [address] [place] [address] Phone: [phone] Fax: [phone]   Patient reports affordability concerns with their medications: No  - She is getting [name] , pen needles and Farxiga  from medication assistance program  Patient reports access/transportation concerns to their pharmacy: No  Patient reports adherence concerns with their medications:  No     Diabetes:  Current medications:  Farxiga  10mg  daily - [place] from [place]  [name]  24 units each morning and 22 units each evening  - but she has held for the last 3 days because she has a low over night on 12/10. Getting from [employer] medication assistance program along with pen needles  Medications tried in the past: Ozempic  - stopped due to pancreatitis; metformin  - worsened IBS related diarrhea    Date of Download: 10/24/2023 % Time CGM is active: 98%  Average Glucose: 163 mg/dL (previously 782 mg/dL) Glucose Management Indicator: 7.2% (preciously was 8.5%) Glucose Variability: 41.6% (goal <36%) (previously was 33.0%) Time in Goal:  - Time in range 70-180: 62% (previously 32%) - Time above range: 37% ( previously 68%) - Time below range: 1% ( previously 0%)  Observed patterns:    Patient has lows earlier in the week. She did not have evening dose of  [name]  (22 units) x last 3 nights. Blood glucose is trending upward in later half of day.       Current meal patterns:  Usually only eats 2 meals per day. She has started new diet - the [employer]. Eating more meat, cheese and nuts.  - Supper: steak + small baked potato - Snacks: sometimes snacks on cashews, walnuts - Drinks: water  only; reports she has stopped drinking sweetened beverages Doesn't cook much since it just her at home.    Objective:  Lab Results  Component Value Date   HGBA1C 9.7 (H) 10/20/2023    Lab Results  Component Value Date   CREATININE 1.00 10/20/2023   BUN 26 (H) 10/20/2023   NA 139 10/20/2023   K 5.2 No hemolysis seen (H) 10/20/2023   CL 103 10/20/2023   CO2 28 10/20/2023    Lab Results  Component Value Date   CHOL 226 (H) 10/20/2023   HDL 77.50 10/20/2023   LDLCALC 135 (H) 10/20/2023   TRIG 66.0 10/20/2023   CHOLHDL 3 10/20/2023    Medications Reviewed Today     Reviewed by Carla Milling, RPH-CPP (Pharmacist) on 10/24/23 at 1535  Med List Status: <None>   Medication Order Taking? Sig Documenting Provider Last Dose Status Informant  albuterol  (VENTOLIN  HFA) 108 (90 Base) MCG/ACT inhaler [id number] Yes Inhale 2 puffs into the lungs every 6 (six) hours as needed for wheezing or shortness of breath. Emilio Kelly DASEN, FNP Taking Active   amLODipine -benazepril  (LOTREL) 5-20 MG capsule [id number] Yes Take 1 capsule by mouth once daily Cyndi Shaver,  PA-C Taking Active   atenolol  (TENORMIN ) 50 MG tablet [id number] Yes Take 1 tablet by mouth once daily Emilio Kelly DASEN, FNP Taking Active   Blood Glucose Monitoring Suppl (CONTOUR NEXT USB MONITOR) w/Device KIT [id number]  1 kit by Does not apply route daily. To check blood sugar once daily. Vivienne Delon HERO, PA-C  Active   CareTouch Safety Lancets 26G MISC [id number]  1 Device by Does not apply route daily. To check blood sugar once daily. Vivienne Delon M, PA-C  Active   cetirizine  (ZYRTEC ) 10  MG tablet [id number]  Take 1 tablet (10 mg total) by mouth daily. Cyndi Shaver, PA-C  Active   Continuous Glucose Sensor (FREESTYLE LIBRE 3 PLUS SENSOR) MISC [id number] Yes Change sensor every 15 days. Cyndi Shaver, PA-C Taking Active   dapagliflozin  propanediol (FARXIGA ) 10 MG TABS tablet [id number]  Take 1 tablet (10 mg total) by mouth daily. For 2025 [place] Program [place], [place], [place]  Active            Med Note [place], [place] B   Mon Oct 24, 2023  3:33 PM) [place] and [place] thru 10/17/2024  ezetimibe  (ZETIA ) 10 MG tablet [id number] Yes Take 1 tablet (10 mg total) by mouth daily. Cyndi Shaver, PA-C Taking Active   fluticasone  (FLONASE ) 50 MCG/ACT nasal spray [id number]  Place 2 sprays into both nostrils daily. Emilio Kelly DASEN, FNP  Active   glucose blood (CONTOUR NEXT TEST) test strip [id number]  To check blood sugar once daily. Vivienne Delon M, PA-C  Active   insulin  degludec ([name]  FLEXTOUCH) 100 UNIT/ML FlexTouch Pen [id number] Yes Inject into the skin 2 (two) times daily. 24 units each morning and 22 units each evening [provider] Taking Active            Med Note JUSTINO, Amaury Kuzel B   Fri Jul 08, 2023 11:15 AM) Gets from [employer] MAP  Insulin  Pen Needle (NOVOFINE PLUS) 32G X 4 MM MISC [id number] Yes To use with ozempic  injections Burnette, Jennifer M, PA-C Taking Active   montelukast  (SINGULAIR ) 10 MG tablet [id number] Yes Take 1 tablet (10 mg total) by mouth at bedtime. Cyndi Shaver, PA-C Taking Active   omeprazole  (PRILOSEC ) 20 MG capsule [id number]  Take 20 mg by mouth daily. [provider]  Active Self           Med Note JUSTINO, Shari KATHEE Schaumann Sep 08, 2023 11:21 AM) Purchases OTC  rosuvastatin  (CRESTOR ) 40 MG tablet [id number] No Take 1 tablet (40 mg total) by mouth daily.  Patient not taking: Reported on 10/24/2023   Cyndi Shaver, PA-C Not Taking Active   traZODone  (DESYREL ) 50 MG tablet [id number] No Take 0.5  tablets (25 mg total) by mouth at bedtime as needed for sleep.  Patient not taking: Reported on 10/24/2023   Cyndi Shaver, PA-C Not Taking Active              Assessment/Plan:   Diabetes: blood glucose and A1c not at goal but improving - Reviewed goal A1c, goal fasting, and goal 2 hour post prandial glucose - Reviewed dietary modifications; Discussed pros and cons of carnivore diet.  - Continue to hold [name]  U100  - 24 units each morning and 14 units each evening.  - Since patient is not able to take GLP agents due to past pancreatitis event, might need to consider prandial insulin  in future.  - Called [employer] - verified patient has been approved to receive  [name]  and pen needles 10/19/2023 thru 10/17/2024. Her first delivery is in progress with expected delivery date of around 11/08/2023.  - Called [place] and [place] to verify Rx for Farxiga  from December 2024 was received and patient should receive her first fill / delivery around 11/09/2023.   Follow Up Plan: 1 to 2 weeks by phone to review Continuous Glucose Monitor report.   Shari Lee, PharmD Clinical Pharmacist [facility]

## 2023-11-01 ENCOUNTER — Other Ambulatory Visit (INDEPENDENT_AMBULATORY_CARE_PROVIDER_SITE_OTHER): Payer: Medicare Other

## 2023-11-01 ENCOUNTER — Other Ambulatory Visit: Payer: Self-pay | Admitting: Physician Assistant

## 2023-11-01 ENCOUNTER — Encounter: Payer: Self-pay | Admitting: Physician Assistant

## 2023-11-01 DIAGNOSIS — E875 Hyperkalemia: Secondary | ICD-10-CM

## 2023-11-01 LAB — BASIC METABOLIC PANEL
BUN: 40 mg/dL — ABNORMAL HIGH (ref 6–23)
CO2: 21 meq/L (ref 19–32)
Calcium: 9.9 mg/dL (ref 8.4–10.5)
Chloride: 104 meq/L (ref 96–112)
Creatinine, Ser: 1.15 mg/dL (ref 0.40–1.20)
GFR: 49.53 mL/min — ABNORMAL LOW (ref >60.00)
Glucose, Bld: 141 mg/dL — ABNORMAL HIGH (ref 70–99)
Potassium: 5.4 meq/L — ABNORMAL HIGH (ref 3.5–5.1)
Sodium: 137 meq/L (ref 135–145)

## 2023-11-01 NOTE — Telephone Encounter (Signed)
   11/01/2023 Name: Shari Lee MRN: [id number] DOB: [birth year]  CC: diabetes  Subjective:  Patient reached out to Clinical Pharmacist Practitioner thru MyChart to review most recent Continuous Glucose Monitor report. See report below.  Patient also reported that she has not taken any insulin  in the last 8 days. She has continues to take Farxiga  10mg  once a day  She has been following a carnivore type / low CHO diet diet the last week.   CGM Documentation: (report dates 10/19/2023 thru 11/01/2023) Days Worn: 14 (recommend 14 days) % Time CGM is active: 98% (goal >=70%) Average Glucose: 130 mg/dL Glucose Management Indicator: 6.4% Glucose Variability: 37.7% (goal <36%) Time in Range:  - Time above range >250: 5% (typical goal: <5%) - Time above range 181-250: 7% (typical goal <20%) - Time in range 70-180 87% (typical goal >=70%) - Time below range 54-69: 1% (typical goal <4%) - Time below range: 0% (typical goal <1%)           Assessment - Type 2 DM - patient was previously using insulin  and Farxiga  but since she made dietary changes she has not been taking insulin  and blood glucose has been great.   Plan:  Continue to take Farxiga  10mg  daily  Continue to use Continuous Glucose Monitor to check blood glucose - if blood glucose increases to >180, recommend she contact office for instruction on how to restart [name]  (suspect even if she needed [name]  it would be a lower dose that she previously was taking) Also recommended is she had a fingerstick blood glucose monitor at home to just do a double check to verify Continuous Glucose Monitor.    [name], PharmD Clinical Pharmacist [facility]

## 2023-11-04 ENCOUNTER — Encounter: Payer: Self-pay | Admitting: Physician Assistant

## 2023-11-04 ENCOUNTER — Other Ambulatory Visit: Payer: Medicare Other

## 2023-11-04 ENCOUNTER — Ambulatory Visit (INDEPENDENT_AMBULATORY_CARE_PROVIDER_SITE_OTHER): Payer: Medicare Other | Admitting: Physician Assistant

## 2023-11-04 ENCOUNTER — Other Ambulatory Visit (INDEPENDENT_AMBULATORY_CARE_PROVIDER_SITE_OTHER): Payer: Medicare Other

## 2023-11-04 VITALS — BP 128/78 | HR 67 | Temp 97.9°F | Ht 61.0 in | Wt 200.4 lb

## 2023-11-04 DIAGNOSIS — J069 Acute upper respiratory infection, unspecified: Secondary | ICD-10-CM | POA: Diagnosis not present

## 2023-11-04 DIAGNOSIS — E875 Hyperkalemia: Secondary | ICD-10-CM | POA: Diagnosis not present

## 2023-11-04 DIAGNOSIS — R0600 Dyspnea, unspecified: Secondary | ICD-10-CM | POA: Diagnosis not present

## 2023-11-04 LAB — COMPREHENSIVE METABOLIC PANEL
ALT: 19 U/L (ref 0–35)
AST: 18 U/L (ref 0–37)
Albumin: 4.4 g/dL (ref 3.5–5.2)
Alkaline Phosphatase: 82 U/L (ref 39–117)
BUN: 29 mg/dL — ABNORMAL HIGH (ref 6–23)
CO2: 20 meq/L (ref 19–32)
Calcium: 9.6 mg/dL (ref 8.4–10.5)
Chloride: 105 meq/L (ref 96–112)
Creatinine, Ser: 0.93 mg/dL (ref 0.40–1.20)
GFR: 63.89 mL/min (ref >60.00)
Glucose, Bld: 116 mg/dL — ABNORMAL HIGH (ref 70–99)
Potassium: 5.1 meq/L (ref 3.5–5.1)
Sodium: 138 meq/L (ref 135–145)
Total Bilirubin: 0.4 mg/dL (ref 0.2–1.2)
Total Protein: 7.1 g/dL (ref 6.0–8.3)

## 2023-11-04 LAB — BRAIN NATRIURETIC PEPTIDE: Pro B Natriuretic peptide (BNP): 80 pg/mL (ref 0.0–100.0)

## 2023-11-04 LAB — MAGNESIUM: Magnesium: 2.2 mg/dL (ref 1.5–2.5)

## 2023-11-04 NOTE — Progress Notes (Signed)
Established patient visit   Patient: Shari Lee   DOB: 18-Sep-1956   68 y.o. Female  MRN: [id number] Visit Date: 11/04/2023  Today's healthcare provider: [name], PA-C   Chief Complaint  Patient presents with   URI    Started last Sunday- no fever. Headaches- no cough. Sinus feels dry.  No energy Legs are jello-   Subjective    Pt reports starting Sunday, x 5 day ago, that she started feeling fatigued, weak, nasal congestion, and dyspnea w/ exertion. She denies a cough or SOB at rest.  She reports it was more difficult to stand in church than usual.  Over the last few weeks she has dramatically changed her diet eating a 'carnivore' diet. From a brief review, she eats 2-3 times a day, small meals mainly consisting of meat/protein.  This has caused her glucose to drop, she has dramatically lowered her insulin dosage.  Medications: Outpatient Medications Prior to Visit  Medication Sig   albuterol (VENTOLIN HFA) 108 (90 Base) MCG/ACT inhaler Inhale 2 puffs into the lungs every 6 (six) hours as needed for wheezing or shortness of breath.   amLODipine-benazepril (LOTREL) 5-20 MG capsule Take 1 capsule by mouth once daily   atenolol (TENORMIN) 50 MG tablet Take 1 tablet by mouth once daily   Blood Glucose Monitoring Suppl (CONTOUR NEXT USB MONITOR) w/Device KIT 1 kit by Does not apply route daily. To check blood sugar once daily.   CareTouch Safety Lancets 26G MISC 1 Device by Does not apply route daily. To check blood sugar once daily.   cetirizine (ZYRTEC) 10 MG tablet Take 1 tablet (10 mg total) by mouth daily.   Continuous Glucose Sensor (FREESTYLE LIBRE 3 PLUS SENSOR) MISC Change sensor every 15 days.   dapagliflozin propanediol (FARXIGA) 10 MG TABS tablet Take 1 tablet (10 mg total) by mouth daily. For 2025 [place] Patient Assistance Program   ezetimibe (ZETIA) 10 MG tablet Take 1 tablet (10 mg total) by mouth daily.   fluticasone (FLONASE) 50 MCG/ACT nasal spray  Place 2 sprays into both nostrils daily.   glucose blood (CONTOUR NEXT TEST) test strip To check blood sugar once daily.   insulin degludec ([name] FLEXTOUCH) 100 UNIT/ML FlexTouch Pen Inject into the skin 2 (two) times daily. 24 units each morning and 22 units each evening   Insulin Pen Needle (NOVOFINE PLUS) 32G X 4 MM MISC To use with ozempic injections   montelukast (SINGULAIR) 10 MG tablet Take 1 tablet (10 mg total) by mouth at bedtime.   omeprazole (PRILOSEC) 20 MG capsule Take 20 mg by mouth daily.   rosuvastatin (CRESTOR) 40 MG tablet Take 1 tablet (40 mg total) by mouth daily.   traZODone (DESYREL) 50 MG tablet Take 0.5 tablets (25 mg total) by mouth at bedtime as needed for sleep.   No facility-administered medications prior to visit.    Review of Systems  Constitutional:  Positive for fatigue. Negative for fever.  HENT:  Positive for congestion.   Respiratory:  Negative for cough and shortness of breath.   Cardiovascular:  Negative for chest pain and leg swelling.  Gastrointestinal:  Negative for abdominal pain.  Neurological:  Negative for dizziness and headaches.       Objective    BP 128/78   Pulse 67   Temp 97.9 F (36.6 C) (Oral)   Ht 5\' 1"  (1.549 m)   Wt 200 lb 6 oz (90.9 kg)   SpO2 99%   BMI 37.86  kg/m    Physical Exam Constitutional:      General: She is awake.     Appearance: She is well-developed.  HENT:     Head: Normocephalic.     Right Ear: Tympanic membrane normal.     Left Ear: Tympanic membrane normal.     Mouth/Throat:     Pharynx: No oropharyngeal exudate or posterior oropharyngeal erythema.  Eyes:     Conjunctiva/sclera: Conjunctivae normal.  Cardiovascular:     Rate and Rhythm: Normal rate and regular rhythm.     Heart sounds: Normal heart sounds.  Pulmonary:     Effort: Pulmonary effort is normal.     Breath sounds: Normal breath sounds. No wheezing, rhonchi or rales.  Skin:    General: Skin is warm.  Neurological:     Mental  Status: She is alert and oriented to person, place, and time.  Psychiatric:        Attention and Perception: Attention normal.        Mood and Affect: Mood normal.        Speech: Speech normal.        Behavior: Behavior is cooperative.     No results found for any visits on 11/04/23.  Assessment & Plan    Dyspnea, unspecified type -     Brain natriuretic peptide  Hyperkalemia -     Magnesium  Upper respiratory tract infection, unspecified type   Nasal congestion and weakness likely viral, advised saline nasal sprays, flonase, antihistamines.  Given mild hyperkal-- repeat labs today, ordering mag and d/t dyspnea, a BNP although pt appears euvolmeic. Pt reports she is drinking enough water throughout the day. If hyperkal and low GFR continues, consider referral to nephrology.  Im concerned about her rapid diet changes, sudden drop in blood sugar-- this could be contributing to her fatigue as well. I was concerned a carnivore diet would involve too much protein that may affect her kidney function, but on a diet review-- she isn't eating much at all throughout the day.  As always I stress a balanced diet, but pt is aware to closely monitor her BS. She has a f/u with Tammy w/ pharmacy on Monday.  Return if symptoms worsen or fail to improve.       [name], PA-C  [facility] [phone] (phone) [phone] (fax)  [facility]

## 2023-11-07 ENCOUNTER — Encounter: Payer: Self-pay | Admitting: Physician Assistant

## 2023-11-07 ENCOUNTER — Ambulatory Visit (INDEPENDENT_AMBULATORY_CARE_PROVIDER_SITE_OTHER): Payer: Medicare Other | Admitting: Pharmacist

## 2023-11-07 DIAGNOSIS — E1165 Type 2 diabetes mellitus with hyperglycemia: Secondary | ICD-10-CM

## 2023-11-07 DIAGNOSIS — Z794 Long term (current) use of insulin: Secondary | ICD-10-CM

## 2023-11-07 NOTE — Progress Notes (Signed)
11/07/2023 Name: Shari Lee MRN: [id number] DOB: [birth year]  Chief Complaint  Patient presents with   Diabetes    Shari Lee is a 68 y.o. year old female who presented for a telephone visit.   They were referred to the pharmacist by their PCP for assistance in managing diabetes, medication access, and complex medication management.   Subjective:  She has noticed increase in resting heart rate from 60's to 80's to 107 since she changed diet. You can sometimes see an increase in heart rate the first 1 to 2 weeks. She is also taking albuterol a little more often due to URI which also can increase heart rate.   Medication Access/Adherence  Current Pharmacy:  [facility] [address] ([address]), [place] - [address] [place]) [place] [address] Phone: [phone] Fax: [phone]  [facility] - [place], [place] - [address] [place] [address] Phone: [phone] Fax: [phone]   Patient reports affordability concerns with their medications: No   and Farxiga from medication assistance program  Patient reports access/transportation concerns to their pharmacy: No  Patient reports adherence concerns with their medications:  No     Diabetes:  Current medications:  [name] 10mg  daily - getting from [facility] and Me patient assistance program - approved thru 10/17/2024  She was previously taking [name] 24 units each morning and 22 units each evening  - but she has held for the last 2 weeks because she started keto diet and blood glucose was getting too low. She has been approved to get [name] and pen needles from [employer] medication assistance program along with pen needles for 2025.   Medications tried in the past: Ozempic - stopped due to pancreatitis; metformin - worsened IBS related diarrhea    Date of Download: 11/07/2023 % Time CGM is active: 98%  Average Glucose: 111 mg/dL (previously 643  mg/dL) Glucose Management Indicator: 6.0% (preciously was 7.2%) Glucose Variability: 15.6% (goal <36%) (previously was 41.6%) Time in Goal:  - Time in range 70-180: 100% (previously 62%) - Time above range: 0% ( previously 37%) - Time below range: 0% ( previously 1%)  Observed patterns:       PREVIOUS Continuous Glucose Monitor REPORT 10/11/2023 TO 10/24/2023:     Current meal patterns:  Usually only eats 2 meals per day. She has started new diet - Keto / Carnivore diet. Eating more meat, cheese and nuts.  - Breakfast: bacon, eggs, cheese.  - Supper: steak  - Snacks: sometimes snacks on cashews, walnuts - Drinks: water only; reports she has stopped drinking sweetened beverages. She has added electrolyte drink - liquid IV      Objective:  Lab Results  Component Value Date   HGBA1C 9.7 (H) 10/20/2023    Lab Results  Component Value Date   CREATININE 0.93 11/04/2023   BUN 29 (H) 11/04/2023   NA 138 11/04/2023   K 5.1 11/04/2023   CL 105 11/04/2023   CO2 20 11/04/2023    Lab Results  Component Value Date   CHOL 226 (H) 10/20/2023   HDL 77.50 10/20/2023   LDLCALC 135 (H) 10/20/2023   TRIG 66.0 10/20/2023   CHOLHDL 3 10/20/2023    Medications Reviewed Today     Reviewed by Henrene Pastor, RPH-CPP (Pharmacist) on 11/07/23 at 1449  Med List Status: <None>   Medication Order Taking? Sig Documenting Provider Last Dose Status Informant  albuterol (VENTOLIN HFA)  108 (90 Base) MCG/ACT inhaler [id number] Yes Inhale 2 puffs into the lungs every 6 (six) hours as needed for wheezing or shortness of breath. Jacky Kindle, FNP Taking Active   amLODipine-benazepril (LOTREL) 5-20 MG capsule [id number] Yes Take 1 capsule by mouth once daily Drubel, Lillia Abed, PA-C Taking Active   atenolol (TENORMIN) 50 MG tablet [id number] Yes Take 1 tablet by mouth once daily Jacky Kindle, FNP Taking Active   Blood Glucose Monitoring Suppl (CONTOUR NEXT USB MONITOR) w/Device KIT [id number] No 1  kit by Does not apply route daily. To check blood sugar once daily.  Patient not taking: Reported on 11/07/2023   Reine Just Not Taking Active   CareTouch Safety Lancets 26G MISC [id number] No 1 Device by Does not apply route daily. To check blood sugar once daily.  Patient not taking: Reported on 11/07/2023   Margaretann Loveless, PA-C Not Taking Active   cetirizine (ZYRTEC) 10 MG tablet [id number] Yes Take 1 tablet (10 mg total) by mouth daily. [name], PA-C Taking Active   Continuous Glucose Sensor (FREESTYLE LIBRE 3 PLUS SENSOR) MISC [id number] Yes Change sensor every 15 days. [name], PA-C Taking Active   dapagliflozin propanediol (FARXIGA) 10 MG TABS tablet [id number] Yes Take 1 tablet (10 mg total) by mouth daily. For 2025 [place] Patient Assistance Program [place], [place], [place] Taking Active            Med Note [place], [place] B   Mon Oct 24, 2023  3:33 PM) [place] and [place] program thru 10/17/2024  ezetimibe (ZETIA) 10 MG tablet [id number] Yes Take 1 tablet (10 mg total) by mouth daily. [name], PA-C Taking Active   fluticasone (FLONASE) 50 MCG/ACT nasal spray [id number]  Place 2 sprays into both nostrils daily. Jacky Kindle, FNP  Active   glucose blood (CONTOUR NEXT TEST) test strip [id number] No To check blood sugar once daily.  Patient not taking: Reported on 11/07/2023   Margaretann Loveless, [place] Not Taking Active   insulin degludec ([name] FLEXTOUCH) 100 UNIT/ML FlexTouch Pen [id number] No Inject into the skin 2 (two) times daily. 24 units each morning and 22 units each evening  Patient not taking: Reported on 11/07/2023   [provider] Not Taking Active            Med Note Clydie Braun, Erving Sassano B   Fri Jul 08, 2023 11:15 AM) Gets from [employer] MAP  Insulin Pen Needle (NOVOFINE PLUS) 32G X 4 MM MISC [id number] No To use with ozempic injections  Patient not taking: Reported on 11/07/2023   Margaretann Loveless, PA-C Not Taking Active    montelukast (SINGULAIR) 10 MG tablet [id number] Yes Take 1 tablet (10 mg total) by mouth at bedtime. [name], PA-C Taking Active   omeprazole (PRILOSEC) 20 MG capsule [id number] Yes Take 20 mg by mouth daily. [provider] Taking Active Self           Med Note Clydie Braun, Cindie Laroche Sep 08, 2023 11:21 AM) Purchases OTC  rosuvastatin (CRESTOR) 40 MG tablet [id number] Yes Take 1 tablet (40 mg total) by mouth daily. [name], PA-C Taking Active   traZODone (DESYREL) 50 MG tablet [id number]  Take 0.5 tablets (25 mg total) by mouth at bedtime as needed for sleep. [name], PA-C  Active              Assessment/Plan:   Diabetes: blood glucose and GMI over the last 2 weeks  has been really great. I am so surprised that she has seen such a big difference just by changing her diet and since stopping [name].  - Reviewed goal A1c, goal fasting, and goal 2 hour post prandial glucose - Reviewed dietary modifications; Reviewed pros and cons of keto / carnivore diet.  - Continue to hold [name] U100  - 24 units each morning and 14 units each evening.  - Since patient is not able to take GLP agents due to past pancreatitis event, might need to consider prandial insulin in future.  [employer] - verified patient has been approved to receive [name] and pen needles 10/19/2023 thru 10/17/2024. Her first delivery is in progress with expected delivery date of around 11/08/2023.  - Called [place] and [place] to verify Rx for Farxiga from December 2024 was received and patient should receive her first fill / delivery around 11/09/2023.   Increase in HR: possibly due to recent diet changes or albuterol use.  Recommended she drink 4 to 8 glasses of water a day - sometimes dehydration can occur with keto type diets and could   Follow Up Plan: 1 to 2 weeks by phone to review Continuous Glucose Monitor report.   Henrene Pastor, PharmD Clinical Pharmacist [facility]

## 2023-11-11 ENCOUNTER — Telehealth: Payer: Self-pay

## 2023-11-11 NOTE — Telephone Encounter (Signed)
  SHIPMENT TO PT 11/10/2023

## 2023-11-15 ENCOUNTER — Telehealth: Payer: Self-pay

## 2023-11-15 NOTE — Telephone Encounter (Signed)
Copied from CRM [id number]. Topic: General - Other >> Nov 15, 2023 11:05 AM Florestine Avers wrote: Reason for CRM: Patient called in requesting to speak to Tammy (the clinical pharmacist). Patient is requesting a call back.

## 2023-11-16 NOTE — Telephone Encounter (Addendum)
Patient notified that [name] has been approved - expected delivery to our office 2/11 to 2/18. She has not needed to take [name] since she started a new diet but she does have a box on hand incase she needs to restart [name].  If patient continues to not need [name], plan to [employer] and cancel any future shipments.

## 2023-11-16 NOTE — Telephone Encounter (Signed)
Patient states she received a letter form [employer] that they are in need of additional information to process her application. However I spoke with representative last week that confirmed her enrollment.  [employer]. Was not able to verify needed info thru automatic system so held for 30 minutes to talk to a representative. Today representative stated that she needed to verify patient's insurance with [facility].  They verfied insurance and I was again told patient was approved thru 10/17/2024. They are starting to process order for [name] and [employer] needles today. Should receive in 10 to 14 business days.

## 2023-11-25 ENCOUNTER — Encounter: Payer: Self-pay | Admitting: Pharmacist

## 2023-11-25 ENCOUNTER — Other Ambulatory Visit: Payer: Self-pay | Admitting: Pharmacist

## 2023-11-25 NOTE — Progress Notes (Signed)
 11/25/2023 Name: Shari Lee MRN: [id number] DOB: [birth year]  Chief Complaint  Patient presents with   Diabetes    Kenlynn Houde is a 68 y.o. year old female who presented for a telephone visit.   They were referred to the pharmacist by their PCP for assistance in managing diabetes and medication access.    Subjective:   Medication Access/Adherence  Current Pharmacy:  [facility] [address] ([address]), [place] - [address] [place]) [place] [address] Phone: [phone] Fax: [phone]  [facility] - [place], [place] - [address] [place] [address] Phone: [phone] Fax: [phone]   Patient reports affordability concerns with their medications: Yes  Patient reports access/transportation concerns to their pharmacy: No  Patient reports adherence concerns with their medications:  Yes      Diabetes:  Current medications:  Farxiga  10mg  daily - [place] - approved thru 10/17/2024   She was previously taking [name]  24 units each morning and 22 units each evening  - but she has held for the last 2 weeks because she started keto diet and blood glucose was getting too low. She has been approved to get [name]  and pen needles from [employer] medication assistance program along with pen needles for 2025.  Has not received first delivery yet   Medications tried in the past: Ozempic  - stopped due to pancreatitis; metformin  - worsened IBS related diarrhea    Date of Download: 11/25/2023 % Time CGM is active: 96%  Average Glucose: 114 mg/dL (previously 888 mg/dL) Glucose Management Indicator: 6.0% (preciously was 6.0%) Glucose Variability: 20.2% (goal <36%) (previously was 15.6%) Time in Goal:  - Time in range 70-180: 97% (previously 100%) - Time above range: 2% ( previously 0%) - Time below range: 1% ( previously 0%)    Wt Readings from Last 3 Encounters:   11/25/23 195 lb (88.5 kg)  11/04/23 200 lb 6 oz (90.9 kg)  10/20/23 205 lb 4 oz (93.1 kg)   Hyperlipidemia/ASCVD Risk Reduction  Current lipid lowering medications: ezetimibe  10mg  daily Medications tried in the past: rosuvastatin  - patient stop because she felt It was affecting her memory.   Objective:  Lab Results  Component Value Date   HGBA1C 9.7 (H) 10/20/2023    Lab Results  Component Value Date   CREATININE 0.93 11/04/2023   BUN 29 (H) 11/04/2023   NA 138 11/04/2023   K 5.1 11/04/2023   CL 105 11/04/2023   CO2 20 11/04/2023    Lab Results  Component Value Date   CHOL 226 (H) 10/20/2023   HDL 77.50 10/20/2023   LDLCALC 135 (H) 10/20/2023   TRIG 66.0 10/20/2023   CHOLHDL 3 10/20/2023    Medications Reviewed Today     Reviewed by Carla Milling, RPH-CPP (Pharmacist) on 11/25/23 at 1635  Med List Status: <None>   Medication Order Taking? Sig Documenting Provider Last Dose Status Informant  albuterol  (VENTOLIN  HFA) 108 (90 Base) MCG/ACT inhaler [id number] Yes Inhale 2 puffs into the lungs every 6 (six) hours as needed for wheezing or shortness of breath. Emilio Kelly DASEN, FNP Taking Active   amLODipine -benazepril  (LOTREL) 5-20 MG capsule [id number] Yes Take 1 capsule by mouth once daily Cyndi Shaver, PA-C Taking Active   atenolol  (TENORMIN ) 50 MG tablet [id number] Yes Take 1 tablet by mouth once daily Emilio Kelly DASEN, FNP Taking Active   Blood Glucose Monitoring Suppl (CONTOUR NEXT  USB MONITOR) w/Device KIT [id number] No 1 kit by Does not apply route daily. To check blood sugar once daily.  Patient not taking: Reported on 11/25/2023   Vivienne Delon CHRISTELLA DEVONNA Not Taking Active   CareTouch Safety Lancets 26G MISC [id number] No 1 Device by Does not apply route daily. To check blood sugar once daily.  Patient not taking: Reported on 11/25/2023   Vivienne Delon CHRISTELLA, PA-C Not Taking Active   cetirizine  (ZYRTEC ) 10 MG tablet [id number]  Take 1 tablet (10 mg total) by mouth  daily. Cyndi Shaver, PA-C  Active   Continuous Glucose Sensor (FREESTYLE LIBRE 3 PLUS SENSOR) MISC [id number] Yes Change sensor every 15 days. Cyndi Shaver, PA-C Taking Active   dapagliflozin  propanediol (FARXIGA ) 10 MG TABS tablet [id number] Yes Take 1 tablet (10 mg total) by mouth daily. For 2025 [place] Cyndi Shaver, [place] Taking Active            Med Note [place], [place]   Mon Oct 24, 2023  3:33 PM) [place] thru 10/17/2024  ezetimibe  (ZETIA ) 10 MG tablet [id number] Yes Take 1 tablet (10 mg total) by mouth daily. Cyndi Shaver, PA-C Taking Active   fluticasone  (FLONASE ) 50 MCG/ACT nasal spray [id number]  Place 2 sprays into both nostrils daily. Emilio Kelly DASEN, FNP  Active   glucose blood (CONTOUR NEXT TEST) test strip [id number] No To check blood sugar once daily.  Patient not taking: Reported on 11/25/2023   Vivienne Delon CHRISTELLA, PA-C Not Taking Active   insulin  degludec ([name]  FLEXTOUCH) 100 UNIT/ML FlexTouch Pen [id number] No Inject into the skin 2 (two) times daily. 24 units each morning and 22 units each evening  Patient not taking: Reported on 11/25/2023   [provider] Not Taking Active            Med Note JUSTINO, Quinten [place] B   Fri Jul 08, 2023 11:15 AM) Gets from [employer] MAP  Insulin  Pen Needle (NOVOFINE PLUS) 32G X 4 MM MISC [id number] No To use with ozempic  injections  Patient not taking: Reported on 11/25/2023   Vivienne Delon CHRISTELLA, PA-C Not Taking Active   montelukast  (SINGULAIR ) 10 MG tablet [id number]  Take 1 tablet (10 mg total) by mouth at bedtime. Cyndi Shaver, PA-C  Active   omeprazole  (PRILOSEC ) 20 MG capsule [id number]  Take 20 mg by mouth daily. [provider]  Active Self           Med Note JUSTINO, MADELIN KATHEE Schaumann Sep 08, 2023 11:21 AM) Purchases OTC  rosuvastatin  (CRESTOR ) 40 MG tablet [id number] No Take 1 tablet (40 mg total) by mouth daily.  Patient not taking: Reported on 11/25/2023    Cyndi Shaver, PA-C Not Taking Active               Assessment/Plan:  Diabetes: blood glucose and GMI over the last 2 weeks has been really great. I am so surprised that she has seen such a big difference just by changing her diet and since stopping [name] .  - Reviewed goal A1c, goal fasting, and goal 2 hour post prandial glucose - Reviewed dietary modifications - Continue to hold [name]  U100    - Approved to receive [name]  and pen needles 10/19/2023 thru 10/17/2024. If patient continues to not need [name]  though will plan to cancel in future.  - Continue Farxiga  10mg  daily.     Hyperlipidemia/ASCVD Risk Reduction:Currently uncontrolled.  - Reviewed long term complications of uncontrolled cholesterol -  continue ezetimibe  10mg  daily    Follow Up Plan: 1 month  [name], PharmD Clinical Pharmacist [facility]  Population Health [phone]

## 2023-12-14 IMAGING — MG MM DIGITAL SCREENING BILAT W/ TOMO AND CAD
6 of 10 series · 6 of 30 positions shown · non-contrast
Comparison: Previous exam(s).

CLINICAL DATA: Screening.

EXAM:
DIGITAL SCREENING BILATERAL MAMMOGRAM WITH TOMOSYNTHESIS AND CAD
TECHNIQUE: Bilateral screening digital craniocaudal and mediolateral oblique
mammograms were obtained. Bilateral screening digital breast
tomosynthesis was performed. The images were evaluated with
computer-aided detection.

[R MLO synth-2D]
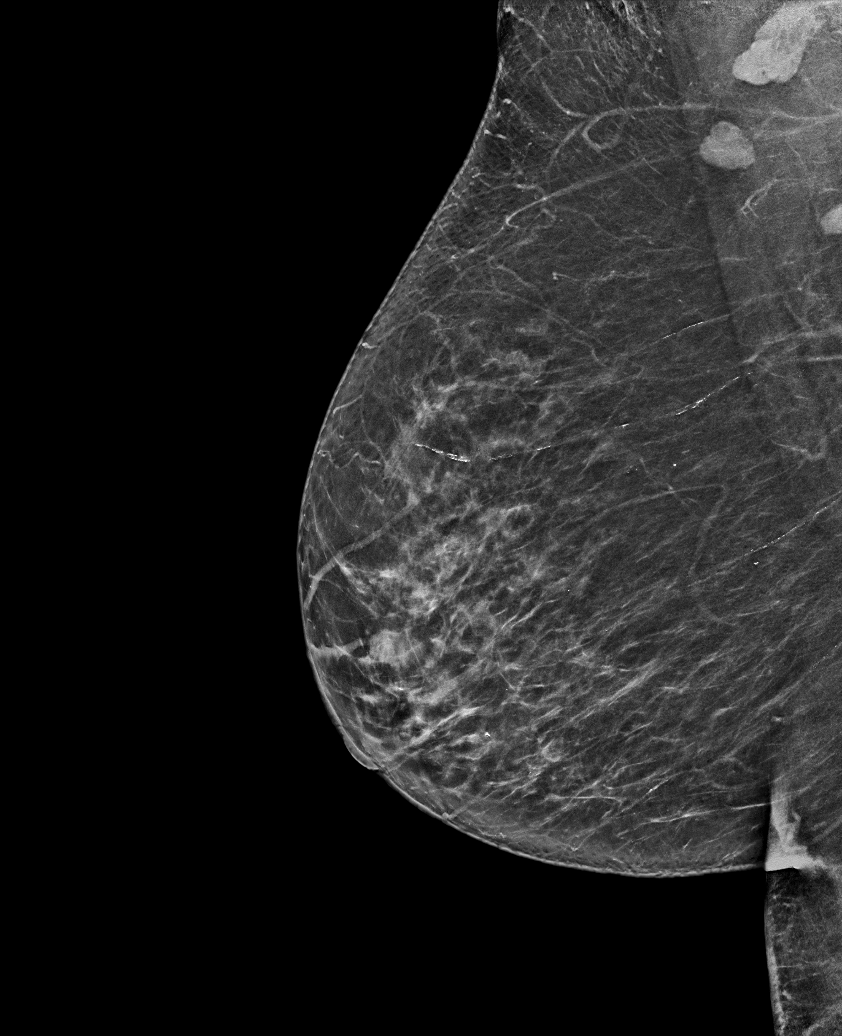

[R CC synth-2D (1 of 2)]
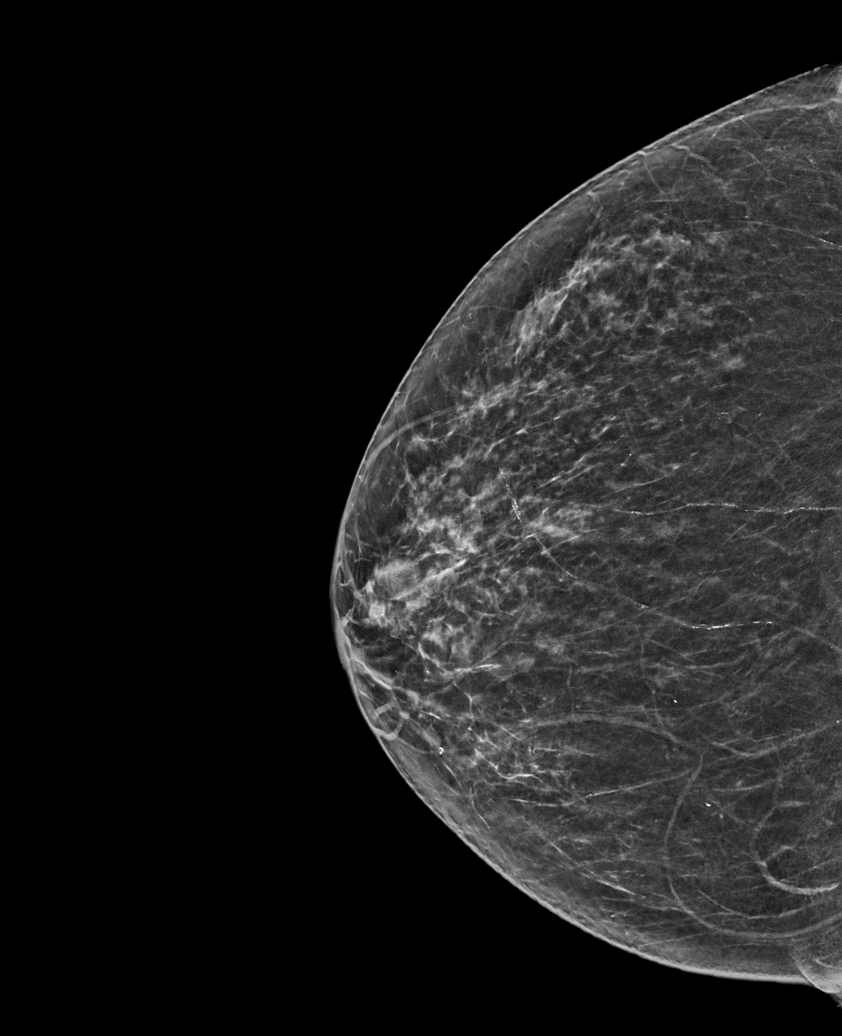

[L MLO synth-2D]
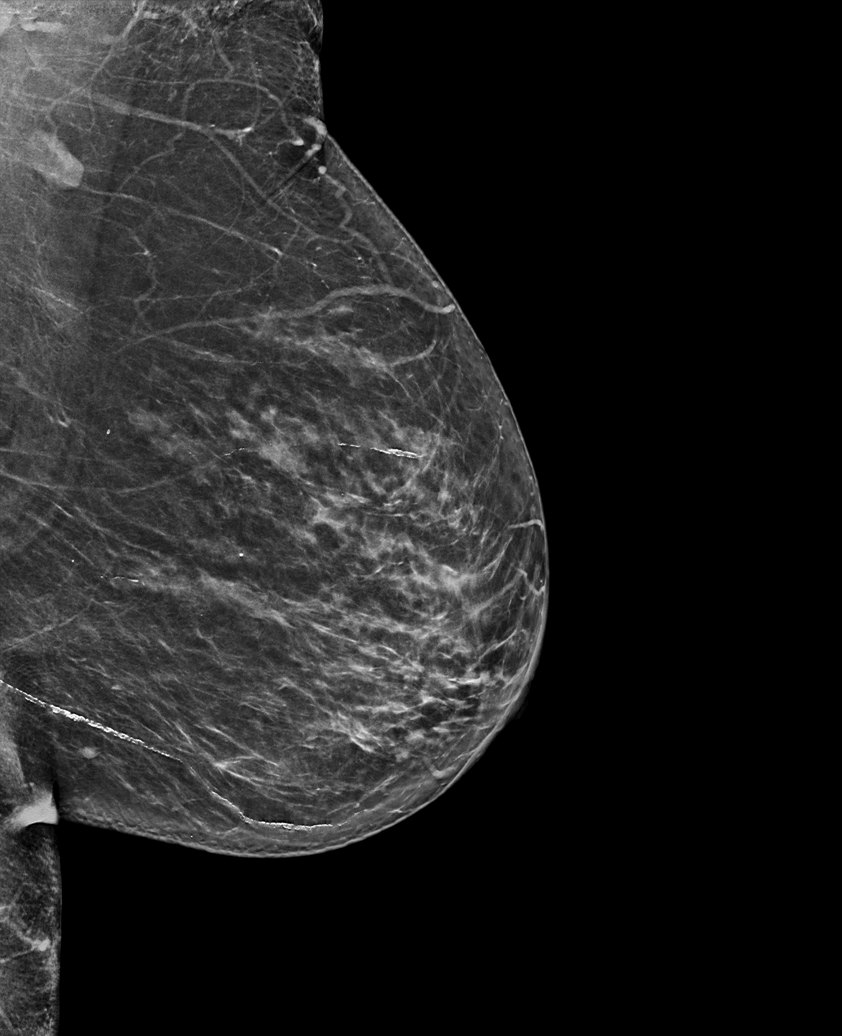

[R CC synth-2D (2 of 2)]
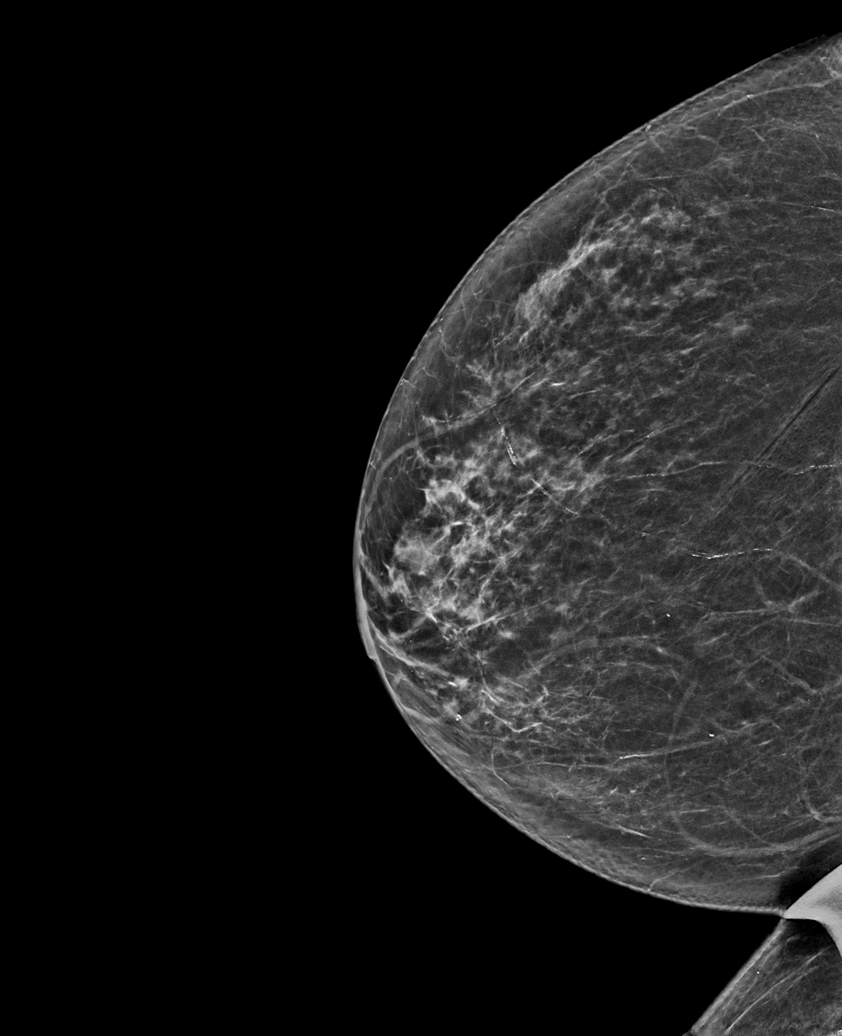

[L CC synth-2D]
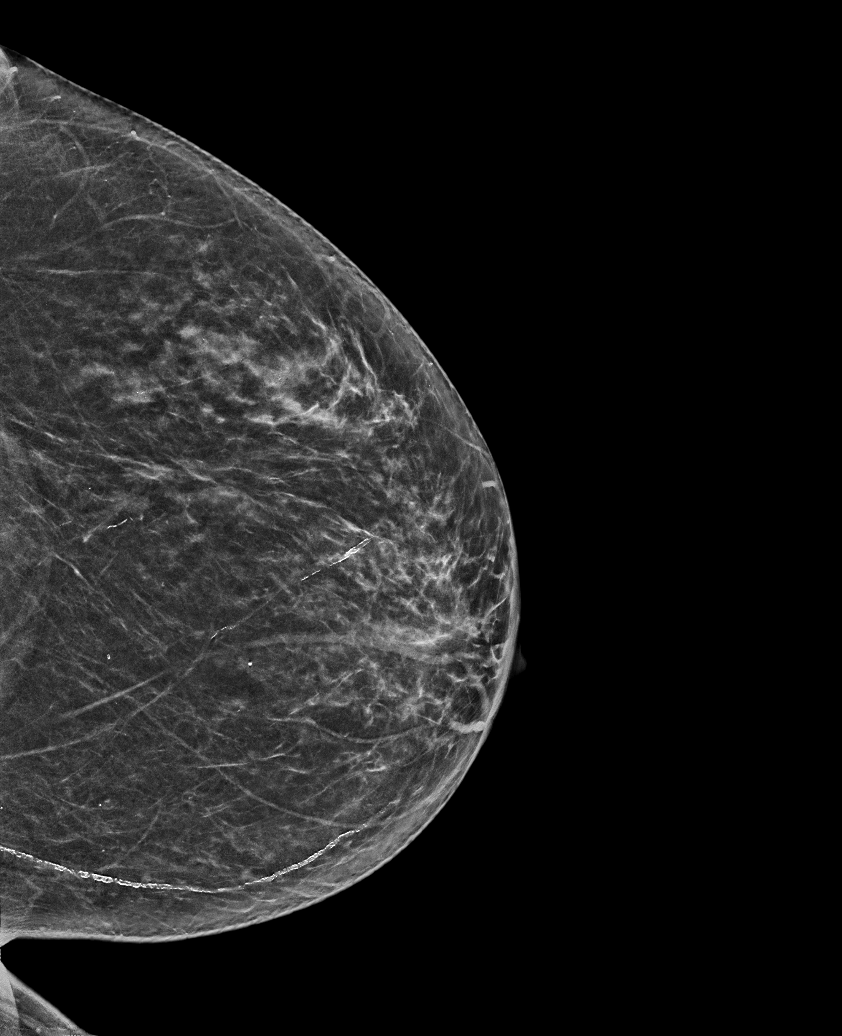

[R CC tomo · tomo slice 34/67.0]
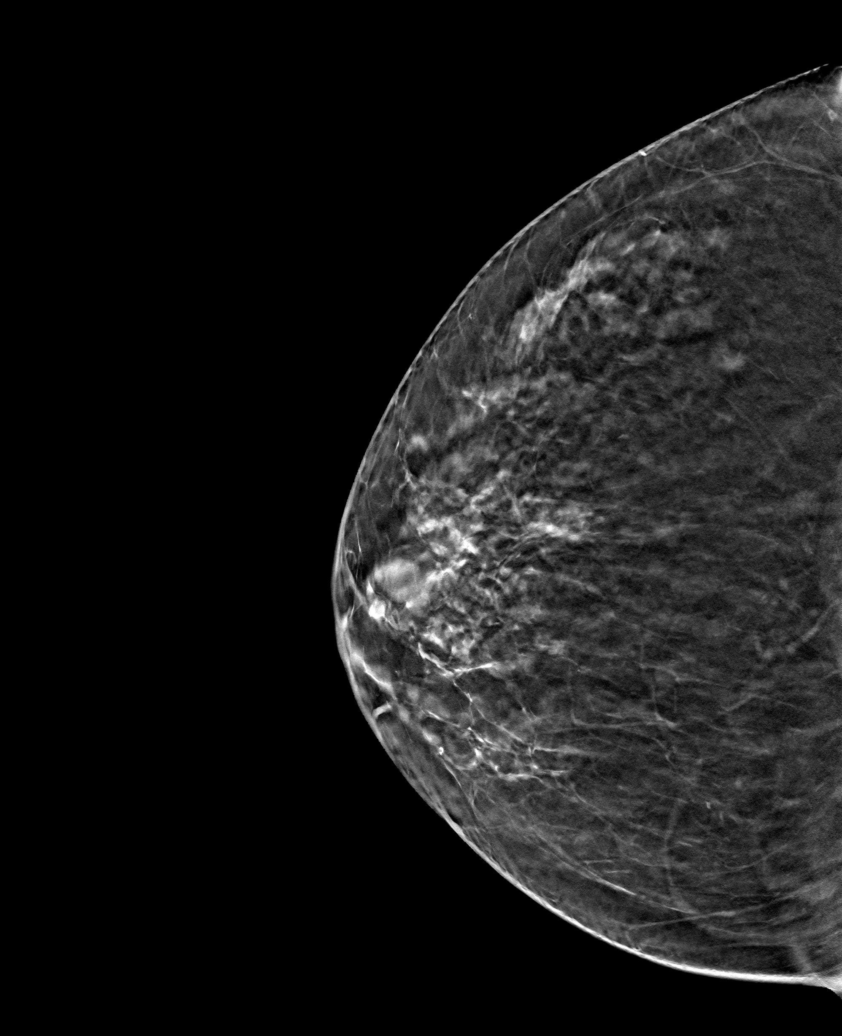

[6 of 30 positions shown; findings below may reference images not displayed]

ACR Breast Density Category b: There are scattered areas of
fibroglandular density.
FINDINGS: There are no findings suspicious for malignancy.
IMPRESSION: No mammographic evidence of malignancy. A result letter of this
screening mammogram will be mailed directly to the patient.

RECOMMENDATION:
Screening mammogram in one year. (Code:51-O-LD2)

BI-RADS CATEGORY  1: Negative.

## 2023-12-15 ENCOUNTER — Other Ambulatory Visit: Payer: Self-pay

## 2023-12-15 ENCOUNTER — Other Ambulatory Visit: Payer: Self-pay | Admitting: Family Medicine

## 2023-12-15 ENCOUNTER — Encounter: Payer: Self-pay | Admitting: Physician Assistant

## 2023-12-15 DIAGNOSIS — I10 Essential (primary) hypertension: Secondary | ICD-10-CM

## 2023-12-15 MED ORDER — ATENOLOL 50 MG PO TABS
50.0000 mg | ORAL_TABLET | Freq: Every day | ORAL | 0 refills | Status: DC
Start: 2023-12-15 — End: 2024-03-13

## 2023-12-16 ENCOUNTER — Ambulatory Visit
Admission: RE | Admit: 2023-12-16 | Discharge: 2023-12-16 | Disposition: A | Discharge status: Home / Self Care | Payer: Medicare Other | Source: Ambulatory Visit | Attending: Physician Assistant | Admitting: Physician Assistant

## 2023-12-16 DIAGNOSIS — Z1231 Encounter for screening mammogram for malignant neoplasm of breast: Secondary | ICD-10-CM | POA: Insufficient documentation

## 2023-12-20 ENCOUNTER — Encounter: Payer: Self-pay | Admitting: Physician Assistant

## 2023-12-22 ENCOUNTER — Other Ambulatory Visit: Payer: Self-pay | Admitting: Pharmacist

## 2023-12-27 ENCOUNTER — Telehealth: Payer: Self-pay

## 2023-12-27 NOTE — Telephone Encounter (Signed)
 Patient notified medication received from [employer] [facility] and ready for pick up. Received 4 boxes of tresiba and 2 boxes of needles. Windell Moulding CMA

## 2023-12-29 ENCOUNTER — Other Ambulatory Visit: Payer: Self-pay | Admitting: Pharmacist

## 2023-12-30 ENCOUNTER — Telehealth: Payer: Self-pay | Admitting: Pharmacist

## 2023-12-30 ENCOUNTER — Other Ambulatory Visit: Payer: Self-pay | Admitting: Pharmacist

## 2023-12-30 NOTE — Progress Notes (Signed)
 Attempt was made to contact patient by phone today for follow up by Clinical Pharmacist regarding diabetes / Continuous Glucose Monitor .  Unable to reach patient. LM on VM with my contact number [phone].

## 2024-01-24 ENCOUNTER — Encounter: Payer: Self-pay | Admitting: Physician Assistant

## 2024-01-24 ENCOUNTER — Ambulatory Visit (INDEPENDENT_AMBULATORY_CARE_PROVIDER_SITE_OTHER): Payer: Medicare Other | Admitting: Physician Assistant

## 2024-01-24 VITALS — BP 140/79 | HR 60 | Ht 61.0 in | Wt 195.2 lb

## 2024-01-24 DIAGNOSIS — E1159 Type 2 diabetes mellitus with other circulatory complications: Secondary | ICD-10-CM | POA: Diagnosis not present

## 2024-01-24 DIAGNOSIS — I350 Nonrheumatic aortic (valve) stenosis: Secondary | ICD-10-CM

## 2024-01-24 DIAGNOSIS — Z794 Long term (current) use of insulin: Secondary | ICD-10-CM

## 2024-01-24 DIAGNOSIS — E1165 Type 2 diabetes mellitus with hyperglycemia: Secondary | ICD-10-CM | POA: Diagnosis not present

## 2024-01-24 DIAGNOSIS — I152 Hypertension secondary to endocrine disorders: Secondary | ICD-10-CM

## 2024-01-24 LAB — BASIC METABOLIC PANEL WITH GFR
BUN: 25 mg/dL — ABNORMAL HIGH (ref 6–23)
CO2: 24 meq/L (ref 19–32)
Calcium: 9.4 mg/dL (ref 8.4–10.5)
Chloride: 103 meq/L (ref 96–112)
Creatinine, Ser: 0.92 mg/dL (ref 0.40–1.20)
GFR: 64.63 mL/min (ref >60.00)
Glucose, Bld: 223 mg/dL — ABNORMAL HIGH (ref 70–99)
Potassium: 4.8 meq/L (ref 3.5–5.1)
Sodium: 137 meq/L (ref 135–145)

## 2024-01-24 LAB — LIPID PANEL
Cholesterol: 189 mg/dL (ref 0–200)
HDL: 55.4 mg/dL (ref >39.00)
LDL Cholesterol: 110 mg/dL — ABNORMAL HIGH (ref 0–99)
NonHDL: 133.88
Total CHOL/HDL Ratio: 3
Triglycerides: 119 mg/dL (ref 0.0–149.0)
VLDL: 23.8 mg/dL (ref 0.0–40.0)

## 2024-01-24 LAB — MICROALBUMIN / CREATININE URINE RATIO
Creatinine,U: 60.6 mg/dL
Microalb Creat Ratio: 15.3 mg/g (ref 0.0–30.0)
Microalb, Ur: 0.9 mg/dL (ref 0.0–1.9)

## 2024-01-24 LAB — HEMOGLOBIN A1C: Hgb A1c MFr Bld: 9 % — ABNORMAL HIGH (ref 4.6–6.5)

## 2024-01-24 NOTE — Assessment & Plan Note (Signed)
 Repeat A1c today Advised go back to splitting insulin -- 20 U in AM and 10 in PM, increasing PM dose by 2 U every 2-3 days until fasting < 150 .  F/u with pharmacy.  Pt stopped her statin , repeat lipids.  On ace-I  Ordering uacr today, foot exam utd

## 2024-01-24 NOTE — Progress Notes (Signed)
 Established patient visit   Patient: Shari Lee   DOB: 1956-06-15   68 y.o. Female  MRN: [id number] Visit Date: 01/24/2024  Today's healthcare provider: [name], PA-C   Cc. Dm f/u  Subjective    Diabetes Mellitus Type II, Follow-up  Lab Results  Component Value Date   HGBA1C 9.7 (H) 10/20/2023   HGBA1C 10.5 (H) 09/06/2023   HGBA1C 7.8 (H) 03/21/2023   Wt Readings from Last 3 Encounters:  01/24/24 195 lb 3.2 oz (88.5 kg)  11/25/23 195 lb (88.5 kg)  11/04/23 200 lb 6 oz (90.9 kg)   Pt reports initially doing well with her carnivore diet but recently has fallen back into old habits. Reports high average sugars and that she restarted her insulin.  Home blood sugar records: > 200 average  Episodes of hypoglycemia? no   Current insulin regiment: 30 U in AM Pertinent Labs: Lab Results  Component Value Date   CHOL 226 (H) 10/20/2023   HDL 77.50 10/20/2023   LDLCALC 135 (H) 10/20/2023   TRIG 66.0 10/20/2023   CHOLHDL 3 10/20/2023   Lab Results  Component Value Date   NA 138 11/04/2023   K 5.1 11/04/2023   CREATININE 0.93 11/04/2023   GFRNONAA >60 10/13/2020   MICRALBCREAT 17 12/17/2022     ---------------------------------------------------------------------------------------------------  Medications: Outpatient Medications Prior to Visit  Medication Sig   albuterol (VENTOLIN HFA) 108 (90 Base) MCG/ACT inhaler Inhale 2 puffs into the lungs every 6 (six) hours as needed for wheezing or shortness of breath.   amLODipine-benazepril (LOTREL) 5-20 MG capsule Take 1 capsule by mouth once daily   atenolol (TENORMIN) 50 MG tablet Take 1 tablet (50 mg total) by mouth daily.   Blood Glucose Monitoring Suppl (CONTOUR NEXT USB MONITOR) w/Device KIT 1 kit by Does not apply route daily. To check blood sugar once daily. (Patient not taking: Reported on 11/25/2023)   CareTouch Safety Lancets 26G MISC 1 Device by Does not apply route daily. To check blood sugar once  daily. (Patient not taking: Reported on 11/25/2023)   cetirizine (ZYRTEC) 10 MG tablet Take 1 tablet (10 mg total) by mouth daily.   Continuous Glucose Sensor (FREESTYLE LIBRE 3 PLUS SENSOR) MISC Change sensor every 15 days.   dapagliflozin propanediol (FARXIGA) 10 MG TABS tablet Take 1 tablet (10 mg total) by mouth daily. For 2025 [place] Patient Assistance Program   ezetimibe (ZETIA) 10 MG tablet Take 1 tablet (10 mg total) by mouth daily.   fluticasone (FLONASE) 50 MCG/ACT nasal spray Place 2 sprays into both nostrils daily.   glucose blood (CONTOUR NEXT TEST) test strip To check blood sugar once daily. (Patient not taking: Reported on 11/25/2023)   insulin degludec ([name] FLEXTOUCH) 100 UNIT/ML FlexTouch Pen Inject into the skin 2 (two) times daily. 24 units each morning and 22 units each evening (Patient not taking: Reported on 11/25/2023)   Insulin Pen Needle (NOVOFINE PLUS) 32G X 4 MM MISC To use with ozempic injections (Patient not taking: Reported on 11/25/2023)   montelukast (SINGULAIR) 10 MG tablet Take 1 tablet (10 mg total) by mouth at bedtime.   omeprazole (PRILOSEC) 20 MG capsule Take 20 mg by mouth daily.   rosuvastatin (CRESTOR) 40 MG tablet Take 1 tablet (40 mg total) by mouth daily. (Patient not taking: Reported on 11/25/2023)   No facility-administered medications prior to visit.    Review of Systems  Constitutional:  Negative for fatigue and fever.  Respiratory:  Negative for cough  and shortness of breath.   Cardiovascular:  Negative for chest pain and leg swelling.  Gastrointestinal:  Negative for abdominal pain.  Neurological:  Negative for dizziness and headaches.       Objective    BP (!) 140/79   Pulse 60   Ht 5\' 1"  (1.549 m)   Wt 195 lb 3.2 oz (88.5 kg)   BMI 36.88 kg/m    Physical Exam Vitals reviewed.  Constitutional:      Appearance: She is not ill-appearing.  HENT:     Head: Normocephalic.  Eyes:     Conjunctiva/sclera: Conjunctivae normal.   Cardiovascular:     Rate and Rhythm: Normal rate.  Pulmonary:     Effort: Pulmonary effort is normal. No respiratory distress.  Neurological:     Mental Status: She is alert and oriented to person, place, and time.  Psychiatric:        Mood and Affect: Mood normal.        Behavior: Behavior normal.     No results found for any visits on 01/24/24.  Assessment & Plan    Type 2 diabetes mellitus with hyperglycemia, with long-term current use of insulin (HCC) Assessment & Plan: Repeat A1c today Advised go back to splitting insulin -- 20 U in AM and 10 in PM, increasing PM dose by 2 U every 2-3 days until fasting < 150 .  F/u with pharmacy.  Pt stopped her statin , repeat lipids.  On ace-I  Ordering uacr today, foot exam utd  Orders: -     Hemoglobin A1c -     Basic metabolic panel with GFR -     Lipid panel -     Microalbumin / creatinine urine ratio  Hypertension associated with diabetes (HCC) Assessment & Plan: Mild elevation today  Cont medications as prescribed, will monitor   Aortic valve stenosis, etiology of cardiac valve disease unspecified -     Ambulatory referral to Cardiology    Return in about 3 months (around 04/24/2024) for DMII, hypertension.      [name], PA-C  [facility] at [facility] [phone] (phone) [phone] (fax)  [facility]

## 2024-01-24 NOTE — Assessment & Plan Note (Signed)
 Mild elevation today  Cont medications as prescribed, will monitor

## 2024-01-25 ENCOUNTER — Encounter: Payer: Self-pay | Admitting: Physician Assistant

## 2024-01-26 ENCOUNTER — Other Ambulatory Visit: Payer: Self-pay | Admitting: Physician Assistant

## 2024-01-27 ENCOUNTER — Telehealth: Payer: Self-pay | Admitting: Physician Assistant

## 2024-01-27 NOTE — Telephone Encounter (Signed)
 Copied from CRM [id number]. Topic: Medicare AWV >> Jan 27, 2024 11:41 AM Payton Doughty wrote: Reason for CRM: Called LVM 01/27/2024 to schedule AWV. Please schedule Virtual or Telehealth visits ONLY.   Verlee Rossetti; Care Guide Ambulatory Clinical Support [facility] Direct Dial: [phone]

## 2024-02-01 ENCOUNTER — Encounter: Payer: Self-pay | Admitting: Cardiology

## 2024-02-01 ENCOUNTER — Ambulatory Visit: Attending: Cardiology | Admitting: Cardiology

## 2024-02-01 VITALS — BP 130/78 | HR 61 | Ht 62.0 in | Wt 196.0 lb

## 2024-02-01 DIAGNOSIS — E1165 Type 2 diabetes mellitus with hyperglycemia: Secondary | ICD-10-CM

## 2024-02-01 DIAGNOSIS — Z794 Long term (current) use of insulin: Secondary | ICD-10-CM

## 2024-02-01 DIAGNOSIS — I10 Essential (primary) hypertension: Secondary | ICD-10-CM

## 2024-02-01 DIAGNOSIS — I350 Nonrheumatic aortic (valve) stenosis: Secondary | ICD-10-CM | POA: Diagnosis not present

## 2024-02-01 NOTE — Progress Notes (Addendum)
 Cardiology Office Note:  .   Date:  02/01/2024  ID:  Elwyn Lade, DOB [birth year], MRN [id number] PCP: [name], PA-C  [facility] Providers Cardiologist:  Debbe Odea, MD    History of Present Illness: .   Shari Lee is a 68 y.o. female with past medical history of hypertension, type 2 diabetes, aortic stenosis, heart murmur, who presents today for follow-up.  She was last seen in clinic 02/11/2022 by Dr.Agbor-Etang.  She was.  Echocardiogram she had previously been evaluated by her PCP and was noted to have a cardiac murmur.  Echocardiogram was obtained to evaluate any structural abnormalities.  Patient clinically was asymptomatic denying chest pain shortness of breath or palpitations.  Echocardiogram revealing normal EF 60 to 65%, moderate aortic valve stenosis with mean gradient 25 mmHg recommendation was for repeat echo yearly.  She returns to clinic today stating that overall from a cardiac perspective she has been doing well.  She denies any shortness of breath, chest pain, peripheral edema, lightheadedness, or dizziness.  States that she is no longer taking Ozempic as she went with a 30 pound weight loss but also ended up with pancreatitis.  She recently was started on rosuvastatin and was having some side effects and stopped taking the rosuvastatin her PCP reluctantly placed her on ezetimibe 10 mg daily.  She has been taking this medication without any adverse side effects.  She denies any hospitalizations or visits to the emergency department.  ROS: 10 point review of systems has been reviewed and considered negative except ones been listed in the HPI  Studies Reviewed: Marland Kitchen   EKG Interpretation Date/Time:  Wednesday February 01 2024 15:08:00 EDT Ventricular Rate:  61 PR Interval:  204 QRS Duration:  90 QT Interval:  440 QTC Calculation: 442 R Axis:   43  Text Interpretation: Normal sinus rhythm Anterior infarct , old When compared with ECG of 06-Oct-2020  21:22, PREVIOUS ECG IS PRESENT Confirmed by Charlsie Quest ([id number]) on 02/01/2024 3:13:53 PM    2D echo 01/27/2022 1. Left ventricular ejection fraction, by estimation, is 60 to 65%. The  left ventricle has normal function. The left ventricle has no regional  wall motion abnormalities. There is moderate left ventricular hypertrophy.  Left ventricular diastolic  parameters are consistent with Grade I diastolic dysfunction (impaired  relaxation). The average left ventricular global longitudinal strain is  -17.5 %. The global longitudinal strain is normal.   2. Right ventricular systolic function is normal. The right ventricular  size is normal.   3. Left atrial size was mildly dilated.   4. The mitral valve is normal in structure. No evidence of mitral valve  regurgitation. No evidence of mitral stenosis.   5. The aortic valve is normal in structure. There is moderate  calcification of the aortic valve. Aortic valve regurgitation is not  visualized. Moderate aortic valve stenosis. Aortic valve area, by VTI  measures 0.82 cm. Aortic valve mean gradient  measures 25.3 mmHg. Aortic valve Vmax measures 3.36 m/s.   6. The inferior vena cava is normal in size with greater than 50%  respiratory variability, suggesting right atrial pressure of 3 mmHg.   Risk Assessment/Calculations:             Physical Exam:   VS:  BP 130/78 (BP Location: Left Arm)   Pulse 61   Ht 5\' 2"  (1.575 m)   Wt 196 lb (88.9 kg)   SpO2 96%   BMI 35.85 kg/m    Wt Readings  from Last 3 Encounters:  02/01/24 196 lb (88.9 kg)  01/24/24 195 lb 3.2 oz (88.5 kg)  11/25/23 195 lb (88.5 kg)    GEN: Well nourished, well developed in no acute distress NECK: No JVD; No carotid bruits CARDIAC: RRR, II/VI systolic murmur RUSB, without rubs or gallops RESPIRATORY:  Clear to auscultation without rales, wheezing or rhonchi  ABDOMEN: Soft, non-tender, non-distended EXTREMITIES:  No edema; No deformity   ASSESSMENT AND PLAN: .    Aortic valve stenosis with systolic murmur heard on exam.  Prior echocardiogram revealed an LVEF of 60 to 65%, moderate aortic valve stenosis with mean gradient of 25 mm regular recommendation was to repeat echocardiogram in 1 year.  Unfortunately she did not follow-up last year so echocardiogram has had been scheduled for this coming year to reevaluate her aortic stenosis.  Patient is asymptomatic without shortness of breath, lightheadedness dizziness or palpitations.  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  Will continue with surveillance studies pending upcoming results.   Primary hypertension with blood pressure 130/70.  Blood pressure has been well-controlled.  She is continued on amlodipine/benazepril 5/20 mg daily.  She has been encouraged to continue to monitor pressures 1 to 2 hours postmedication administration.  Type 2 diabetes.  A1c was last 9.0.  She is continued on insulin.  She, Farxiga 10 mg daily.  Ongoing management per PCP.       Dispo: Patient to return to clinic to see MD/APP in 11 to 12 months or sooner if needed  Signed, Zakyra Kukuk, NP

## 2024-02-01 NOTE — Patient Instructions (Addendum)
 Medication Instructions:  Your physician recommends that you continue on your current medications as directed. Please refer to the Current Medication list given to you today.  *If you need a refill on your cardiac medications before your next appointment, please call your pharmacy*  Lab Work: No labs ordered today  If you have labs (blood work) drawn today and your tests are completely normal, you will receive your results only by: MyChart Message (if you have MyChart) OR A paper copy in the mail If you have any lab test that is abnormal or we need to change your treatment, we will call you to review the results.  Testing/Procedures: Your physician has requested that you have an echocardiogram. Echocardiography is a painless test that uses sound waves to create images of your heart. It provides your doctor with information about the size and shape of your heart and how well your heart's chambers and valves are working.   You may receive an ultrasound enhancing agent through an IV if needed to better visualize your heart during the echo. This procedure takes approximately one hour.  There are no restrictions for this procedure.  This will take place at [address] [facility] [address] [facility]) [address], [place] [address]  Please note: We ask at that you not bring children with you during ultrasound (echo/ vascular) testing. Due to room size and safety concerns, children are not allowed in the ultrasound rooms during exams. Our front office staff cannot provide observation of children in our lobby area while testing is being conducted. An adult accompanying a patient to their appointment will only be allowed in the ultrasound room at the discretion of the ultrasound technician under special circumstances. We apologize for any inconvenience.   Follow-Up: At [facility], you and your health needs are our priority.  As part of our continuing mission to provide you with exceptional heart  care, our providers are all part of one team.  This team includes your primary Cardiologist (physician) and Advanced Practice Providers or APPs (Physician Assistants and Nurse Practitioners) who all work together to provide you with the care you need, when you need it.  Your next appointment:   1 year(s)  Provider:   You may see Constancia Delton, MD or one of the following Advanced Practice Providers on your designated Care Team:   Laneta Pintos, NP Gildardo Labrador, PA-C Varney Gentleman, PA-C Cadence Cameron, PA-C Ronald Cockayne, NP Morey Ar, NP    We recommend signing up for the patient portal called "MyChart".  Sign up information is provided on this After Visit Summary.  MyChart is used to connect with patients for Virtual Visits (Telemedicine).  Patients are able to view lab/test results, encounter notes, upcoming appointments, etc.  Non-urgent messages can be sent to your provider as well.   To learn more about what you can do with MyChart, go to [email or web address].   Other Instructions Bempedoic acid, sold under the brand name Nexletol, is a medication used to lower high cholesterol levels, particularly low-density lipoprotein (LDL) cholesterol  Information about Nexlizet,Repatha, Praluent given to patient

## 2024-03-01 ENCOUNTER — Other Ambulatory Visit: Payer: Self-pay | Admitting: Physician Assistant

## 2024-03-01 DIAGNOSIS — R0981 Nasal congestion: Secondary | ICD-10-CM

## 2024-03-05 ENCOUNTER — Ambulatory Visit: Attending: Cardiology

## 2024-03-05 ENCOUNTER — Ambulatory Visit: Payer: Self-pay | Admitting: Cardiology

## 2024-03-05 DIAGNOSIS — I350 Nonrheumatic aortic (valve) stenosis: Secondary | ICD-10-CM

## 2024-03-05 LAB — ECHOCARDIOGRAM COMPLETE
AR max vel: 0.54 cm2
AV Area VTI: 0.58 cm2
AV Area mean vel: 0.61 cm2
AV Mean grad: 48 mmHg
AV Peak grad: 96.8 mmHg
Ao pk vel: 4.92 m/s
Area-P 1/2: 2.2 cm2
S' Lateral: 2.59 cm

## 2024-03-10 ENCOUNTER — Other Ambulatory Visit: Payer: Self-pay | Admitting: Physician Assistant

## 2024-03-10 DIAGNOSIS — I10 Essential (primary) hypertension: Secondary | ICD-10-CM

## 2024-03-10 DIAGNOSIS — E1159 Type 2 diabetes mellitus with other circulatory complications: Secondary | ICD-10-CM

## 2024-03-13 ENCOUNTER — Ambulatory Visit: Attending: Cardiology | Admitting: Cardiology

## 2024-03-13 ENCOUNTER — Encounter: Payer: Self-pay | Admitting: Cardiology

## 2024-03-13 VITALS — BP 134/70 | HR 64 | Ht 62.0 in | Wt 199.0 lb

## 2024-03-13 DIAGNOSIS — Z794 Long term (current) use of insulin: Secondary | ICD-10-CM

## 2024-03-13 DIAGNOSIS — E782 Mixed hyperlipidemia: Secondary | ICD-10-CM

## 2024-03-13 DIAGNOSIS — I350 Nonrheumatic aortic (valve) stenosis: Secondary | ICD-10-CM | POA: Diagnosis not present

## 2024-03-13 DIAGNOSIS — I10 Essential (primary) hypertension: Secondary | ICD-10-CM

## 2024-03-13 DIAGNOSIS — R001 Bradycardia, unspecified: Secondary | ICD-10-CM

## 2024-03-13 DIAGNOSIS — E1165 Type 2 diabetes mellitus with hyperglycemia: Secondary | ICD-10-CM

## 2024-03-13 NOTE — Progress Notes (Signed)
 Cardiology Office Note:  .   Date:  03/13/2024  ID:  Shari Lee, DOB 31-Mar-1956, MRN [id number] PCP: Shari Frock, PA-C  [facility] Providers Cardiologist:  Shari Delton, MD    History of Present Illness: .   Shari Lee is a 68 y.o. female with a past medical history of primary hypertension, type 2 diabetes, aortic stenosis, heart murmur, who presents today for follow-up.   She was seen in clinic 02/11/2022 by Dr.Agbor-Etang.  She was.  Echocardiogram she had previously been evaluated by her PCP and was noted to have a cardiac murmur.  Echocardiogram was obtained to evaluate any structural abnormalities.  Patient clinically was asymptomatic denying chest pain shortness of breath or palpitations.  Echocardiogram revealing normal EF 60 to 65%, moderate aortic valve stenosis with mean gradient 25 mmHg recommendation was for repeat echo yearly.    She was last seen in clinic 02/01/24 stating that overall from a cardiac perspective she has been doing well.  She denies any shortness of breath, chest pain, peripheral edema, lightheadedness, dizziness.  She did have 30 pound weight loss and also states she had ended up with pancreatitis on Ozempic .  She was scheduled for an updated echocardiogram for aortic valve stenosis.  There were no further medication changes that were made.  She returns to clinic today with continued exertional dyspnea, occasional lightheadedness and dizziness when getting out of the bed but denies any syncope or near syncope, increased fatigue with any activity.  Last episode of fatigue requiring resting was with her shower this morning.  She denies any peripheral edema.  Has been taking her medication as prescribed without adverse side effects.  Denies any recent hospitalizations or visits to the emergency department.  ROS: 10 point review of systems has been reviewed and considered negative except was been listed in the HPI  Studies Reviewed: Shari Lee   EKG  Interpretation Date/Time:  Tuesday Mar 13 2024 15:30:47 EDT Ventricular Rate:  58 PR Interval:  206 QRS Duration:  82 QT Interval:  434 QTC Calculation: 426 R Axis:   18  Text Interpretation: Sinus bradycardia , first degree AV block Possible Anterior infarct (cited on or before 01-Feb-2024) When compared with ECG of 01-Feb-2024 15:08, No significant change was found Confirmed by Shari Lee ([id number]) on 03/13/2024 3:38:26 PM    2D echo 03/05/2024 1. Left ventricular ejection fraction, by estimation, is 60 to 65%. Left  ventricular ejection fraction by PLAX is 63 %. The left ventricle has  normal function. The left ventricle has no regional wall motion  abnormalities. There is mild left ventricular  hypertrophy. Left ventricular diastolic parameters are consistent with  Grade I diastolic dysfunction (impaired relaxation).   2. Right ventricular systolic function is normal. The right ventricular  size is normal.   3. The mitral valve is grossly normal. No evidence of mitral valve  regurgitation.   4. The aortic valve is bicuspid. Aortic valve regurgitation is not  visualized. Severe aortic valve stenosis. Aortic valve area, by VTI  measures 0.58 cm. Aortic valve mean gradient measures 48.0 mmHg. Aortic  valve Vmax measures 4.92 m/s.   5. The inferior vena cava is normal in size with greater than 50%  respiratory variability, suggesting right atrial pressure of 3 mmHg.   2D echo 01/27/2022 1. Left ventricular ejection fraction, by estimation, is 60 to 65%. The  left ventricle has normal function. The left ventricle has no regional  wall motion abnormalities. There is moderate left ventricular hypertrophy.  Left ventricular diastolic  parameters are consistent with Grade I diastolic dysfunction (impaired  relaxation). The average left ventricular global longitudinal strain is  -17.5 %. The global longitudinal strain is normal.   2. Right ventricular systolic function is normal. The  right ventricular  size is normal.   3. Left atrial size was mildly dilated.   4. The mitral valve is normal in structure. No evidence of mitral valve  regurgitation. No evidence of mitral stenosis.   5. The aortic valve is normal in structure. There is moderate  calcification of the aortic valve. Aortic valve regurgitation is not  visualized. Moderate aortic valve stenosis. Aortic valve area, by VTI  measures 0.82 cm. Aortic valve mean gradient  measures 25.3 mmHg. Aortic valve Vmax measures 3.36 m/s.   6. The inferior vena cava is normal in size with greater than 50%  respiratory variability, suggesting right atrial pressure of 3 mmHg.  Risk Assessment/Calculations:             Physical Exam:   VS:  BP 134/70 (BP Location: Left Arm)   Pulse 64   Ht 5\' 2"  (1.575 m)   Wt 199 lb (90.3 kg)   SpO2 97%   BMI 36.40 kg/m    Wt Readings from Last 3 Encounters:  03/13/24 199 lb (90.3 kg)  02/01/24 196 lb (88.9 kg)  01/24/24 195 lb 3.2 oz (88.5 kg)    GEN: Well nourished, well developed in no acute distress NECK: No JVD; No carotid bruits CARDIAC: RRR, II/VI systolic murmur at L/RUSB without rubs or gallops RESPIRATORY:  Clear to auscultation without rales, wheezing or rhonchi  ABDOMEN: Soft, non-tender, non-distended EXTREMITIES:  No edema; No deformity   ASSESSMENT AND PLAN: .   Severe aortic valve stenosis noted on recent echocardiogram VTI measures 0.58 cm with aortic valve mean gradient measured 40 mmHg, aortic valve V-max measured 4.92 m/s.  With shortness of breath lightheadedness, dizziness, palpitations.  She has been scheduled for left heart catheterization for further evaluation of her aortic valve stenosis.  If the valve area remains severe we will refer her to structural heart team for workup for valve replacement.  If she is not at the point of needing her valve replaced will continue with surveillance studies every 6 months.  Primary hypertension with blood pressure  today 134/78.  Blood pressure remained stable.  She is continued on amlodipine /benazepril  5/20 mg daily and atenolol  50 mg daily.  She has been encouraged to continue to monitor her blood pressures 1 to 2 hours postmedication administration as well.  Sinus bradycardia with first-degree AV block noted on EKG today with a rate of 58.  Could be secondary to severe aortic valve stenosis.  She is chronotropically competent.  Will continue atenolol  50 mg daily at this time but if any further progression of bradycardia or bifascicular blocks will reduce beta-blocker therapy.  Type 2 diabetes with last A1c of 9.0.  She is continued on insulin , Farxiga  10 mg daily, [name], and ongoing management per PCP.  Mixed hyperlipidemia with an LDL of 110.  She is currently continued on ezetimibe  10 mg daily.  This continues to be managed by her PCP.    Informed Consent   Shared Decision Making/Informed Consent The risks [stroke (1 in 1000), death (1 in 1000), kidney failure [usually temporary] (1 in 500), bleeding (1 in 200), allergic reaction [possibly serious] (1 in 200)], benefits (diagnostic support and management of coronary artery disease) and alternatives of a cardiac catheterization were  discussed in detail with Shari Lee and she is willing to proceed.     Dispo: Patient to return to clinic to see MD/APP in 2 to 3 weeks postprocedure or sooner if needed  Signed, Analucia Hush, NP

## 2024-03-13 NOTE — Patient Instructions (Addendum)
 Medication Instructions:  Your Physician recommend you continue on your current medication as directed.    *If you need a refill on your cardiac medications before your next appointment, please call your pharmacy*  Lab Work: Your provider would like for you to have following labs drawn today BMP, CBC.   If you have labs (blood work) drawn today and your tests are completely normal, you will receive your results only by: MyChart Message (if you have MyChart) OR A paper copy in the mail If you have any lab test that is abnormal or we need to change your treatment, we will call you to review the results.  Testing/Procedures:  [place] A DEPT OF [place] [place] [address] [place] [address] [place] [address] Dept: [phone] Loc: [phone]  Crystale Giannattasio  03/13/2024  You are scheduled for a Cardiac Catheterization on Monday, June 9 with Dr. Antionette Kirks.  1. Please arrive at the [facility] Entrance of [facility], [address], [place] [address] at 8:30 AM (This is 1 hour(s) prior to your procedure time).  Proceed to the Check-In Desk directly inside the entrance.  Procedure Parking: Use the entrance off of the [address] side of the hospital. Turn right upon entering and follow the driveway to parking that is directly in front of the Heart & Vascular Center. There is no valet parking available at this entrance, however there is an awning directly in front of the Heart & Vascular Center for drop off/ pick up for patients.  Special note: Every effort is made to have your procedure done on time. Please understand that emergencies sometimes delay scheduled procedures.  2. Diet: Do not eat solid foods after midnight.  The patient may have clear liquids until 5am upon the day of the procedure.  3. Labs: You will need to have blood drawn on Tuesday, May 27 at [facility] Entrance, Go to  1st desk on your right to register.  Address: [address] [place], [place] [address]  Open: 8am - 5pm  Phone: [phone]. You do not need to be fasting.  4. Medication instructions in preparation for your procedure:   Contrast Allergy: No   Hold Farxiga  and [name] the morning of procedure   On the morning of your procedure, take any morning medicines NOT listed above.  You may use sips of water .  5. Plan to go home the same day, you will only stay overnight if medically necessary. 6. Bring a current list of your medications and current insurance cards. 7. You MUST have a responsible person to drive you home. 8. Someone MUST be with you the first 24 hours after you arrive home or your discharge will be delayed. 9. Please wear clothes that are easy to get on and off and wear slip-on shoes.  Thank you for allowing us  to care for you!   -- [facility]   Follow-Up: At [facility], you and your health needs are our priority.  As part of our continuing mission to provide you with exceptional heart care, our providers are all part of one team.  This team includes your primary Cardiologist (physician) and Advanced Practice Providers or APPs (Physician Assistants and Nurse Practitioners) who all work together to provide you with the care you need, when you need it.  Your next appointment:   2 - 3 week(s)  Provider:   Constancia Delton,  MD or Ronald Cockayne, NP

## 2024-03-13 NOTE — H&P (View-Only) (Signed)
 Cardiology Office Note:  .   Date:  03/13/2024  ID:  Shari Lee, DOB 31-Mar-1956, MRN [id number] PCP: Trenton Frock, PA-C  [facility] Providers Cardiologist:  Constancia Delton, MD    History of Present Illness: .   Shari Lee is a 68 y.o. female with a past medical history of primary hypertension, type 2 diabetes, aortic stenosis, heart murmur, who presents today for follow-up.   She was seen in clinic 02/11/2022 by Dr.Agbor-Etang.  She was.  Echocardiogram she had previously been evaluated by her PCP and was noted to have a cardiac murmur.  Echocardiogram was obtained to evaluate any structural abnormalities.  Patient clinically was asymptomatic denying chest pain shortness of breath or palpitations.  Echocardiogram revealing normal EF 60 to 65%, moderate aortic valve stenosis with mean gradient 25 mmHg recommendation was for repeat echo yearly.    She was last seen in clinic 02/01/24 stating that overall from a cardiac perspective she has been doing well.  She denies any shortness of breath, chest pain, peripheral edema, lightheadedness, dizziness.  She did have 30 pound weight loss and also states she had ended up with pancreatitis on Ozempic .  She was scheduled for an updated echocardiogram for aortic valve stenosis.  There were no further medication changes that were made.  She returns to clinic today with continued exertional dyspnea, occasional lightheadedness and dizziness when getting out of the bed but denies any syncope or near syncope, increased fatigue with any activity.  Last episode of fatigue requiring resting was with her shower this morning.  She denies any peripheral edema.  Has been taking her medication as prescribed without adverse side effects.  Denies any recent hospitalizations or visits to the emergency department.  ROS: 10 point review of systems has been reviewed and considered negative except was been listed in the HPI  Studies Reviewed: Aaron Aas   EKG  Interpretation Date/Time:  Tuesday Mar 13 2024 15:30:47 EDT Ventricular Rate:  58 PR Interval:  206 QRS Duration:  82 QT Interval:  434 QTC Calculation: 426 R Axis:   18  Text Interpretation: Sinus bradycardia , first degree AV block Possible Anterior infarct (cited on or before 01-Feb-2024) When compared with ECG of 01-Feb-2024 15:08, No significant change was found Confirmed by Ronald Cockayne ([id number]) on 03/13/2024 3:38:26 PM    2D echo 03/05/2024 1. Left ventricular ejection fraction, by estimation, is 60 to 65%. Left  ventricular ejection fraction by PLAX is 63 %. The left ventricle has  normal function. The left ventricle has no regional wall motion  abnormalities. There is mild left ventricular  hypertrophy. Left ventricular diastolic parameters are consistent with  Grade I diastolic dysfunction (impaired relaxation).   2. Right ventricular systolic function is normal. The right ventricular  size is normal.   3. The mitral valve is grossly normal. No evidence of mitral valve  regurgitation.   4. The aortic valve is bicuspid. Aortic valve regurgitation is not  visualized. Severe aortic valve stenosis. Aortic valve area, by VTI  measures 0.58 cm. Aortic valve mean gradient measures 48.0 mmHg. Aortic  valve Vmax measures 4.92 m/s.   5. The inferior vena cava is normal in size with greater than 50%  respiratory variability, suggesting right atrial pressure of 3 mmHg.   2D echo 01/27/2022 1. Left ventricular ejection fraction, by estimation, is 60 to 65%. The  left ventricle has normal function. The left ventricle has no regional  wall motion abnormalities. There is moderate left ventricular hypertrophy.  Left ventricular diastolic  parameters are consistent with Grade I diastolic dysfunction (impaired  relaxation). The average left ventricular global longitudinal strain is  -17.5 %. The global longitudinal strain is normal.   2. Right ventricular systolic function is normal. The  right ventricular  size is normal.   3. Left atrial size was mildly dilated.   4. The mitral valve is normal in structure. No evidence of mitral valve  regurgitation. No evidence of mitral stenosis.   5. The aortic valve is normal in structure. There is moderate  calcification of the aortic valve. Aortic valve regurgitation is not  visualized. Moderate aortic valve stenosis. Aortic valve area, by VTI  measures 0.82 cm. Aortic valve mean gradient  measures 25.3 mmHg. Aortic valve Vmax measures 3.36 m/s.   6. The inferior vena cava is normal in size with greater than 50%  respiratory variability, suggesting right atrial pressure of 3 mmHg.  Risk Assessment/Calculations:             Physical Exam:   VS:  BP 134/70 (BP Location: Left Arm)   Pulse 64   Ht 5\' 2"  (1.575 m)   Wt 199 lb (90.3 kg)   SpO2 97%   BMI 36.40 kg/m    Wt Readings from Last 3 Encounters:  03/13/24 199 lb (90.3 kg)  02/01/24 196 lb (88.9 kg)  01/24/24 195 lb 3.2 oz (88.5 kg)    GEN: Well nourished, well developed in no acute distress NECK: No JVD; No carotid bruits CARDIAC: RRR, II/VI systolic murmur at L/RUSB without rubs or gallops RESPIRATORY:  Clear to auscultation without rales, wheezing or rhonchi  ABDOMEN: Soft, non-tender, non-distended EXTREMITIES:  No edema; No deformity   ASSESSMENT AND PLAN: .   Severe aortic valve stenosis noted on recent echocardiogram VTI measures 0.58 cm with aortic valve mean gradient measured 40 mmHg, aortic valve V-max measured 4.92 m/s.  With shortness of breath lightheadedness, dizziness, palpitations.  She has been scheduled for left heart catheterization for further evaluation of her aortic valve stenosis.  If the valve area remains severe we will refer her to structural heart team for workup for valve replacement.  If she is not at the point of needing her valve replaced will continue with surveillance studies every 6 months.  Primary hypertension with blood pressure  today 134/78.  Blood pressure remained stable.  She is continued on amlodipine /benazepril  5/20 mg daily and atenolol  50 mg daily.  She has been encouraged to continue to monitor her blood pressures 1 to 2 hours postmedication administration as well.  Sinus bradycardia with first-degree AV block noted on EKG today with a rate of 58.  Could be secondary to severe aortic valve stenosis.  She is chronotropically competent.  Will continue atenolol  50 mg daily at this time but if any further progression of bradycardia or bifascicular blocks will reduce beta-blocker therapy.  Type 2 diabetes with last A1c of 9.0.  She is continued on insulin , Farxiga  10 mg daily, [name], and ongoing management per PCP.  Mixed hyperlipidemia with an LDL of 110.  She is currently continued on ezetimibe  10 mg daily.  This continues to be managed by her PCP.    Informed Consent   Shared Decision Making/Informed Consent The risks [stroke (1 in 1000), death (1 in 1000), kidney failure [usually temporary] (1 in 500), bleeding (1 in 200), allergic reaction [possibly serious] (1 in 200)], benefits (diagnostic support and management of coronary artery disease) and alternatives of a cardiac catheterization were  discussed in detail with Ms. Savoca and she is willing to proceed.     Dispo: Patient to return to clinic to see MD/APP in 2 to 3 weeks postprocedure or sooner if needed  Signed, Analucia Hush, NP

## 2024-03-14 ENCOUNTER — Ambulatory Visit: Payer: Self-pay

## 2024-03-14 LAB — BASIC METABOLIC PANEL WITH GFR
BUN/Creatinine Ratio: 15 (ref 12–28)
BUN: 14 mg/dL (ref 8–27)
CO2: 20 mmol/L (ref 20–29)
Calcium: 9.5 mg/dL (ref 8.7–10.3)
Chloride: 102 mmol/L (ref 96–106)
Creatinine, Ser: 0.94 mg/dL (ref 0.57–1.00)
Glucose: 197 mg/dL — ABNORMAL HIGH (ref 70–99)
Potassium: 4.7 mmol/L (ref 3.5–5.2)
Sodium: 139 mmol/L (ref 134–144)
eGFR: 67 mL/min/{1.73_m2} (ref >59)

## 2024-03-14 LAB — CBC
Hematocrit: 44.1 % (ref 34.0–46.6)
Hemoglobin: 13.5 g/dL (ref 11.1–15.9)
MCH: 27.7 pg (ref 26.6–33.0)
MCHC: 30.6 g/dL — ABNORMAL LOW (ref 31.5–35.7)
MCV: 90 fL (ref 79–97)
Platelets: 358 10*3/uL (ref 150–450)
RBC: 4.88 x10E6/uL (ref 3.77–5.28)
RDW: 12.7 % (ref 11.7–15.4)
WBC: 12.9 10*3/uL — ABNORMAL HIGH (ref 3.4–10.8)

## 2024-03-14 NOTE — Progress Notes (Signed)
 Kidney function remains stable, potassium is stable, slight elevation in white blood cell count noted and elevated blood glucose levels.  Continue current medication regimen at this time with no needed changes.

## 2024-03-22 ENCOUNTER — Telehealth: Payer: Self-pay | Admitting: Pharmacist

## 2024-03-22 NOTE — Telephone Encounter (Signed)
 Called patient today to follow-up diabetes and medication management. She was not at home today and asked if I could call back next week. She is scheduled for cardiac cath 03/26/2024.  Phone Visit planned for 11:15am on 03/29/2024  Cecilie Coffee, PharmD Clinical Pharmacist [facility]  Population Health [phone]

## 2024-03-26 ENCOUNTER — Other Ambulatory Visit: Payer: Self-pay

## 2024-03-26 ENCOUNTER — Encounter
Admission: RE | Disposition: A | Discharge status: Home / Self Care | Payer: Self-pay | Source: Home / Self Care | Attending: Cardiovascular Disease

## 2024-03-26 ENCOUNTER — Other Ambulatory Visit: Payer: Self-pay | Admitting: *Deleted

## 2024-03-26 ENCOUNTER — Ambulatory Visit
Admission: RE | Admit: 2024-03-26 | Discharge: 2024-03-26 | Disposition: A | Discharge status: Home / Self Care | Attending: Cardiovascular Disease | Admitting: Cardiovascular Disease

## 2024-03-26 ENCOUNTER — Encounter: Payer: Self-pay | Admitting: Cardiovascular Disease

## 2024-03-26 DIAGNOSIS — I350 Nonrheumatic aortic (valve) stenosis: Secondary | ICD-10-CM | POA: Diagnosis present

## 2024-03-26 DIAGNOSIS — Z79899 Other long term (current) drug therapy: Secondary | ICD-10-CM | POA: Diagnosis not present

## 2024-03-26 DIAGNOSIS — I10 Essential (primary) hypertension: Secondary | ICD-10-CM | POA: Insufficient documentation

## 2024-03-26 DIAGNOSIS — E782 Mixed hyperlipidemia: Secondary | ICD-10-CM | POA: Diagnosis not present

## 2024-03-26 DIAGNOSIS — E119 Type 2 diabetes mellitus without complications: Secondary | ICD-10-CM | POA: Diagnosis not present

## 2024-03-26 DIAGNOSIS — I440 Atrioventricular block, first degree: Secondary | ICD-10-CM | POA: Insufficient documentation

## 2024-03-26 DIAGNOSIS — Z794 Long term (current) use of insulin: Secondary | ICD-10-CM | POA: Diagnosis not present

## 2024-03-26 HISTORY — PX: RIGHT HEART CATH AND CORONARY ANGIOGRAPHY: CATH118264

## 2024-03-26 LAB — POCT I-STAT 7, (LYTES, BLD GAS, ICA,H+H)
Acid-base deficit: 3 mmol/L — ABNORMAL HIGH (ref 0.0–2.0)
Bicarbonate: 23 mmol/L (ref 20.0–28.0)
Calcium, Ion: 1.3 mmol/L (ref 1.15–1.40)
HCT: 37 % (ref 36.0–46.0)
Hemoglobin: 12.6 g/dL (ref 12.0–15.0)
O2 Saturation: 98 %
Potassium: 4.3 mmol/L (ref 3.5–5.1)
Sodium: 139 mmol/L (ref 135–145)
TCO2: 24 mmol/L (ref 22–32)
pCO2 arterial: 42.5 mmHg (ref 32–48)
pH, Arterial: 7.341 — ABNORMAL LOW (ref 7.35–7.45)
pO2, Arterial: 110 mmHg — ABNORMAL HIGH (ref 83–108)

## 2024-03-26 LAB — POCT I-STAT EG7
Acid-base deficit: 2 mmol/L (ref 0.0–2.0)
Bicarbonate: 25 mmol/L (ref 20.0–28.0)
Calcium, Ion: 1.27 mmol/L (ref 1.15–1.40)
HCT: 38 % (ref 36.0–46.0)
Hemoglobin: 12.9 g/dL (ref 12.0–15.0)
O2 Saturation: 73 %
Potassium: 4.3 mmol/L (ref 3.5–5.1)
Sodium: 140 mmol/L (ref 135–145)
TCO2: 26 mmol/L (ref 22–32)
pCO2, Ven: 48.2 mmHg (ref 44–60)
pH, Ven: 7.322 (ref 7.25–7.43)
pO2, Ven: 42 mmHg (ref 32–45)

## 2024-03-26 LAB — GLUCOSE, CAPILLARY
Glucose-Capillary: 253 mg/dL — ABNORMAL HIGH (ref 70–99)
Glucose-Capillary: 131 mg/dL — ABNORMAL HIGH (ref 70–99)

## 2024-03-26 SURGERY — RIGHT HEART CATH AND CORONARY ANGIOGRAPHY
Anesthesia: Moderate Sedation

## 2024-03-26 MED ORDER — FENTANYL CITRATE (PF) 100 MCG/2ML IJ SOLN
INTRAMUSCULAR | Status: DC | PRN
Start: 2024-03-26 — End: 2024-03-26
  Administered 2024-03-26: 25 ug via INTRAVENOUS

## 2024-03-26 MED ORDER — SODIUM CHLORIDE 0.9 % IV SOLN: INTRAVENOUS | Status: DC

## 2024-03-26 MED ORDER — FENTANYL CITRATE (PF) 100 MCG/2ML IJ SOLN
INTRAMUSCULAR | Status: AC
  Filled 2024-03-26: qty 2

## 2024-03-26 MED ORDER — HEPARIN (PORCINE) IN NACL 1000-0.9 UT/500ML-% IV SOLN
INTRAVENOUS | Status: AC
Start: 2024-03-26 — End: ?
  Filled 2024-03-26: qty 1000

## 2024-03-26 MED ORDER — VERAPAMIL HCL 2.5 MG/ML IV SOLN
INTRAVENOUS | Status: AC
  Filled 2024-03-26: qty 2

## 2024-03-26 MED ORDER — VERAPAMIL HCL 2.5 MG/ML IV SOLN
INTRAVENOUS | Status: DC | PRN
  Administered 2024-03-26: 2.5 mg via INTRAVENOUS

## 2024-03-26 MED ORDER — MIDAZOLAM HCL 2 MG/2ML IJ SOLN
INTRAMUSCULAR | Status: DC | PRN
  Administered 2024-03-26: 1 mg via INTRAVENOUS

## 2024-03-26 MED ORDER — IOHEXOL 300 MG/ML  SOLN
INTRAMUSCULAR | Status: DC | PRN
  Administered 2024-03-26: 46 mL

## 2024-03-26 MED ORDER — SODIUM CHLORIDE 0.9 % IV SOLN: 250.0000 mL | INTRAVENOUS | Status: DC | PRN

## 2024-03-26 MED ORDER — SODIUM CHLORIDE 0.9% FLUSH: 3.0000 mL | INTRAVENOUS | Status: DC | PRN

## 2024-03-26 MED ORDER — ONDANSETRON HCL 4 MG/2ML IJ SOLN: 4.0000 mg | Freq: Four times a day (QID) | INTRAMUSCULAR | Status: DC | PRN

## 2024-03-26 MED ORDER — HEPARIN SODIUM (PORCINE) 1000 UNIT/ML IJ SOLN
INTRAMUSCULAR | Status: DC | PRN
  Administered 2024-03-26: 4500 [IU] via INTRAVENOUS

## 2024-03-26 MED ORDER — ASPIRIN 81 MG PO CHEW
81.0000 mg | CHEWABLE_TABLET | ORAL | Status: AC
  Administered 2024-03-26: 81 mg via ORAL

## 2024-03-26 MED ORDER — HEPARIN SODIUM (PORCINE) 1000 UNIT/ML IJ SOLN
INTRAMUSCULAR | Status: AC
  Filled 2024-03-26: qty 10

## 2024-03-26 MED ORDER — ASPIRIN 81 MG PO CHEW
CHEWABLE_TABLET | ORAL | Status: AC
  Filled 2024-03-26: qty 1

## 2024-03-26 MED ORDER — MIDAZOLAM HCL 2 MG/2ML IJ SOLN
INTRAMUSCULAR | Status: AC
  Filled 2024-03-26: qty 2

## 2024-03-26 MED ORDER — LIDOCAINE HCL 1 % IJ SOLN
INTRAMUSCULAR | Status: AC
  Filled 2024-03-26: qty 20

## 2024-03-26 MED ORDER — LIDOCAINE HCL (PF) 1 % IJ SOLN
INTRAMUSCULAR | Status: DC | PRN
  Administered 2024-03-26: 5 mL

## 2024-03-26 MED ORDER — ACETAMINOPHEN 325 MG PO TABS
650.0000 mg | ORAL_TABLET | ORAL | Status: DC | PRN
Start: 2024-03-26 — End: 2024-03-26

## 2024-03-26 MED ORDER — HEPARIN (PORCINE) IN NACL 1000-0.9 UT/500ML-% IV SOLN
INTRAVENOUS | Status: DC | PRN
  Administered 2024-03-26 (×2): 500 mL

## 2024-03-26 MED ORDER — SODIUM CHLORIDE 0.9% FLUSH
3.0000 mL | Freq: Two times a day (BID) | INTRAVENOUS | Status: DC
Start: 2024-03-26 — End: 2024-03-26

## 2024-03-26 SURGICAL SUPPLY — 13 items
CATH BALLN WEDGE 5F 110CM (CATHETERS) ×1 IMPLANT
CATH INFINITI AMBI 5FR JK (CATHETERS) ×1 IMPLANT
CATH INFINITI JR4 5F (CATHETERS) ×1 IMPLANT
DEVICE RAD TR BAND REGULAR (VASCULAR PRODUCTS) ×1 IMPLANT
DRAPE BRACHIAL (DRAPES) ×2 IMPLANT
GLIDESHEATH SLEND SS 6F .021 (SHEATH) ×1 IMPLANT
GUIDEWIRE INQWIRE 1.5J.035X260 (WIRE) ×1 IMPLANT
KIT SYRINGE INJ CVI SPIKEX1 (MISCELLANEOUS) ×1 IMPLANT
PACK CARDIAC CATH (CUSTOM PROCEDURE TRAY) ×2 IMPLANT
SET ATX-X65L (MISCELLANEOUS) ×1 IMPLANT
SHEATH GLIDE SLENDER 4/5FR (SHEATH) ×1 IMPLANT
STATION PROTECTION PRESSURIZED (MISCELLANEOUS) ×1 IMPLANT
TUBING CIL FLEX 10 FLL-RA (TUBING) IMPLANT

## 2024-03-26 NOTE — Interval H&P Note (Signed)
 History and Physical Interval Note:  03/26/2024 10:01 AM  Shari Lee  has presented today for surgery, with the diagnosis of L Cath   Severe aortic stenosis.  The various methods of treatment have been discussed with the patient and family. After consideration of risks, benefits and other options for treatment, the patient has consented to  Procedure(s): LEFT HEART CATH AND CORONARY ANGIOGRAPHY (Left) as a surgical intervention.  The patient's history has been reviewed, patient examined, no change in status, stable for surgery.  I have reviewed the patient's chart and labs.  Questions were answered to the patient's satisfaction.     Dreshawn Hendershott

## 2024-03-27 ENCOUNTER — Encounter: Payer: Self-pay | Admitting: Cardiovascular Disease

## 2024-03-29 ENCOUNTER — Other Ambulatory Visit: Payer: Self-pay | Admitting: Pharmacist

## 2024-03-29 ENCOUNTER — Encounter: Payer: Self-pay | Admitting: Pharmacist

## 2024-03-29 DIAGNOSIS — E1165 Type 2 diabetes mellitus with hyperglycemia: Secondary | ICD-10-CM

## 2024-03-29 NOTE — Progress Notes (Signed)
 03/29/2024 Name: Marcey Persad MRN: [id number] DOB: January 19, 1956  Chief Complaint  Patient presents with   Medication Management   Diabetes    Sopheap Basic is a 68 y.o. year old female who presented for a telephone visit.   They were referred to the pharmacist by their PCP for assistance in managing diabetes and medication access.    Subjective:   Medication Access/Adherence  Current Pharmacy:  [facility] [address] ([address]), [place] - [address] [place]) [place] [address] Phone: [phone] Fax: [phone]  [facility] - [place], [place] - [address] [place] [address] Phone: [phone] Fax: [phone]   Patient reports affordability concerns with their medications: Yes  Patient reports access/transportation concerns to their pharmacy: No  Patient reports adherence concerns with their medications:  Yes      Diabetes:  Current medications:  [name] 23 units twice a day - approved to get thru [employer] thru 10/17/2024 Farxiga  10mg  daily - getting from [facility] and Me patient assistance program - approved thru 10/17/2024   She was previously was following a low CHO but high meat / protein diet. Blood glucose was really well controlled by she has not been able to sustain this type of eating plan. She reports she tired of eating so much meat and is just too busy to manage her diet.     Medications tried in the past: Ozempic  - stopped due to pancreatitis; metformin  - worsened IBS related diarrhea   Date of Download: 03/29/2024 % Time CGM is active: 97%  Average Glucose: 266 mg/dL (previously 403 mg/dL) Glucose Management Indicator: 9.7% (preciously was 6.0%) Glucose Variability: 28.7% (goal <36%) (previously was 20.2%) Time in Goal:  - Time in range 70-180: 14% (previously 97%) - Time above range: 86% ( previously 2%) - Time below range: 0% ( previously 1%)      Wt Readings from Last 3  Encounters:  03/26/24 199 lb 4.8 oz (90.4 kg)  03/13/24 199 lb (90.3 kg)  02/01/24 196 lb (88.9 kg)   Hyperlipidemia/ASCVD Risk Reduction  Current lipid lowering medications: ezetimibe  10mg  daily Medications tried in the past: rosuvastatin  - patient stop because she felt It was affecting her memory.   Objective:  Lab Results  Component Value Date   HGBA1C 9.0 (H) 01/24/2024   BP Readings from Last 3 Encounters:  03/26/24 (!) 149/66  03/13/24 134/70  02/01/24 130/78     Lab Results  Component Value Date   CREATININE 0.94 03/13/2024   BUN 14 03/13/2024   NA 140 03/26/2024   K 4.3 03/26/2024   CL 102 03/13/2024   CO2 20 03/13/2024    Lab Results  Component Value Date   CHOL 189 01/24/2024   HDL 55.40 01/24/2024   LDLCALC 110 (H) 01/24/2024   TRIG 119.0 01/24/2024   CHOLHDL 3 01/24/2024    Medications Reviewed Today   Medications were not reviewed in this encounter       Assessment/Plan:  Diabetes: blood glucose and GMI over the last 2 weeks has not been at goal. Blood glucose is very dependent on her diet choices - Reviewed goal A1c, goal fasting, and goal 2 hour post prandial glucose - Reviewed dietary modifications - recommended she try increasing lower carbohydrate foods to her meals - green beans, peppers, onions, broccoli, cauliflower, zuchini, squash, lettuce, greens.  - Increase [name] to 25 units twice a day - Discuss adding meal time  insulin  / Novolog  - patient is on board with this. Will discuss with her PCP.  - Continue Farxiga  10mg  daily.  - Continue to use Libre 3 sensors to check blood glucose continuously.    Hyperlipidemia/ASCVD Risk Reduction:Currently uncontrolled.  - Reviewed long term complications of uncontrolled cholesterol - continue ezetimibe  10mg  daily    Follow Up Plan: 2 weeks.   Cecilie Coffee, PharmD Clinical Pharmacist [facility]  Population Health [phone]

## 2024-03-30 NOTE — Progress Notes (Unsigned)
 Patient ID: Shari Lee MRN: [id number] DOB/AGE: Nov 18, 1955 68 y.o.  Primary Care Physician:Drubel, Victorino Grates Primary Cardiologist: Junnie Olives  CC:  Aortic valvular disease management     FOCUSED PROBLEM LIST:   Aortic stenosis (bicuspid) AVA 0.58, MG 48, V-max 4.9, EF 60 to 65% TTE May 2025 EKG sinus bradycardia first-degree AV block CAD Mild; coronary angiography 2025 T2DM On insulin  Hyperlipidemia Hypertension CKD stage II BMI 36/BSA 2.0 Intolerant of Ozempic   June 2025:  Patient consents to use of AI scribe. The patient is a 68 year old female with above listed medical problems referred for recommendations regarding her severe bicuspid aortic stenosis.  The patient was seen by general cardiology in late May.  At that point in time she reported occasional lightheadedness, exertional dyspnea, and increasing fatigue.  An echocardiogram was performed which demonstrated severe near critical bicuspid aortic stenosis.  She is referred for coronary angiography which demonstrated mild nonobstructive disease.  Her cardiac hemodynamics were relatively normal.  She is referred for further recommendations.  Over the past two years, she has experienced worsening shortness of breath, fatigue, and dizziness, significantly impacting her daily activities such as walking and cleaning. Shortness of breath occurs with minimal exertion, such as walking from the parking lot to the clinic. Her symptoms worsened significantly after a COVID-19 infection approximately three years ago.  She experiences tingling and tightness in her legs, with persistent swelling, and one leg appearing larger than the other. Ankle swelling is occasional. She experiences lightheadedness, particularly when standing quickly. No palpitations or blacking out spells.  She reports right-sided chest pain that has been present for a long time, sometimes feeling tight, especially when nervous. No issues with breathing while  lying flat.  She has a history of diabetes managed with insulin  and is on medications for high blood pressure and cholesterol, including amlodipine  and Farxiga .  Her dental health is poor, with deteriorating bones and bleeding gums. She had a deep cleaning six months ago and is scheduled for a follow-up in July.       Past Medical History:  Diagnosis Date   Asthma    mild   Diarrhea    N/V recently   Dyspnea    GERD (gastroesophageal reflux disease)    Headache    Heart murmur    developed in last couple years/ no issues   HOH (hard of hearing)    wears aids   Hypertension    Motion sickness    car   Type 2 diabetes mellitus with hyperglycemia [facility])     Past Surgical History:  Procedure Laterality Date   ABDOMINAL HYSTERECTOMY  2006   BREAST BIOPSY Left 2005   benign   BREAST EXCISIONAL BIOPSY Left 2005?    -benign   CATARACT EXTRACTION W/PHACO Right 11/30/2022   Procedure: CATARACT EXTRACTION PHACO AND INTRAOCULAR LENS PLACEMENT (IOC) RIGHT DIABETIC;  Surgeon: Clair Crews, MD;  Location: [facility];  Service: Ophthalmology;  Laterality: Right;  4.96 0:37.3   CATARACT EXTRACTION W/PHACO Left 12/14/2022   Procedure: CATARACT EXTRACTION PHACO AND INTRAOCULAR LENS PLACEMENT (IOC) LEFT DIABETIC;  Surgeon: Clair Crews, MD;  Location: [facility];  Service: Ophthalmology;  Laterality: Left;  5.98 0:40.6   COLONOSCOPY WITH PROPOFOL  N/A 08/13/2016   Procedure: COLONOSCOPY WITH PROPOFOL ;  Surgeon: Marnee Sink, MD;  Location: [facility];  Service: Endoscopy;  Laterality: N/A;   COLONOSCOPY WITH PROPOFOL  N/A 02/09/2022   Procedure: COLONOSCOPY WITH PROPOFOL ;  Surgeon: Marnee Sink, MD;  Location: [facility] END[facility];  Service: Endoscopy;  Laterality: N/A;   POLYPECTOMY  08/13/2016   Procedure: POLYPECTOMY;  Surgeon: Marnee Sink, MD;  Location: [facility];  Service: Endoscopy;;   RIGHT HEART CATH AND CORONARY ANGIOGRAPHY N/A 03/26/2024    Procedure: RIGHT HEART CATH AND CORONARY ANGIOGRAPHY;  Surgeon: Wenona Hamilton, MD;  Location: [facility] INVASIVE CV LAB;  Service: Cardiovascular;  Laterality: N/A;    Family History  Problem Relation Age of Onset   Heart attack Mother    Diabetes Mother    Heart disease Mother    Heart failure Father    Breast cancer Sister    Sleep apnea Brother    Diverticulitis Brother    Diverticulitis Sister     Social History   Socioeconomic History   Marital status: Divorced    Spouse name: Not on file   Number of children: Not on file   Years of education: Not on file   Highest education level: 9th grade  Occupational History   Not on file  Tobacco Use   Smoking status: Never   Smokeless tobacco: Never  Vaping Use   Vaping status: Never Used  Substance and Sexual Activity   Alcohol use: No    Alcohol/week: 0.0 standard drinks of alcohol   Drug use: No   Sexual activity: Not on file  Other Topics Concern   Not on file  Social History Narrative   Not on file   Social Drivers of Health   Financial Resource Strain: High Risk (10/19/2023)   Overall Financial Resource Strain (CARDIA)    Difficulty of Paying Living Expenses: Very hard  Food Insecurity: Food Insecurity Present (10/19/2023)   Hunger Vital Sign    Worried About Running Out of Food in the Last Year: Sometimes true    Ran Out of Food in the Last Year: Patient declined  Transportation Needs: No Transportation Needs (10/19/2023)   PRAPARE - Administrator, Civil Service (Medical): No    Lack of Transportation (Non-Medical): No  Physical Activity: Unknown (10/19/2023)   Exercise Vital Sign    Days of Exercise per Week: 0 days    Minutes of Exercise per Session: Not on file  Stress: No Stress Concern Present (10/19/2023)   [employer] of Occupational Health - Occupational Stress Questionnaire    Feeling of Stress : Only a little  Recent Concern: Stress - Stress Concern Present (09/06/2023)   [employer]  of Occupational Health - Occupational Stress Questionnaire    Feeling of Stress : To some extent  Social Connections: Moderately Integrated (10/19/2023)   Social Connection and Isolation Panel    Frequency of Communication with Friends and Family: More than three times a week    Frequency of Social Gatherings with Friends and Family: Twice a week    Attends Religious Services: More than 4 times per year    Active Member of [employer] or Organizations: Yes    Attends Engineer, structural: More than 4 times per year    Marital Status: Divorced  Catering manager Violence: Not on file     Prior to Admission medications   Medication Sig Start Date End Date Taking? Authorizing Provider  albuterol  (VENTOLIN  HFA) 108 (90 Base) MCG/ACT inhaler Inhale 2 puffs into the lungs every 6 (six) hours as needed for wheezing or shortness of breath. 07/22/22   Normie Becton, FNP  amLODipine -benazepril  (LOTREL) 5-20 MG capsule Take 1 capsule by mouth daily. 03/13/24   Trenton Frock, PA-C  atenolol  (TENORMIN )  50 MG tablet Take 1 tablet (50 mg total) by mouth daily. 03/13/24   Trenton Frock, PA-C  Blood Glucose Monitoring Suppl (CONTOUR NEXT USB MONITOR) w/Device KIT 1 kit by Does not apply route daily. To check blood sugar once daily. 10/03/19   Angelia Kelp, PA-C  CareTouch Safety Lancets 26G MISC 1 Device by Does not apply route daily. To check blood sugar once daily. 10/03/19   Angelia Kelp, PA-C  celecoxib  (CELEBREX ) 200 MG capsule Take 200 mg by mouth as needed for moderate pain (pain score 4-6). 09/12/23   [provider]  Continuous Glucose Sensor (FREESTYLE LIBRE 3 PLUS SENSOR) MISC USE AS DIRECTED. CHANGE SENSOR EVERY 15 DAYS. 01/26/24   Trenton Frock, PA-C  dapagliflozin  propanediol (FARXIGA ) 10 MG TABS tablet Take 1 tablet (10 mg total) by mouth daily. For 2025 [place] Patient [place] Program 09/30/23   Trenton Frock, PA-C  ezetimibe  (ZETIA ) 10 MG tablet Take 1 tablet (10  mg total) by mouth daily. 09/08/23   Trenton Frock, PA-C  glucose blood (CONTOUR NEXT TEST) test strip To check blood sugar once daily. 11/19/20   Angelia Kelp, PA-C  insulin  degludec ([name] FLEXTOUCH) 100 UNIT/ML FlexTouch Pen Inject 23 Units into the skin 2 (two) times daily.    [provider]  Insulin  Pen Needle (NOVOFINE PLUS) 32G X 4 MM MISC To use with ozempic  injections 01/31/19   Burnette, Jennifer M, PA-C  montelukast  (SINGULAIR ) 10 MG tablet TAKE 1 TABLET BY MOUTH AT BEDTIME 03/01/24   Drubel, Heidi Llamas, PA-C  omeprazole (PRILOSEC OTC) 20 MG tablet Take 20 mg by mouth daily.    [provider]    Allergies  Allergen Reactions   Amoxicillin-Pot Clavulanate Other (See Comments)    Deathly sick per patient   Levsin  [Hyoscyamine]     Dizziness   Ozempic  (0.25 Or 0.5 Mg-Dose) [Semaglutide (0.25 Or 0.5mg -Dos)] Other (See Comments)    pancreatitis   Shellfish Allergy Diarrhea and Nausea And Vomiting    dizziness   Metformin  And Related Diarrhea    REVIEW OF SYSTEMS:  General: no fevers/chills/night sweats Eyes: no blurry vision, diplopia, or amaurosis ENT: no sore throat or hearing loss Resp: no cough, wheezing, or hemoptysis CV: no edema or palpitations GI: no abdominal pain, nausea, vomiting, diarrhea, or constipation GU: no dysuria, frequency, or hematuria Skin: no rash Neuro: no headache, numbness, tingling, or weakness of extremities Musculoskeletal: no joint pain or swelling Heme: no bleeding, DVT, or easy bruising Endo: no polydipsia or polyuria  BP (!) 180/78   Pulse 76   Ht 5' 2 (1.575 m)   Wt 199 lb (90.3 kg)   SpO2 98%   BMI 36.40 kg/m   PHYSICAL EXAM: GEN:  AO x 3 in no acute distress HEENT: normal Dentition: Normal on exam Neck: JVP normal. +2 carotid upstrokes without bruits. No thyromegaly. Lungs: equal expansion, clear bilaterally CV: Apex is discrete and nondisplaced, RRR with 3/6 systolic murmur absent S2 Abd: soft,  non-tender, non-distended; no bruit; positive bowel sounds Ext: no edema, ecchymoses, or cyanosis Vascular: 2+ femoral pulses, 2+ radial pulses       Skin: warm and dry without rash Neuro: CN II-XII grossly intact; motor and sensory grossly intact    DATA AND STUDIES:  EKG: May 2025 sinus rhythm with first-degree AV block  EKG Interpretation Date/Time:    Ventricular Rate:    PR Interval:    QRS Duration:    QT Interval:    QTC Calculation:  R Axis:      Text Interpretation:          Cardiac Studies & Procedures   ______________________________________________________________________________________________ CARDIAC CATHETERIZATION  CARDIAC CATHETERIZATION 03/26/2024  Conclusion   Prox LAD lesion is 10% stenosed.  1.  Minor irregularities with no evidence of obstructive coronary artery disease. 2.  Severe aortic stenosis by echo.  I did not attempt to cross the aortic valve. 3.  Right heart catheterization showed normal right and left-sided filling pressures, normal pulmonary pressure and normal cardiac output.  Recommendations: Refer to the structural heart clinic for evaluation of bicuspid aortic valve replacement.  Findings Coronary Findings Diagnostic  Dominance: Right  Left Main Vessel is angiographically normal.  Left Anterior Descending Prox LAD lesion is 10% stenosed.  Ramus Intermedius Vessel is angiographically normal.  Left Circumflex The vessel exhibits minimal luminal irregularities.  Right Coronary Artery Vessel is angiographically normal.  Right Posterior Descending Artery Vessel is angiographically normal.  Right Posterior Atrioventricular Artery The vessel exhibits minimal luminal irregularities.  Intervention  No interventions have been documented.     ECHOCARDIOGRAM  ECHOCARDIOGRAM COMPLETE 03/05/2024  Narrative ECHOCARDIOGRAM REPORT    Patient Name:   Shari Lee Date of Exam: 03/05/2024 Medical Rec #:  [id number]       Height:       62.0 in Accession #:    [id number]     Weight:       196.0 lb Date of Birth:  [birth year]      BSA:          1.895 m Patient Age:    68 years       BP:           130/78 mmHg Patient Gender: F              HR:           56 bpm. Exam Location:  [place]  Procedure: 2D Echo, Cardiac Doppler and Color Doppler (Both Spectral and Color Flow Doppler were utilized during procedure).  Indications:    I35.0 Nonrheumatic aortic (valve) stenosis  History:        Patient has prior history of Echocardiogram examinations, most recent 01/27/2022. Aortic Valve Disease, Signs/Symptoms:Shortness of Breath, Dyspnea and Murmur; Risk Factors:Sleep Apnea, Hypertension, Diabetes, Dyslipidemia and Non-Smoker.  Sonographer:    Venson Ginger MHA, BS, RDCS Referring Phys: [id number] SHERI HAMMOCK   Sonographer Comments: Patient is obese and suboptimal apical window. Image acquisition challenging due to patient body habitus. IMPRESSIONS   1. Left ventricular ejection fraction, by estimation, is 60 to 65%. Left ventricular ejection fraction by PLAX is 63 %. The left ventricle has normal function. The left ventricle has no regional wall motion abnormalities. There is mild left ventricular hypertrophy. Left ventricular diastolic parameters are consistent with Grade I diastolic dysfunction (impaired relaxation). 2. Right ventricular systolic function is normal. The right ventricular size is normal. 3. The mitral valve is grossly normal. No evidence of mitral valve regurgitation. 4. The aortic valve is bicuspid. Aortic valve regurgitation is not visualized. Severe aortic valve stenosis. Aortic valve area, by VTI measures 0.58 cm. Aortic valve mean gradient measures 48.0 mmHg. Aortic valve Vmax measures 4.92 m/s. 5. The inferior vena cava is normal in size with greater than 50% respiratory variability, suggesting right atrial pressure of 3 mmHg.  Comparison(s): Severe AS is now present from prior 2023  study.  Conclusion(s)/Recommendation(s): Findings consistent with severe valvular heart disease.  FINDINGS Left Ventricle: Left ventricular ejection fraction, by estimation,  is 60 to 65%. Left ventricular ejection fraction by PLAX is 63 %. The left ventricle has normal function. The left ventricle has no regional wall motion abnormalities. The left ventricular internal cavity size was normal in size. There is mild left ventricular hypertrophy. Left ventricular diastolic parameters are consistent with Grade I diastolic dysfunction (impaired relaxation).  Right Ventricle: The right ventricular size is normal. No increase in right ventricular wall thickness. Right ventricular systolic function is normal.  Left Atrium: Left atrial size was normal in size.  Right Atrium: Right atrial size was normal in size.  Pericardium: There is no evidence of pericardial effusion.  Mitral Valve: The mitral valve is grossly normal. Mild mitral annular calcification. No evidence of mitral valve regurgitation.  Tricuspid Valve: The tricuspid valve is normal in structure. Tricuspid valve regurgitation is mild.  Aortic Valve: The aortic valve is bicuspid. Aortic valve regurgitation is not visualized. Severe aortic stenosis is present. Aortic valve mean gradient measures 48.0 mmHg. Aortic valve peak gradient measures 96.8 mmHg. Aortic valve area, by VTI measures 0.58 cm.  Pulmonic Valve: The pulmonic valve was not well visualized. Pulmonic valve regurgitation is not visualized.  Aorta: The aortic root and ascending aorta are structurally normal, with no evidence of dilitation.  Venous: The inferior vena cava is normal in size with greater than 50% respiratory variability, suggesting right atrial pressure of 3 mmHg.  IAS/Shunts: No atrial level shunt detected by color flow Doppler.   LEFT VENTRICLE PLAX 2D LV EF:         Left            Diastology ventricular     LV e' medial:    8.05 cm/s ejection         LV E/e' medial:  10.1 fraction by     LV e' lateral:   4.46 cm/s PLAX is 63      LV E/e' lateral: 18.2 %. LVIDd:         3.90 cm LVIDs:         2.59 cm LV PW:         1.07 cm LV IVS:        1.00 cm LVOT diam:     2.00 cm LV SV:         74 LV SV Index:   39 LVOT Area:     3.14 cm   RIGHT VENTRICLE RV S prime:     11.20 cm/s TAPSE (M-mode): 2.2 cm  LEFT ATRIUM             Index LA diam:        4.40 cm 2.32 cm/m LA Vol (A2C):   40.9 ml 21.58 ml/m LA Vol (A4C):   49.8 ml 26.27 ml/m LA Biplane Vol: 46.3 ml 24.43 ml/m AORTIC VALVE AV Area (Vmax):    0.54 cm AV Area (Vmean):   0.61 cm AV Area (VTI):     0.58 cm AV Vmax:           492.00 cm/s AV Vmean:          312.000 cm/s AV VTI:            1.280 m AV Peak Grad:      96.8 mmHg AV Mean Grad:      48.0 mmHg LVOT Vmax:         84.60 cm/s LVOT Vmean:        60.900 cm/s LVOT VTI:  0.237 m LVOT/AV VTI ratio: 0.19  AORTA Ao Sinus diam: 2.44 cm Ao Asc diam:   3.00 cm  MITRAL VALVE MV Area (PHT): 2.20 cm     SHUNTS MV Decel Time: 345 msec     Systemic VTI:  0.24 m MV E velocity: 81.30 cm/s   Systemic Diam: 2.00 cm MV A velocity: 120.00 cm/s MV E/A ratio:  0.68  Constancia Delton MD Electronically signed by Constancia Delton MD Signature Date/Time: 03/05/2024/4:38:31 PM    Final          ______________________________________________________________________________________________      11/04/2023: ALT 19; Magnesium 2.2; Pro B Natriuretic peptide (BNP) 80.0 03/13/2024: BUN 14; Creatinine, Ser 0.94; Platelets 358 03/26/2024: Hemoglobin 12.9; Potassium 4.3; Sodium 140   STS RISK CALCULATOR: Pending  NHYA CLASS: 2    ASSESSMENT AND PLAN:   1. Calcific aortic stenosis of bicuspid valve   2. Coronary artery disease involving native coronary artery of native heart without angina pectoris   3. Type 2 diabetes mellitus with complication, with long-term current use of insulin  (HCC)   4. Hypertension  associated with diabetes (HCC)   5. Hyperlipidemia associated with type 2 diabetes mellitus (HCC)   6. CKD stage 2 due to type 2 diabetes mellitus (HCC)   7. BMI 36.0-36.9,adult     Bicuspid aortic stenosis: Will refer for TAVR protocol CTA and cardiothoracic surgical opinion.  If she has ascending aortic aneurysm or troublesome bicuspid anatomy that would be associated with a poor outcome with transcatheter valve replacement then surgery should be considered.  She does have issues with her teeth and will be seeing her dentist next month.   Coronary artery disease: Mild; start aspirin  81 mg, continue Zetia  10 mg; consider statin. T2DM: Start aspirin  81 mg, continue amlodipine /benazepril  5 x 20 mg, continue Farxiga , and consider statin. Hypertension: Continue amlodipine /benazepril  5 x 20 mg daily and atenolol  50 mg daily.  Optimize regimen after aortic valve intervention. Hyperlipidemia: Currently on Zetia ; LDL in April was above goal of 70.  Consider statin and checking LP(a). CKD stage II: Continue amlodipine /benazepril  5 x 20 mg daily and Farxiga  10 mg. Elevated BMI: Patient unable to tolerate Ozempic .   I have personally reviewed the patients imaging data as summarized above.  I have reviewed the natural history of aortic stenosis with the patient and family members who are present today. We have discussed the limitations of medical therapy and the poor prognosis associated with symptomatic aortic stenosis. We have also reviewed potential treatment options, including palliative medical therapy, conventional surgical aortic valve replacement, and transcatheter aortic valve replacement. We discussed treatment options in the context of this patient's specific comorbid medical conditions.   All of the patient's questions were answered today. Will make further recommendations based on the results of studies outlined above.   I spent 50 minutes reviewing all clinical data during and prior to this  visit including all relevant imaging studies, laboratories, clinical information from other health systems and prior notes from both Cardiology and other specialties, interviewing the patient, conducting a complete physical examination, and coordinating care in order to formulate a comprehensive and personalized evaluation and treatment plan.   Noely Kuhnle K Shari Alviar, MD  04/05/2024 12:44 PM    [facility] [address] [place], [place], [place]  [address] Phone: [phone]; Fax: [phone]

## 2024-04-04 ENCOUNTER — Encounter (HOSPITAL_BASED_OUTPATIENT_CLINIC_OR_DEPARTMENT_OTHER): Payer: Self-pay

## 2024-04-05 ENCOUNTER — Other Ambulatory Visit: Payer: Self-pay | Admitting: Physician Assistant

## 2024-04-05 ENCOUNTER — Encounter: Payer: Self-pay | Admitting: Internal Medicine

## 2024-04-05 ENCOUNTER — Ambulatory Visit: Attending: Internal Medicine | Admitting: Internal Medicine

## 2024-04-05 ENCOUNTER — Encounter: Payer: Self-pay | Admitting: Physician Assistant

## 2024-04-05 VITALS — BP 180/78 | HR 76 | Ht 62.0 in | Wt 199.0 lb

## 2024-04-05 DIAGNOSIS — Q2381 Nonrheumatic aortic (valve) stenosis: Secondary | ICD-10-CM

## 2024-04-05 DIAGNOSIS — I152 Hypertension secondary to endocrine disorders: Secondary | ICD-10-CM

## 2024-04-05 DIAGNOSIS — E785 Hyperlipidemia, unspecified: Secondary | ICD-10-CM

## 2024-04-05 DIAGNOSIS — N182 Chronic kidney disease, stage 2 (mild): Secondary | ICD-10-CM

## 2024-04-05 DIAGNOSIS — E1122 Type 2 diabetes mellitus with diabetic chronic kidney disease: Secondary | ICD-10-CM

## 2024-04-05 DIAGNOSIS — E1169 Type 2 diabetes mellitus with other specified complication: Secondary | ICD-10-CM

## 2024-04-05 DIAGNOSIS — I350 Nonrheumatic aortic (valve) stenosis: Secondary | ICD-10-CM | POA: Diagnosis not present

## 2024-04-05 DIAGNOSIS — E1159 Type 2 diabetes mellitus with other circulatory complications: Secondary | ICD-10-CM

## 2024-04-05 DIAGNOSIS — Z794 Long term (current) use of insulin: Secondary | ICD-10-CM

## 2024-04-05 DIAGNOSIS — I2510 Atherosclerotic heart disease of native coronary artery without angina pectoris: Secondary | ICD-10-CM

## 2024-04-05 DIAGNOSIS — E118 Type 2 diabetes mellitus with unspecified complications: Secondary | ICD-10-CM | POA: Diagnosis not present

## 2024-04-05 DIAGNOSIS — Z6836 Body mass index (BMI) 36.0-36.9, adult: Secondary | ICD-10-CM

## 2024-04-05 NOTE — Patient Instructions (Signed)
Medication Instructions:  No changes *If you need a refill on your cardiac medications before your next appointment, please call your pharmacy*   Lab Work: none If you have labs (blood work) drawn today and your tests are completely normal, you will receive your results only by: [place] (if you have MyChart) OR A paper copy in the mail If you have any lab test that is abnormal or we need to change your treatment, we will call you to review the results.   Testing/Procedures: none   Follow-Up: Per Structural Heart Team

## 2024-04-05 NOTE — Progress Notes (Addendum)
 Pre Surgical Assessment: 5 M Walk Test  71M=16.9ft  5 Meter Walk Test- trial 1: 5.45 seconds 5 Meter Walk Test- trial 2: 5.19 seconds 5 Meter Walk Test- trial 3: 5.31 seconds 5 Meter Walk Test Average: 5.32 seconds  ____________________  Procedure Type: Isolated AVR Perioperative Outcome Estimate % Operative Mortality 1.99% Morbidity & Mortality 7.67% Stroke 1.05% Renal Failure 1.34% Reoperation 2.57% Prolonged Ventilation 4.69% Deep Sternal Wound Infection 0.126% Long Hospital Stay (>14 days) 3.6% Short Hospital Stay (<6 days)* 50.6%

## 2024-04-10 ENCOUNTER — Other Ambulatory Visit: Payer: Self-pay

## 2024-04-10 ENCOUNTER — Encounter: Payer: Self-pay | Admitting: Cardiology

## 2024-04-10 ENCOUNTER — Telehealth: Payer: Self-pay

## 2024-04-10 ENCOUNTER — Ambulatory Visit: Attending: Cardiology | Admitting: Cardiology

## 2024-04-10 VITALS — BP 117/74 | HR 68 | Ht 62.0 in | Wt 198.0 lb

## 2024-04-10 DIAGNOSIS — R011 Cardiac murmur, unspecified: Secondary | ICD-10-CM

## 2024-04-10 DIAGNOSIS — E785 Hyperlipidemia, unspecified: Secondary | ICD-10-CM

## 2024-04-10 DIAGNOSIS — Z79899 Other long term (current) drug therapy: Secondary | ICD-10-CM

## 2024-04-10 DIAGNOSIS — I350 Nonrheumatic aortic (valve) stenosis: Secondary | ICD-10-CM

## 2024-04-10 DIAGNOSIS — I152 Hypertension secondary to endocrine disorders: Secondary | ICD-10-CM

## 2024-04-10 DIAGNOSIS — Z8679 Personal history of other diseases of the circulatory system: Secondary | ICD-10-CM

## 2024-04-10 DIAGNOSIS — Z794 Long term (current) use of insulin: Secondary | ICD-10-CM

## 2024-04-10 DIAGNOSIS — I2510 Atherosclerotic heart disease of native coronary artery without angina pectoris: Secondary | ICD-10-CM | POA: Diagnosis not present

## 2024-04-10 DIAGNOSIS — E1169 Type 2 diabetes mellitus with other specified complication: Secondary | ICD-10-CM

## 2024-04-10 DIAGNOSIS — E1159 Type 2 diabetes mellitus with other circulatory complications: Secondary | ICD-10-CM

## 2024-04-10 DIAGNOSIS — E118 Type 2 diabetes mellitus with unspecified complications: Secondary | ICD-10-CM

## 2024-04-10 NOTE — Patient Instructions (Signed)
 Medication Instructions:  Your physician recommends that you continue on your current medications as directed. Please refer to the Current Medication list given to you today.   *If you need a refill on your cardiac medications before your next appointment, please call your pharmacy*  Lab Work: Your provider would like for you to have following labs drawn today BMP.   If you have labs (blood work) drawn today and your tests are completely normal, you will receive your results only by: MyChart Message (if you have MyChart) OR A paper copy in the mail If you have any lab test that is abnormal or we need to change your treatment, we will call you to review the results.  Testing/Procedures: No test ordered today   Follow-Up: At [facility], you and your health needs are our priority.  As part of our continuing mission to provide you with exceptional heart care, our providers are all part of one team.  This team includes your primary Cardiologist (physician) and Advanced Practice Providers or APPs (Physician Assistants and Nurse Practitioners) who all work together to provide you with the care you need, when you need it.  Your next appointment:   3 - 4 month(s)  Provider:   Redell Cave, MD or Tylene Lunch, NP

## 2024-04-10 NOTE — Telephone Encounter (Signed)
 Pt made aware that [place] has arrived. Placed in frig.

## 2024-04-10 NOTE — Progress Notes (Signed)
 Cardiology Office Note   Date:  04/10/2024  ID:  Shari Lee, DOB April 12, 1956, MRN [id number] PCP: Cyndi Shaver, PA-C  [facility] Providers Cardiologist:  Redell Cave, MD     History of Present Illness Shari Lee is a 68 y.o. female with a past medical history of primary hypertension, type 2 diabetes, aortic stenosis, bicuspid aortic valve, heart murmur, who presents today for follow-up.   She was seen in clinic 02/11/2022 by Dr.Agbor-Etang.  She was.  Echocardiogram she had previously been evaluated by her PCP and was noted to have a cardiac murmur.  Echocardiogram was obtained to evaluate any structural abnormalities.  Patient clinically was asymptomatic denying chest pain shortness of breath or palpitations.  Echocardiogram revealing normal EF 60 to 65%, moderate aortic valve stenosis with mean gradient 25 mmHg recommendation was for repeat echo yearly.   She was seen in clinic on/16/25 stating that overall from a cardiac perspective she had been doing well.  She did have a 30 pound weight loss but states that she ended up with pancreatitis from Ozempic .  She was scheduled for an updated echocardiogram for aortic valve stenosis.  There were no further medication changes that were made.  She was last seen in clinic 03/13/2024 with exertional dyspnea, occasional lightheadedness and dizziness when getting out of bed but denies any syncope or near syncope.  She also noted increased fatigue with activity.  Echocardiogram completed on 5/19 revealed an LVEF of 60 to 65%, no RWMA, mild LVH, G1 DD, right ventricular systolic function was normal and right ventricular size was normal, bicuspid aortic valve noted aortic regurgitation was not visualized but severe aortic valve stenosis with aortic valve area by VTI measuring 0.58 cm and aortic valve mean gradient measuring 48 mmHg.  She was scheduled for a left heart catheterization for further evaluation of aortic valve stenosis and  coronary artery disease with a referral to structural team.  Her left heart catheterization showed a proximal LAD lesion that was 10% stenosis, minor irregularities with no evidence of obstructive coronary artery disease, right heart catheterization showed normal right and left-sided filling pressures, normal pulmonary pressure and normal cardiac output.   She was last seen in clinic 04/05/2024 by Dr.Thukkani.  At that time she was referred for TAVR protocol CTA and cardiothoracic surgical opinion.  Started on aspirin  81 mg daily, continued on ezetimibe  10 mg daily, and hide continue recommendations of considering statin therapy.  No other medication changes were made.  She returns to clinic today  ROS: 10 point review of systems has been reviewed and considered negative the exception was been listed in the HPI  Studies Reviewed EKG Interpretation Date/Time:  Tuesday April 10 2024 11:46:28 EDT Ventricular Rate:  68 PR Interval:  196 QRS Duration:  90 QT Interval:  416 QTC Calculation: 442 R Axis:   63  Text Interpretation: Normal sinus rhythm Normal ECG When compared with ECG of 13-Mar-2024 15:30, No significant change since last tracing Confirmed by Gerard Frederick ([id number]) on 04/10/2024 11:48:22 AM    LHC 03/26/2024   Prox LAD lesion is 10% stenosed.   1.  Minor irregularities with no evidence of obstructive coronary artery disease. 2.  Severe aortic stenosis by echo.  I did not attempt to cross the aortic valve. 3.  Right heart catheterization showed normal right and left-sided filling pressures, normal pulmonary pressure and normal cardiac output.   Recommendations: Refer to the structural heart clinic for evaluation of bicuspid aortic valve replacement. 2D echo 03/05/2024  1. Left ventricular ejection fraction, by estimation, is 60 to 65%. Left  ventricular ejection fraction by PLAX is 63 %. The left ventricle has  normal function. The left ventricle has no regional wall motion   abnormalities. There is mild left ventricular  hypertrophy. Left ventricular diastolic parameters are consistent with  Grade I diastolic dysfunction (impaired relaxation).   2. Right ventricular systolic function is normal. The right ventricular  size is normal.   3. The mitral valve is grossly normal. No evidence of mitral valve  regurgitation.   4. The aortic valve is bicuspid. Aortic valve regurgitation is not  visualized. Severe aortic valve stenosis. Aortic valve area, by VTI  measures 0.58 cm. Aortic valve mean gradient measures 48.0 mmHg. Aortic  valve Vmax measures 4.92 m/s.   5. The inferior vena cava is normal in size with greater than 50%  respiratory variability, suggesting right atrial pressure of 3 mmHg.    2D echo 01/27/2022 1. Left ventricular ejection fraction, by estimation, is 60 to 65%. The  left ventricle has normal function. The left ventricle has no regional  wall motion abnormalities. There is moderate left ventricular hypertrophy.  Left ventricular diastolic  parameters are consistent with Grade I diastolic dysfunction (impaired  relaxation). The average left ventricular global longitudinal strain is  -17.5 %. The global longitudinal strain is normal.   2. Right ventricular systolic function is normal. The right ventricular  size is normal.   3. Left atrial size was mildly dilated.   4. The mitral valve is normal in structure. No evidence of mitral valve  regurgitation. No evidence of mitral stenosis.   5. The aortic valve is normal in structure. There is moderate  calcification of the aortic valve. Aortic valve regurgitation is not  visualized. Moderate aortic valve stenosis. Aortic valve area, by VTI  measures 0.82 cm. Aortic valve mean gradient  measures 25.3 mmHg. Aortic valve Vmax measures 3.36 m/s.   6. The inferior vena cava is normal in size with greater than 50%  respiratory variability, suggesting right atrial pressure of 3 mmHg.  Risk  Assessment/Calculations           Physical Exam VS:  BP 117/74   Pulse 68   Ht 5' 2 (1.575 m)   Wt 198 lb (89.8 kg)   SpO2 97%   BMI 36.21 kg/m        Wt Readings from Last 3 Encounters:  04/10/24 198 lb (89.8 kg)  04/05/24 199 lb (90.3 kg)  03/26/24 199 lb 4.8 oz (90.4 kg)    GEN: Well nourished, well developed in no acute distress NECK: No JVD; No carotid bruits, heart murmur radiates into the bilateral carotids CARDIAC: RRR, III/VI murmurs, rubs, gallops RESPIRATORY:  Clear to auscultation without rales, wheezing or rhonchi  ABDOMEN: Soft, non-tender, non-distended EXTREMITIES:  No edema; No deformity   ASSESSMENT AND PLAN Severe bicuspid aortic valve stenosis noted on recent echocardiogram VTI measures 0.58 cm grade with aortic valve mean gradient of 40 mmHg.  Continued shortness of breath, dizziness occasional palpitations.  Left heart catheterization revealed nonobstructive coronary artery disease.  She did follow-up with structural heart team and was in TAVR studies scheduled.  She also has an upcoming appointment scheduled with Dr. Shyrl.  She has been scheduled for an updated BMP today after recently having a left heart catheterization completed.  She has upcoming appointments with her dentist.  Primary hypertension with a blood pressure today 117/74.  She has been continued on amlodipine /benazepril   5/20 mg daily and atenolol  50 mg daily.  Blood pressure is stable.  She has been encouraged to continue to monitor pressures 1 to 2 hours postmedication administration as well.  Sinus bradycardia that has improved today on EKG shows heart rate of 68.  She is chronotropically competent.  Could be secondary to aortic valve stenosis.  Will continue to monitor with surveillance studies and adjust medications as needed.  Mixed hyperlipidemia mixed hyperlipidemia with an LDL of 110 with a goal less than 70 ideally less than 55 with a history of diabetes.  She is trying to work on  dietary changes.  Would benefit from additional statin to get her to goal.  Ongoing management per PCP.  Type 2 diabetes with last A1c of 9.0.  She is continued on insulin , Farxiga  10 mg daily, [place], with ongoing management per PCP.  Nonobstructive coronary artery disease with proximal LAD lesion 10% with minor irregularities with no evidence of obstructive coronary artery disease.  She was started on aspirin  81 mg daily today continued on ezetimibe  10 mg daily and discussed additional statin therapy.       Dispo: Patient to return to clinic to see MD/APP in 3 to 4 months or sooner if needed for further evaluation.  She has also been encouraged to keep all follow-ups with the structural and CVTS.  Signed, Gevin Perea, NP

## 2024-04-11 ENCOUNTER — Ambulatory Visit: Payer: Self-pay | Admitting: Cardiology

## 2024-04-11 LAB — BASIC METABOLIC PANEL WITH GFR
BUN/Creatinine Ratio: 18 (ref 12–28)
BUN: 17 mg/dL (ref 8–27)
CO2: 21 mmol/L (ref 20–29)
Calcium: 9.7 mg/dL (ref 8.7–10.3)
Chloride: 100 mmol/L (ref 96–106)
Creatinine, Ser: 0.96 mg/dL (ref 0.57–1.00)
Glucose: 204 mg/dL — ABNORMAL HIGH (ref 70–99)
Potassium: 4.8 mmol/L (ref 3.5–5.2)
Sodium: 138 mmol/L (ref 134–144)
eGFR: 65 mL/min/{1.73_m2} (ref >59)

## 2024-04-11 NOTE — Progress Notes (Signed)
 Kidney function and potassium have remained stable.  Glucose was slightly elevated but these were nonfasting lab ups.  No current changes to medication regimen needed at this time.

## 2024-04-12 ENCOUNTER — Other Ambulatory Visit: Payer: Self-pay | Admitting: Pharmacist

## 2024-04-12 MED ORDER — EZETIMIBE 10 MG PO TABS: 10.0000 mg | ORAL_TABLET | Freq: Every day | ORAL | 0 refills | Status: DC

## 2024-04-12 NOTE — Progress Notes (Signed)
 04/12/2024 Name: Shari Lee MRN: [id number] DOB: 1956-02-20  Chief Complaint  Patient presents with   Diabetes    Shari Lee is a 68 y.o. year old female who presented for a telephone visit.   They were referred to the pharmacist by their PCP for assistance in managing diabetes and medication access.    Subjective:   Medication Access/Adherence  Current Pharmacy:  [facility] [address] ([address]), [place] - [address] [place]) [place] [address] Phone: [phone] Fax: [phone]  [facility] - [place], [place] - [address] [place] [address] Phone: [phone] Fax: [phone]   Patient reports affordability concerns with their medications: Yes  Patient reports access/transportation concerns to their pharmacy: No  Patient reports adherence concerns with their medications:  Yes      Diabetes:  Current medications:  [name] 25 units twice a day (dose increased from 23 units twice a day 03/29/2024)  - approved to get thru [employer] thru 10/17/2024 Plan to start Novolog  / insulin  aspart with largest meal but we are waiting on shipment form [employer] medication assistance program.  Farxiga  10mg  daily - [place] - [place] thru 10/17/2024   She was previously was following a low CHO but high meat / protein diet. Blood glucose was really well controlled by she has not been able to sustain this type of eating plan. She reports she tired of eating so much meat and is just too busy to manage her diet.     Medications tried in the past: Ozempic  - stopped due to pancreatitis; metformin  - worsened IBS related diarrhea   Date of Download: 04/12/2024 % Time CGM is active: 97%  Average Glucose: 234 mg/dL (previously 733 mg/dL) Glucose Management Indicator: 8.9% (preciously was 9.7%) Glucose Variability: 35% (goal <36%) (previously was 28.7%) Time in Goal:   - Time in range 70-180: 31% (previously 14%) - Time above range: 69% ( previously 86%) - Time below range: 0% ( previously 0%)  Blood glucose trends - Average blood glucose is improving but still having postprandial highs.  Patient has recently been trying to decrease bread and potato intake. She report last night for dinner she ate grilled chicken nuggets instead of having a checking sandwich at [employer]. Blood glucose did not spike as much as usual.       Wt Readings from Last 3 Encounters:  04/10/24 198 lb (89.8 kg)  04/05/24 199 lb (90.3 kg)  03/26/24 199 lb 4.8 oz (90.4 kg)   Hyperlipidemia/ASCVD Risk Reduction  Current lipid lowering medications: ezetimibe  10mg  daily Medications tried in the past: rosuvastatin  - patient stop because she felt It was affecting her memory.   Objective:  Lab Results  Component Value Date   HGBA1C 9.0 (H) 01/24/2024   BP Readings from Last 3 Encounters:  04/10/24 117/74  04/05/24 (!) 180/78  03/26/24 (!) 149/66     Lab Results  Component Value Date   CREATININE 0.96 04/10/2024   BUN 17 04/10/2024   NA 138 04/10/2024   K 4.8 04/10/2024   CL 100 04/10/2024   CO2 21 04/10/2024    Lab Results  Component Value Date   CHOL 189 01/24/2024   HDL 55.40 01/24/2024   LDLCALC 110 (H) 01/24/2024   TRIG 119.0 01/24/2024   CHOLHDL 3 01/24/2024    Medications Reviewed Today   Medications were not reviewed in  this encounter       Assessment/Plan:  Diabetes: blood glucose and GMI over the last 2 weeks has not been at goal. Blood glucose is very dependent on her diet choices - Reviewed goal A1c, goal fasting, and goal 2 hour post prandial glucose - Reviewed dietary modifications - recommended she try increasing lower carbohydrate foods to her meals - green beans, peppers, onions, broccoli, cauliflower, zuchini, squash, lettuce, greens.  - Continue [name] to 25 units twice a day - Called to check on Novolog  / insulin  aspart fill  and estimated delivery from [employer] medication assistance program. First fill is in process and should be received in 7 to 10 business days. Plan is to add meal time insulin  with largest meal of the day to start.   - Continue Farxiga  10mg  daily.  - Continue to use Libre 3 sensors to check blood glucose continuously.   Hyperlipidemia/ASCVD Risk Reduction:Currently uncontrolled.  - Reviewed long term complications of uncontrolled cholesterol - continue ezetimibe  10mg  daily  - updated Rx at her pharmacy for 90 day supply  Follow Up Plan: 2 weeks.   Shari Lee, PharmD Clinical Pharmacist [facility]  Population Health [phone]

## 2024-04-26 ENCOUNTER — Other Ambulatory Visit: Payer: Self-pay | Admitting: Pharmacist

## 2024-04-26 DIAGNOSIS — E1165 Type 2 diabetes mellitus with hyperglycemia: Secondary | ICD-10-CM

## 2024-04-26 NOTE — [place] Notes (Signed)
 04/26/2024 Name: Shari Lee MRN: [id number] DOB: 11/06/1955  Chief Complaint  Patient presents with   Diabetes   Medication Management    Shari Lee is a 68 y.o. year old female who presented for a telephone visit.   They were referred to the pharmacist by their PCP for assistance in managing diabetes and medication access.    Subjective:   Medication Access/Adherence  Current Pharmacy:  [facility] [address] ([address]), [place] - [address] [place]) [place] [address] Phone: [phone] Fax: [phone]  [facility] - [place], [place] - [address] [place] [address] Phone: [phone] Fax: [phone]   Patient reports affordability concerns with their medications: Yes  Patient reports access/transportation concerns to their pharmacy: No  Patient reports adherence concerns with their medications:  Yes      Diabetes:  Current medications:  [name] 25 units twice a day (dose increased from 23 units twice a day 03/29/2024)  - approved to get thru [employer] thru 10/17/2024 Plan to start Novolog  / insulin  aspart with largest meal but we are waiting on shipment form [employer] medication assistance program.  Farxiga  10mg  daily - [place] - approved thru 10/17/2024   She was previously was following a low CHO but high meat / protein diet. Blood glucose was really well controlled by she has not been able to sustain this type of eating plan. She reports she tired of eating so much meat and is just too busy to manage her diet.     Medications tried in the past: Ozempic  - stopped due to pancreatitis; metformin  - worsened IBS related diarrhea   Date of Download: 04/26/2024 % Time CGM is active: 57%  Average Glucose: 212 mg/dL (previously 765 mg/dL) Glucose Management Indicator: 8.4% (preciously was 8.9%) Glucose Variability: 41% (goal <36%) (previously  was 35%) Time in Goal:  - Time in range 70-180: 38% (previously 31%) - Time above range: 59% ( previously 69%) - Time below range: 3% ( previously 0%)  Blood glucose trends - Average blood glucose is improving but still having postprandial highs. Also noted to have 2 hypoglycemic events in early am - one lasted about 3 or 4 hours.  The last 2 days blood glucose has been very high - patient states she was out of town and forgot to take her [name] and she just restarted [name] yesterday.        Wt Readings from Last 3 Encounters:  04/10/24 198 lb (89.8 kg)  04/05/24 199 lb (90.3 kg)  03/26/24 199 lb 4.8 oz (90.4 kg)   Hyperlipidemia/ASCVD Risk Reduction  Current lipid lowering medications: ezetimibe  10mg  daily Medications tried in the past: rosuvastatin  - patient stop because she felt It was affecting her memory.   Objective:  Lab Results  Component Value Date   HGBA1C 9.0 (H) 01/24/2024   BP Readings from Last 3 Encounters:  04/10/24 117/74  04/05/24 (!) 180/78  03/26/24 (!) 149/66     Lab Results  Component Value Date   CREATININE 0.96 04/10/2024   BUN 17 04/10/2024   NA 138 04/10/2024   K 4.8 04/10/2024   CL 100 04/10/2024   CO2 21 04/10/2024    Lab Results  Component Value Date   CHOL 189 01/24/2024   HDL 55.40 01/24/2024   LDLCALC 110 (H) 01/24/2024   TRIG 119.0 01/24/2024   CHOLHDL 3 01/24/2024  Medications Reviewed Today     Reviewed by Carla Milling, RPH-CPP (Pharmacist) on 04/26/24 at 1150  Med List Status: <None>   Medication Order Taking? Sig Documenting Provider Last Dose Status Informant  albuterol  (VENTOLIN  HFA) 108 (90 Base) MCG/ACT inhaler [id number] Yes Inhale 2 puffs into the lungs every 6 (six) hours as needed for wheezing or shortness of breath. Emilio Kelly DASEN, FNP  Active Self  amLODipine -benazepril  (LOTREL) 5-20 MG capsule [id number] Yes Take 1 capsule by mouth daily. Cyndi Shaver, PA-C  Active Self  aspirin  81 MG chewable  tablet [id number] Yes Chew 1 tablet by mouth daily. [provider]  Active   atenolol  (TENORMIN ) 50 MG tablet [id number] Yes Take 1 tablet (50 mg total) by mouth daily. Cyndi Shaver, PA-C  Active Self  Blood Glucose Monitoring Suppl (CONTOUR NEXT USB MONITOR) w/Device KIT [id number]  1 kit by Does not apply route daily. To check blood sugar once daily. Vivienne Delon HERO, PA-C  Active Self  CareTouch Safety Lancets 26G MISC [id number]  1 Device by Does not apply route daily. To check blood sugar once daily. Vivienne Delon HERO, PA-C  Active Self  Continuous Glucose Sensor (FREESTYLE LIBRE 3 PLUS SENSOR) [place] [id number] Yes USE AS DIRECTED. CHANGE SENSOR EVERY 15 DAYS. Cyndi Shaver, PA-C  Active Self  dapagliflozin  propanediol (FARXIGA ) 10 MG TABS tablet [id number] Yes Take 1 tablet (10 mg total) by mouth daily. For 2025 [place], [place], [place]  Active Self           Med Note (BRIDGES, JACQUELINE L   Wed Feb 01, 2024  2:54 PM)    ezetimibe  (ZETIA ) 10 MG tablet [id number] Yes Take 1 tablet (10 mg total) by mouth daily. Cyndi Shaver, PA-C  Active   glucose blood (CONTOUR NEXT TEST) test strip [id number]  To check blood sugar once daily. Vivienne Delon HERO, PA-C  Active Self  insulin  degludec ([name] FLEXTOUCH) 100 UNIT/ML FlexTouch Pen [id number] Yes Inject 23 Units into the skin 2 (two) times daily. [provider]  Active Self           Med Note ELSWORTH, JACQUELINE L   Wed Feb 01, 2024  2:55 PM)    Insulin  Pen Needle (NOVOFINE PLUS) 32G X 4 MM MISC [id number] Yes To use with ozempic  injections Burnette, Jennifer M, PA-C  Active Self  montelukast  (SINGULAIR ) 10 MG tablet [id number] Yes TAKE 1 TABLET BY MOUTH AT BEDTIME Cyndi Shaver, PA-C  Active Self  omeprazole (PRILOSEC OTC) 20 MG tablet [id number] Yes Take 20 mg by mouth daily. [provider]  Active Self              Assessment/Plan:  Diabetes: blood glucose and GMI over  the last 2 weeks has not been at goal. Blood glucose is very dependent on her diet choices and compliance with therapy - Reviewed goal A1c, goal fasting, and goal 2 hour post prandial glucose - Reviewed dietary modifications - recommended she try increasing lower carbohydrate foods to her meals - green beans, peppers, onions, broccoli, cauliflower, zuchini, squash, lettuce, greens.  - Continue [name] to 25 units twice a day - patient is reminded importance of taking EVERY day - Called to check on Novolog  / insulin  aspart fill and estimated delivery from [employer] medication assistance program. First fill completed 04/16/2024 - anticipate delivery between 04/27/2024 and 05/04/2024 per [facility].  Plan is to add meal time insulin  with largest meal of the day to start. - insulin  aspart  4 units with largest meal and lower [name] to 25 units each morning and 15 units each evening. - Continue Farxiga  10mg  daily.  - Continue to use Libre 3 sensors to check blood glucose continuously.   Hyperlipidemia/ASCVD Risk Reduction:Currently uncontrolled.  - Reviewed long term complications of uncontrolled cholesterol - continue ezetimibe  10mg  daily  Follow Up Plan: 2 weeks.   [name], PharmD Clinical Pharmacist [facility]  Population Health [phone]

## 2024-04-27 ENCOUNTER — Ambulatory Visit (HOSPITAL_COMMUNITY)
Admission: RE | Admit: 2024-04-27 | Discharge: 2024-04-27 | Disposition: A | Discharge status: Home / Self Care | Source: Ambulatory Visit | Attending: Physician Assistant

## 2024-04-27 DIAGNOSIS — R918 Other nonspecific abnormal finding of lung field: Secondary | ICD-10-CM | POA: Diagnosis not present

## 2024-04-27 DIAGNOSIS — Q2381 Bicuspid aortic valve: Secondary | ICD-10-CM | POA: Diagnosis not present

## 2024-04-27 DIAGNOSIS — I350 Nonrheumatic aortic (valve) stenosis: Secondary | ICD-10-CM | POA: Diagnosis present

## 2024-04-27 MED ORDER — IOHEXOL 350 MG/ML SOLN
100.0000 mL | Freq: Once | INTRAVENOUS | Status: AC | PRN
  Administered 2024-04-27: 100 mL via INTRAVENOUS

## 2024-05-07 ENCOUNTER — Telehealth: Payer: Self-pay

## 2024-05-07 DIAGNOSIS — E1165 Type 2 diabetes mellitus with hyperglycemia: Secondary | ICD-10-CM

## 2024-05-07 MED ORDER — DAPAGLIFLOZIN PROPANEDIOL 10 MG PO TABS: 10.0000 mg | ORAL_TABLET | Freq: Every day | ORAL | 3 refills | Status: AC

## 2024-05-07 NOTE — Addendum Note (Signed)
 Addended by: CARLA MILLING B on: 05/07/2024 03:56 PM   Modules accepted: Orders

## 2024-05-07 NOTE — Telephone Encounter (Signed)
 Refill sent for Farxiga  to [facility] / [place].  Farxiga  10mg  daily #90 with 3 refills.

## 2024-05-07 NOTE — Telephone Encounter (Signed)
 Pt was enter by mistake.

## 2024-05-08 ENCOUNTER — Other Ambulatory Visit (INDEPENDENT_AMBULATORY_CARE_PROVIDER_SITE_OTHER): Payer: Self-pay | Admitting: Pharmacist

## 2024-05-08 DIAGNOSIS — Z794 Long term (current) use of insulin: Secondary | ICD-10-CM

## 2024-05-08 DIAGNOSIS — E119 Type 2 diabetes mellitus without complications: Secondary | ICD-10-CM

## 2024-05-08 NOTE — Progress Notes (Signed)
 05/08/2024 Name: Shari Lee MRN: [id number] DOB: Jan 01, 1956  No chief complaint on file.   Shari Lee is a 68 y.o. year old female who presented for a telephone visit.   They were referred to the pharmacist by their PCP for assistance in managing diabetes and medication access.    Subjective:   Medication Access/Adherence  Current Pharmacy:  [facility] [address]), [place] - [address] [place] [address] Phone: [phone] Fax: [phone]  [facility] - [place], [place] - [address] [place] [address] Phone: [phone] Fax: [phone]   Patient reports affordability concerns with their medications: Yes  Patient reports access/transportation concerns to their pharmacy: No  Patient reports adherence concerns with their medications:  Yes      Diabetes:  Current medications:  [name] 25 units twice a day (dose increased from 23 units twice a day 03/29/2024)  - approved to get thru [employer] thru 10/17/2024 Plan to start Novolog  / insulin  aspart with largest meal but we are waiting on shipment form [employer] medication assistance program.  Farxiga  10mg  daily - getting from [facility] patient assistance program - approved thru 10/17/2024. Sent updated prescription to program yesterday 7/21   She was previously following a very low CHO but high meat / protein diet. Blood glucose was really well controlled but she found this type of diet ws hard to sustain. She reports she tired of eating so much meat. She had diffculty managing her diet.     Medications tried in the past: Ozempic  - stopped due to pancreatitis; metformin  - worsened IBS related diarrhea   Date of Download: 05/08/2024 % Time CGM is active: 97%  Average Glucose: 255 mg/dL (previously 787 mg/dL) Glucose Management Indicator: 9.4% (preciously was 8.4%) Glucose Variability: 29.9% (goal <36%) (previously was  41%) Time in Goal:  - Time in range 70-180: 19% (previously 38%) - Time above range: 81% ( previously 59%) - Time below range: 0% ( previously 3%)  Blood glucose trends - Average blood glucose is trending back up. No hypoglycemia noted. Noted to have increase in blood glucose postprandially.       Wt Readings from Last 3 Encounters:  04/10/24 198 lb (89.8 kg)  04/05/24 199 lb (90.3 kg)  03/26/24 199 lb 4.8 oz (90.4 kg)   Hyperlipidemia/ASCVD Risk Reduction  Current lipid lowering medications: ezetimibe  10mg  daily Medications tried in the past: rosuvastatin  - patient stop because she felt It was affecting her memory.   Objective:  Lab Results  Component Value Date   HGBA1C 9.0 (H) 01/24/2024   BP Readings from Last 3 Encounters:  04/10/24 117/74  04/05/24 (!) 180/78  03/26/24 (!) 149/66     Lab Results  Component Value Date   CREATININE 0.96 04/10/2024   BUN 17 04/10/2024   NA 138 04/10/2024   K 4.8 04/10/2024   CL 100 04/10/2024   CO2 21 04/10/2024    Lab Results  Component Value Date   CHOL 189 01/24/2024   HDL 55.40 01/24/2024   LDLCALC 110 (H) 01/24/2024   TRIG 119.0 01/24/2024   CHOLHDL 3 01/24/2024    Medications Reviewed Today     Reviewed by Carla Milling, RPH-CPP (Pharmacist) on 05/08/24 at 1137  Med List Status: <None>   Medication Order Taking? Sig Documenting Provider Last Dose Status Informant  albuterol  (VENTOLIN  HFA) 108 (90 Base) MCG/ACT inhaler [id number] Yes Inhale 2  puffs into the lungs every 6 (six) hours as needed for wheezing or shortness of breath. Emilio Kelly DASEN, FNP  Active Self  amLODipine -benazepril  (LOTREL) 5-20 MG capsule [id number] Yes Take 1 capsule by mouth daily. Cyndi Shaver, PA-C  Active Self  aspirin  81 MG chewable tablet [id number] Yes Chew 1 tablet by mouth daily. [provider]  Active   atenolol  (TENORMIN ) 50 MG tablet [id number] Yes Take 1 tablet (50 mg total) by mouth daily. Cyndi Shaver, PA-C  Active  Self  Blood Glucose Monitoring Suppl (CONTOUR NEXT USB MONITOR) w/Device KIT [id number] Yes 1 kit by Does not apply route daily. To check blood sugar once daily. Vivienne Delon HERO, PA-C  Active Self  CareTouch Safety Lancets 26G MISC [id number] Yes 1 Device by Does not apply route daily. To check blood sugar once daily. Vivienne Delon HERO, PA-C  Active Self  Continuous Glucose Sensor (FREESTYLE LIBRE 3 PLUS SENSOR) [place] [id number] Yes USE AS DIRECTED. CHANGE SENSOR EVERY 15 DAYS. Cyndi Shaver, PA-C  Active Self  dapagliflozin  propanediol (FARXIGA ) 10 MG TABS tablet [id number] Yes Take 1 tablet (10 mg total) by mouth daily. For 2025 [place] Patient Assistance Program Cyndi Shaver, PA-C  Active   ezetimibe  (ZETIA ) 10 MG tablet [id number] Yes Take 1 tablet (10 mg total) by mouth daily. Cyndi Shaver, PA-C  Active   glucose blood (CONTOUR NEXT TEST) test strip [id number] Yes To check blood sugar once daily. Vivienne Delon HERO, PA-C  Active Self  insulin  aspart (NOVOLOG ) 100 UNIT/ML injection [id number]  Inject 4-10 Units into the skin daily. Prior to largest meal of the day (received from [employer] medication assistance program thru 10/17/2024)  Patient not taking: Reported on 05/08/2024   Carla Milling, RPH-CPP  Active   insulin  degludec ([name] FLEXTOUCH) 100 UNIT/ML FlexTouch Pen [id number] Yes Inject 23 Units into the skin 2 (two) times daily. [provider]  Active Self           Med Note ELSWORTH, JACQUELINE L   Wed Feb 01, 2024  2:55 PM)    Insulin  Pen Needle (NOVOFINE PLUS) 32G X 4 MM MISC [id number] Yes To use with ozempic  injections Burnette, Jennifer M, PA-C  Active Self  montelukast  (SINGULAIR ) 10 MG tablet [id number] Yes TAKE 1 TABLET BY MOUTH AT BEDTIME Cyndi Shaver, PA-C  Active Self  omeprazole (PRILOSEC OTC) 20 MG tablet [id number] Yes Take 20 mg by mouth daily. [provider]  Active Self              Assessment/Plan:  Diabetes: blood glucose and GMI over  the last 2 weeks has not been at goal. Blood glucose is very dependent on her diet choices and compliance with therapy - Increased [name] to 27 units twice a day - patient is reminded importance of taking EVERY day - Called to check on Novolog  / insulin  aspart fill and estimated delivery from [employer] medication assistance program. First fill began processing 04/16/2024 but has not been shipped yet. Representative I spoke to states she has put in request for expidited shipping. She anticipated delivery date of 05/11/2024 to 05/14/2024.  Again - plan is to add meal time insulin  with largest meal of the day to start. - insulin  aspart 4 units with largest meal and lower [name] to 25 units each morning and 15 units each evening. - Continue Farxiga  10mg  daily.  - Continue to use Libre 3 sensors to check blood glucose continuously.   Hyperlipidemia/ASCVD Risk Reduction:Currently uncontrolled.  - Reviewed long term  complications of uncontrolled cholesterol - continue ezetimibe  10mg  daily  Follow Up Plan: 5 to 10 days  [name], PharmD Clinical Pharmacist [facility]  Population Health [phone]

## 2024-05-16 NOTE — Progress Notes (Unsigned)
 [address]       [place] [address]             [phone]        Danelia Snodgrass [facility] Medical Record #[id number] Date of Birth: 1956-06-28  Referring: Darliss Rogue, MD Primary Care: Cyndi Shaver, PA-C Primary Cardiologist:Brian Darliss, MD  Chief Complaint:   No chief complaint on file.   History of Present Illness:     Shari Lee is a 68 y.o. female presents for surgical evaluation of ***      Past Medical History:  Diagnosis Date   Asthma    mild   Diarrhea    N/V recently   Dyspnea    GERD (gastroesophageal reflux disease)    Headache    Heart murmur    developed in last couple years/ no issues   HOH (hard of hearing)    wears aids   Hypertension    Motion sickness    car   Type 2 diabetes mellitus with hyperglycemia (HCC)     Past Surgical History:  Procedure Laterality Date   ABDOMINAL HYSTERECTOMY  2006   BREAST BIOPSY Left 2005   benign   BREAST EXCISIONAL BIOPSY Left 2005?    -benign   CATARACT EXTRACTION W/PHACO Right 11/30/2022   Procedure: CATARACT EXTRACTION PHACO AND INTRAOCULAR LENS PLACEMENT (IOC) RIGHT DIABETIC;  Surgeon: Jaye Fallow, MD;  Location: [facility];  Service: Ophthalmology;  Laterality: Right;  4.96 0:37.3   CATARACT EXTRACTION W/PHACO Left 12/14/2022   Procedure: CATARACT EXTRACTION PHACO AND INTRAOCULAR LENS PLACEMENT (IOC) LEFT DIABETIC;  Surgeon: Jaye Fallow, MD;  Location: [facility];  Service: Ophthalmology;  Laterality: Left;  5.98 0:40.6   COLONOSCOPY WITH PROPOFOL  N/A 08/13/2016   Procedure: COLONOSCOPY WITH PROPOFOL ;  Surgeon: Rogelia Copping, MD;  Location: [facility];  Service: Endoscopy;  Laterality: N/A;   COLONOSCOPY WITH PROPOFOL  N/A 02/09/2022   Procedure: COLONOSCOPY WITH PROPOFOL ;  Surgeon: Copping Rogelia, MD;  Location: [facility] ENDOSCOPY;  Service: Endoscopy;  Laterality: N/A;   POLYPECTOMY  08/13/2016   Procedure: POLYPECTOMY;   Surgeon: Rogelia Copping, MD;  Location: [facility];  Service: Endoscopy;;   RIGHT HEART CATH AND CORONARY ANGIOGRAPHY N/A 03/26/2024   Procedure: RIGHT HEART CATH AND CORONARY ANGIOGRAPHY;  Surgeon: Darron Deatrice LABOR, MD;  Location: [facility] INVASIVE CV LAB;  Service: Cardiovascular;  Laterality: N/A;    Social History:  Social History   Tobacco Use  Smoking Status Never  Smokeless Tobacco Never    Social History   Substance and Sexual Activity  Alcohol Use No   Alcohol/week: 0.0 standard drinks of alcohol     Allergies  Allergen Reactions   Amoxicillin-Pot Clavulanate Other (See Comments)    Deathly sick per patient   Levsin  [Hyoscyamine]     Dizziness   Ozempic  (0.25 Or 0.5 Mg-Dose) [Semaglutide (0.25 Or 0.5mg -Dos)] Other (See Comments)    pancreatitis   Shellfish Allergy Diarrhea and Nausea And Vomiting    dizziness   Metformin  And Related Diarrhea      Current Outpatient Medications  Medication Sig Dispense Refill   albuterol  (VENTOLIN  HFA) 108 (90 Base) MCG/ACT inhaler Inhale 2 puffs into the lungs every 6 (six) hours as needed for wheezing or shortness of breath. 8 g 2   amLODipine -benazepril  (LOTREL) 5-20 MG capsule Take 1 capsule by mouth daily. 90 capsule 0   aspirin  81 MG chewable tablet Chew 1 tablet by mouth daily.  atenolol  (TENORMIN ) 50 MG tablet Take 1 tablet (50 mg total) by mouth daily. 90 tablet 0   Blood Glucose Monitoring Suppl (CONTOUR NEXT USB MONITOR) w/Device KIT 1 kit by Does not apply route daily. To check blood sugar once daily. 1 kit 0   CareTouch Safety Lancets 26G MISC 1 Device by Does not apply route daily. To check blood sugar once daily. 100 each 12   Continuous Glucose Sensor (FREESTYLE LIBRE 3 PLUS SENSOR) MISC USE AS DIRECTED. CHANGE SENSOR EVERY 15 DAYS. 6 each 1   dapagliflozin  propanediol (FARXIGA ) 10 MG TABS tablet Take 1 tablet (10 mg total) by mouth daily. For 2025 [place] Patient Assistance Program 90 tablet 3   ezetimibe   (ZETIA ) 10 MG tablet Take 1 tablet (10 mg total) by mouth daily. 90 tablet 0   glucose blood (CONTOUR NEXT TEST) test strip To check blood sugar once daily. 100 each 5   insulin  aspart (NOVOLOG ) 100 UNIT/ML injection Inject 4-10 Units into the skin daily. Prior to largest meal of the day (received from [employer] medication assistance program thru 10/17/2024) (Patient not taking: Reported on 05/08/2024)     insulin  degludec ([name] FLEXTOUCH) 100 UNIT/ML FlexTouch Pen Inject 23 Units into the skin 2 (two) times daily.     Insulin  Pen Needle (NOVOFINE PLUS) 32G X 4 MM MISC To use with ozempic  injections 100 each 3   montelukast  (SINGULAIR ) 10 MG tablet TAKE 1 TABLET BY MOUTH AT BEDTIME 90 tablet 0   omeprazole (PRILOSEC OTC) 20 MG tablet Take 20 mg by mouth daily.     No current facility-administered medications for this visit.    (Not in a hospital admission)   Family History  Problem Relation Age of Onset   Heart attack Mother    Diabetes Mother    Heart disease Mother    Heart failure Father    Breast cancer Sister    Sleep apnea Brother    Diverticulitis Brother    Diverticulitis Sister      Review of Systems:   ROS    Physical Exam: There were no vitals taken for this visit. Physical Exam    Cardiac Studies & Procedures   ______________________________________________________________________________________________ CARDIAC CATHETERIZATION  CARDIAC CATHETERIZATION 03/26/2024  Conclusion   Prox LAD lesion is 10% stenosed.  1.  Minor irregularities with no evidence of obstructive coronary artery disease. 2.  Severe aortic stenosis by echo.  I did not attempt to cross the aortic valve. 3.  Right heart catheterization showed normal right and left-sided filling pressures, normal pulmonary pressure and normal cardiac output.  Recommendations: Refer to the structural heart clinic for evaluation of bicuspid aortic valve replacement.  Findings Coronary Findings Diagnostic   Dominance: Right  Left Main Vessel is angiographically normal.  Left Anterior Descending Prox LAD lesion is 10% stenosed.  Ramus Intermedius Vessel is angiographically normal.  Left Circumflex The vessel exhibits minimal luminal irregularities.  Right Coronary Artery Vessel is angiographically normal.  Right Posterior Descending Artery Vessel is angiographically normal.  Right Posterior Atrioventricular Artery The vessel exhibits minimal luminal irregularities.  Intervention  No interventions have been documented.     ECHOCARDIOGRAM  ECHOCARDIOGRAM COMPLETE 03/05/2024  Narrative ECHOCARDIOGRAM REPORT    Patient Name:   ADONNA HORSLEY Date of Exam: 03/05/2024 Medical Rec #:  [id number]      Height:       62.0 in Accession #:    [id number]     Weight:       196.0 lb Date  of Birth:  07/04/1956      BSA:          1.895 m Patient Age:    68 years       BP:           130/78 mmHg Patient Gender: F              HR:           56 bpm. Exam Location:  [place]  Procedure: 2D Echo, Cardiac Doppler and Color Doppler (Both Spectral and Color Flow Doppler were utilized during procedure).  Indications:    I35.0 Nonrheumatic aortic (valve) stenosis  History:        Patient has prior history of Echocardiogram examinations, most recent 01/27/2022. Aortic Valve Disease, Signs/Symptoms:Shortness of Breath, Dyspnea and Murmur; Risk Factors:Sleep Apnea, Hypertension, Diabetes, Dyslipidemia and Non-Smoker.  Sonographer:    Doyal Point MHA, BS, RDCS Referring Phys: [id number] SHERI HAMMOCK   Sonographer Comments: Patient is obese and suboptimal apical window. Image acquisition challenging due to patient body habitus. IMPRESSIONS   1. Left ventricular ejection fraction, by estimation, is 60 to 65%. Left ventricular ejection fraction by PLAX is 63 %. The left ventricle has normal function. The left ventricle has no regional wall motion abnormalities. There is mild left  ventricular hypertrophy. Left ventricular diastolic parameters are consistent with Grade I diastolic dysfunction (impaired relaxation). 2. Right ventricular systolic function is normal. The right ventricular size is normal. 3. The mitral valve is grossly normal. No evidence of mitral valve regurgitation. 4. The aortic valve is bicuspid. Aortic valve regurgitation is not visualized. Severe aortic valve stenosis. Aortic valve area, by VTI measures 0.58 cm. Aortic valve mean gradient measures 48.0 mmHg. Aortic valve Vmax measures 4.92 m/s. 5. The inferior vena cava is normal in size with greater than 50% respiratory variability, suggesting right atrial pressure of 3 mmHg.  Comparison(s): Severe AS is now present from prior 2023 study.  Conclusion(s)/Recommendation(s): Findings consistent with severe valvular heart disease.  FINDINGS Left Ventricle: Left ventricular ejection fraction, by estimation, is 60 to 65%. Left ventricular ejection fraction by PLAX is 63 %. The left ventricle has normal function. The left ventricle has no regional wall motion abnormalities. The left ventricular internal cavity size was normal in size. There is mild left ventricular hypertrophy. Left ventricular diastolic parameters are consistent with Grade I diastolic dysfunction (impaired relaxation).  Right Ventricle: The right ventricular size is normal. No increase in right ventricular wall thickness. Right ventricular systolic function is normal.  Left Atrium: Left atrial size was normal in size.  Right Atrium: Right atrial size was normal in size.  Pericardium: There is no evidence of pericardial effusion.  Mitral Valve: The mitral valve is grossly normal. Mild mitral annular calcification. No evidence of mitral valve regurgitation.  Tricuspid Valve: The tricuspid valve is normal in structure. Tricuspid valve regurgitation is mild.  Aortic Valve: The aortic valve is bicuspid. Aortic valve regurgitation is not  visualized. Severe aortic stenosis is present. Aortic valve mean gradient measures 48.0 mmHg. Aortic valve peak gradient measures 96.8 mmHg. Aortic valve area, by VTI measures 0.58 cm.  Pulmonic Valve: The pulmonic valve was not well visualized. Pulmonic valve regurgitation is not visualized.  Aorta: The aortic root and ascending aorta are structurally normal, with no evidence of dilitation.  Venous: The inferior vena cava is normal in size with greater than 50% respiratory variability, suggesting right atrial pressure of 3 mmHg.  IAS/Shunts: No atrial level shunt detected by color  flow Doppler.   LEFT VENTRICLE PLAX 2D LV EF:         Left            Diastology ventricular     LV e' medial:    8.05 cm/s ejection        LV E/e' medial:  10.1 fraction by     LV e' lateral:   4.46 cm/s PLAX is 63      LV E/e' lateral: 18.2 %. LVIDd:         3.90 cm LVIDs:         2.59 cm LV PW:         1.07 cm LV IVS:        1.00 cm LVOT diam:     2.00 cm LV SV:         74 LV SV Index:   39 LVOT Area:     3.14 cm   RIGHT VENTRICLE RV S prime:     11.20 cm/s TAPSE (M-mode): 2.2 cm  LEFT ATRIUM             Index LA diam:        4.40 cm 2.32 cm/m LA Vol (A2C):   40.9 ml 21.58 ml/m LA Vol (A4C):   49.8 ml 26.27 ml/m LA Biplane Vol: 46.3 ml 24.43 ml/m AORTIC VALVE AV Area (Vmax):    0.54 cm AV Area (Vmean):   0.61 cm AV Area (VTI):     0.58 cm AV Vmax:           492.00 cm/s AV Vmean:          312.000 cm/s AV VTI:            1.280 m AV Peak Grad:      96.8 mmHg AV Mean Grad:      48.0 mmHg LVOT Vmax:         84.60 cm/s LVOT Vmean:        60.900 cm/s LVOT VTI:          0.237 m LVOT/AV VTI ratio: 0.19  AORTA Ao Sinus diam: 2.44 cm Ao Asc diam:   3.00 cm  MITRAL VALVE MV Area (PHT): 2.20 cm     SHUNTS MV Decel Time: 345 msec     Systemic VTI:  0.24 m MV E velocity: 81.30 cm/s   Systemic Diam: 2.00 cm MV A velocity: 120.00 cm/s MV E/A ratio:  0.68  Redell Cave  MD Electronically signed by Redell Cave MD Signature Date/Time: 03/05/2024/4:38:31 PM    Final      CT SCANS  CT CORONARY MORPH W/CTA COR W/SCORE 04/27/2024  Addendum 04/28/2024  5:18 PM ADDENDUM REPORT: 04/28/2024 17:15  EXAM: OVER-READ INTERPRETATION CT CHEST  The following report is an over-read performed by radiologist Dr. Rogelia Hammans [facility] on 04/28/2024. This over-read does not include interpretation of cardiac or coronary anatomy or pathology. The coronary CTA interpretation by the cardiologist is attached.  COMPARISON:  None Available.  FINDINGS: Pulmonary Embolism: No pulmonary embolism.  Cardiovascular: Normal appearance of extracardiac vascular structures. No pericardial effusion. No aortic aneurysm. Scattered aortic atherosclerosis.  Mediastinum/Nodes: No mediastinal mass. No mediastinal, hilar, or axillary lymphadenopathy. Normal esophagus.  Lungs/Pleura: The midline trachea and bronchi are patent. No focal airspace consolidation, pleural effusion, or pneumothorax. 4 mm nodule in the lingula (axial 19). 3.5 mm anterior basal left lower lobe nodule (axial 41). 3 mm lateral basal right lower lobe nodule (axial 28). 3  mm nodule in the posterior right middle lobe (axial 22).  Musculoskeletal: No acute fracture or destructive bone lesion. Multilevel degenerative disc disease of the spine.  Upper Abdomen: No acute abnormality in the partially visualized upper abdomen.  IMPRESSION: 1. No acute intrathoracic abnormality; specifically, no pneumonia, pulmonary edema, or pleural effusion. 2. Multiple small bilateral pulmonary nodules in the lung bases measuring up to 4 mm. No follow-up needed if patient is low-risk (and has no known or suspected primary neoplasm). Non-contrast chest CT can be considered in 12 months if patient is high-risk. This recommendation follows the consensus statement: Guidelines for Management of Incidental  Pulmonary Nodules Detected on CT Images: From the Fleischner Society 2017; Radiology 2017; 284:228-243.   Electronically Signed By: Rogelia Myers M.D. On: 04/28/2024 17:15  Narrative MEDICATIONS: MEDICATIONS None  CLINICAL DATA:  Aortic stenosis  EXAM: Cardiac TAVR CT  TECHNIQUE: The patient was scanned on a GE apex scanner. 1 beat acquisition triggered in the descending thoracic aorta at 110 HU's. A non contrast, gated CT scan was obtained first with axial slices of 2.5 mm through the heart for valve scoring. A 120 kV retrospective, gated, contrast scan done with gantry rotation speed of 230 msec and collimation 0.63 mm. A delayed scan was obtained to exclude LAA thrombus. The 3D data set was reconstructed in 5% intervals of the R-R cycle. Best systolic phase was motion corrected Images were analyzed on a dedicated workstation using MPR, MIP and VRT modes The patient received 100 cc of contrast  FINDINGS: Aortic Valve: Bicuspid with raphe between right/left cusps Calcium  score 1920 Bulk of calcium  is in non coronary leaflet with mild calcium  in raphe.  Aorta: No coarctation/aneurysm. Normal arch vessels Mild calcific atherosclerosis  Sinotubular Junction: 26.5 mm  Ascending Thoracic Aorta: 32 mm  Aortic Arch: 26 mm  Descending Thoracic Aorta: 23 mm  Sinus of Valsalva Measurements: Commissural distance 24 mm  Non-coronary: 30 mm Height 19.6 mm  Right - coronary: 28.6 mm Height 17.6 mm  Left - coronary: 28.9 mm Height 16.6 mm  Coronary Artery Height above Annulus:  Left Main: 13.1 mm above annulus  Right Coronary: 14.7 mm above annulus  Virtual Basal Annulus Measurements:  Maximum/Minimum Diameter: 26.4 mm x 18.5 mm Average diameter 22.4 mm  Perimeter: 72.9 mm  Area: 395 mm2  Coronary Arteries: Sufficient height above annulus for deployment  Optimum Fluoroscopic Angle for Delivery: LAO 0 Caudal 13 degrees  Membranous septal length 6.5  mm  IMPRESSION: 1. Sievers type one bicuspid aortic valve. Mildly calcified raphe between left/right cusps Score 1920  2. Annular area of 395 mm 2 suitable for a 23 mm Sapien valve For Medtronic valve perimeter and sinuses borderline for 29 Evolute FX  3.  Coronary arteries sufficient height for deployment  4.  Membranous septal length 6.5 mm  5. Optimum angiographic angle for deployment LAO 0 Caudal 13 degrees  6. In regard to SAVR vs TAVR no ascending thoracic aneurysm or coarctation  Maude Emmer  Electronically Signed: By: Maude Emmer M.D. On: 04/27/2024 11:38     ______________________________________________________________________________________________      ECG sinus    I have independently reviewed the above radiologic studies and discussed with the patient   Recent Lab Findings: Lab Results  Component Value Date   WBC 12.9 (H) 03/13/2024   HGB 12.9 03/26/2024   HCT 38.0 03/26/2024   PLT 358 03/13/2024   GLUCOSE 204 (H) 04/10/2024   CHOL 189 01/24/2024   TRIG 119.0  01/24/2024   HDL 55.40 01/24/2024   LDLCALC 110 (H) 01/24/2024   ALT 19 11/04/2023   AST 18 11/04/2023   NA 138 04/10/2024   K 4.8 04/10/2024   CL 100 04/10/2024   CREATININE 0.96 04/10/2024   BUN 17 04/10/2024   CO2 21 04/10/2024   TSH 2.900 03/21/2023   HGBA1C 9.0 (H) 01/24/2024      Assessment / Plan:   68 y.o. female with severe aortic stenosis.  STS score: ***.  NYHA Class ***.  She has a bicuspid aortic valve, and no ascending aortic aneurysm.  She sized to a 23mm valve, and based off of her BSA this should be sufficient without a root enlargement.  The risks and benefits of a bioprosthetic AVR were discussed in detail.  The patient is *** agreeable to proceed.        I  spent {CHL ONC TIME VISIT - DTPQU:[id number]} counseling the patient face to face.   Linnie [name] 05/16/2024 12:56 PM

## 2024-05-17 ENCOUNTER — Encounter: Payer: Self-pay | Admitting: Pharmacist

## 2024-05-17 ENCOUNTER — Other Ambulatory Visit: Payer: Self-pay | Admitting: Pharmacist

## 2024-05-17 ENCOUNTER — Ambulatory Visit
Attending: Thoracic Surgery (Cardiothoracic Vascular Surgery) | Admitting: Thoracic Surgery (Cardiothoracic Vascular Surgery)

## 2024-05-17 VITALS — BP 150/81 | HR 69 | Resp 20 | Ht 62.0 in | Wt 203.2 lb

## 2024-05-17 DIAGNOSIS — I350 Nonrheumatic aortic (valve) stenosis: Secondary | ICD-10-CM

## 2024-05-17 MED ORDER — INSULIN ASPART 100 UNIT/ML FLEXPEN: PEN_INJECTOR | SUBCUTANEOUS | 0 refills | Status: DC

## 2024-05-17 NOTE — Progress Notes (Signed)
 05/17/2024 Name: Shari Lee MRN: [id number] DOB: [birth year]  Chief Complaint  Patient presents with   Diabetes    Shari Lee is a 68 y.o. year old female who presented for a telephone visit.   They were referred to the pharmacist by their PCP for assistance in managing diabetes and medication access.    Subjective:   Medication Access/Adherence  Current Pharmacy:  [facility] [address] ([address]),  - [address] [place]) [place] [address] Phone: [phone] Fax: [phone]  [facility] - [place], [place] - [address] [place] [address] Phone: [phone] Fax: [phone]   Patient reports affordability concerns with their medications: Yes  Patient reports access/transportation concerns to their pharmacy: No  Patient reports adherence concerns with their medications:  Yes      Diabetes:  Current medications:  [name] 27 units twice a day (dose increased from 23 units twice a day 05/08/2024)  - approved to get thru [employer] thru 10/17/2024 Plan to start Novolog  / insulin  aspart with largest meal but we are waiting on shipment form [employer] medication assistance program.  Farxiga  10mg  daily - getting from [facility] and Me patient assistance program - approved thru 10/17/2024. Sent updated prescription to program 05/07/24   She was previously following a very low CHO but high meat / protein diet. Blood glucose was really well controlled but she found this type of diet ws hard to sustain. She reports she tired of eating so much meat. She had diffculty managing her diet.     Medications tried in the past: Ozempic  - stopped due to pancreatitis; metformin  - worsened IBS related diarrhea   Date of Download: 05/17/2024 % Time CGM is active: 97%  Average Glucose: 241 mg/dL (previously 744 mg/dL) Glucose Management Indicator: 9.1% (preciously was 9.4%) Glucose Variability: 34.9% (goal <36%)   Time in Goal:  - Time in range 70-180: 27% (previously 19%) - Time above range: 72% ( previously 81%) - Time below range: 1% ( previously 0%)  Blood glucose trends - Average blood glucose is trending back up. No hypoglycemia noted. Noted to have increase in blood glucose postprandially.        Wt Readings from Last 3 Encounters:  04/10/24 198 lb (89.8 kg)  04/05/24 199 lb (90.3 kg)  03/26/24 199 lb 4.8 oz (90.4 kg)   Hyperlipidemia/ASCVD Risk Reduction  Current lipid lowering medications: ezetimibe  10mg  daily Medications tried in the past: rosuvastatin  - patient stop because she felt It was affecting her memory.   Objective:  Lab Results  Component Value Date   HGBA1C 9.0 (H) 01/24/2024   BP Readings from Last 3 Encounters:  04/10/24 117/74  04/05/24 (!) 180/78  03/26/24 (!) 149/66     Lab Results  Component Value Date   CREATININE 0.96 04/10/2024   BUN 17 04/10/2024   NA 138 04/10/2024   K 4.8 04/10/2024   CL 100 04/10/2024   CO2 21 04/10/2024    Lab Results  Component Value Date   CHOL 189 01/24/2024   HDL 55.40 01/24/2024   LDLCALC 110 (H) 01/24/2024   TRIG 119.0 01/24/2024   CHOLHDL 3 01/24/2024    Medications Reviewed Today     Reviewed by Carla Milling, RPH-CPP (Pharmacist) on 05/17/24 at 1256  Med List Status: <None>   Medication Order Taking? Sig Documenting Provider Last Dose Status Informant  albuterol  (VENTOLIN  HFA) 108 (90 Base) MCG/ACT inhaler [id number]  Inhale 2 puffs into the lungs every 6 (six) hours as needed for wheezing or shortness of breath. Emilio Kelly DASEN, FNP  Active Self  amLODipine -benazepril  (LOTREL) 5-20 MG capsule [id number]  Take 1 capsule by mouth daily. Cyndi Shaver, PA-C  Active Self  aspirin  81 MG chewable tablet [id number]  Chew 1 tablet by mouth daily. [provider]  Active   atenolol  (TENORMIN ) 50 MG tablet [id number]  Take 1 tablet (50 mg total) by mouth daily. Cyndi Shaver, PA-C  Active Self  Blood  Glucose Monitoring Suppl (CONTOUR NEXT USB MONITOR) w/Device KIT [id number]  1 kit by Does not apply route daily. To check blood sugar once daily. Vivienne Delon HERO, PA-C  Active Self  CareTouch Safety Lancets 26G MISC [id number]  1 Device by Does not apply route daily. To check blood sugar once daily. Vivienne Delon HERO, PA-C  Active Self  Continuous Glucose Sensor (FREESTYLE LIBRE 3 PLUS SENSOR) MISC [id number]  USE AS DIRECTED. CHANGE SENSOR EVERY 15 DAYS. Cyndi Shaver, PA-C  Active Self  dapagliflozin  propanediol (FARXIGA ) 10 MG TABS tablet [id number]  Take 1 tablet (10 mg total) by mouth daily. For 2025 [place] Patient Assistance Program Cyndi Shaver, PA-C  Active   ezetimibe  (ZETIA ) 10 MG tablet [id number]  Take 1 tablet (10 mg total) by mouth daily. Cyndi Shaver, PA-C  Active   glucose blood (CONTOUR NEXT TEST) test strip [id number]  To check blood sugar once daily. Vivienne Delon HERO, PA-C  Active Self  insulin  aspart (NOVOLOG ) 100 UNIT/ML injection [id number]  Inject 4-10 Units into the skin daily. Prior to largest meal of the day (received from [employer] medication assistance program thru 10/17/2024)  Patient not taking: Reported on 05/08/2024   Carla Milling, RPH-CPP  Active   insulin  degludec ([name] FLEXTOUCH) 100 UNIT/ML FlexTouch Pen [id number]  Inject 23 Units into the skin 2 (two) times daily. [provider]  Active Self           Med Note ELSWORTH, JACQUELINE L   Wed Feb 01, 2024  2:55 PM)    Insulin  Pen Needle (NOVOFINE PLUS) 32G X 4 MM MISC [id number]  To use with ozempic  injections Burnette, Jennifer M, PA-C  Active Self  montelukast  (SINGULAIR ) 10 MG tablet [id number]  TAKE 1 TABLET BY MOUTH AT BEDTIME Cyndi Shaver, PA-C  Active Self  omeprazole (PRILOSEC OTC) 20 MG tablet [id number]  Take 20 mg by mouth daily. [provider]  Active Self              Assessment/Plan:  Diabetes: blood glucose and GMI over the last 2 weeks has not been at goal. Blood  glucose is very dependent on her diet choices and compliance with therapy - Continue [name] 27 units twice a day - Called to check on Novolog  / insulin  aspart fill and estimated delivery from [employer] medication assistance program. First fill began processing 04/16/2024 but has not been shipped yet. Representative states that expedited shipping request was denied. Dhe provided a number patient can call to request an immediate need voucher that she can use to get 30 day supply from her pharmacy. [phone]. Plan is to add meal time insulin  with largest meal of the day to start. - insulin  aspart 4 units with largest meal.  - Continue Farxiga  10mg  daily.  - Continue to use Libre 3 sensors to check blood glucose continuously.   Hyperlipidemia/ASCVD Risk Reduction:Currently uncontrolled.  - Reviewed long term complications of uncontrolled cholesterol - continue ezetimibe  10mg  daily  Follow Up Plan: 5 to 10 days  [name], PharmD Clinical Pharmacist [facility] Primary Care  Population Health [phone]

## 2024-05-18 ENCOUNTER — Telehealth: Payer: Self-pay | Admitting: Pharmacist

## 2024-05-18 ENCOUNTER — Other Ambulatory Visit: Payer: Self-pay | Admitting: *Deleted

## 2024-05-18 ENCOUNTER — Encounter: Payer: Self-pay | Admitting: *Deleted

## 2024-05-18 ENCOUNTER — Encounter: Admitting: Thoracic Surgery (Cardiothoracic Vascular Surgery)

## 2024-05-18 DIAGNOSIS — I350 Nonrheumatic aortic (valve) stenosis: Secondary | ICD-10-CM

## 2024-05-18 MED ORDER — ~~LOC~~ CARDIAC SURGERY, PATIENT & FAMILY EDUCATION: Freq: Once | Status: DC

## 2024-05-18 NOTE — Telephone Encounter (Signed)
 Patient called to let me know she was about to get Novolog  voucher from [employer]. She picked up 1 box of Novolog  / insulin  aspart pens from her pharmacy.  Instructed patient to start with insulin  aspart 4 units with her evening meal. Will check in with her on Tuesday 8/5.

## 2024-05-22 ENCOUNTER — Other Ambulatory Visit: Payer: Self-pay | Admitting: Pharmacist

## 2024-05-22 NOTE — Progress Notes (Signed)
 05/22/2024 Name: Shari Lee MRN: [id number] DOB: January 02, 1956  Chief Complaint  Patient presents with   Diabetes    Shari Lee is a 68 y.o. year old female who presented for a telephone visit.   They were referred to the pharmacist by their PCP for assistance in managing diabetes and medication access.    Subjective:   Medication Access/Adherence  Current Pharmacy:  [facility] [address] ([address]), [place] - [address] [place]) [place] [address] Phone: [phone] Fax: [phone]  [facility] - [place], [place] - [address] [place] [address] Phone: [phone] Fax: [phone]   Patient reports affordability concerns with their medications: Yes  Patient reports access/transportation concerns to their pharmacy: No  Patient reports adherence concerns with their medications:  Yes      Diabetes:  Current medications:  [name] 25 units twice a day  - approved to get thru [employer] thru 10/17/2024 Started Novolog  / insulin  aspart 4 units with largest meal Friday 05/18/2024  Farxiga  10mg  daily - getting from [facility] and Me patient assistance program - approved thru 10/17/2024. Sent updated prescription to program 05/07/24   She was previously following a very low CHO but high meat / protein diet. Blood glucose was really well controlled but she found this type of diet ws hard to sustain. She reports she tired of eating so much meat. She had diffculty managing her diet.     Medications tried in the past: Ozempic  - stopped due to pancreatitis; metformin  - worsened IBS related diarrhea   Date of Download: 05/22/2024 % Time CGM is active: 97%  Average Glucose: 230 mg/dL (previously 758 mg/dL) Glucose Management Indicator: 8.8% (preciously was 9.1%) Glucose Variability: 35.8% (goal <36%)  Time in Goal:  - Time in range 70-180: 33% (previously 27%) - Time in high range 181 to 250: 27% - Time in very  high range > 250: 39%  - Time below range: 1% ( previously 0%)  Blood glucose trends - Average blood glucose is trending down since short actin insulin  was added to evening meal. Patient has a reading of 87 recently. She states she felt a little shaky and treated like low blood glucose event.               Wt Readings from Last 3 Encounters:  05/17/24 203 lb 3.2 oz (92.2 kg)  04/10/24 198 lb (89.8 kg)  04/05/24 199 lb (90.3 kg)   Hyperlipidemia/ASCVD Risk Reduction  Current lipid lowering medications: ezetimibe  10mg  daily Medications tried in the past: rosuvastatin  - patient stop because she felt It was affecting her memory.   Objective:  Lab Results  Component Value Date   HGBA1C 9.0 (H) 01/24/2024   BP Readings from Last 3 Encounters:  05/17/24 (!) 150/81  04/10/24 117/74  04/05/24 (!) 180/78     Lab Results  Component Value Date   CREATININE 0.96 04/10/2024   BUN 17 04/10/2024   NA 138 04/10/2024   K 4.8 04/10/2024   CL 100 04/10/2024   CO2 21 04/10/2024    Lab Results  Component Value Date   CHOL 189 01/24/2024   HDL 55.40 01/24/2024   LDLCALC 110 (H) 01/24/2024   TRIG 119.0 01/24/2024   CHOLHDL 3 01/24/2024    Medications Reviewed Today   Medications were not reviewed in this encounter       Assessment/Plan:  Diabetes: blood glucose and GMI over the  last 2 weeks has not been at goal. Blood glucose is trending down since starting short acting insulin  with largest meal of the day. - Decrease [name] to 23units twice a day - Increase Novolog  / insulin  aspart to 5 units with each meal.  - Continue Farxiga  10mg  daily.  - Continue to use Libre 3 sensors to check blood glucose continuously.    Follow Up Plan: 2 weeks   [name], PharmD Clinical Pharmacist [facility] Primary Care  Population Health [phone]

## 2024-05-28 ENCOUNTER — Other Ambulatory Visit: Payer: Self-pay | Admitting: Physician Assistant

## 2024-05-28 ENCOUNTER — Telehealth: Payer: Self-pay | Admitting: Neurology

## 2024-05-28 DIAGNOSIS — R0981 Nasal congestion: Secondary | ICD-10-CM

## 2024-05-28 NOTE — Telephone Encounter (Signed)
 Received insulin  shipment from patient assistance. Placed in fridge. Patient called and made aware.

## 2024-06-11 NOTE — H&P (View-Only) (Signed)
 [address]       [place] [address]             [phone]        Jesseca Marsch [facility] Medical Record #[id number] Date of Birth: Feb 26, 1956  Referring: Cyndi Shaver, PA-C Primary Care: Cyndi Shaver, PA-C Primary Cardiologist:Brian Darliss, MD  Chief Complaint:    No chief complaint on file.   History of Present Illness:     Salimata Christenson is a 68 y.o. female presents for surgical evaluation of severe aortic stenosis.  She admits to occasional chest pain.  She regularly has shortness of breath, and denies any syncopal episodes, but does have some dizziness.      No significant changes since her last clinic appointment.     Past Medical History:  Diagnosis Date   Asthma    mild   Diarrhea    N/V recently   Dyspnea    GERD (gastroesophageal reflux disease)    Headache    Heart murmur    developed in last couple years/ no issues   HOH (hard of hearing)    wears aids   Hypertension    Motion sickness    car   Type 2 diabetes mellitus with hyperglycemia [facility])     Past Surgical History:  Procedure Laterality Date   ABDOMINAL HYSTERECTOMY  2006   BREAST BIOPSY Left 2005   benign   BREAST EXCISIONAL BIOPSY Left 2005?    -benign   CATARACT EXTRACTION W/PHACO Right 11/30/2022   Procedure: CATARACT EXTRACTION PHACO AND INTRAOCULAR LENS PLACEMENT (IOC) RIGHT DIABETIC;  Surgeon: Jaye Fallow, MD;  Location: [facility] SURGERY [facility];  Service: Ophthalmology;  Laterality: Right;  4.96 0:37.3   CATARACT EXTRACTION W/PHACO Left 12/14/2022   Procedure: CATARACT EXTRACTION PHACO AND INTRAOCULAR LENS PLACEMENT (IOC) LEFT DIABETIC;  Surgeon: Jaye Fallow, MD;  Location: [facility] SURGERY [facility]TR;  Service: Ophthalmology;  Laterality: Left;  5.98 0:40.6   COLONOSCOPY WITH PROPOFOL  N/A 08/13/2016   Procedure: COLONOSCOPY WITH PROPOFOL ;  Surgeon: Rogelia Copping, MD;  Location: [facility]ERY [facility];  Service: Endoscopy;  Laterality: N/A;    COLONOSCOPY WITH PROPOFOL  N/A 02/09/2022   Procedure: COLONOSCOPY WITH PROPOFOL ;  Surgeon: Copping Rogelia, MD;  Location: [facility] ENDOSCOPY;  Service: Endoscopy;  Laterality: N/A;   POLYPECTOMY  08/13/2016   Procedure: POLYPECTOMY;  Surgeon: Rogelia Copping, MD;  Location: [facility] SURGERY [facility];  Service: Endoscopy;;   RIGHT HEART CATH AND CORONARY ANGIOGRAPHY N/A 03/26/2024   Procedure: RIGHT HEART CATH AND CORONARY ANGIOGRAPHY;  Surgeon: Darron Deatrice LABOR, MD;  Location: [facility] INVASIVE CV LAB;  Service: Cardiovascular;  Laterality: N/A;    Social History:  Social History   Tobacco Use  Smoking Status Never  Smokeless Tobacco Never    Social History   Substance and Sexual Activity  Alcohol Use No   Alcohol/week: 0.0 standard drinks of alcohol     Allergies  Allergen Reactions   Amoxicillin-Pot Clavulanate Other (See Comments)    Deathly sick per patient   Levsin  [Hyoscyamine]     Dizziness   Ozempic  (0.25 Or 0.5 Mg-Dose) [Semaglutide (0.25 Or 0.5mg -Dos)] Other (See Comments)    pancreatitis   Shellfish Allergy Diarrhea and Nausea And Vomiting    dizziness   Metformin  And Related Diarrhea      Current Outpatient Medications  Medication Sig Dispense Refill   albuterol  (VENTOLIN  HFA) 108 (90 Base) MCG/ACT inhaler Inhale 2 puffs into the lungs every 6 (six) hours as needed for  wheezing or shortness of breath. 8 g 2   amLODipine -benazepril  (LOTREL) 5-20 MG capsule Take 1 capsule by mouth daily. 90 capsule 0   aspirin  81 MG chewable tablet Chew 1 tablet by mouth daily.     atenolol  (TENORMIN ) 50 MG tablet Take 1 tablet (50 mg total) by mouth daily. 90 tablet 0   Blood Glucose Monitoring Suppl (CONTOUR NEXT USB MONITOR) w/Device KIT 1 kit by Does not apply route daily. To check blood sugar once daily. 1 kit 0   CareTouch Safety Lancets 26G MISC 1 Device by Does not apply route daily. To check blood sugar once daily. 100 each 12   cetirizine  (ZYRTEC ) 10 MG tablet Take 1 tablet by mouth once  daily 90 tablet 0   chlorhexidine  (PERIDEX ) 0.12 % solution      Continuous Glucose Sensor (FREESTYLE LIBRE 3 PLUS SENSOR) MISC USE AS DIRECTED. CHANGE SENSOR EVERY 15 DAYS. 6 each 1   dapagliflozin  propanediol (FARXIGA ) 10 MG TABS tablet Take 1 tablet (10 mg total) by mouth daily. For 2025 [place] Patient Assistance Program 90 tablet 3   doxycycline  (MONODOX ) 50 MG capsule Take 50 mg by mouth 2 (two) times daily.     ezetimibe  (ZETIA ) 10 MG tablet Take 1 tablet (10 mg total) by mouth daily. 90 tablet 0   glucose blood (CONTOUR NEXT TEST) test strip To check blood sugar once daily. 100 each 5   insulin  aspart (NOVOLOG ) 100 UNIT/ML FlexPen Inject 4 to 10 units up to 3 times a day with a meal. (Patient taking differently: 4 Units. Inject 4 to 10 units up to 3 times a day with a meal.) 15 mL 0   insulin  degludec ([name] FLEXTOUCH) 100 UNIT/ML FlexTouch Pen Inject 23 Units into the skin 2 (two) times daily. (Patient taking differently: Inject 25 Units into the skin 2 (two) times daily.)     Insulin  Pen Needle (NOVOFINE PLUS) 32G X 4 MM MISC To use with ozempic  injections 100 each 3   montelukast  (SINGULAIR ) 10 MG tablet TAKE 1 TABLET BY MOUTH AT BEDTIME 90 tablet 0   omeprazole (PRILOSEC OTC) 20 MG tablet Take 20 mg by mouth daily.     No current facility-administered medications for this visit.    (Not in a hospital admission)   Family History  Problem Relation Age of Onset   Heart attack Mother    Diabetes Mother    Heart disease Mother    Heart failure Father    Breast cancer Sister    Sleep apnea Brother    Diverticulitis Brother    Diverticulitis Sister      Review of Systems:   Review of Systems  Constitutional:  Positive for malaise/fatigue.  Respiratory:  Positive for shortness of breath.   Cardiovascular:  Positive for chest pain.  Neurological:  Positive for dizziness.      Physical Exam: There were no vitals taken for this visit. Physical Exam Constitutional:       General: She is not in acute distress.    Appearance: She is not ill-appearing.  HENT:     Head: Normocephalic and atraumatic.  Eyes:     Extraocular Movements: Extraocular movements intact.  Cardiovascular:     Rate and Rhythm: Normal rate.  Pulmonary:     Effort: Pulmonary effort is normal. No respiratory distress.  Abdominal:     General: Abdomen is flat. There is no distension.  Musculoskeletal:        General: Normal range of motion.  Cervical back: Normal range of motion.  Skin:    General: Skin is warm and dry.  Neurological:     General: No focal deficit present.     Mental Status: She is alert and oriented to person, place, and time.       Cardiac Studies & Procedures   ______________________________________________________________________________________________ CARDIAC CATHETERIZATION  CARDIAC CATHETERIZATION 03/26/2024  Conclusion   Prox LAD lesion is 10% stenosed.  1.  Minor irregularities with no evidence of obstructive coronary artery disease. 2.  Severe aortic stenosis by echo.  I did not attempt to cross the aortic valve. 3.  Right heart catheterization showed normal right and left-sided filling pressures, normal pulmonary pressure and normal cardiac output.  Recommendations: Refer to the structural heart clinic for evaluation of bicuspid aortic valve replacement.  Findings Coronary Findings Diagnostic  Dominance: Right  Left Main Vessel is angiographically normal.  Left Anterior Descending Prox LAD lesion is 10% stenosed.  Ramus Intermedius Vessel is angiographically normal.  Left Circumflex The vessel exhibits minimal luminal irregularities.  Right Coronary Artery Vessel is angiographically normal.  Right Posterior Descending Artery Vessel is angiographically normal.  Right Posterior Atrioventricular Artery The vessel exhibits minimal luminal irregularities.  Intervention  No interventions have been documented.      ECHOCARDIOGRAM  ECHOCARDIOGRAM COMPLETE 03/05/2024  Narrative ECHOCARDIOGRAM REPORT    Patient Name:   KENYATTA GLOECKNER Date of Exam: 03/05/2024 Medical Rec #:  [id number]      Height:       62.0 in Accession #:    [id number]     Weight:       196.0 lb Date of Birth:  November 03, 1955      BSA:          1.895 m Patient Age:    68 years       BP:           130/78 mmHg Patient Gender: F              HR:           56 bpm. Exam Location:  [facility]  Procedure: 2D Echo, Cardiac Doppler and Color Doppler (Both Spectral and Color Flow Doppler were utilized during procedure).  Indications:    I35.0 Nonrheumatic aortic (valve) stenosis  History:        Patient has prior history of Echocardiogram examinations, most recent 01/27/2022. Aortic Valve Disease, Signs/Symptoms:Shortness of Breath, Dyspnea and Murmur; Risk Factors:Sleep Apnea, Hypertension, Diabetes, Dyslipidemia and Non-Smoker.  Sonographer:    Doyal Point MHA, BS, RDCS Referring Phys: [id number] SHERI HAMMOCK   Sonographer Comments: Patient is obese and suboptimal apical window. Image acquisition challenging due to patient body habitus. IMPRESSIONS   1. Left ventricular ejection fraction, by estimation, is 60 to 65%. Left ventricular ejection fraction by PLAX is 63 %. The left ventricle has normal function. The left ventricle has no regional wall motion abnormalities. There is mild left ventricular hypertrophy. Left ventricular diastolic parameters are consistent with Grade I diastolic dysfunction (impaired relaxation). 2. Right ventricular systolic function is normal. The right ventricular size is normal. 3. The mitral valve is grossly normal. No evidence of mitral valve regurgitation. 4. The aortic valve is bicuspid. Aortic valve regurgitation is not visualized. Severe aortic valve stenosis. Aortic valve area, by VTI measures 0.58 cm. Aortic valve mean gradient measures 48.0 mmHg. Aortic valve Vmax measures 4.92 m/s. 5. The  inferior vena cava is normal in size with greater than 50% respiratory variability, suggesting right atrial pressure of  3 mmHg.  Comparison(s): Severe AS is now present from prior 2023 study.  Conclusion(s)/Recommendation(s): Findings consistent with severe valvular heart disease.  FINDINGS Left Ventricle: Left ventricular ejection fraction, by estimation, is 60 to 65%. Left ventricular ejection fraction by PLAX is 63 %. The left ventricle has normal function. The left ventricle has no regional wall motion abnormalities. The left ventricular internal cavity size was normal in size. There is mild left ventricular hypertrophy. Left ventricular diastolic parameters are consistent with Grade I diastolic dysfunction (impaired relaxation).  Right Ventricle: The right ventricular size is normal. No increase in right ventricular wall thickness. Right ventricular systolic function is normal.  Left Atrium: Left atrial size was normal in size.  Right Atrium: Right atrial size was normal in size.  Pericardium: There is no evidence of pericardial effusion.  Mitral Valve: The mitral valve is grossly normal. Mild mitral annular calcification. No evidence of mitral valve regurgitation.  Tricuspid Valve: The tricuspid valve is normal in structure. Tricuspid valve regurgitation is mild.  Aortic Valve: The aortic valve is bicuspid. Aortic valve regurgitation is not visualized. Severe aortic stenosis is present. Aortic valve mean gradient measures 48.0 mmHg. Aortic valve peak gradient measures 96.8 mmHg. Aortic valve area, by VTI measures 0.58 cm.  Pulmonic Valve: The pulmonic valve was not well visualized. Pulmonic valve regurgitation is not visualized.  Aorta: The aortic root and ascending aorta are structurally normal, with no evidence of dilitation.  Venous: The inferior vena cava is normal in size with greater than 50% respiratory variability, suggesting right atrial pressure of 3 mmHg.  IAS/Shunts:  No atrial level shunt detected by color flow Doppler.   LEFT VENTRICLE PLAX 2D LV EF:         Left            Diastology ventricular     LV e' medial:    8.05 cm/s ejection        LV E/e' medial:  10.1 fraction by     LV e' lateral:   4.46 cm/s PLAX is 63      LV E/e' lateral: 18.2 %. LVIDd:         3.90 cm LVIDs:         2.59 cm LV PW:         1.07 cm LV IVS:        1.00 cm LVOT diam:     2.00 cm LV SV:         74 LV SV Index:   39 LVOT Area:     3.14 cm   RIGHT VENTRICLE RV S prime:     11.20 cm/s TAPSE (M-mode): 2.2 cm  LEFT ATRIUM             Index LA diam:        4.40 cm 2.32 cm/m LA Vol (A2C):   40.9 ml 21.58 ml/m LA Vol (A4C):   49.8 ml 26.27 ml/m LA Biplane Vol: 46.3 ml 24.43 ml/m AORTIC VALVE AV Area (Vmax):    0.54 cm AV Area (Vmean):   0.61 cm AV Area (VTI):     0.58 cm AV Vmax:           492.00 cm/s AV Vmean:          312.000 cm/s AV VTI:            1.280 m AV Peak Grad:      96.8 mmHg AV Mean Grad:      48.0 mmHg  LVOT Vmax:         84.60 cm/s LVOT Vmean:        60.900 cm/s LVOT VTI:          0.237 m LVOT/AV VTI ratio: 0.19  AORTA Ao Sinus diam: 2.44 cm Ao Asc diam:   3.00 cm  MITRAL VALVE MV Area (PHT): 2.20 cm     SHUNTS MV Decel Time: 345 msec     Systemic VTI:  0.24 m MV E velocity: 81.30 cm/s   Systemic Diam: 2.00 cm MV A velocity: 120.00 cm/s MV E/A ratio:  0.68  Redell Cave MD Electronically signed by Redell Cave MD Signature Date/Time: 03/05/2024/4:38:31 PM    Final      CT SCANS  CT CORONARY MORPH W/CTA COR W/SCORE 04/27/2024  Addendum 04/28/2024  5:18 PM ADDENDUM REPORT: 04/28/2024 17:15  EXAM: OVER-READ INTERPRETATION CT CHEST  The following report is an over-read performed by radiologist Dr. Rogelia Hammans [facility] on 04/28/2024. This over-read does not include interpretation of cardiac or coronary anatomy or pathology. The coronary CTA interpretation by the cardiologist is  attached.  COMPARISON:  None Available.  FINDINGS: Pulmonary Embolism: No pulmonary embolism.  Cardiovascular: Normal appearance of extracardiac vascular structures. No pericardial effusion. No aortic aneurysm. Scattered aortic atherosclerosis.  Mediastinum/Nodes: No mediastinal mass. No mediastinal, hilar, or axillary lymphadenopathy. Normal esophagus.  Lungs/Pleura: The midline trachea and bronchi are patent. No focal airspace consolidation, pleural effusion, or pneumothorax. 4 mm nodule in the lingula (axial 19). 3.5 mm anterior basal left lower lobe nodule (axial 41). 3 mm lateral basal right lower lobe nodule (axial 28). 3 mm nodule in the posterior right middle lobe (axial 22).  Musculoskeletal: No acute fracture or destructive bone lesion. Multilevel degenerative disc disease of the spine.  Upper Abdomen: No acute abnormality in the partially visualized upper abdomen.  IMPRESSION: 1. No acute intrathoracic abnormality; specifically, no pneumonia, pulmonary edema, or pleural effusion. 2. Multiple small bilateral pulmonary nodules in the lung bases measuring up to 4 mm. No follow-up needed if patient is low-risk (and has no known or suspected primary neoplasm). Non-contrast chest CT can be considered in 12 months if patient is high-risk. This recommendation follows the consensus statement: Guidelines for Management of Incidental Pulmonary Nodules Detected on CT Images: From the Fleischner Society 2017; Radiology 2017; 284:228-243.   Electronically Signed By: Rogelia Myers M.D. On: 04/28/2024 17:15  Narrative MEDICATIONS: MEDICATIONS None  CLINICAL DATA:  Aortic stenosis  EXAM: Cardiac TAVR CT  TECHNIQUE: The patient was scanned on a GE apex scanner. 1 beat acquisition triggered in the descending thoracic aorta at 110 HU's. A non contrast, gated CT scan was obtained first with axial slices of 2.5 mm through the heart for valve scoring. A 120 kV  retrospective, gated, contrast scan done with gantry rotation speed of 230 msec and collimation 0.63 mm. A delayed scan was obtained to exclude LAA thrombus. The 3D data set was reconstructed in 5% intervals of the R-R cycle. Best systolic phase was motion corrected Images were analyzed on a dedicated workstation using MPR, MIP and VRT modes The patient received 100 cc of contrast  FINDINGS: Aortic Valve: Bicuspid with raphe between right/left cusps Calcium  score 1920 Bulk of calcium  is in non coronary leaflet with mild calcium  in raphe.  Aorta: No coarctation/aneurysm. Normal arch vessels Mild calcific atherosclerosis  Sinotubular Junction: 26.5 mm  Ascending Thoracic Aorta: 32 mm  Aortic Arch: 26 mm  Descending Thoracic Aorta: 23 mm  Sinus of Valsalva Measurements: Commissural distance 24 mm  Non-coronary: 30 mm Height 19.6 mm  Right - coronary: 28.6 mm Height 17.6 mm  Left - coronary: 28.9 mm Height 16.6 mm  Coronary Artery Height above Annulus:  Left Main: 13.1 mm above annulus  Right Coronary: 14.7 mm above annulus  Virtual Basal Annulus Measurements:  Maximum/Minimum Diameter: 26.4 mm x 18.5 mm Average diameter 22.4 mm  Perimeter: 72.9 mm  Area: 395 mm2  Coronary Arteries: Sufficient height above annulus for deployment  Optimum Fluoroscopic Angle for Delivery: LAO 0 Caudal 13 degrees  Membranous septal length 6.5 mm  IMPRESSION: 1. Sievers type one bicuspid aortic valve. Mildly calcified raphe between left/right cusps Score 1920  2. Annular area of 395 mm 2 suitable for a 23 mm Sapien valve For [employer] valve perimeter and sinuses borderline for 29 Evolute FX  3.  Coronary arteries sufficient height for deployment  4.  Membranous septal length 6.5 mm  5. Optimum angiographic angle for deployment LAO 0 Caudal 13 degrees  6. In regard to SAVR vs TAVR no ascending thoracic aneurysm or coarctation  Maude Emmer  Electronically Signed: By:  Maude Emmer M.D. On: 04/27/2024 11:38     ______________________________________________________________________________________________      ECG sinus    I have independently reviewed the above radiologic studies and discussed with the patient   Recent Lab Findings: Lab Results  Component Value Date   WBC 12.9 (H) 03/13/2024   HGB 12.9 03/26/2024   HCT 38.0 03/26/2024   PLT 358 03/13/2024   GLUCOSE 204 (H) 04/10/2024   CHOL 189 01/24/2024   TRIG 119.0 01/24/2024   HDL 55.40 01/24/2024   LDLCALC 110 (H) 01/24/2024   ALT 19 11/04/2023   AST 18 11/04/2023   NA 138 04/10/2024   K 4.8 04/10/2024   CL 100 04/10/2024   CREATININE 0.96 04/10/2024   BUN 17 04/10/2024   CO2 21 04/10/2024   TSH 2.900 03/21/2023   HGBA1C 9.0 (H) 01/24/2024      Assessment / Plan:   68 y.o. female with severe aortic stenosis.  STS score: 1.99.  NYHA Class II.  She has a bicuspid aortic valve, and no ascending aortic aneurysm.  She sized to a 23mm valve, and based off of her BSA this should be sufficient without a root enlargement.  The risks and benefits of a bioprosthetic AVR were discussed in detail.  The patient is agreeable to proceed.        I  spent 20 minutes counseling the patient face to face.   Linnie [name] 06/11/2024 4:17 PM

## 2024-06-11 NOTE — Progress Notes (Unsigned)
 [address]       [place] [address]             [phone]        [facility] Medical Record #[id number] Date of Birth: 12-22-1955  Referring: Cyndi Shaver, PA-C Primary Care: Cyndi Shaver, PA-C Primary Cardiologist:Brian Darliss, MD  Chief Complaint:    No chief complaint on file.   History of Present Illness:     Shari Lee is a 68 y.o. female presents for surgical evaluation of severe aortic stenosis.  She admits to occasional chest pain.  She regularly has shortness of breath, and denies any syncopal episodes, but does have some dizziness.      No significant changes since her last clinic appointment.     Past Medical History:  Diagnosis Date   Asthma    mild   Diarrhea    N/V recently   Dyspnea    GERD (gastroesophageal reflux disease)    Headache    Heart murmur    developed in last couple years/ no issues   HOH (hard of hearing)    wears aids   Hypertension    Motion sickness    car   Type 2 diabetes mellitus with hyperglycemia [facility])     Past Surgical History:  Procedure Laterality Date   ABDOMINAL HYSTERECTOMY  2006   BREAST BIOPSY Left 2005   benign   BREAST EXCISIONAL BIOPSY Left 2005?    -benign   CATARACT EXTRACTION W/PHACO Right 11/30/2022   Procedure: CATARACT EXTRACTION PHACO AND INTRAOCULAR LENS PLACEMENT (IOC) RIGHT DIABETIC;  Surgeon: Jaye Fallow, MD;  Location: [facility];  Service: Ophthalmology;  Laterality: Right;  4.96 0:37.3   CATARACT EXTRACTION W/PHACO Left 12/14/2022   Procedure: CATARACT EXTRACTION PHACO AND INTRAOCULAR LENS PLACEMENT (IOC) LEFT DIABETIC;  Surgeon: Jaye Fallow, MD;  Location: [facility];  Service: Ophthalmology;  Laterality: Left;  5.98 0:40.6   COLONOSCOPY WITH PROPOFOL  N/A 08/13/2016   Procedure: COLONOSCOPY WITH PROPOFOL ;  Surgeon: Rogelia Copping, MD;  Location: [facility];  Service: Endoscopy;  Laterality: N/A;    COLONOSCOPY WITH PROPOFOL  N/A 02/09/2022   Procedure: COLONOSCOPY WITH PROPOFOL ;  Surgeon: Copping Rogelia, MD;  Location: [facility] ENDOSCOPY;  Service: Endoscopy;  Laterality: N/A;   POLYPECTOMY  08/13/2016   Procedure: POLYPECTOMY;  Surgeon: Rogelia Copping, MD;  Location: [facility];  Service: Endoscopy;;   RIGHT HEART CATH AND CORONARY ANGIOGRAPHY N/A 03/26/2024   Procedure: RIGHT HEART CATH AND CORONARY ANGIOGRAPHY;  Surgeon: Darron Deatrice LABOR, MD;  Location: [facility] INVASIVE CV LAB;  Service: Cardiovascular;  Laterality: N/A;    Social History:  Social History   Tobacco Use  Smoking Status Never  Smokeless Tobacco Never    Social History   Substance and Sexual Activity  Alcohol Use No   Alcohol/week: 0.0 standard drinks of alcohol     Allergies  Allergen Reactions   Amoxicillin-Pot Clavulanate Other (See Comments)    Deathly sick per patient   Levsin  [Hyoscyamine]     Dizziness   Ozempic  (0.25 Or 0.5 Mg-Dose) [Semaglutide (0.25 Or 0.5mg -Dos)] Other (See Comments)    pancreatitis   Shellfish Allergy Diarrhea and Nausea And Vomiting    dizziness   Metformin  And Related Diarrhea      Current Outpatient Medications  Medication Sig Dispense Refill   albuterol  (VENTOLIN  HFA) 108 (90 Base) MCG/ACT inhaler Inhale 2 puffs into the lungs every 6 (six) hours as needed for  wheezing or shortness of breath. 8 g 2   amLODipine -benazepril  (LOTREL) 5-20 MG capsule Take 1 capsule by mouth daily. 90 capsule 0   aspirin  81 MG chewable tablet Chew 1 tablet by mouth daily.     atenolol  (TENORMIN ) 50 MG tablet Take 1 tablet (50 mg total) by mouth daily. 90 tablet 0   Blood Glucose Monitoring Suppl (CONTOUR NEXT USB MONITOR) w/Device KIT 1 kit by Does not apply route daily. To check blood sugar once daily. 1 kit 0   CareTouch Safety Lancets 26G MISC 1 Device by Does not apply route daily. To check blood sugar once daily. 100 each 12   cetirizine  (ZYRTEC ) 10 MG tablet Take 1 tablet by mouth once  daily 90 tablet 0   chlorhexidine (PERIDEX) 0.12 % solution      Continuous Glucose Sensor (FREESTYLE LIBRE 3 PLUS SENSOR) MISC USE AS DIRECTED. CHANGE SENSOR EVERY 15 DAYS. 6 each 1   dapagliflozin  propanediol (FARXIGA ) 10 MG TABS tablet Take 1 tablet (10 mg total) by mouth daily. For 2025 [place] Patient Assistance Program 90 tablet 3   doxycycline  (MONODOX ) 50 MG capsule Take 50 mg by mouth 2 (two) times daily.     ezetimibe  (ZETIA ) 10 MG tablet Take 1 tablet (10 mg total) by mouth daily. 90 tablet 0   glucose blood (CONTOUR NEXT TEST) test strip To check blood sugar once daily. 100 each 5   insulin  aspart (NOVOLOG ) 100 UNIT/ML FlexPen Inject 4 to 10 units up to 3 times a day with a meal. (Patient taking differently: 4 Units. Inject 4 to 10 units up to 3 times a day with a meal.) 15 mL 0   insulin  degludec ([name] FLEXTOUCH) 100 UNIT/ML FlexTouch Pen Inject 23 Units into the skin 2 (two) times daily. (Patient taking differently: Inject 25 Units into the skin 2 (two) times daily.)     Insulin  Pen Needle (NOVOFINE PLUS) 32G X 4 MM MISC To use with ozempic  injections 100 each 3   montelukast  (SINGULAIR ) 10 MG tablet TAKE 1 TABLET BY MOUTH AT BEDTIME 90 tablet 0   omeprazole (PRILOSEC OTC) 20 MG tablet Take 20 mg by mouth daily.     No current facility-administered medications for this visit.    (Not in a hospital admission)   Family History  Problem Relation Age of Onset   Heart attack Mother    Diabetes Mother    Heart disease Mother    Heart failure Father    Breast cancer Sister    Sleep apnea Brother    Diverticulitis Brother    Diverticulitis Sister      Review of Systems:   Review of Systems  Constitutional:  Positive for malaise/fatigue.  Respiratory:  Positive for shortness of breath.   Cardiovascular:  Positive for chest pain.  Neurological:  Positive for dizziness.      Physical Exam: There were no vitals taken for this visit. Physical Exam Constitutional:       General: She is not in acute distress.    Appearance: She is not ill-appearing.  HENT:     Head: Normocephalic and atraumatic.  Eyes:     Extraocular Movements: Extraocular movements intact.  Cardiovascular:     Rate and Rhythm: Normal rate.  Pulmonary:     Effort: Pulmonary effort is normal. No respiratory distress.  Abdominal:     General: Abdomen is flat. There is no distension.  Musculoskeletal:        General: Normal range of motion.  Cervical back: Normal range of motion.  Skin:    General: Skin is warm and dry.  Neurological:     General: No focal deficit present.     Mental Status: She is alert and oriented to person, place, and time.       Cardiac Studies & Procedures   ______________________________________________________________________________________________ CARDIAC CATHETERIZATION  CARDIAC CATHETERIZATION 03/26/2024  Conclusion   Prox LAD lesion is 10% stenosed.  1.  Minor irregularities with no evidence of obstructive coronary artery disease. 2.  Severe aortic stenosis by echo.  I did not attempt to cross the aortic valve. 3.  Right heart catheterization showed normal right and left-sided filling pressures, normal pulmonary pressure and normal cardiac output.  Recommendations: Refer to the structural heart clinic for evaluation of bicuspid aortic valve replacement.  Findings Coronary Findings Diagnostic  Dominance: Right  Left Main Vessel is angiographically normal.  Left Anterior Descending Prox LAD lesion is 10% stenosed.  Ramus Intermedius Vessel is angiographically normal.  Left Circumflex The vessel exhibits minimal luminal irregularities.  Right Coronary Artery Vessel is angiographically normal.  Right Posterior Descending Artery Vessel is angiographically normal.  Right Posterior Atrioventricular Artery The vessel exhibits minimal luminal irregularities.  Intervention  No interventions have been documented.      ECHOCARDIOGRAM  ECHOCARDIOGRAM COMPLETE 03/05/2024  Narrative ECHOCARDIOGRAM REPORT    Patient Name:   Shari Lee Date of Exam: 03/05/2024 Medical Rec #:  [id number]      Height:       62.0 in Accession #:    [id number]     Weight:       196.0 lb Date of Birth:  August 28, 1956      BSA:          1.895 m Patient Age:    68 years       BP:           130/78 mmHg Patient Gender: F              HR:           56 bpm. Exam Location:    Procedure: 2D Echo, Cardiac Doppler and Color Doppler (Both Spectral and Color Flow Doppler were utilized during procedure).  Indications:    I35.0 Nonrheumatic aortic (valve) stenosis  History:        Patient has prior history of Echocardiogram examinations, most recent 01/27/2022. Aortic Valve Disease, Signs/Symptoms:Shortness of Breath, Dyspnea and Murmur; Risk Factors:Sleep Apnea, Hypertension, Diabetes, Dyslipidemia and Non-Smoker.  Sonographer:    Doyal Point MHA, BS, RDCS Referring Phys: [id number] SHERI HAMMOCK   Sonographer Comments: Patient is obese and suboptimal apical window. Image acquisition challenging due to patient body habitus. IMPRESSIONS   1. Left ventricular ejection fraction, by estimation, is 60 to 65%. Left ventricular ejection fraction by PLAX is 63 %. The left ventricle has normal function. The left ventricle has no regional wall motion abnormalities. There is mild left ventricular hypertrophy. Left ventricular diastolic parameters are consistent with Grade I diastolic dysfunction (impaired relaxation). 2. Right ventricular systolic function is normal. The right ventricular size is normal. 3. The mitral valve is grossly normal. No evidence of mitral valve regurgitation. 4. The aortic valve is bicuspid. Aortic valve regurgitation is not visualized. Severe aortic valve stenosis. Aortic valve area, by VTI measures 0.58 cm. Aortic valve mean gradient measures 48.0 mmHg. Aortic valve Vmax measures 4.92 m/s. 5. The  inferior vena cava is normal in size with greater than 50% respiratory variability, suggesting right atrial pressure of  3 mmHg.  Comparison(s): Severe AS is now present from prior 2023 study.  Conclusion(s)/Recommendation(s): Findings consistent with severe valvular heart disease.  FINDINGS Left Ventricle: Left ventricular ejection fraction, by estimation, is 60 to 65%. Left ventricular ejection fraction by PLAX is 63 %. The left ventricle has normal function. The left ventricle has no regional wall motion abnormalities. The left ventricular internal cavity size was normal in size. There is mild left ventricular hypertrophy. Left ventricular diastolic parameters are consistent with Grade I diastolic dysfunction (impaired relaxation).  Right Ventricle: The right ventricular size is normal. No increase in right ventricular wall thickness. Right ventricular systolic function is normal.  Left Atrium: Left atrial size was normal in size.  Right Atrium: Right atrial size was normal in size.  Pericardium: There is no evidence of pericardial effusion.  Mitral Valve: The mitral valve is grossly normal. Mild mitral annular calcification. No evidence of mitral valve regurgitation.  Tricuspid Valve: The tricuspid valve is normal in structure. Tricuspid valve regurgitation is mild.  Aortic Valve: The aortic valve is bicuspid. Aortic valve regurgitation is not visualized. Severe aortic stenosis is present. Aortic valve mean gradient measures 48.0 mmHg. Aortic valve peak gradient measures 96.8 mmHg. Aortic valve area, by VTI measures 0.58 cm.  Pulmonic Valve: The pulmonic valve was not well visualized. Pulmonic valve regurgitation is not visualized.  Aorta: The aortic root and ascending aorta are structurally normal, with no evidence of dilitation.  Venous: The inferior vena cava is normal in size with greater than 50% respiratory variability, suggesting right atrial pressure of 3 mmHg.  IAS/Shunts:  No atrial level shunt detected by color flow Doppler.   LEFT VENTRICLE PLAX 2D LV EF:         Left            Diastology ventricular     LV e' medial:    8.05 cm/s ejection        LV E/e' medial:  10.1 fraction by     LV e' lateral:   4.46 cm/s PLAX is 63      LV E/e' lateral: 18.2 %. LVIDd:         3.90 cm LVIDs:         2.59 cm LV PW:         1.07 cm LV IVS:        1.00 cm LVOT diam:     2.00 cm LV SV:         74 LV SV Index:   39 LVOT Area:     3.14 cm   RIGHT VENTRICLE RV S prime:     11.20 cm/s TAPSE (M-mode): 2.2 cm  LEFT ATRIUM             Index LA diam:        4.40 cm 2.32 cm/m LA Vol (A2C):   40.9 ml 21.58 ml/m LA Vol (A4C):   49.8 ml 26.27 ml/m LA Biplane Vol: 46.3 ml 24.43 ml/m AORTIC VALVE AV Area (Vmax):    0.54 cm AV Area (Vmean):   0.61 cm AV Area (VTI):     0.58 cm AV Vmax:           492.00 cm/s AV Vmean:          312.000 cm/s AV VTI:            1.280 m AV Peak Grad:      96.8 mmHg AV Mean Grad:      48.0 mmHg  LVOT Vmax:         84.60 cm/s LVOT Vmean:        60.900 cm/s LVOT VTI:          0.237 m LVOT/AV VTI ratio: 0.19  AORTA Ao Sinus diam: 2.44 cm Ao Asc diam:   3.00 cm  MITRAL VALVE MV Area (PHT): 2.20 cm     SHUNTS MV Decel Time: 345 msec     Systemic VTI:  0.24 m MV E velocity: 81.30 cm/s   Systemic Diam: 2.00 cm MV A velocity: 120.00 cm/s MV E/A ratio:  0.68  Redell Cave MD Electronically signed by Redell Cave MD Signature Date/Time: 03/05/2024/4:38:31 PM    Final      CT SCANS  CT CORONARY MORPH W/CTA COR W/SCORE 04/27/2024  Addendum 04/28/2024  5:18 PM ADDENDUM REPORT: 04/28/2024 17:15  EXAM: OVER-READ INTERPRETATION CT CHEST  The following report is an over-read performed by radiologist Dr. Rogelia Hammans [facility] on 04/28/2024. This over-read does not include interpretation of cardiac or coronary anatomy or pathology. The coronary CTA interpretation by the cardiologist is  attached.  COMPARISON:  None Available.  FINDINGS: Pulmonary Embolism: No pulmonary embolism.  Cardiovascular: Normal appearance of extracardiac vascular structures. No pericardial effusion. No aortic aneurysm. Scattered aortic atherosclerosis.  Mediastinum/Nodes: No mediastinal mass. No mediastinal, hilar, or axillary lymphadenopathy. Normal esophagus.  Lungs/Pleura: The midline trachea and bronchi are patent. No focal airspace consolidation, pleural effusion, or pneumothorax. 4 mm nodule in the lingula (axial 19). 3.5 mm anterior basal left lower lobe nodule (axial 41). 3 mm lateral basal right lower lobe nodule (axial 28). 3 mm nodule in the posterior right middle lobe (axial 22).  Musculoskeletal: No acute fracture or destructive bone lesion. Multilevel degenerative disc disease of the spine.  Upper Abdomen: No acute abnormality in the partially visualized upper abdomen.  IMPRESSION: 1. No acute intrathoracic abnormality; specifically, no pneumonia, pulmonary edema, or pleural effusion. 2. Multiple small bilateral pulmonary nodules in the lung bases measuring up to 4 mm. No follow-up needed if patient is low-risk (and has no known or suspected primary neoplasm). Non-contrast chest CT can be considered in 12 months if patient is high-risk. This recommendation follows the consensus statement: Guidelines for Management of Incidental Pulmonary Nodules Detected on CT Images: From the Fleischner Society 2017; Radiology 2017; 284:228-243.   Electronically Signed By: Rogelia Myers M.D. On: 04/28/2024 17:15  Narrative MEDICATIONS: MEDICATIONS None  CLINICAL DATA:  Aortic stenosis  EXAM: Cardiac TAVR CT  TECHNIQUE: The patient was scanned on a GE apex scanner. 1 beat acquisition triggered in the descending thoracic aorta at 110 HU's. A non contrast, gated CT scan was obtained first with axial slices of 2.5 mm through the heart for valve scoring. A 120 kV  retrospective, gated, contrast scan done with gantry rotation speed of 230 msec and collimation 0.63 mm. A delayed scan was obtained to exclude LAA thrombus. The 3D data set was reconstructed in 5% intervals of the R-R cycle. Best systolic phase was motion corrected Images were analyzed on a dedicated workstation using MPR, MIP and VRT modes The patient received 100 cc of contrast  FINDINGS: Aortic Valve: Bicuspid with raphe between right/left cusps Calcium  score 1920 Bulk of calcium  is in non coronary leaflet with mild calcium  in raphe.  Aorta: No coarctation/aneurysm. Normal arch vessels Mild calcific atherosclerosis  Sinotubular Junction: 26.5 mm  Ascending Thoracic Aorta: 32 mm  Aortic Arch: 26 mm  Descending Thoracic Aorta: 23 mm  Sinus of Valsalva Measurements: Commissural distance 24 mm  Non-coronary: 30 mm Height 19.6 mm  Right - coronary: 28.6 mm Height 17.6 mm  Left - coronary: 28.9 mm Height 16.6 mm  Coronary Artery Height above Annulus:  Left Main: 13.1 mm above annulus  Right Coronary: 14.7 mm above annulus  Virtual Basal Annulus Measurements:  Maximum/Minimum Diameter: 26.4 mm x 18.5 mm Average diameter 22.4 mm  Perimeter: 72.9 mm  Area: 395 mm2  Coronary Arteries: Sufficient height above annulus for deployment  Optimum Fluoroscopic Angle for Delivery: LAO 0 Caudal 13 degrees  Membranous septal length 6.5 mm  IMPRESSION: 1. Sievers type one bicuspid aortic valve. Mildly calcified raphe between left/right cusps Score 1920  2. Annular area of 395 mm 2 suitable for a 23 mm Sapien valve For [employer] valve perimeter and sinuses borderline for 29 Evolute FX  3.  Coronary arteries sufficient height for deployment  4.  Membranous septal length 6.5 mm  5. Optimum angiographic angle for deployment LAO 0 Caudal 13 degrees  6. In regard to SAVR vs TAVR no ascending thoracic aneurysm or coarctation  Shari Lee  Electronically Signed: By:  Shari Lee M.D. On: 04/27/2024 11:38     ______________________________________________________________________________________________      ECG sinus    I have independently reviewed the above radiologic studies and discussed with the patient   Recent Lab Findings: Lab Results  Component Value Date   WBC 12.9 (H) 03/13/2024   HGB 12.9 03/26/2024   HCT 38.0 03/26/2024   PLT 358 03/13/2024   GLUCOSE 204 (H) 04/10/2024   CHOL 189 01/24/2024   TRIG 119.0 01/24/2024   HDL 55.40 01/24/2024   LDLCALC 110 (H) 01/24/2024   ALT 19 11/04/2023   AST 18 11/04/2023   NA 138 04/10/2024   K 4.8 04/10/2024   CL 100 04/10/2024   CREATININE 0.96 04/10/2024   BUN 17 04/10/2024   CO2 21 04/10/2024   TSH 2.900 03/21/2023   HGBA1C 9.0 (H) 01/24/2024      Assessment / Plan:   68 y.o. female with severe aortic stenosis.  STS score: 1.99.  NYHA Class II.  She has a bicuspid aortic valve, and no ascending aortic aneurysm.  She sized to a 23mm valve, and based off of her BSA this should be sufficient without a root enlargement.  The risks and benefits of a bioprosthetic AVR were discussed in detail.  The patient is agreeable to proceed.        I  spent 20 minutes counseling the patient face to face.   Shari Lee 06/11/2024 4:17 PM

## 2024-06-12 ENCOUNTER — Other Ambulatory Visit: Payer: Self-pay | Admitting: Pharmacist

## 2024-06-12 NOTE — Progress Notes (Signed)
 06/12/2024 Name: Shari Lee MRN: [id number] DOB: 1956/01/18  Chief Complaint  Patient presents with   Diabetes    Shari Lee is a 68 y.o. year old female who presented for a telephone visit.   They were referred to the pharmacist by their PCP for assistance in managing diabetes and medication access.    Subjective:   Medication Access/Adherence  Current Pharmacy:  [facility] [address] ([address]), [place] - [address] [place]) [place] [address] Phone: [phone] Fax: [phone]  [facility] [place], [place] - [address] [place] [address] Phone: [phone] Fax: [phone]   Patient reports affordability concerns with their medications: Yes  Patient reports access/transportation concerns to their pharmacy: No  Patient reports adherence concerns with their medications:  Yes      Diabetes:  Current medications:  [name] 23 units twice a day  - approved to get thru [employer] thru 10/17/2024 Novolog  / insulin  aspart 6 units with meals (started short acting Friday 05/18/2024) - received first delivery of Novolog  / insulin  aspart in our office. Patient plans to pick up 06/15/2024 Farxiga  10mg  daily - getting from [facility] and [facility] patient assistance program - approved thru 10/17/2024. Sent updated prescription to program 05/07/24   She was previously following a very low CHO but high meat / protein diet. Blood glucose was really well controlled but she found this type of diet was hard to sustain. She reports she tired of eating so much meat. She has been trying to limit her carbohydrate intake.    Medications tried in the past: Ozempic  - stopped due to pancreatitis; metformin  - worsened IBS related diarrhea   Date of Download: 05/30/2024 to 06/12/2024 % Time CGM is active: 95%  Average Glucose: 183 mg/dL (previously 769 mg/dL) Glucose Management Indicator: 7.7% (preciously was 8.8%) Glucose  Variability: 32.8% (goal <36%)  Time in Goal:  - Time in range 70-180: 55% (previously 33%) - Time in high range 181 to 250: 29% - Time in very high range > 250: 16% (previously was 39%) - Time below range: 0% ( previously 1%)  Blood glucose trends - Average blood glucose is trending down since short actin insulin  was added to evening meal. Patient has a reading of 87 recently. She states she felt a little shaky and treated like low blood glucose event.         Wt Readings from Last 3 Encounters:  05/17/24 203 lb 3.2 oz (92.2 kg)  04/10/24 198 lb (89.8 kg)  04/05/24 199 lb (90.3 kg)   Hyperlipidemia/ASCVD Risk Reduction  Current lipid lowering medications: ezetimibe  10mg  daily Medications tried in the past: rosuvastatin  - patient stop because she felt It was affecting her memory.   Objective:  Lab Results  Component Value Date   HGBA1C 9.0 (H) 01/24/2024   BP Readings from Last 3 Encounters:  05/17/24 (!) 150/81  04/10/24 117/74  04/05/24 (!) 180/78     Lab Results  Component Value Date   CREATININE 0.96 04/10/2024   BUN 17 04/10/2024   NA 138 04/10/2024   K 4.8 04/10/2024   CL 100 04/10/2024   CO2 21 04/10/2024    Lab Results  Component Value Date   CHOL 189 01/24/2024   HDL 55.40 01/24/2024   LDLCALC 110 (H) 01/24/2024   TRIG 119.0 01/24/2024   CHOLHDL 3 01/24/2024    Medications Reviewed Today   Medications were not reviewed  in this encounter       Assessment/Plan:  Diabetes: blood glucose and GMI over the last 2 weeks has not been at goal but blood glucose is trending down since starting short acting insulin  - Decrease [name] to 23 units in the morning and 20 units in the evening (would like to eventually change over to just once a day dosing) - Increase Novolog  / insulin  aspart to 7 units with each meal.  - Continue Farxiga  10mg  daily.  - Continue to use Libre 3 sensors to check blood glucose continuously.    Follow Up Plan: 2 to 3 weeks    Shari Lee, PharmD Clinical Pharmacist [facility]  Population Health [phone]

## 2024-06-13 ENCOUNTER — Other Ambulatory Visit: Payer: Self-pay | Admitting: Physician Assistant

## 2024-06-13 DIAGNOSIS — I10 Essential (primary) hypertension: Secondary | ICD-10-CM

## 2024-06-13 DIAGNOSIS — E1159 Type 2 diabetes mellitus with other circulatory complications: Secondary | ICD-10-CM

## 2024-06-15 ENCOUNTER — Encounter: Payer: Self-pay | Admitting: Thoracic Surgery (Cardiothoracic Vascular Surgery)

## 2024-06-15 ENCOUNTER — Ambulatory Visit
Attending: Thoracic Surgery (Cardiothoracic Vascular Surgery) | Admitting: Thoracic Surgery (Cardiothoracic Vascular Surgery)

## 2024-06-15 VITALS — BP 157/81 | HR 57 | Ht 62.0 in | Wt 204.6 lb

## 2024-06-15 DIAGNOSIS — I350 Nonrheumatic aortic (valve) stenosis: Secondary | ICD-10-CM

## 2024-06-25 ENCOUNTER — Encounter (HOSPITAL_COMMUNITY)

## 2024-06-25 ENCOUNTER — Other Ambulatory Visit (HOSPITAL_COMMUNITY)

## 2024-06-28 ENCOUNTER — Other Ambulatory Visit: Payer: Self-pay | Admitting: Pharmacist

## 2024-06-28 NOTE — Progress Notes (Signed)
 06/28/2024 Name: Shari Lee MRN: [id number] DOB: [birth year]  Chief Complaint  Patient presents with   Diabetes    Shari Lee is a 68 y.o. year old female who presented for a telephone visit.   They were referred to the pharmacist by their PCP for assistance in managing diabetes and medication access.    Subjective:   Medication Access/Adherence  Current Pharmacy:  [facility] [place] [address] ([address]), [place] - [address] [place]) [place] [address] Phone: [phone] Fax: [phone]  [facility] [place], [place] - [address] [place] [address] Phone: [phone] Fax: [phone]   Patient reports affordability concerns with their medications: Yes  Patient reports access/transportation concerns to their pharmacy: No  Patient reports adherence concerns with their medications:  Yes      Diabetes:  Current medications:  [name] 25 units twice a day  - approved to get thru [employer] thru 10/17/2024 Novolog  / insulin  aspart 9 units with meals (started short acting Friday 05/18/2024) - received first delivery of Novolog  / insulin  aspart in our office 05/2024. Farxiga  10mg  daily - getting from [facility] and Me patient assistance program - approved thru 10/17/2024. Sent updated prescription to program 05/07/24   She was previously following a very low CHO but high meat / protein diet. Blood glucose was really well controlled but she found this type of diet was hard to sustain. She reports she tired of eating so much meat. She has been trying to limit her carbohydrate intake.    Medications tried in the past: Ozempic  - stopped due to pancreatitis; metformin  - worsened IBS related diarrhea   Date of Download: 06/15/2024 to 06/28/2024 % Time CGM is active: 97%  Average Glucose: 175 mg/dL (previously 769 mg/dL) Glucose Management Indicator: 7.5% (preciously was 8.8%) Glucose Variability: 28% (goal <36%)   Time in Goal:  - Time in range 70-180: 61% (previously 33%) - Time in high range 181 to 250: 32% - Time in very high range > 250: 7% (previously was 39%) - Time below range: 0% ( previously 1%)  Blood glucose trends - Average blood glucose is trending down since short acting insulin  was added to meals. Patient denies any low blood glucose events and none showed over the last 2 weeks on her Continuous Glucose Monitor report. Stoll having postprandial highs.       Wt Readings from Last 3 Encounters:  06/15/24 204 lb 9.6 oz (92.8 kg)  05/17/24 203 lb 3.2 oz (92.2 kg)  04/10/24 198 lb (89.8 kg)   Hyperlipidemia/ASCVD Risk Reduction  Current lipid lowering medications: ezetimibe  10mg  daily Medications tried in the past: rosuvastatin  - patient stop because she felt It was affecting her memory.   Objective:  Lab Results  Component Value Date   HGBA1C 9.0 (H) 01/24/2024   BP Readings from Last 3 Encounters:  06/15/24 (!) 157/81  05/17/24 (!) 150/81  04/10/24 117/74     Lab Results  Component Value Date   CREATININE 0.96 04/10/2024   BUN 17 04/10/2024   NA 138 04/10/2024   K 4.8 04/10/2024   CL 100 04/10/2024   CO2 21 04/10/2024    Lab Results  Component Value Date   CHOL 189 01/24/2024   HDL 55.40 01/24/2024   LDLCALC 110 (H) 01/24/2024   TRIG 119.0 01/24/2024   CHOLHDL 3 01/24/2024    Medications Reviewed Today     Reviewed by Carla Milling,  RPH-CPP (Pharmacist) on 06/28/24 at 1114  Med List Status: <None>   Medication Order Taking? Sig Documenting Provider Last Dose Status Informant  albuterol  (VENTOLIN  HFA) 108 (90 Base) MCG/ACT inhaler [id number]  Inhale 2 puffs into the lungs every 6 (six) hours as needed for wheezing or shortness of breath. Emilio Marseille T, FNP  Active Self  amLODipine -benazepril  (LOTREL) 5-20 MG capsule [id number]  Take 1 capsule by mouth once daily Drubel, Manuelita, PA-C  Active   atenolol  (TENORMIN ) 50 MG tablet [id number]  Take 1 tablet  by mouth once daily Drubel, Manuelita, PA-C  Active   Blood Glucose Monitoring Suppl (CONTOUR NEXT USB MONITOR) w/Device KIT [id number]  1 kit by Does not apply route daily. To check blood sugar once daily. Vivienne Delon HERO, PA-C  Active Self  CareTouch Safety Lancets 26G MISC [id number]  1 Device by Does not apply route daily. To check blood sugar once daily. Vivienne Delon HERO, PA-C  Active Self  cetirizine  (ZYRTEC ) 10 MG tablet [id number]  Take 1 tablet by mouth once daily Drubel, Manuelita, PA-C  Active   chlorhexidine (PERIDEX) 0.12 % solution [id number]   [provider]  Active   Continuous Glucose Sensor (FREESTYLE LIBRE 3 PLUS SENSOR) MISC [id number]  USE AS DIRECTED. CHANGE SENSOR EVERY 15 DAYS. Cyndi Manuelita, PA-C  Active Self  dapagliflozin  propanediol (FARXIGA ) 10 MG TABS tablet [id number]  Take 1 tablet (10 mg total) by mouth daily. For 2025 [place] Patient Assistance Program Cyndi Manuelita, PA-C  Active   doxycycline  (MONODOX ) 50 MG capsule [id number]  Take 50 mg by mouth 2 (two) times daily. [provider]  Active   ezetimibe  (ZETIA ) 10 MG tablet [id number]  Take 1 tablet (10 mg total) by mouth daily. Cyndi Manuelita, PA-C  Active   glucose blood (CONTOUR NEXT TEST) test strip [id number]  To check blood sugar once daily. Vivienne Delon HERO, PA-C  Active Self  insulin  aspart (NOVOLOG ) 100 UNIT/ML FlexPen [id number] Yes Inject 4 to 10 units up to 3 times a day with a meal.  Patient taking differently: 9 Units 3 (three) times daily with meals.   Cyndi Manuelita, PA-C  Active   insulin  degludec ([name] FLEXTOUCH) 100 UNIT/ML FlexTouch Pen [id number] Yes Inject 23 Units into the skin 2 (two) times daily.  Patient taking differently: Inject 25 Units into the skin 2 (two) times daily.   [provider]  Active Self           Med Note ELSWORTH, JACQUELINE L   Wed Feb 01, 2024  2:55 PM)    Insulin  Pen Needle (NOVOFINE PLUS) 32G X 4 MM MISC [id number]  To use with  ozempic  injections Burnette, Jennifer M, PA-C  Active Self  montelukast  (SINGULAIR ) 10 MG tablet [id number]  TAKE 1 TABLET BY MOUTH AT BEDTIME Drubel, Manuelita, PA-C  Active   omeprazole (PRILOSEC OTC) 20 MG tablet [id number]  Take 20 mg by mouth daily. [provider]  Active Self              Assessment/Plan:  Diabetes: blood glucose and GMI over the last 2 weeks has not been at goal but blood glucose is trending down since starting short acting insulin  - Continue [name] to 25 units in the morning and in the evening (would like to eventually change over to just once a day dosing) - Increase Novolog  / insulin  aspart to 10 units with each meal.  - Continue Farxiga  10mg  daily.  - Continue to use Libre 3 sensors to  check blood glucose continuously.    Follow Up Plan: 2 to 3 weeks   [name], PharmD Clinical Pharmacist [facility] Primary Care  Population Health [phone]

## 2024-07-02 NOTE — Progress Notes (Signed)
 Surgical Instructions   Your procedure is scheduled on Thursday, September 18th. Report to [facility] Main Entrance A at 5:30 A.M., then check in with the Admitting office. Any questions or running late day of surgery: call [phone]  Questions prior to your surgery date: call [phone], Monday-Friday, 8am-4pm. If you experience any cold or flu symptoms such as cough, fever, chills, shortness of breath, etc. between now and your scheduled surgery, please notify us  at the above number.     Remember:  Do not eat or drink after midnight the night before your surgery   Take these medicines the morning of surgery with A SIP OF WATER   atenolol  (TENORMIN )  cetirizine  (ZYRTEC )  chlorhexidine  (PERIDEX )  ezetimibe  (ZETIA )  omeprazole (PRILOSEC OTC)   May take these medicines IF NEEDED: albuterol  (VENTOLIN  HFA) inhaler - bring with you on day of surgery    Per your physician's instruction's HOLD your dapagliflozin  propanediol (FARXIGA ) for 72 hours prior to surgery.  Last dose should be taken on Sunday, September 14th.   One week prior to surgery, STOP taking any Aspirin  (unless otherwise instructed by your surgeon) Aleve, Naproxen, Ibuprofen, Motrin, Advil, Goody's, BC's, all herbal medications, fish oil, and non-prescription vitamins.          WHAT DO I DO ABOUT MY DIABETES MEDICATION?   Do not take oral diabetes medicines (pills) the morning of surgery.  THE NIGHT BEFORE SURGERY, take 12.5 units (50%) of insulin  degludec ([name] FLEXTOUCH).        THE MORNING OF SURGERY, take 12.5 units (50%) of insulin  degludec ([name] FLEXTOUCH).   The day of surgery, do not take other diabetes injectables, including Byetta (exenatide), Bydureon (exenatide ER), Victoza (liraglutide), or Trulicity (dulaglutide).  If your CBG is greater than 220 mg/dL, you may take  of your sliding scale (insulin  aspart (NOVOLOG )  dose of insulin .   HOW TO MANAGE YOUR DIABETES BEFORE AND AFTER  SURGERY  Why is it important to control my blood sugar before and after surgery? Improving blood sugar levels before and after surgery helps healing and can limit problems. A way of improving blood sugar control is eating a healthy diet by:  Eating less sugar and carbohydrates  Increasing activity/exercise  Talking with your doctor about reaching your blood sugar goals High blood sugars (greater than 180 mg/dL) can raise your risk of infections and slow your recovery, so you will need to focus on controlling your diabetes during the weeks before surgery. Make sure that the doctor who takes care of your diabetes knows about your planned surgery including the date and location.  How do I manage my blood sugar before surgery? Check your blood sugar at least 4 times a day, starting 2 days before surgery, to make sure that the level is not too high or low.  Check your blood sugar the morning of your surgery when you wake up and every 2 hours until you get to the Short Stay unit.  If your blood sugar is less than 70 mg/dL, you will need to treat for low blood sugar: Do not take insulin . Treat a low blood sugar (less than 70 mg/dL) with  cup of clear juice (cranberry or apple), 4 glucose tablets, OR glucose gel. Recheck blood sugar in 15 minutes after treatment (to make sure it is greater than 70 mg/dL). If your blood sugar is not greater than 70 mg/dL on recheck, call [phone] for further instructions. Report your blood sugar to the short stay nurse when you  get to Short Stay.  If you are admitted to the hospital after surgery: Your blood sugar will be checked by the staff and you will probably be given insulin  after surgery (instead of oral diabetes medicines) to make sure you have good blood sugar levels. The goal for blood sugar control after surgery is 80-180 mg/dL.              Do NOT Smoke (Tobacco/Vaping) for 24 hours prior to your procedure.  If you use a CPAP at night, you may  bring your mask/headgear for your overnight stay.   You will be asked to remove any contacts, glasses, piercing's, hearing aid's, dentures/partials prior to surgery. Please bring cases for these items if needed.    Patients discharged the day of surgery will not be allowed to drive home, and someone needs to stay with them for 24 hours.  SURGICAL WAITING ROOM VISITATION Patients may have no more than 2 support people in the waiting area - these visitors may rotate.   Pre-op nurse will coordinate an appropriate time for 1 ADULT support person, who may not rotate, to accompany patient in pre-op.  Children under the age of 18 must have an adult with them who is not the patient and must remain in the main waiting area with an adult.  If the patient needs to stay at the hospital during part of their recovery, the visitor guidelines for inpatient rooms apply.  Please refer to the [facility] website for the visitor guidelines for any additional information.   If you received a COVID test during your pre-op visit  it is requested that you wear a mask when out in public, stay away from anyone that may not be feeling well and notify your surgeon if you develop symptoms. If you have been in contact with anyone that has tested positive in the last 10 days please notify you surgeon.      Pre-operative CHG Bathing Instructions   You can play a key role in reducing the risk of infection after surgery. Your skin needs to be as free of germs as possible. You can reduce the number of germs on your skin by washing with CHG (chlorhexidine  gluconate) soap before surgery. CHG is an antiseptic soap that kills germs and continues to kill germs even after washing.   DO NOT use if you have an allergy to chlorhexidine /CHG or antibacterial soaps. If your skin becomes reddened or irritated, stop using the CHG and notify one of our RNs at [phone].              TAKE A SHOWER THE NIGHT BEFORE SURGERY AND THE DAY OF  SURGERY    Please keep in mind the following:  DO NOT shave, including legs and underarms, 48 hours prior to surgery.   You may shave your face before/day of surgery.  Place clean sheets on your bed the night before surgery Use a clean washcloth (not used since being washed) for each shower. DO NOT sleep with pet's night before surgery.  CHG Shower Instructions:  Wash your face and private area with normal soap. If you choose to wash your hair, wash first with your normal shampoo.  After you use shampoo/soap, rinse your hair and body thoroughly to remove shampoo/soap residue.  Turn the water  OFF and apply half the bottle of CHG soap to a CLEAN washcloth.  Apply CHG soap ONLY FROM YOUR NECK DOWN TO YOUR TOES (washing for 3-5 minutes)  DO NOT  use CHG soap on face, private areas, open wounds, or sores.  Pay special attention to the area where your surgery is being performed.  If you are having back surgery, having someone wash your back for you may be helpful. Wait 2 minutes after CHG soap is applied, then you may rinse off the CHG soap.  Pat dry with a clean towel  Put on clean pajamas    Additional instructions for the day of surgery: DO NOT APPLY any lotions, deodorants, cologne, or perfumes.   Do not wear jewelry or makeup Do not wear nail polish, gel polish, artificial nails, or any other type of covering on natural nails (fingers and toes) Do not bring valuables to the hospital. [facility] is not responsible for valuables/personal belongings. Put on clean/comfortable clothes.  Please brush your teeth.  Ask your nurse before applying any prescription medications to the skin.

## 2024-07-03 ENCOUNTER — Other Ambulatory Visit: Payer: Self-pay

## 2024-07-03 ENCOUNTER — Encounter (HOSPITAL_COMMUNITY)
Admission: RE | Admit: 2024-07-03 | Discharge: 2024-07-03 | Disposition: A | Discharge status: Home / Self Care | Source: Ambulatory Visit | Attending: Thoracic Surgery (Cardiothoracic Vascular Surgery) | Admitting: Thoracic Surgery (Cardiothoracic Vascular Surgery)

## 2024-07-03 ENCOUNTER — Ambulatory Visit (HOSPITAL_BASED_OUTPATIENT_CLINIC_OR_DEPARTMENT_OTHER)
Admission: RE | Admit: 2024-07-03 | Discharge: 2024-07-03 | Disposition: A | Discharge status: Home / Self Care | Source: Ambulatory Visit | Attending: Thoracic Surgery (Cardiothoracic Vascular Surgery) | Admitting: Thoracic Surgery (Cardiothoracic Vascular Surgery)

## 2024-07-03 ENCOUNTER — Encounter (HOSPITAL_COMMUNITY): Payer: Self-pay

## 2024-07-03 ENCOUNTER — Ambulatory Visit (HOSPITAL_COMMUNITY)
Admission: RE | Admit: 2024-07-03 | Discharge: 2024-07-03 | Disposition: A | Discharge status: Home / Self Care | Source: Ambulatory Visit | Attending: Thoracic Surgery (Cardiothoracic Vascular Surgery)

## 2024-07-03 VITALS — BP 150/66 | HR 64 | Temp 98.0°F | Resp 17 | Ht 62.0 in | Wt 205.9 lb

## 2024-07-03 DIAGNOSIS — Z01818 Encounter for other preprocedural examination: Secondary | ICD-10-CM

## 2024-07-03 DIAGNOSIS — I350 Nonrheumatic aortic (valve) stenosis: Secondary | ICD-10-CM | POA: Insufficient documentation

## 2024-07-03 HISTORY — DX: Sleep apnea, unspecified: G47.30

## 2024-07-03 LAB — COMPREHENSIVE METABOLIC PANEL WITH GFR
ALT: 16 U/L (ref 0–44)
AST: 20 U/L (ref 15–41)
Albumin: 3.6 g/dL (ref 3.5–5.0)
Alkaline Phosphatase: 75 U/L (ref 38–126)
Anion gap: 9 (ref 5–15)
BUN: 16 mg/dL (ref 8–23)
CO2: 21 mmol/L — ABNORMAL LOW (ref 22–32)
Calcium: 9.1 mg/dL (ref 8.9–10.3)
Chloride: 107 mmol/L (ref 98–111)
Creatinine, Ser: 0.9 mg/dL (ref 0.44–1.00)
GFR, Estimated: >60 mL/min (ref >60)
Glucose, Bld: 172 mg/dL — ABNORMAL HIGH (ref 70–99)
Potassium: 4.3 mmol/L (ref 3.5–5.1)
Sodium: 137 mmol/L (ref 135–145)
Total Bilirubin: <0.2 mg/dL (ref 0.0–1.2)
Total Protein: 7 g/dL (ref 6.5–8.1)

## 2024-07-03 LAB — CBC
HCT: 40.7 % (ref 36.0–46.0)
Hemoglobin: 12.9 g/dL (ref 12.0–15.0)
MCH: 27.8 pg (ref 26.0–34.0)
MCHC: 31.7 g/dL (ref 30.0–36.0)
MCV: 87.7 fL (ref 80.0–100.0)
Platelets: 362 K/uL (ref 150–400)
RBC: 4.64 MIL/uL (ref 3.87–5.11)
RDW: 14.1 % (ref 11.5–15.5)
WBC: 10 K/uL (ref 4.0–10.5)
nRBC: 0 % (ref 0.0–0.2)

## 2024-07-03 LAB — URINALYSIS, ROUTINE W REFLEX MICROSCOPIC
Bacteria, UA: NONE SEEN
Bilirubin Urine: NEGATIVE
Glucose, UA: >=500 mg/dL — AB
Hgb urine dipstick: NEGATIVE
Ketones, ur: NEGATIVE mg/dL
Leukocytes,Ua: NEGATIVE
Nitrite: NEGATIVE
Protein, ur: NEGATIVE mg/dL
Specific Gravity, Urine: 1.015 (ref 1.005–1.030)
pH: 5 (ref 5.0–8.0)

## 2024-07-03 LAB — PROTIME-INR
INR: 1 (ref 0.8–1.2)
Prothrombin Time: 13.5 s (ref 11.4–15.2)

## 2024-07-03 LAB — GLUCOSE, CAPILLARY: Glucose-Capillary: 178 mg/dL — ABNORMAL HIGH (ref 70–99)

## 2024-07-03 LAB — TYPE AND SCREEN
ABO/RH(D): A POS
Antibody Screen: NEGATIVE

## 2024-07-03 LAB — HEMOGLOBIN A1C
Hgb A1c MFr Bld: 8.9 % — ABNORMAL HIGH (ref 4.8–5.6)
Mean Plasma Glucose: 208.73 mg/dL

## 2024-07-03 LAB — APTT: aPTT: 29 s (ref 24–36)

## 2024-07-03 LAB — SURGICAL PCR SCREEN
MRSA, PCR: NEGATIVE
Staphylococcus aureus: NEGATIVE

## 2024-07-03 NOTE — Progress Notes (Signed)
 PCP - Manuelita Flatness  Cardiologist - Redell Agbor-Etang LOV 04-10-24  PPM/ICD - Denies Device Orders - n/a Rep Notified - n/a  Chest x-ray - 07-03-24 EKG - 07-03-24 Stress Test - Denies ECHO - 03-05-24 Cardiac Cath - 03-26-24  Sleep Study - 05-15-22 CPAP - per patient has one but does not wear as she cannot tolerate it  Fasting Blood Sugar - 100-130 Checks Blood Sugar:  per patient wears a Libre 3 on left arm that is due to be changed on 9-22  Last dose of GLP1 agonist-  Denies GLP1 instructions: n/a  Blood Thinner Instructions:n/a Aspirin  Instructions: Denies Farxiga  - hold for 3 day's; per patient last dose on 9-13  ERAS Protcol - NPO PRE-SURGERY Ensure or G2- none  COVID TEST- n/a   Anesthesia review: Yes, HTN, DM2, Asthma,   Patient denies shortness of breath, fever, cough and chest pain at PAT appointment. Patient denies any respiratory issues at this time.    All instructions explained to the patient, with a verbal understanding of the material. Patient agrees to go over the instructions while at home for a better understanding. Patient also instructed to self quarantine after being tested for COVID-19. The opportunity to ask questions was provided.

## 2024-07-04 MED ORDER — POTASSIUM CHLORIDE 2 MEQ/ML IV SOLN
80.0000 meq | INTRAVENOUS | Status: DC
  Filled 2024-07-04: qty 40

## 2024-07-04 MED ORDER — MILRINONE LACTATE IN DEXTROSE 20-5 MG/100ML-% IV SOLN
0.3000 ug/kg/min | INTRAVENOUS | Status: DC
  Filled 2024-07-04: qty 100

## 2024-07-04 MED ORDER — TRANEXAMIC ACID (OHS) BOLUS VIA INFUSION
15.0000 mg/kg | INTRAVENOUS | Status: AC
  Administered 2024-07-05: 1401 mg via INTRAVENOUS
  Filled 2024-07-04: qty 1401

## 2024-07-04 MED ORDER — PHENYLEPHRINE HCL-NACL 20-0.9 MG/250ML-% IV SOLN
30.0000 ug/min | INTRAVENOUS | Status: AC
Start: 2024-07-05 — End: 2024-07-06
  Administered 2024-07-05: 15 ug/min via INTRAVENOUS
  Filled 2024-07-04: qty 250

## 2024-07-04 MED ORDER — VANCOMYCIN HCL 1.5 G IV SOLR
1500.0000 mg | INTRAVENOUS | Status: AC
  Administered 2024-07-05: 1500 mg via INTRAVENOUS
  Filled 2024-07-04: qty 30

## 2024-07-04 MED ORDER — CEFAZOLIN SODIUM-DEXTROSE 2-4 GM/100ML-% IV SOLN
2.0000 g | INTRAVENOUS | Status: DC
  Filled 2024-07-04: qty 100

## 2024-07-04 MED ORDER — MANNITOL 20 % IV SOLN
INTRAVENOUS | Status: DC
  Filled 2024-07-04: qty 13

## 2024-07-04 MED ORDER — EPINEPHRINE HCL 5 MG/250ML IV SOLN IN NS
0.0000 ug/min | INTRAVENOUS | Status: DC
  Filled 2024-07-04: qty 250

## 2024-07-04 MED ORDER — NITROGLYCERIN IN D5W 200-5 MCG/ML-% IV SOLN
2.0000 ug/min | INTRAVENOUS | Status: DC
Start: 2024-07-05 — End: 2024-07-06
  Filled 2024-07-04: qty 250

## 2024-07-04 MED ORDER — TRANEXAMIC ACID 1000 MG/10ML IV SOLN
1.5000 mg/kg/h | INTRAVENOUS | Status: AC
  Administered 2024-07-05: 1.5 mg/kg/h via INTRAVENOUS
  Filled 2024-07-04: qty 25

## 2024-07-04 MED ORDER — NOREPINEPHRINE 4 MG/250ML-% IV SOLN
0.0000 ug/min | INTRAVENOUS | Status: DC
  Filled 2024-07-04: qty 250

## 2024-07-04 MED ORDER — DEXMEDETOMIDINE HCL IN NACL 400 MCG/100ML IV SOLN
0.1000 ug/kg/h | INTRAVENOUS | Status: AC
Start: 2024-07-05 — End: 2024-07-06
  Administered 2024-07-05: .7 ug/kg/h via INTRAVENOUS
  Filled 2024-07-04: qty 100

## 2024-07-04 MED ORDER — INSULIN REGULAR(HUMAN) IN NACL 100-0.9 UT/100ML-% IV SOLN
INTRAVENOUS | Status: AC
  Administered 2024-07-05: 1.5 [IU]/h via INTRAVENOUS
  Filled 2024-07-04: qty 100

## 2024-07-04 MED ORDER — HEPARIN 30,000 UNITS/1000 ML (OHS) CELLSAVER SOLUTION
Status: DC
  Filled 2024-07-04: qty 1000

## 2024-07-04 MED ORDER — PLASMA-LYTE A IV SOLN
INTRAVENOUS | Status: DC
  Filled 2024-07-04: qty 2.5

## 2024-07-04 MED ORDER — CEFAZOLIN SODIUM-DEXTROSE 2-4 GM/100ML-% IV SOLN
2.0000 g | INTRAVENOUS | Status: AC
  Administered 2024-07-05 (×2): 2 g via INTRAVENOUS
  Filled 2024-07-04: qty 100

## 2024-07-04 MED ORDER — TRANEXAMIC ACID (OHS) PUMP PRIME SOLUTION
2.0000 mg/kg | INTRAVENOUS | Status: DC
  Filled 2024-07-04: qty 1.87

## 2024-07-05 ENCOUNTER — Inpatient Hospital Stay (HOSPITAL_COMMUNITY)
Admission: RE | Disposition: A | Discharge status: Home / Self Care | Payer: Self-pay | Source: Home / Self Care | Attending: Thoracic Surgery (Cardiothoracic Vascular Surgery)

## 2024-07-05 ENCOUNTER — Inpatient Hospital Stay (HOSPITAL_COMMUNITY)

## 2024-07-05 ENCOUNTER — Inpatient Hospital Stay (HOSPITAL_COMMUNITY)
Admission: RE | Admit: 2024-07-05 | Discharge: 2024-07-05 | Disposition: A | Discharge status: Home / Self Care | Source: Home / Self Care | Attending: Thoracic Surgery (Cardiothoracic Vascular Surgery) | Admitting: Thoracic Surgery (Cardiothoracic Vascular Surgery)

## 2024-07-05 ENCOUNTER — Inpatient Hospital Stay (HOSPITAL_COMMUNITY): Admitting: Physician Assistant

## 2024-07-05 ENCOUNTER — Other Ambulatory Visit: Payer: Self-pay

## 2024-07-05 ENCOUNTER — Encounter (HOSPITAL_COMMUNITY): Payer: Self-pay | Admitting: Thoracic Surgery (Cardiothoracic Vascular Surgery)

## 2024-07-05 ENCOUNTER — Inpatient Hospital Stay (HOSPITAL_COMMUNITY): Admitting: Certified Registered"

## 2024-07-05 ENCOUNTER — Inpatient Hospital Stay (HOSPITAL_COMMUNITY)
Admission: RE | Admit: 2024-07-05 | Discharge: 2024-07-11 | DRG: 220 | Disposition: A | Discharge status: Home / Self Care | Attending: Thoracic Surgery (Cardiothoracic Vascular Surgery) | Admitting: Thoracic Surgery (Cardiothoracic Vascular Surgery)

## 2024-07-05 DIAGNOSIS — G4733 Obstructive sleep apnea (adult) (pediatric): Secondary | ICD-10-CM | POA: Diagnosis present

## 2024-07-05 DIAGNOSIS — I509 Heart failure, unspecified: Secondary | ICD-10-CM

## 2024-07-05 DIAGNOSIS — Z833 Family history of diabetes mellitus: Secondary | ICD-10-CM | POA: Diagnosis not present

## 2024-07-05 DIAGNOSIS — Z90710 Acquired absence of both cervix and uterus: Secondary | ICD-10-CM

## 2024-07-05 DIAGNOSIS — E1165 Type 2 diabetes mellitus with hyperglycemia: Secondary | ICD-10-CM

## 2024-07-05 DIAGNOSIS — Z803 Family history of malignant neoplasm of breast: Secondary | ICD-10-CM | POA: Diagnosis not present

## 2024-07-05 DIAGNOSIS — E119 Type 2 diabetes mellitus without complications: Secondary | ICD-10-CM

## 2024-07-05 DIAGNOSIS — D62 Acute posthemorrhagic anemia: Secondary | ICD-10-CM | POA: Diagnosis not present

## 2024-07-05 DIAGNOSIS — I152 Hypertension secondary to endocrine disorders: Secondary | ICD-10-CM

## 2024-07-05 DIAGNOSIS — Z794 Long term (current) use of insulin: Secondary | ICD-10-CM | POA: Diagnosis not present

## 2024-07-05 DIAGNOSIS — E669 Obesity, unspecified: Secondary | ICD-10-CM | POA: Diagnosis present

## 2024-07-05 DIAGNOSIS — I4891 Unspecified atrial fibrillation: Secondary | ICD-10-CM | POA: Diagnosis present

## 2024-07-05 DIAGNOSIS — Y848 Other medical procedures as the cause of abnormal reaction of the patient, or of later complication, without mention of misadventure at the time of the procedure: Secondary | ICD-10-CM | POA: Diagnosis not present

## 2024-07-05 DIAGNOSIS — E872 Acidosis, unspecified: Secondary | ICD-10-CM | POA: Diagnosis present

## 2024-07-05 DIAGNOSIS — I110 Hypertensive heart disease with heart failure: Secondary | ICD-10-CM | POA: Diagnosis not present

## 2024-07-05 DIAGNOSIS — D72829 Elevated white blood cell count, unspecified: Secondary | ICD-10-CM | POA: Diagnosis not present

## 2024-07-05 DIAGNOSIS — I808 Phlebitis and thrombophlebitis of other sites: Secondary | ICD-10-CM | POA: Diagnosis not present

## 2024-07-05 DIAGNOSIS — K219 Gastro-esophageal reflux disease without esophagitis: Secondary | ICD-10-CM | POA: Diagnosis present

## 2024-07-05 DIAGNOSIS — J45909 Unspecified asthma, uncomplicated: Secondary | ICD-10-CM | POA: Diagnosis present

## 2024-07-05 DIAGNOSIS — I350 Nonrheumatic aortic (valve) stenosis: Principal | ICD-10-CM | POA: Diagnosis present

## 2024-07-05 DIAGNOSIS — Z91013 Allergy to seafood: Secondary | ICD-10-CM

## 2024-07-05 DIAGNOSIS — Z952 Presence of prosthetic heart valve: Principal | ICD-10-CM

## 2024-07-05 DIAGNOSIS — I10 Essential (primary) hypertension: Secondary | ICD-10-CM | POA: Diagnosis present

## 2024-07-05 DIAGNOSIS — Z8249 Family history of ischemic heart disease and other diseases of the circulatory system: Secondary | ICD-10-CM

## 2024-07-05 DIAGNOSIS — L03113 Cellulitis of right upper limb: Secondary | ICD-10-CM | POA: Diagnosis not present

## 2024-07-05 DIAGNOSIS — Z7982 Long term (current) use of aspirin: Secondary | ICD-10-CM

## 2024-07-05 DIAGNOSIS — T801XXA Vascular complications following infusion, transfusion and therapeutic injection, initial encounter: Secondary | ICD-10-CM | POA: Diagnosis not present

## 2024-07-05 DIAGNOSIS — E1169 Type 2 diabetes mellitus with other specified complication: Secondary | ICD-10-CM | POA: Diagnosis present

## 2024-07-05 DIAGNOSIS — Z888 Allergy status to other drugs, medicaments and biological substances status: Secondary | ICD-10-CM

## 2024-07-05 DIAGNOSIS — Z6839 Body mass index (BMI) 39.0-39.9, adult: Secondary | ICD-10-CM

## 2024-07-05 DIAGNOSIS — H9190 Unspecified hearing loss, unspecified ear: Secondary | ICD-10-CM | POA: Diagnosis present

## 2024-07-05 DIAGNOSIS — E785 Hyperlipidemia, unspecified: Secondary | ICD-10-CM | POA: Diagnosis present

## 2024-07-05 DIAGNOSIS — Z79899 Other long term (current) drug therapy: Secondary | ICD-10-CM

## 2024-07-05 DIAGNOSIS — Z880 Allergy status to penicillin: Secondary | ICD-10-CM

## 2024-07-05 HISTORY — PX: AORTIC VALVE REPLACEMENT: SHX41

## 2024-07-05 HISTORY — PX: TEE WITHOUT CARDIOVERSION: SHX5443

## 2024-07-05 LAB — POCT I-STAT 7, (LYTES, BLD GAS, ICA,H+H)
Acid-base deficit: 4 mmol/L — ABNORMAL HIGH (ref 0.0–2.0)
Acid-base deficit: 2 mmol/L (ref 0.0–2.0)
Acid-base deficit: 1 mmol/L (ref 0.0–2.0)
Acid-base deficit: 9 mmol/L — ABNORMAL HIGH (ref 0.0–2.0)
Acid-base deficit: 7 mmol/L — ABNORMAL HIGH (ref 0.0–2.0)
Acid-base deficit: 5 mmol/L — ABNORMAL HIGH (ref 0.0–2.0)
Acid-base deficit: 7 mmol/L — ABNORMAL HIGH (ref 0.0–2.0)
Acid-base deficit: 7 mmol/L — ABNORMAL HIGH (ref 0.0–2.0)
Bicarbonate: 24.3 mmol/L (ref 20.0–28.0)
Bicarbonate: 22.4 mmol/L (ref 20.0–28.0)
Bicarbonate: 23.8 mmol/L (ref 20.0–28.0)
Bicarbonate: 17.4 mmol/L — ABNORMAL LOW (ref 20.0–28.0)
Bicarbonate: 20.8 mmol/L (ref 20.0–28.0)
Bicarbonate: 21.4 mmol/L (ref 20.0–28.0)
Bicarbonate: 19.4 mmol/L — ABNORMAL LOW (ref 20.0–28.0)
Bicarbonate: 19.3 mmol/L — ABNORMAL LOW (ref 20.0–28.0)
Calcium, Ion: 1.28 mmol/L (ref 1.15–1.40)
Calcium, Ion: 0.98 mmol/L — ABNORMAL LOW (ref 1.15–1.40)
Calcium, Ion: 1.11 mmol/L — ABNORMAL LOW (ref 1.15–1.40)
Calcium, Ion: 1.16 mmol/L (ref 1.15–1.40)
Calcium, Ion: 1.19 mmol/L (ref 1.15–1.40)
Calcium, Ion: 1.19 mmol/L (ref 1.15–1.40)
Calcium, Ion: 1.1 mmol/L — ABNORMAL LOW (ref 1.15–1.40)
Calcium, Ion: 1.15 mmol/L (ref 1.15–1.40)
HCT: 37 % (ref 36.0–46.0)
HCT: 25 % — ABNORMAL LOW (ref 36.0–46.0)
HCT: 27 % — ABNORMAL LOW (ref 36.0–46.0)
HCT: 27 % — ABNORMAL LOW (ref 36.0–46.0)
HCT: 27 % — ABNORMAL LOW (ref 36.0–46.0)
HCT: 29 % — ABNORMAL LOW (ref 36.0–46.0)
HCT: 25 % — ABNORMAL LOW (ref 36.0–46.0)
HCT: 26 % — ABNORMAL LOW (ref 36.0–46.0)
Hemoglobin: 12.6 g/dL (ref 12.0–15.0)
Hemoglobin: 8.5 g/dL — ABNORMAL LOW (ref 12.0–15.0)
Hemoglobin: 9.2 g/dL — ABNORMAL LOW (ref 12.0–15.0)
Hemoglobin: 9.2 g/dL — ABNORMAL LOW (ref 12.0–15.0)
Hemoglobin: 9.2 g/dL — ABNORMAL LOW (ref 12.0–15.0)
Hemoglobin: 9.9 g/dL — ABNORMAL LOW (ref 12.0–15.0)
Hemoglobin: 8.5 g/dL — ABNORMAL LOW (ref 12.0–15.0)
Hemoglobin: 8.8 g/dL — ABNORMAL LOW (ref 12.0–15.0)
O2 Saturation: 89 %
O2 Saturation: 100 %
O2 Saturation: 100 %
O2 Saturation: 97 %
O2 Saturation: 93 %
O2 Saturation: 94 %
O2 Saturation: 94 %
O2 Saturation: 91 %
Patient temperature: 37
Patient temperature: 36.8
Patient temperature: 36.9
Patient temperature: 37
Patient temperature: 37.2
Potassium: 3.9 mmol/L (ref 3.5–5.1)
Potassium: 3.9 mmol/L (ref 3.5–5.1)
Potassium: 4.4 mmol/L (ref 3.5–5.1)
Potassium: 3.3 mmol/L — ABNORMAL LOW (ref 3.5–5.1)
Potassium: 4 mmol/L (ref 3.5–5.1)
Potassium: 4.2 mmol/L (ref 3.5–5.1)
Potassium: 3.8 mmol/L (ref 3.5–5.1)
Potassium: 4.2 mmol/L (ref 3.5–5.1)
Sodium: 141 mmol/L (ref 135–145)
Sodium: 140 mmol/L (ref 135–145)
Sodium: 140 mmol/L (ref 135–145)
Sodium: 144 mmol/L (ref 135–145)
Sodium: 145 mmol/L (ref 135–145)
Sodium: 143 mmol/L (ref 135–145)
Sodium: 144 mmol/L (ref 135–145)
Sodium: 144 mmol/L (ref 135–145)
TCO2: 26 mmol/L (ref 22–32)
TCO2: 23 mmol/L (ref 22–32)
TCO2: 25 mmol/L (ref 22–32)
TCO2: 19 mmol/L — ABNORMAL LOW (ref 22–32)
TCO2: 22 mmol/L (ref 22–32)
TCO2: 23 mmol/L (ref 22–32)
TCO2: 21 mmol/L — ABNORMAL LOW (ref 22–32)
TCO2: 21 mmol/L — ABNORMAL LOW (ref 22–32)
pCO2 arterial: 58.7 mmHg — ABNORMAL HIGH (ref 32–48)
pCO2 arterial: 36.5 mmHg (ref 32–48)
pCO2 arterial: 41.3 mmHg (ref 32–48)
pCO2 arterial: 41 mmHg (ref 32–48)
pCO2 arterial: 54.8 mmHg — ABNORMAL HIGH (ref 32–48)
pCO2 arterial: 46.9 mmHg (ref 32–48)
pCO2 arterial: 40.4 mmHg (ref 32–48)
pCO2 arterial: 42 mmHg (ref 32–48)
pH, Arterial: 7.225 — ABNORMAL LOW (ref 7.35–7.45)
pH, Arterial: 7.396 (ref 7.35–7.45)
pH, Arterial: 7.368 (ref 7.35–7.45)
pH, Arterial: 7.236 — ABNORMAL LOW (ref 7.35–7.45)
pH, Arterial: 7.185 — CL (ref 7.35–7.45)
pH, Arterial: 7.266 — ABNORMAL LOW (ref 7.35–7.45)
pH, Arterial: 7.29 — ABNORMAL LOW (ref 7.35–7.45)
pH, Arterial: 7.271 — ABNORMAL LOW (ref 7.35–7.45)
pO2, Arterial: 68 mmHg — ABNORMAL LOW (ref 83–108)
pO2, Arterial: 429 mmHg — ABNORMAL HIGH (ref 83–108)
pO2, Arterial: 273 mmHg — ABNORMAL HIGH (ref 83–108)
pO2, Arterial: 101 mmHg (ref 83–108)
pO2, Arterial: 85 mmHg (ref 83–108)
pO2, Arterial: 80 mmHg — ABNORMAL LOW (ref 83–108)
pO2, Arterial: 79 mmHg — ABNORMAL LOW (ref 83–108)
pO2, Arterial: 71 mmHg — ABNORMAL LOW (ref 83–108)

## 2024-07-05 LAB — CBC
HCT: 30.8 % — ABNORMAL LOW (ref 36.0–46.0)
HCT: 29.2 % — ABNORMAL LOW (ref 36.0–46.0)
Hemoglobin: 9.9 g/dL — ABNORMAL LOW (ref 12.0–15.0)
Hemoglobin: 9.4 g/dL — ABNORMAL LOW (ref 12.0–15.0)
MCH: 28.4 pg (ref 26.0–34.0)
MCH: 28.2 pg (ref 26.0–34.0)
MCHC: 32.1 g/dL (ref 30.0–36.0)
MCHC: 32.2 g/dL (ref 30.0–36.0)
MCV: 88.5 fL (ref 80.0–100.0)
MCV: 87.7 fL (ref 80.0–100.0)
Platelets: 203 K/uL (ref 150–400)
Platelets: 181 K/uL (ref 150–400)
RBC: 3.48 MIL/uL — ABNORMAL LOW (ref 3.87–5.11)
RBC: 3.33 MIL/uL — ABNORMAL LOW (ref 3.87–5.11)
RDW: 14.2 % (ref 11.5–15.5)
RDW: 14.4 % (ref 11.5–15.5)
WBC: 27.2 K/uL — ABNORMAL HIGH (ref 4.0–10.5)
WBC: 17.6 K/uL — ABNORMAL HIGH (ref 4.0–10.5)
nRBC: 0 % (ref 0.0–0.2)
nRBC: 0 % (ref 0.0–0.2)

## 2024-07-05 LAB — POCT I-STAT EG7
Acid-base deficit: 1 mmol/L (ref 0.0–2.0)
Bicarbonate: 23.9 mmol/L (ref 20.0–28.0)
Calcium, Ion: 1.06 mmol/L — ABNORMAL LOW (ref 1.15–1.40)
HCT: 26 % — ABNORMAL LOW (ref 36.0–46.0)
Hemoglobin: 8.8 g/dL — ABNORMAL LOW (ref 12.0–15.0)
O2 Saturation: 79 %
Potassium: 3.7 mmol/L (ref 3.5–5.1)
Sodium: 141 mmol/L (ref 135–145)
TCO2: 25 mmol/L (ref 22–32)
pCO2, Ven: 40.8 mmHg — ABNORMAL LOW (ref 44–60)
pH, Ven: 7.375 (ref 7.25–7.43)
pO2, Ven: 44 mmHg (ref 32–45)

## 2024-07-05 LAB — GLUCOSE, CAPILLARY
Glucose-Capillary: 189 mg/dL — ABNORMAL HIGH (ref 70–99)
Glucose-Capillary: 144 mg/dL — ABNORMAL HIGH (ref 70–99)
Glucose-Capillary: 140 mg/dL — ABNORMAL HIGH (ref 70–99)
Glucose-Capillary: 140 mg/dL — ABNORMAL HIGH (ref 70–99)
Glucose-Capillary: 146 mg/dL — ABNORMAL HIGH (ref 70–99)
Glucose-Capillary: 155 mg/dL — ABNORMAL HIGH (ref 70–99)
Glucose-Capillary: 148 mg/dL — ABNORMAL HIGH (ref 70–99)
Glucose-Capillary: 144 mg/dL — ABNORMAL HIGH (ref 70–99)
Glucose-Capillary: 126 mg/dL — ABNORMAL HIGH (ref 70–99)

## 2024-07-05 LAB — BASIC METABOLIC PANEL WITH GFR
Anion gap: 9 (ref 5–15)
BUN: 16 mg/dL (ref 8–23)
CO2: 23 mmol/L (ref 22–32)
Calcium: 8.3 mg/dL — ABNORMAL LOW (ref 8.9–10.3)
Chloride: 107 mmol/L (ref 98–111)
Creatinine, Ser: 0.82 mg/dL (ref 0.44–1.00)
GFR, Estimated: >60 mL/min (ref >60)
Glucose, Bld: 162 mg/dL — ABNORMAL HIGH (ref 70–99)
Potassium: 4.2 mmol/L (ref 3.5–5.1)
Sodium: 139 mmol/L (ref 135–145)

## 2024-07-05 LAB — POCT I-STAT, CHEM 8
BUN: 19 mg/dL (ref 8–23)
BUN: 17 mg/dL (ref 8–23)
BUN: 18 mg/dL (ref 8–23)
Calcium, Ion: 1.25 mmol/L (ref 1.15–1.40)
Calcium, Ion: 1.05 mmol/L — ABNORMAL LOW (ref 1.15–1.40)
Calcium, Ion: 1.17 mmol/L (ref 1.15–1.40)
Chloride: 107 mmol/L (ref 98–111)
Chloride: 105 mmol/L (ref 98–111)
Chloride: 109 mmol/L (ref 98–111)
Creatinine, Ser: 0.8 mg/dL (ref 0.44–1.00)
Creatinine, Ser: 0.7 mg/dL (ref 0.44–1.00)
Creatinine, Ser: 0.8 mg/dL (ref 0.44–1.00)
Glucose, Bld: 166 mg/dL — ABNORMAL HIGH (ref 70–99)
Glucose, Bld: 143 mg/dL — ABNORMAL HIGH (ref 70–99)
Glucose, Bld: 158 mg/dL — ABNORMAL HIGH (ref 70–99)
HCT: 36 % (ref 36.0–46.0)
HCT: 25 % — ABNORMAL LOW (ref 36.0–46.0)
HCT: 32 % — ABNORMAL LOW (ref 36.0–46.0)
Hemoglobin: 12.2 g/dL (ref 12.0–15.0)
Hemoglobin: 10.9 g/dL — ABNORMAL LOW (ref 12.0–15.0)
Hemoglobin: 8.5 g/dL — ABNORMAL LOW (ref 12.0–15.0)
Potassium: 3.9 mmol/L (ref 3.5–5.1)
Potassium: 3.6 mmol/L (ref 3.5–5.1)
Potassium: 4.8 mmol/L (ref 3.5–5.1)
Sodium: 142 mmol/L (ref 135–145)
Sodium: 139 mmol/L (ref 135–145)
Sodium: 141 mmol/L (ref 135–145)
TCO2: 24 mmol/L (ref 22–32)
TCO2: 21 mmol/L — ABNORMAL LOW (ref 22–32)
TCO2: 24 mmol/L (ref 22–32)

## 2024-07-05 LAB — PLATELET COUNT: Platelets: 255 K/uL (ref 150–400)

## 2024-07-05 LAB — HEMOGLOBIN AND HEMATOCRIT, BLOOD
HCT: 28.7 % — ABNORMAL LOW (ref 36.0–46.0)
Hemoglobin: 9.1 g/dL — ABNORMAL LOW (ref 12.0–15.0)

## 2024-07-05 LAB — APTT: aPTT: 31 s (ref 24–36)

## 2024-07-05 LAB — ABO/RH: ABO/RH(D): A POS

## 2024-07-05 LAB — MAGNESIUM: Magnesium: 3 mg/dL — ABNORMAL HIGH (ref 1.7–2.4)

## 2024-07-05 LAB — PROTIME-INR
INR: 1.5 — ABNORMAL HIGH (ref 0.8–1.2)
Prothrombin Time: 18.6 s — ABNORMAL HIGH (ref 11.4–15.2)

## 2024-07-05 SURGERY — REPLACEMENT, AORTIC VALVE, OPEN
Anesthesia: General | Site: Chest

## 2024-07-05 MED ORDER — SODIUM CHLORIDE 0.45 % IV SOLN: INTRAVENOUS | Status: AC | PRN

## 2024-07-05 MED ORDER — ASPIRIN 81 MG PO CHEW
324.0000 mg | CHEWABLE_TABLET | Freq: Once | ORAL | Status: AC
  Administered 2024-07-05: 324 mg via ORAL
  Filled 2024-07-05: qty 4

## 2024-07-05 MED ORDER — SUGAMMADEX SODIUM 200 MG/2ML IV SOLN
INTRAVENOUS | Status: DC | PRN
Start: 2024-07-05 — End: 2024-07-05
  Administered 2024-07-05: 200 mg via INTRAVENOUS

## 2024-07-05 MED ORDER — METOPROLOL TARTRATE 25 MG/10 ML ORAL SUSPENSION
12.5000 mg | Freq: Two times a day (BID) | ORAL | Status: DC
  Filled 2024-07-05 (×2): qty 5

## 2024-07-05 MED ORDER — ALBUMIN HUMAN 5 % IV SOLN
250.0000 mL | INTRAVENOUS | Status: DC | PRN
  Administered 2024-07-05 (×3): 12.5 g via INTRAVENOUS
  Filled 2024-07-05 (×2): qty 250

## 2024-07-05 MED ORDER — ~~LOC~~ CARDIAC SURGERY, PATIENT & FAMILY EDUCATION
Freq: Once | Status: DC
  Filled 2024-07-05: qty 1

## 2024-07-05 MED ORDER — METOPROLOL TARTRATE 12.5 MG HALF TABLET
12.5000 mg | ORAL_TABLET | Freq: Two times a day (BID) | ORAL | Status: DC
  Administered 2024-07-05 – 2024-07-07 (×3): 12.5 mg via ORAL
  Filled 2024-07-05 (×3): qty 1

## 2024-07-05 MED ORDER — FENTANYL CITRATE (PF) 250 MCG/5ML IJ SOLN
INTRAMUSCULAR | Status: AC
  Filled 2024-07-05: qty 5

## 2024-07-05 MED ORDER — MIDAZOLAM HCL 2 MG/2ML IJ SOLN
2.0000 mg | INTRAMUSCULAR | Status: DC | PRN
  Administered 2024-07-05: 2 mg via INTRAVENOUS
  Filled 2024-07-05: qty 2

## 2024-07-05 MED ORDER — CLEVIDIPINE BUTYRATE 0.5 MG/ML IV EMUL
0.0000 mg/h | INTRAVENOUS | Status: DC
  Administered 2024-07-05: 2 mg/h via INTRAVENOUS

## 2024-07-05 MED ORDER — SODIUM CHLORIDE 0.9% FLUSH
10.0000 mL | Freq: Two times a day (BID) | INTRAVENOUS | Status: DC
  Administered 2024-07-05: 10 mL
  Administered 2024-07-05 – 2024-07-06 (×3): 20 mL
  Administered 2024-07-07: 10 mL

## 2024-07-05 MED ORDER — BISACODYL 10 MG RE SUPP
10.0000 mg | Freq: Every day | RECTAL | Status: DC
  Filled 2024-07-05: qty 1

## 2024-07-05 MED ORDER — CHLORHEXIDINE GLUCONATE CLOTH 2 % EX PADS
6.0000 | MEDICATED_PAD | Freq: Every day | CUTANEOUS | Status: DC
  Administered 2024-07-05 – 2024-07-07 (×3): 6 via TOPICAL

## 2024-07-05 MED ORDER — DEXMEDETOMIDINE HCL IN NACL 400 MCG/100ML IV SOLN: 0.0000 ug/kg/h | INTRAVENOUS | Status: DC

## 2024-07-05 MED ORDER — SODIUM CHLORIDE 0.9% FLUSH: 3.0000 mL | INTRAVENOUS | Status: DC | PRN

## 2024-07-05 MED ORDER — SODIUM CHLORIDE 0.9 % IV SOLN: INTRAVENOUS | Status: DC | PRN

## 2024-07-05 MED ORDER — ASPIRIN 81 MG PO CHEW: 324.0000 mg | CHEWABLE_TABLET | Freq: Every day | ORAL | Status: DC

## 2024-07-05 MED ORDER — PROPOFOL 10 MG/ML IV BOLUS
INTRAVENOUS | Status: AC
Start: 2024-07-05 — End: 2024-07-05
  Filled 2024-07-05: qty 20

## 2024-07-05 MED ORDER — MIDAZOLAM HCL (PF) 10 MG/2ML IJ SOLN
INTRAMUSCULAR | Status: AC
  Filled 2024-07-05: qty 2

## 2024-07-05 MED ORDER — ALBUMIN HUMAN 5 % IV SOLN: INTRAVENOUS | Status: DC | PRN

## 2024-07-05 MED ORDER — MIDAZOLAM HCL (PF) 5 MG/ML IJ SOLN
INTRAMUSCULAR | Status: DC | PRN
Start: 2024-07-05 — End: 2024-07-05
  Administered 2024-07-05: 1 mg via INTRAVENOUS
  Administered 2024-07-05 (×2): 2 mg via INTRAVENOUS

## 2024-07-05 MED ORDER — ACETAMINOPHEN 160 MG/5ML PO SOLN
1000.0000 mg | Freq: Four times a day (QID) | ORAL | Status: AC
Start: 2024-07-06 — End: 2024-07-10

## 2024-07-05 MED ORDER — METOPROLOL TARTRATE 5 MG/5ML IV SOLN: 2.5000 mg | INTRAVENOUS | Status: DC | PRN

## 2024-07-05 MED ORDER — ACETAMINOPHEN 325 MG PO TABS
650.0000 mg | ORAL_TABLET | Freq: Once | ORAL | Status: AC
  Administered 2024-07-05: 650 mg via ORAL
  Filled 2024-07-05: qty 2

## 2024-07-05 MED ORDER — OXYCODONE HCL 5 MG PO TABS
5.0000 mg | ORAL_TABLET | ORAL | Status: DC | PRN
  Administered 2024-07-06: 5 mg via ORAL
  Filled 2024-07-05: qty 2
  Filled 2024-07-05 (×2): qty 1

## 2024-07-05 MED ORDER — ONDANSETRON HCL 4 MG/2ML IJ SOLN
INTRAMUSCULAR | Status: AC
  Filled 2024-07-05: qty 2

## 2024-07-05 MED ORDER — PHENYLEPHRINE 80 MCG/ML (10ML) SYRINGE FOR IV PUSH (FOR BLOOD PRESSURE SUPPORT)
PREFILLED_SYRINGE | INTRAVENOUS | Status: AC
Start: 2024-07-05 — End: 2024-07-05
  Filled 2024-07-05: qty 10

## 2024-07-05 MED ORDER — 0.9 % SODIUM CHLORIDE (POUR BTL) OPTIME
TOPICAL | Status: DC | PRN
  Administered 2024-07-05: 5000 mL

## 2024-07-05 MED ORDER — LACTATED RINGERS IV SOLN: INTRAVENOUS | Status: AC

## 2024-07-05 MED ORDER — SODIUM BICARBONATE 8.4 % IV SOLN
50.0000 meq | Freq: Once | INTRAVENOUS | Status: AC
  Administered 2024-07-05: 50 meq via INTRAVENOUS

## 2024-07-05 MED ORDER — PANTOPRAZOLE SODIUM 40 MG PO TBEC
40.0000 mg | DELAYED_RELEASE_TABLET | Freq: Every day | ORAL | Status: DC
  Administered 2024-07-07 – 2024-07-11 (×5): 40 mg via ORAL
  Filled 2024-07-05 (×5): qty 1

## 2024-07-05 MED ORDER — ASPIRIN 325 MG PO TBEC
325.0000 mg | DELAYED_RELEASE_TABLET | Freq: Every day | ORAL | Status: DC
  Administered 2024-07-06 – 2024-07-11 (×6): 325 mg via ORAL
  Filled 2024-07-05 (×6): qty 1

## 2024-07-05 MED ORDER — SODIUM CHLORIDE 0.9 % IV SOLN: INTRAVENOUS | Status: AC

## 2024-07-05 MED ORDER — CLEVIDIPINE BUTYRATE 0.5 MG/ML IV EMUL
INTRAVENOUS | Status: DC | PRN
  Administered 2024-07-05: 4 mg/h via INTRAVENOUS

## 2024-07-05 MED ORDER — ROCURONIUM BROMIDE 10 MG/ML (PF) SYRINGE
PREFILLED_SYRINGE | INTRAVENOUS | Status: AC
  Filled 2024-07-05: qty 10

## 2024-07-05 MED ORDER — CHLORHEXIDINE GLUCONATE 0.12 % MT SOLN
15.0000 mL | Freq: Once | OROMUCOSAL | Status: AC
  Administered 2024-07-05: 15 mL via OROMUCOSAL
  Filled 2024-07-05: qty 15

## 2024-07-05 MED ORDER — BISACODYL 5 MG PO TBEC
10.0000 mg | DELAYED_RELEASE_TABLET | Freq: Every day | ORAL | Status: DC
  Administered 2024-07-06 – 2024-07-09 (×3): 10 mg via ORAL
  Filled 2024-07-05 (×3): qty 2

## 2024-07-05 MED ORDER — ONDANSETRON HCL 4 MG/2ML IJ SOLN
4.0000 mg | Freq: Four times a day (QID) | INTRAMUSCULAR | Status: DC | PRN
  Administered 2024-07-07 – 2024-07-08 (×2): 4 mg via INTRAVENOUS
  Filled 2024-07-05 (×2): qty 2

## 2024-07-05 MED ORDER — ROCURONIUM BROMIDE 10 MG/ML (PF) SYRINGE
PREFILLED_SYRINGE | INTRAVENOUS | Status: DC | PRN
  Administered 2024-07-05 (×2): 20 mg via INTRAVENOUS
  Administered 2024-07-05: 80 mg via INTRAVENOUS

## 2024-07-05 MED ORDER — ACETAMINOPHEN 160 MG/5ML PO SOLN: 650.0000 mg | Freq: Once | ORAL | Status: DC

## 2024-07-05 MED ORDER — ONDANSETRON HCL 4 MG/2ML IJ SOLN
INTRAMUSCULAR | Status: DC | PRN
  Administered 2024-07-05: 4 mg via INTRAVENOUS

## 2024-07-05 MED ORDER — CHLORHEXIDINE GLUCONATE 0.12 % MT SOLN
15.0000 mL | OROMUCOSAL | Status: AC
  Administered 2024-07-05: 15 mL via OROMUCOSAL
  Filled 2024-07-05: qty 15

## 2024-07-05 MED ORDER — PROPOFOL 10 MG/ML IV BOLUS
INTRAVENOUS | Status: DC | PRN
  Administered 2024-07-05: 90 mg via INTRAVENOUS

## 2024-07-05 MED ORDER — MORPHINE SULFATE (PF) 2 MG/ML IV SOLN
1.0000 mg | INTRAVENOUS | Status: DC | PRN
  Administered 2024-07-05 (×2): 2 mg via INTRAVENOUS
  Filled 2024-07-05 (×2): qty 1

## 2024-07-05 MED ORDER — PHENYLEPHRINE 80 MCG/ML (10ML) SYRINGE FOR IV PUSH (FOR BLOOD PRESSURE SUPPORT)
PREFILLED_SYRINGE | INTRAVENOUS | Status: DC | PRN
Start: 2024-07-05 — End: 2024-07-05
  Administered 2024-07-05: 160 ug via INTRAVENOUS
  Administered 2024-07-05 (×2): 40 ug via INTRAVENOUS

## 2024-07-05 MED ORDER — PANTOPRAZOLE SODIUM 40 MG IV SOLR
40.0000 mg | Freq: Every day | INTRAVENOUS | Status: AC
  Administered 2024-07-05 – 2024-07-06 (×2): 40 mg via INTRAVENOUS
  Filled 2024-07-05 (×2): qty 10

## 2024-07-05 MED ORDER — FENTANYL CITRATE (PF) 250 MCG/5ML IJ SOLN
INTRAMUSCULAR | Status: DC | PRN
  Administered 2024-07-05 (×4): 100 ug via INTRAVENOUS
  Administered 2024-07-05: 50 ug via INTRAVENOUS
  Administered 2024-07-05 (×2): 100 ug via INTRAVENOUS
  Administered 2024-07-05: 150 ug via INTRAVENOUS
  Administered 2024-07-05 (×2): 100 ug via INTRAVENOUS

## 2024-07-05 MED ORDER — METOPROLOL TARTRATE 12.5 MG HALF TABLET: 12.5000 mg | ORAL_TABLET | Freq: Once | ORAL | Status: DC

## 2024-07-05 MED ORDER — MAGNESIUM SULFATE 4 GM/100ML IV SOLN
4.0000 g | Freq: Once | INTRAVENOUS | Status: AC
  Administered 2024-07-05: 4 g via INTRAVENOUS
  Filled 2024-07-05: qty 100

## 2024-07-05 MED ORDER — DEXAMETHASONE SODIUM PHOSPHATE 10 MG/ML IJ SOLN
INTRAMUSCULAR | Status: DC | PRN
  Administered 2024-07-05: 10 mg via INTRAVENOUS

## 2024-07-05 MED ORDER — INSULIN REGULAR(HUMAN) IN NACL 100-0.9 UT/100ML-% IV SOLN: INTRAVENOUS | Status: DC

## 2024-07-05 MED ORDER — VASOPRESSIN 20 UNIT/ML IV SOLN
INTRAVENOUS | Status: AC
  Filled 2024-07-05: qty 1

## 2024-07-05 MED ORDER — CLEVIDIPINE BUTYRATE 0.5 MG/ML IV EMUL
INTRAVENOUS | Status: AC
  Filled 2024-07-05: qty 50

## 2024-07-05 MED ORDER — LACTATED RINGERS IV SOLN: INTRAVENOUS | Status: DC | PRN

## 2024-07-05 MED ORDER — CHLORHEXIDINE GLUCONATE 4 % EX SOLN: 30.0000 mL | CUTANEOUS | Status: DC

## 2024-07-05 MED ORDER — DEXTROSE 50 % IV SOLN: 0.0000 mL | INTRAVENOUS | Status: DC | PRN

## 2024-07-05 MED ORDER — DOCUSATE SODIUM 100 MG PO CAPS
200.0000 mg | ORAL_CAPSULE | Freq: Every day | ORAL | Status: DC
  Administered 2024-07-06 – 2024-07-11 (×4): 200 mg via ORAL
  Filled 2024-07-05 (×4): qty 2

## 2024-07-05 MED ORDER — PLASMA-LYTE A IV SOLN: INTRAVENOUS | Status: DC | PRN

## 2024-07-05 MED ORDER — SODIUM CHLORIDE 0.9% FLUSH: 10.0000 mL | INTRAVENOUS | Status: DC | PRN

## 2024-07-05 MED ORDER — HEPARIN SODIUM (PORCINE) 1000 UNIT/ML IJ SOLN: INTRAMUSCULAR | Status: DC | PRN

## 2024-07-05 MED ORDER — HEPARIN SODIUM (PORCINE) 1000 UNIT/ML IJ SOLN
INTRAMUSCULAR | Status: DC | PRN
  Administered 2024-07-05: 35000 [IU] via INTRAVENOUS

## 2024-07-05 MED ORDER — EZETIMIBE 10 MG PO TABS
10.0000 mg | ORAL_TABLET | Freq: Every day | ORAL | Status: DC
  Administered 2024-07-06 – 2024-07-11 (×6): 10 mg via ORAL
  Filled 2024-07-05 (×6): qty 1

## 2024-07-05 MED ORDER — SODIUM CHLORIDE 0.9% FLUSH
3.0000 mL | Freq: Two times a day (BID) | INTRAVENOUS | Status: DC
Start: 2024-07-06 — End: 2024-07-10
  Administered 2024-07-06 – 2024-07-09 (×3): 3 mL via INTRAVENOUS

## 2024-07-05 MED ORDER — VANCOMYCIN HCL IN DEXTROSE 1-5 GM/200ML-% IV SOLN
1000.0000 mg | Freq: Once | INTRAVENOUS | Status: AC
  Administered 2024-07-05: 1000 mg via INTRAVENOUS
  Filled 2024-07-05: qty 200

## 2024-07-05 MED ORDER — TRAMADOL HCL 50 MG PO TABS
50.0000 mg | ORAL_TABLET | ORAL | Status: DC | PRN
  Administered 2024-07-05 – 2024-07-07 (×6): 50 mg via ORAL
  Filled 2024-07-05 (×6): qty 1

## 2024-07-05 MED ORDER — ACETAMINOPHEN 500 MG PO TABS
1000.0000 mg | ORAL_TABLET | Freq: Four times a day (QID) | ORAL | Status: AC
  Administered 2024-07-05 – 2024-07-10 (×13): 1000 mg via ORAL
  Filled 2024-07-05 (×15): qty 2

## 2024-07-05 MED ORDER — METOCLOPRAMIDE HCL 5 MG/ML IJ SOLN
10.0000 mg | Freq: Four times a day (QID) | INTRAMUSCULAR | Status: AC
  Administered 2024-07-05 – 2024-07-06 (×6): 10 mg via INTRAVENOUS
  Filled 2024-07-05 (×6): qty 2

## 2024-07-05 MED ORDER — SODIUM CHLORIDE 0.9 % IV SOLN: 250.0000 mL | INTRAVENOUS | Status: AC

## 2024-07-05 MED ORDER — POTASSIUM CHLORIDE 10 MEQ/50ML IV SOLN
10.0000 meq | INTRAVENOUS | Status: AC
  Administered 2024-07-05 (×3): 10 meq via INTRAVENOUS

## 2024-07-05 MED ORDER — SODIUM CHLORIDE (PF) 0.9 % IJ SOLN: OROMUCOSAL | Status: DC | PRN

## 2024-07-05 MED ORDER — NOREPINEPHRINE 4 MG/250ML-% IV SOLN: 0.0000 ug/min | INTRAVENOUS | Status: DC

## 2024-07-05 MED ORDER — PROTAMINE SULFATE 10 MG/ML IV SOLN
INTRAVENOUS | Status: DC | PRN
  Administered 2024-07-05: 300 mg via INTRAVENOUS

## 2024-07-05 MED ORDER — LEVOFLOXACIN IN D5W 750 MG/150ML IV SOLN
750.0000 mg | INTRAVENOUS | Status: AC
  Administered 2024-07-06: 750 mg via INTRAVENOUS
  Filled 2024-07-05: qty 150

## 2024-07-05 MED FILL — Heparin Sodium (Porcine) Inj 1000 Unit/ML: Qty: 1000 | Status: AC

## 2024-07-05 MED FILL — Potassium Chloride Inj 2 mEq/ML: INTRAVENOUS | Qty: 40 | Status: AC

## 2024-07-05 MED FILL — Lidocaine HCl Local Preservative Free (PF) Inj 2%: INTRAMUSCULAR | Qty: 14 | Status: AC

## 2024-07-05 SURGICAL SUPPLY — 71 items
ADAPTER CARDIO PERF ANTE/RETRO (ADAPTER) ×2 IMPLANT
ADAPTER MULTI PERFUSION 15 (ADAPTER) IMPLANT
BAG DECANTER FOR FLEXI CONT (MISCELLANEOUS) ×2 IMPLANT
BINDER BREAST XXLRG (GAUZE/BANDAGES/DRESSINGS) IMPLANT
BLADE CLIPPER SURG (BLADE) ×2 IMPLANT
BLADE STERNUM SYSTEM 6 (BLADE) ×2 IMPLANT
BLADE SURG 11 STRL SS (BLADE) IMPLANT
BLADE SURG 15 STRL LF DISP TIS (BLADE) ×2 IMPLANT
CANISTER SUCTION 3000ML PPV (SUCTIONS) ×2 IMPLANT
CANNULA GUNDRY RCSP 15FR (MISCELLANEOUS) ×2 IMPLANT
CANNULA MC2 2 STG 29/37 NON-V (CANNULA) IMPLANT
CANNULA NON VENT 20FR 12 (CANNULA) ×2 IMPLANT
CANNULA SUMP PERICARDIAL (CANNULA) IMPLANT
CATH HEART VENT LEFT (CATHETERS) ×2 IMPLANT
CATH ROBINSON RED A/P 18FR (CATHETERS) ×6 IMPLANT
CNTNR URN SCR LID CUP LEK RST (MISCELLANEOUS) ×2 IMPLANT
CONTAINER PROTECT SURGISLUSH (MISCELLANEOUS) ×4 IMPLANT
COUNTER NDL 20CT MAGNET RED (NEEDLE) IMPLANT
COVER SURGICAL LIGHT HANDLE (MISCELLANEOUS) ×2 IMPLANT
DEVICE SUT CK QUICK LOAD INDV (Prosthesis & Implant Heart) IMPLANT
DEVICE SUT CK QUICK LOAD MINI (Prosthesis & Implant Heart) IMPLANT
DRAIN CHANNEL 19F RND (DRAIN) ×2 IMPLANT
DRAPE SRG 135X102X78XABS (DRAPES) ×2 IMPLANT
DRAPE WARM FLUID 44X44 (DRAPES) ×2 IMPLANT
DRSG AQUACEL AG ADV 3.5X10 (GAUZE/BANDAGES/DRESSINGS) IMPLANT
ELECT CAUTERY BLADE 6.4 (BLADE) ×2 IMPLANT
ELECTRODE REM PT RTRN 9FT ADLT (ELECTROSURGICAL) ×4 IMPLANT
ELECTRODE SOLI GEL RDN PROPADZ (MISCELLANEOUS) ×2 IMPLANT
FELT TEFLON 1X6 (MISCELLANEOUS) ×4 IMPLANT
GAUZE SPONGE 4X4 12PLY STRL (GAUZE/BANDAGES/DRESSINGS) ×2 IMPLANT
GLOVE BIO SURGEON STRL SZ 6 (GLOVE) IMPLANT
GLOVE BIO SURGEON STRL SZ 6.5 (GLOVE) IMPLANT
GLOVE BIO SURGEON STRL SZ7 (GLOVE) IMPLANT
GLOVE BIO SURGEON STRL SZ7.5 (GLOVE) IMPLANT
GOWN STRL REUS W/ TWL LRG LVL3 (GOWN DISPOSABLE) ×8 IMPLANT
GOWN STRL REUS W/ TWL XL LVL3 (GOWN DISPOSABLE) ×4 IMPLANT
GOWN STRL SURGICAL XL XLNG (GOWN DISPOSABLE) ×2 IMPLANT
HEMOSTAT POWDER SURGIFOAM 1G (HEMOSTASIS) ×4 IMPLANT
HEMOSTAT SURGICEL 2X14 (HEMOSTASIS) ×2 IMPLANT
INSERT SUTURE HOLDER (MISCELLANEOUS) ×2 IMPLANT
KIT BASIN OR (CUSTOM PROCEDURE TRAY) ×2 IMPLANT
KIT SUCTION CATH 14FR (SUCTIONS) ×2 IMPLANT
KIT SUT CK MINI COMBO 4X17 (Prosthesis & Implant Heart) IMPLANT
KIT TURNOVER KIT B (KITS) ×2 IMPLANT
LINE VENT (MISCELLANEOUS) IMPLANT
NS IRRIG 1000ML POUR BTL (IV SOLUTION) ×12 IMPLANT
ORGANIZER SUTURE GABBAY-FRATER (MISCELLANEOUS) ×2 IMPLANT
PACK E OPEN HEART (SUTURE) ×2 IMPLANT
PACK OPEN HEART (CUSTOM PROCEDURE TRAY) ×2 IMPLANT
PAD ARMBOARD POSITIONER FOAM (MISCELLANEOUS) ×4 IMPLANT
POSITIONER HEAD DONUT 9IN (MISCELLANEOUS) ×2 IMPLANT
SET MPS 3-ND DEL (MISCELLANEOUS) IMPLANT
SUT BONE WAX W31G (SUTURE) ×2 IMPLANT
SUT EB EXC GRN/WHT 2-0 V-5 (SUTURE) ×4 IMPLANT
SUT PROLENE 3 0 SH DA (SUTURE) IMPLANT
SUT PROLENE 3 0 SH1 36 (SUTURE) ×2 IMPLANT
SUT PROLENE 4 0 SH DA (SUTURE) IMPLANT
SUT PROLENE 4-0 RB1 .5 CRCL 36 (SUTURE) ×6 IMPLANT
SUT STEEL 6MS V (SUTURE) IMPLANT
SUT STEEL STERNAL CCS#1 18IN (SUTURE) IMPLANT
SYSTEM SAHARA CHEST DRAIN ATS (WOUND CARE) ×2 IMPLANT
TAPE CLOTH SURG 4X10 WHT LF (GAUZE/BANDAGES/DRESSINGS) IMPLANT
TAPE PAPER 2X10 WHT MICROPORE (GAUZE/BANDAGES/DRESSINGS) IMPLANT
TOWEL GREEN STERILE (TOWEL DISPOSABLE) ×2 IMPLANT
TOWEL GREEN STERILE FF (TOWEL DISPOSABLE) ×2 IMPLANT
TRAY FOLEY SLVR 14FR TEMP STAT (SET/KITS/TRAYS/PACK) IMPLANT
TRAY FOLEY SLVR 16FR TEMP STAT (SET/KITS/TRAYS/PACK) ×2 IMPLANT
TUBING PVC 1/4X1/16 WALL 8 (MISCELLANEOUS) IMPLANT
UNDERPAD 30X36 HEAVY ABSORB (UNDERPADS AND DIAPERS) ×2 IMPLANT
VALVE AORTIC SZ21 INSP/RESIL (Valve) IMPLANT
WATER STERILE IRR 1000ML POUR (IV SOLUTION) ×4 IMPLANT

## 2024-07-05 NOTE — Procedures (Signed)
 Extubation Procedure Note  Patient Details:   Name: Shari Lee DOB: 05/11/1956 MRN: [id number]   Airway Documentation:  Airway (Active)   Vent end date: 07/05/24 Vent end time: 1558   Evaluation  O2 sats: stable throughout Complications: No apparent complications Patient did tolerate procedure well. Bilateral Breath Sounds: Rhonchi, Diminished   Patient extubated wit RT and RN at bedside. Placed on 3L [place] and tolerating well at this time. NIF - 35, VC 1.2L, cuff leak present prior to extubating, no stridor noted post. [name] able to speak her name and is aware of time and place.      Yes  [name] [name] 07/05/2024, 4:02 PM

## 2024-07-05 NOTE — Transfer of Care (Signed)
 Immediate Anesthesia Transfer of Care Note  Patient: Shari Lee  Procedure(s) Performed: REPLACEMENT, AORTIC VALVE, OPEN USING INSPIRIS VALVE (Chest) ECHOCARDIOGRAM, TRANSESOPHAGEAL  Patient Location: ICU  Anesthesia Type:General  Level of Consciousness: Patient remains intubated per anesthesia plan  Airway & Oxygen Therapy: Patient remains intubated per anesthesia plan and Patient placed on Ventilator (see vital sign flow sheet for setting)  Post-op Assessment: Report given to RN and Post -op Vital signs reviewed and stable  Post vital signs: Reviewed and stable  Last Vitals:  Vitals Value Taken Time  BP 132/76   Temp    Pulse 80   Resp 16   SpO2 98     Last Pain:  Vitals:   07/05/24 0600  TempSrc:   PainSc: 0-No pain         Complications: arrived in ICU-pt stable and report gien with no questions from ICU. Pt reversed with sugammadex  per request from ICU. Upon leaving, BP noted to be down to 69 systolic. Cleviprex  off and Neo restarted at 30mcg/mn. Bolus of 160mcg given with effect. BP stable .

## 2024-07-05 NOTE — Anesthesia Procedure Notes (Signed)
 Arterial Line Insertion Start/End9/18/2025 6:50 AM, 07/05/2024 7:00 AM Performed by: Leopoldo Bruckner, MD, Holtzman, Derrek Dacosta, CRNA, CRNA  Patient location: Pre-op. Preanesthetic checklist: patient identified, IV checked, site marked, risks and benefits discussed, surgical consent, monitors and equipment checked, pre-op evaluation, timeout performed and anesthesia consent Lidocaine  1% used for infiltration Left, radial was placed Catheter size: 20 G Hand hygiene performed  and maximum sterile barriers used   Attempts: 1 Procedure performed using ultrasound guided technique. Ultrasound Notes:anatomy identified, needle tip was noted to be adjacent to the nerve/plexus identified and no ultrasound evidence of intravascular and/or intraneural injection Following insertion, dressing applied. Post procedure assessment: normal and unchanged  Patient tolerated the procedure well with no immediate complications.

## 2024-07-05 NOTE — Brief Op Note (Signed)
 07/05/2024  2:14 PM  PATIENT:  Shari Lee  68 y.o. female  PRE-OPERATIVE DIAGNOSIS:  AS  POST-OPERATIVE DIAGNOSIS:  AORTIC STENOSIS  PROCEDURE:  Procedure(s): REPLACEMENT, AORTIC VALVE, OPEN USING INSPIRIS VALVE (N/A) ECHOCARDIOGRAM, TRANSESOPHAGEAL (N/A)  SURGEON:  Surgeons and Role:    * Lightfoot, Linnie KIDD, MD - Primary  PHYSICIAN ASSISTANT: Masato Pettie PA-C  ASSISTANTS: MALLORY HARDY RNFA   ANESTHESIA:   general  EBL:  {None/Minimal: 21241}   BLOOD ADMINISTERED:none  DRAINS: MEDIASTINAL CHEST TUBES   LOCAL MEDICATIONS USED:  NONE  SPECIMEN:  Source of Specimen:  AORTIC VALVE LEAFLETS  DISPOSITION OF SPECIMEN:  PATHOLOGY  COUNTS:  YES  TOURNIQUET:  * No tourniquets in log *  DICTATION: .Dragon Dictation  PLAN OF CARE: Admit to inpatient   PATIENT DISPOSITION:  ICU - intubated and hemodynamically stable.   Delay start of Pharmacological VTE agent (>24hrs) due to surgical blood loss or risk of bleeding: yes  COMPLICATIONS: NO KNOWN

## 2024-07-05 NOTE — Discharge Instructions (Signed)
 Financial Resources Agency Name: Nurse, adult Counseling Address: [address]  [place] [address] Phone: [phone] Website: http://www.fspcares.org/financial-[email or web address]/ Service(s) Offered: Assists individuals and families, avoid bankruptcy or  foreclosure, achieve financial goals, and regain a sense  of financial well-being. Agency Name: [facility] Social [facility] Address: [address] [place] [address] Phone: [phone] Website: StockBudget.de Service(s) Offered: Provides medical, food/nutrition, child care, low-income  energy, Work First and emergency assistance. Agency Name: [facility] Address: [address] [place] [address] Phone: [phone] Website: http://greensborourbanministry.org/ Service(s) Offered: Food assistance hours are from 9:30 a.m. to 4:30 p.m.  Monday through Friday. Those who need to come after  4:30 p.m. should call [phone] or [phone], to make  arrangements. Food Agency Name: [facility] Support [facility] Nutrition Program Address: [address] [place] [address] Phone: [phone] Website: SmoothHits.com.cy Service(s) Offered: Provides grocery assistance to income eligible  individuals and their families. Hours are MondayFriday 10am-2pm by appointment only. Agency Name: [facility] Address: [address] [place]. [place] [address] Phone: [phone] Website: http://greensborourbanministry.org/ Service(s) Offered: Through the Emergency Assistance Program (EAP),  financial assistance is provided for costs necessary to  maintain housing - utility, water , rent, and/or mortgage  payments. [facility] residents specifically. Call to  schedule an appointment. Agency Name: [facility] Address: [address], [place] [address] Phone:  [phone] January 18, 2018 8 Website: [email or web address].[email or web address] Service(s) Offered: Food assistance hours are Tues. & Thurs. 9am-12pm.  Please bring your driver's license and social security  cards for all members of your household.

## 2024-07-05 NOTE — Op Note (Signed)
 [address]       [name] [address]             [phone]                                           07/05/2024 Patient:  Shari Lee Pre-Op Dx: severe aortic stenosis   Post-op Dx:  same Procedure: Aortic valve replacement with a 21mm Insipiris Valve     Surgeon and Role:      * Shari Lee, Shari KIDD, MD - Primary    Shari Lee - PA-C  An experienced assistant was required given the complexity of this surgery and the standard of surgical care. The assistant was needed for exposure, dissection, suctioning, retraction of delicate tissues and sutures, instrument exchange and for overall help during this procedure.    Anesthesia  general EBL:  500ml Blood Administration: none Xclamp Time:  96 min Pump Time:   Drains: 44 F blake drain: mediastinal  X2 Wires: V Counts: correct   Indications: 68 y.o. female with severe aortic stenosis.  STS score: 1.99.  NYHA Class II.  She has a bicuspid aortic valve, and no ascending aortic aneurysm.  She sized to a 23mm valve, and based off of her BSA this should be sufficient without a root enlargement.  The risks and benefits of a bioprosthetic AVR were discussed in detail.  The patient is agreeable to proceed.   Findings: Bicuspid valve with fusion of the left and right leaflets  Operative Technique: All invasive lines were placed in pre-op holding.  After the risks, benefits and alternatives were thoroughly discussed, the patient was brought to the operative theatre.  Anesthesia was induced, and the patient was prepped and draped in normal sterile fashion.  An appropriate surgical pause was performed, and pre-operative antibiotics were dosed accordingly.  We began with an incision over the chest for the sternotomy.  This was carried down with bovie cautery, and the sternum was divided with a reciprocating saw.  Meticulous hemostasis was obtained.  The patient was systemically heparinized.   The sternal  retractor was placed.  The pericardium was divided in the midline and fashioned into a cradle with pericardial stitches.   After we confirmed an appropriate ACT, the ascending aorta was cannulated in standard fashion.  The right atrial appendage was used for venous cannulation site.  Cardiopulmonary bypass was initiated and we began to cool the patient to 32 degrees. The cross clamp was applied, and a dose of anterograde cardioplegia was given with good arrest of the heart.  Our aortotomy was made and directed toward the non coronary cusp.  The valve was inspected.   All leaflets were excised.  The annulus was sized to a 21mm.  The left ventricle was then copiously irrigated.  Pledgeted mattress sutures were placed circumferentially through the annulus.  These sutures were then passed through the sewing ring of the valve.  Once the valve was seated in the annulus, it was secured with Core-knot sutures.  We began to rewarm, and close our aortotomy in 2 layers.  A re-animation dose of cardioplegia was given.  After de-airing the heart, the aortic cross clamp was removed.  We checked our valve function, and for air using the TEE.  Once we were satisfying, we separated from cardiopulmonary bypass without event.    The  heparin  was reversed with protamine , and hemostasis was obtained.  Chest tubes and wires were placed, and the sternum was re-approximated with with sternal wires.  The soft tissue and skin were re-approximated wth absorbable suture.    The patient tolerated the procedure without any immediate complications, and was transferred to the ICU in guarded condition.  Shari Lee Shari Lee

## 2024-07-05 NOTE — TOC CM/SW Note (Signed)
 CSW added financial resources to patients AVS.

## 2024-07-05 NOTE — Anesthesia Procedure Notes (Addendum)
 Central Venous Catheter Insertion Performed by: Leopoldo Bruckner, MD, anesthesiologist Start/End9/18/2025 7:30 AM, 07/05/2024 7:37 AM Patient location: Pre-op. Preanesthetic checklist: patient identified, IV checked, site marked, risks and benefits discussed, surgical consent, monitors and equipment checked, pre-op evaluation, timeout performed and anesthesia consent Lidocaine  1% used for infiltration and patient sedated Hand hygiene performed  and maximum sterile barriers used  Catheter size: 9 Fr Total catheter length 10. MAC introducer Procedure performed using ultrasound guided technique. Ultrasound Notes:anatomy identified, needle tip was noted to be adjacent to the nerve/plexus identified, no ultrasound evidence of intravascular and/or intraneural injection and image(s) printed for medical record Attempts: 1 Following insertion, line sutured, dressing applied and Biopatch. Post procedure assessment: blood return through all ports, free fluid flow and no air  Patient tolerated the procedure well with no immediate complications.

## 2024-07-05 NOTE — Progress Notes (Signed)
 Patient ID: Shari Lee, female   DOB: October 04, 1956, 68 y.o.   MRN: [id number]   TCTS Evening Rounds:   Hemodynamically stable  CI = 3  Extubated and dangled  Urine output good  CT output low  CBC    Component Value Date/Time   WBC 27.2 (H) 07/05/2024 1314   RBC 3.48 (L) 07/05/2024 1314   HGB 8.5 (L) 07/05/2024 1552   HGB 13.5 03/13/2024 1542   HCT 25.0 (L) 07/05/2024 1552   HCT 44.1 03/13/2024 1542   PLT 203 07/05/2024 1314   PLT 358 03/13/2024 1542   MCV 88.5 07/05/2024 1314   MCV 90 03/13/2024 1542   MCH 28.4 07/05/2024 1314   MCHC 32.1 07/05/2024 1314   RDW 14.2 07/05/2024 1314   RDW 12.7 03/13/2024 1542   LYMPHSABS 3.0 03/21/2023 1015   MONOABS 1.3 (H) 10/13/2020 0415   EOSABS 0.2 03/21/2023 1015   BASOSABS 0.1 03/21/2023 1015     BMET    Component Value Date/Time   NA 144 07/05/2024 1552   NA 138 04/10/2024 1213   K 3.8 07/05/2024 1552   CL 105 07/05/2024 1003   CO2 21 (L) 07/03/2024 1103   GLUCOSE 143 (H) 07/05/2024 1003   BUN 18 07/05/2024 1003   BUN 17 04/10/2024 1213   CREATININE 0.70 07/05/2024 1003   CALCIUM  9.1 07/03/2024 1103   EGFR 65 04/10/2024 1213   GFRNONAA >60 07/03/2024 1103     A/P:  Stable postop course. Continue current plans

## 2024-07-05 NOTE — Progress Notes (Signed)
 NAME:  Shari Lee, MRN:  [id number], DOB:  05/13/1956, LOS: 0 ADMISSION DATE:  07/05/2024, CONSULTATION DATE:  07/05/24 REFERRING MD:  MD Lightfoot CHIEF COMPLAINT:  Severe Aortic Stenosis/Elective AVR  History of Present Illness:  Pt is a 68 yr old female with significant past medical history of severe aortic stenosis, hypertension, asthma, sleep apnea, type 2 diabetes, and GERD presents for elective aortic valve repair by MD Lightfoot.  PCCM consulted for postop vent management as well as assisting with care during ICU stay.  Pertinent  Medical History   Past Medical History:  Diagnosis Date   Asthma    mild   Diarrhea    N/V recently   Dyspnea    GERD (gastroesophageal reflux disease)    Headache    Heart murmur    developed in last couple years/ no issues   HOH (hard of hearing)    wears aids   Hypertension    Motion sickness    car   Sleep apnea    Type 2 diabetes mellitus with hyperglycemia (HCC)      Significant Hospital Events: Including procedures, antibiotic start and stop dates in addition to other pertinent events   9/18 aortic valve repair, PCCM consult  Interim History / Subjective:  Intubated s/p TAVR Reversed at beside    Objective    Blood pressure (!) 146/66, pulse 65, temperature 98.2 F (36.8 C), temperature source Oral, resp. rate 18, height 5' 2 (1.575 m), weight 93 kg, SpO2 96%.       No intake or output data in the 24 hours ending 07/05/24 0952 Filed Weights   07/05/24 0547  Weight: 93 kg    Examination: General: acute on chronic adult female, lying in icu bed on vent s/p TAVR HEENT: Normocephalic, PERRLA intact, ETT, OG  Pink MM CV: s1,s2, RRR, no MRG, No JVD, pacer wires, mediastinal chest tubes, sternotomy dressing intact  pulm: clear, diminished, no distress on vent  Abs: bs active, soft  Extremities: no edema, no deformity, moves all extremities  Skin: no rash  Neuro: Rass 1 to -1, responds to painful stimuli, cough gag  reflex present  GU: foley intact   Resolved problem list   Assessment and Plan  Severe aortic stenosis s/p TAVR-9/18 03/05/2024 echo-LVEF 60-65%, LV normal function, no regional wall motion abnormalities, mild left ventricular hypertrophy, grade 1 diastolic dysfunction, RV normal.  Aortic valve is bicuspid, severe aortic valve stenosis P: Postoperative management/Postoperative care per TCTS CT management per protocol- monitor output  ASA and BB per orders  Patient initially needing cleviprex  during case, however hypotensive when arrived to unit-SBP in 60s- restarted neo, neo 160 mcg push given by Anesthesia, albumin  x2, transitioning neo to levophed   Postoperative ancef /vanc/levaquin  for surgical ppx Wean insulin  gtt per protocol Ensure adequate pain control, tramadol , and oxycodone , morphine   Txa infusing finish  Give 1 AMP of bicarb for metabolic acidosis on ABG, recheck ABG within one hour  Replace electrolytes per protocol   Post op vent management Leukocytosis- patient difficult airway, needing decadron  10mg  IV, suspect elevated WBC reactive from surgery/steroids   P: Rapid wean protocol per TCTS Continue ventilator support with lung protective strategies  Wean PEEP and FiO2 for sats greater than 90%. Head of bed elevated 30 degrees. Plateau pressures less than 30 cm H20.  Follow intermittent chest x-ray and ABG.   Ensure adequate pulmonary hygiene  VAP bundle in place  PAD protocol Will need to ensure patient has positive cuff leak prior  to extubation   Expected perioperative blood loss anemia  Hgb 9.9  P:  Monitor h/h  Consider transfusion with hgb < 7   Hypertension PTA-on atenolol , amlodipine   P: Hold home antihypertensives in setting of pressors post-op   Asthma Sleep apnea on CPAP  PTA-albuterol  as needed, Singulair , Zyrtec  P: Once extubated, will need CPAP at night  Continue albuterol  prn  Restart Singulair , Zyrtec  on 9/19 if medically appropriate    Type 2 diabetes PTA-on insulin  NovoLog  FlexPen, [place] FlexPen, on Farxiga   Per records patient has freestyle libre Hgb A1c 8.9  P: On insulin  gtt, continue with Endotool, CBG goal 110-140  Will need SSI with long acting coverage during hospital stay  Recommend DM coordinator to follow, to help with blood sugar control during hospital stay, and recs for discharge due to A1C not at goal   GERD  PTA-on omeprazole P: Protonix  40 mg IV daily   Labs   CBC: Recent Labs  Lab 07/03/24 1103 07/05/24 0842 07/05/24 0845  WBC 10.0  --   --   HGB 12.9 12.2 12.6  HCT 40.7 36.0 37.0  MCV 87.7  --   --   PLT 362  --   --     Basic Metabolic Panel: Recent Labs  Lab 07/03/24 1103 07/05/24 0842 07/05/24 0845  NA 137 142 141  K 4.3 3.9 3.9  CL 107 107  --   CO2 21*  --   --   GLUCOSE 172* 166*  --   BUN 16 19  --   CREATININE 0.90 0.80  --   CALCIUM  9.1  --   --    GFR: Estimated Creatinine Clearance: 72.5 mL/min (by C-G formula based on SCr of 0.8 mg/dL). Recent Labs  Lab 07/03/24 1103  WBC 10.0    Liver Function Tests: Recent Labs  Lab 07/03/24 1103  AST 20  ALT 16  ALKPHOS 75  BILITOT <0.2  PROT 7.0  ALBUMIN  3.6   No results for input(s): LIPASE, AMYLASE in the last 168 hours. No results for input(s): AMMONIA in the last 168 hours.  ABG    Component Value Date/Time   PHART 7.225 (L) 07/05/2024 0845   PCO2ART 58.7 (H) 07/05/2024 0845   PO2ART 68 (L) 07/05/2024 0845   HCO3 24.3 07/05/2024 0845   TCO2 26 07/05/2024 0845   ACIDBASEDEF 4.0 (H) 07/05/2024 0845   O2SAT 89 07/05/2024 0845     Coagulation Profile: Recent Labs  Lab 07/03/24 1103  INR 1.0    Cardiac Enzymes: No results for input(s): CKTOTAL, CKMB, CKMBINDEX, TROPONINI in the last 168 hours.  HbA1C: Hgb A1c MFr Bld  Date/Time Value Ref Range Status  07/03/2024 11:03 AM 8.9 (H) 4.8 - 5.6 % Final    Comment:    (NOTE) Diagnosis of Diabetes The following HbA1c ranges  recommended by the American Diabetes Association (ADA) may be used as an aid in the diagnosis of diabetes mellitus.  Hemoglobin             Suggested A1C NGSP%              Diagnosis  <5.7                   Non Diabetic  5.7-6.4                Pre-Diabetic  >6.4                   Diabetic  <7.0  Glycemic control for                       adults with diabetes.    01/24/2024 08:23 AM 9.0 (H) 4.6 - 6.5 % Final    Comment:    Glycemic Control Guidelines for People with Diabetes:Non Diabetic:  <6%Goal of Therapy: <7%Additional Action Suggested:  >8%     CBG: Recent Labs  Lab 07/03/24 1029 07/05/24 0553 07/05/24 0747  GLUCAP 178* 189* 144*    Review of Systems:   See HPI   Past Medical History:  She,  has a past medical history of Asthma, Diarrhea, Dyspnea, GERD (gastroesophageal reflux disease), Headache, Heart murmur, HOH (hard of hearing), Hypertension, Motion sickness, Sleep apnea, and Type 2 diabetes mellitus with hyperglycemia (HCC).   Surgical History:   Past Surgical History:  Procedure Laterality Date   ABDOMINAL HYSTERECTOMY  2006   BREAST BIOPSY Left 2005   benign   BREAST EXCISIONAL BIOPSY Left 2005?    -benign   CATARACT EXTRACTION W/PHACO Right 11/30/2022   Procedure: CATARACT EXTRACTION PHACO AND INTRAOCULAR LENS PLACEMENT (IOC) RIGHT DIABETIC;  Surgeon: Jaye Fallow, MD;  Location: [facility];  Service: Ophthalmology;  Laterality: Right;  4.96 0:37.3   CATARACT EXTRACTION W/PHACO Left 12/14/2022   Procedure: CATARACT EXTRACTION PHACO AND INTRAOCULAR LENS PLACEMENT (IOC) LEFT DIABETIC;  Surgeon: Jaye Fallow, MD;  Location: [facility];  Service: Ophthalmology;  Laterality: Left;  5.98 0:40.6   COLONOSCOPY WITH PROPOFOL  N/A 08/13/2016   Procedure: COLONOSCOPY WITH PROPOFOL ;  Surgeon: Rogelia Copping, MD;  Location: [facility];  Service: Endoscopy;  Laterality: N/A;   COLONOSCOPY WITH PROPOFOL  N/A 02/09/2022    Procedure: COLONOSCOPY WITH PROPOFOL ;  Surgeon: Copping Rogelia, MD;  Location: [facility] END[facility]COPY;  Service: Endoscopy;  Laterality: N/A;   POLYPECTOMY  08/13/2016   Procedure: POLYPECTOMY;  Surgeon: Rogelia Copping, MD;  Location: [facility];  Service: Endoscopy;;   RIGHT HEART CATH AND CORONARY ANGIOGRAPHY N/A 03/26/2024   Procedure: RIGHT HEART CATH AND CORONARY ANGIOGRAPHY;  Surgeon: Darron Deatrice LABOR, MD;  Location: [facility] INVASIVE CV LAB;  Service: Cardiovascular;  Laterality: N/A;     Social History:   reports that she has never smoked. She has never used smokeless tobacco. She reports that she does not drink alcohol and does not use drugs.   Family History:  Her family history includes Breast cancer in her sister; Diabetes in her mother; Diverticulitis in her brother and sister; Heart attack in her mother; Heart disease in her mother; Heart failure in her father; Sleep apnea in her brother.   Allergies Allergies  Allergen Reactions   Amoxicillin-Pot Clavulanate Other (See Comments)    Deathly sick per patient   Levsin  [Hyoscyamine]     Dizziness   Ozempic  (0.25 Or 0.5 Mg-Dose) [Semaglutide (0.25 Or 0.5mg -Dos)] Other (See Comments)    pancreatitis   Shellfish Allergy Diarrhea and Nausea And Vomiting    dizziness   Metformin  And Related Diarrhea     Home Medications  Prior to Admission medications   Medication Sig Start Date End Date Taking? Authorizing Provider  amLODipine -benazepril  (LOTREL) 5-20 MG capsule Take 1 capsule by mouth once daily 06/13/24  Yes Drubel, Manuelita, PA-C  atenolol  (TENORMIN ) 50 MG tablet Take 1 tablet by mouth once daily 06/13/24  Yes Drubel, Manuelita, PA-C  cetirizine  (ZYRTEC ) 10 MG tablet Take 1 tablet by mouth once daily 05/28/24  Yes Drubel, Manuelita, PA-C  dapagliflozin  propanediol (FARXIGA ) 10 MG TABS  tablet Take 1 tablet (10 mg total) by mouth daily. For 2025 [place] Patient Assistance Program 05/07/24  Yes Cyndi Shaver, PA-C  ezetimibe  (ZETIA ) 10 MG  tablet Take 1 tablet (10 mg total) by mouth daily. 04/12/24  Yes Cyndi Shaver, PA-C  insulin  aspart (NOVOLOG ) 100 UNIT/ML FlexPen Inject 4 to 10 units up to 3 times a day with a meal. Patient taking differently: Inject 10 Units into the skin 2 (two) times daily with a meal. 05/17/24  Yes Drubel, Shaver, PA-C  insulin  degludec ([name] FLEXTOUCH) 100 UNIT/ML FlexTouch Pen Inject 23 Units into the skin 2 (two) times daily. Patient taking differently: Inject 25 Units into the skin 2 (two) times daily.   Yes [provider]  montelukast  (SINGULAIR ) 10 MG tablet TAKE 1 TABLET BY MOUTH AT BEDTIME 05/28/24  Yes Drubel, Shaver, PA-C  omeprazole (PRILOSEC OTC) 20 MG tablet Take 20 mg by mouth daily.   Yes [provider]  albuterol  (VENTOLIN  HFA) 108 (90 Base) MCG/ACT inhaler Inhale 2 puffs into the lungs every 6 (six) hours as needed for wheezing or shortness of breath. 07/22/22   Emilio Kelly DASEN, FNP  Blood Glucose Monitoring Suppl (CONTOUR NEXT USB MONITOR) w/Device KIT 1 kit by Does not apply route daily. To check blood sugar once daily. 10/03/19   Vivienne Delon HERO, PA-C  CareTouch Safety Lancets 26G MISC 1 Device by Does not apply route daily. To check blood sugar once daily. 10/03/19   Vivienne Delon HERO, PA-C  chlorhexidine  (PERIDEX ) 0.12 % solution Use as directed 15 mLs in the mouth or throat daily. 05/03/24   [provider]  Continuous Glucose Sensor (FREESTYLE LIBRE 3 PLUS SENSOR) MISC USE AS DIRECTED. CHANGE SENSOR EVERY 15 DAYS. 01/26/24   Cyndi Shaver, PA-C  glucose blood (CONTOUR NEXT TEST) test strip To check blood sugar once daily. 11/19/20   Vivienne Delon HERO, PA-C  Insulin  Pen Needle (NOVOFINE PLUS) 32G X 4 MM MISC To use with ozempic  injections 01/31/19   Vivienne Delon HERO, PA-C     Critical care time: 50 mins    Sherlean Sharps AGACNP-BC   [facility] 07/05/2024, 12:27 PM  Please see [email or web address] for pager details.  From  7A-7P if no response, please call [phone]. After hours, please call ELink [phone].

## 2024-07-05 NOTE — Anesthesia Postprocedure Evaluation (Signed)
 Anesthesia Post Note  Patient: Shari Lee  Procedure(s) Performed: REPLACEMENT, AORTIC VALVE, OPEN USING INSPIRIS VALVE (Chest) ECHOCARDIOGRAM, TRANSESOPHAGEAL     Patient location during evaluation: ICU Anesthesia Type: General Level of consciousness: sedated Pain management: pain level controlled Vital Signs Assessment: post-procedure vital signs reviewed and stable Respiratory status: patient remains intubated per anesthesia plan Cardiovascular status: stable Postop Assessment: no apparent nausea or vomiting Anesthetic complications: no   No notable events documented.  Last Vitals:  Vitals:   07/05/24 1501 07/05/24 1502  BP:    Pulse: 74 75  Resp: (!) 23 (!) 21  Temp: 36.7 C 36.8 C  SpO2: 100% 99%    Last Pain:  Vitals:   07/05/24 0600  TempSrc:   PainSc: 0-No pain                 Banjamin Stovall

## 2024-07-05 NOTE — Anesthesia Preprocedure Evaluation (Signed)
 Anesthesia Evaluation  Patient identified by MRN, date of birth, ID band Patient awake    Reviewed: Allergy & Precautions, NPO status , Patient's Chart, lab work & pertinent test results  History of Anesthesia Complications Negative for: history of anesthetic complications  Airway Mallampati: III  TM Distance: >3 FB Neck ROM: Full    Dental  (+) Teeth Intact, Dental Advisory Given   Pulmonary shortness of breath, asthma , sleep apnea    breath sounds clear to auscultation       Cardiovascular hypertension, Pt. on medications (-) angina (-) Past MI and (-) CHF + Valvular Problems/Murmurs AS  Rhythm:Regular + Systolic murmurs  1. Left ventricular ejection fraction, by estimation, is 60 to 65%. Left  ventricular ejection fraction by PLAX is 63 %. The left ventricle has  normal function. The left ventricle has no regional wall motion  abnormalities. There is mild left ventricular  hypertrophy. Left ventricular diastolic parameters are consistent with  Grade I diastolic dysfunction (impaired relaxation).   2. Right ventricular systolic function is normal. The right ventricular  size is normal.   3. The mitral valve is grossly normal. No evidence of mitral valve  regurgitation.   4. The aortic valve is bicuspid. Aortic valve regurgitation is not  visualized. Severe aortic valve stenosis. Aortic valve area, by VTI  measures 0.58 cm. Aortic valve mean gradient measures 48.0 mmHg. Aortic  valve Vmax measures 4.92 m/s.   5. The inferior vena cava is normal in size with greater than 50%  respiratory variability, suggesting right atrial pressure of 3 mmHg.      Prox LAD lesion is 10% stenosed.   1.  Minor irregularities with no evidence of obstructive coronary artery disease. 2.  Severe aortic stenosis by echo.  I did not attempt to cross the aortic valve. 3.  Right heart catheterization showed normal right and left-sided filling  pressures, normal pulmonary pressure and normal cardiac output.   Recommendations: Refer to the structural heart clinic for evaluation of bicuspid aortic valve replacement.     Neuro/Psych  Headaches, neg Seizures  negative psych ROS   GI/Hepatic Neg liver ROS,GERD  Controlled,,  Endo/Other  diabetes, Insulin  Dependent    Renal/GU Lab Results      Component                Value               Date                      NA                       137                 07/03/2024                K                        4.3                 07/03/2024                CO2                      21 (L)              07/03/2024  GLUCOSE                  172 (H)             07/03/2024                BUN                      16                  07/03/2024                CREATININE               0.90                07/03/2024                CALCIUM                   9.1                 07/03/2024                GFR                      64.63               01/24/2024                EGFR                     65                  04/10/2024                GFRNONAA                 >60                 07/03/2024                Musculoskeletal   Abdominal   Peds  Hematology   Anesthesia Other Findings   Reproductive/Obstetrics                              Anesthesia Physical Anesthesia Plan  ASA: 4  Anesthesia Plan: General   Post-op Pain Management:    Induction: Intravenous  PONV Risk Score and Plan: 3 and Ondansetron   Airway Management Planned: Oral ETT  Additional Equipment: Arterial line, TEE and Ultrasound Guidance Line Placement  Intra-op Plan:   Post-operative Plan: Post-operative intubation/ventilation  Informed Consent: I have reviewed the patients History and Physical, chart, labs and discussed the procedure including the risks, benefits and alternatives for the proposed anesthesia with the patient or authorized representative who has  indicated his/her understanding and acceptance.     Dental advisory given  Plan Discussed with: CRNA  Anesthesia Plan Comments:         Anesthesia Quick Evaluation

## 2024-07-05 NOTE — Hospital Course (Addendum)
  Referring: Cyndi Shaver, PA-C Primary Care: Cyndi Shaver, PA-C Primary Cardiologist:Brian Agbor-Etang, MD     History of Present Illness:    At time of CT surgical evaluation Shari Lee is a 68 y.o. female presents for surgical evaluation of severe aortic stenosis.  She admits to occasional chest pain.  She regularly has shortness of breath, and denies any syncopal episodes, but does have some dizziness.  She has an STS score 1.99 and a NYHA class II.  She has undergone full diagnostic evaluation.  He has been found to have a bicuspid aortic valve.  Following full review of the patient and all relevant studies Dr. Shyrl recommended proceeding with elective aortic valve replacement.  Hospital course: The patient was admitted electively and on 07/05/2024 she was taken to the operating room at which time she underwent aortic valve replacement with a #21 Inspiris Resilia bioprosthetic valve.  She tolerated the procedure well and was taken to the surgical intensive care unit in stable condition.   Postoperative hospital course:  Patient has done well.  He was extubated from the ventilator using standard postcardiac surgical protocols without difficulty.  She is neurologically intact.  Pain has been under adequate control.  She did develop postoperative atrial fibrillation and was subsequently chemically cardioverted with amiodarone .  All routine lines, monitors and drainage devices have been discontinued in the standard fashion.  She does have expected acute blood loss anemia which is stabilized.  She has maintained normal renal function.  She does have a reactive leukocytosis and is being monitored clinically.

## 2024-07-05 NOTE — Progress Notes (Signed)
 Post-Cardiothoracic Surgery Insulin  Management  Shari Lee is a 68 y.o. female scheduled for a surgical aortic valve replacement on 07/05/24  with Dr. Shyrl. Patient has an A1C >6.5% prior to procedure and a history of type 2 diabetes mellitus. Pharmacy will be available to assist with transition off of the insulin  infusion.   Lab Results  Component Value Date   HGBA1C 8.9 (H) 07/03/2024   HGBA1C 9.0 (H) 01/24/2024   HGBA1C 9.7 (H) 10/20/2023    Morna Breach, PharmD PGY2 Cardiology Pharmacy Resident 07/05/2024 1:20 PM

## 2024-07-05 NOTE — Interval H&P Note (Signed)
 History and Physical Interval Note:  07/05/2024 7:20 AM  Shari Lee  has presented today for surgery, with the diagnosis of AS.  The various methods of treatment have been discussed with the patient and family. After consideration of risks, benefits and other options for treatment, the patient has consented to  Procedure(s): REPLACEMENT, AORTIC VALVE, OPEN (N/A) ECHOCARDIOGRAM, TRANSESOPHAGEAL (N/A) as a surgical intervention.  The patient's history has been reviewed, patient examined, no change in status, stable for surgery.  I have reviewed the patient's chart and labs.  Questions were answered to the patient's satisfaction.     [name] [name]

## 2024-07-06 ENCOUNTER — Other Ambulatory Visit: Payer: Self-pay | Admitting: *Deleted

## 2024-07-06 ENCOUNTER — Encounter (HOSPITAL_COMMUNITY): Payer: Self-pay | Admitting: Thoracic Surgery (Cardiothoracic Vascular Surgery)

## 2024-07-06 ENCOUNTER — Inpatient Hospital Stay (HOSPITAL_COMMUNITY)

## 2024-07-06 ENCOUNTER — Other Ambulatory Visit: Payer: Self-pay | Admitting: Student

## 2024-07-06 DIAGNOSIS — D72829 Elevated white blood cell count, unspecified: Secondary | ICD-10-CM | POA: Diagnosis not present

## 2024-07-06 DIAGNOSIS — I350 Nonrheumatic aortic (valve) stenosis: Secondary | ICD-10-CM

## 2024-07-06 DIAGNOSIS — E1165 Type 2 diabetes mellitus with hyperglycemia: Secondary | ICD-10-CM

## 2024-07-06 DIAGNOSIS — G4733 Obstructive sleep apnea (adult) (pediatric): Secondary | ICD-10-CM | POA: Diagnosis not present

## 2024-07-06 DIAGNOSIS — I10 Essential (primary) hypertension: Secondary | ICD-10-CM | POA: Diagnosis not present

## 2024-07-06 DIAGNOSIS — Z952 Presence of prosthetic heart valve: Secondary | ICD-10-CM

## 2024-07-06 LAB — CBC
HCT: 29 % — ABNORMAL LOW (ref 36.0–46.0)
HCT: 30.7 % — ABNORMAL LOW (ref 36.0–46.0)
Hemoglobin: 9.3 g/dL — ABNORMAL LOW (ref 12.0–15.0)
Hemoglobin: 9.8 g/dL — ABNORMAL LOW (ref 12.0–15.0)
MCH: 28.1 pg (ref 26.0–34.0)
MCH: 28.2 pg (ref 26.0–34.0)
MCHC: 32.1 g/dL (ref 30.0–36.0)
MCHC: 31.9 g/dL (ref 30.0–36.0)
MCV: 87.6 fL (ref 80.0–100.0)
MCV: 88.2 fL (ref 80.0–100.0)
Platelets: 189 K/uL (ref 150–400)
Platelets: 208 K/uL (ref 150–400)
RBC: 3.31 MIL/uL — ABNORMAL LOW (ref 3.87–5.11)
RBC: 3.48 MIL/uL — ABNORMAL LOW (ref 3.87–5.11)
RDW: 14.5 % (ref 11.5–15.5)
RDW: 14.8 % (ref 11.5–15.5)
WBC: 19.1 K/uL — ABNORMAL HIGH (ref 4.0–10.5)
WBC: 24 K/uL — ABNORMAL HIGH (ref 4.0–10.5)
nRBC: 0 % (ref 0.0–0.2)
nRBC: 0 % (ref 0.0–0.2)

## 2024-07-06 LAB — BASIC METABOLIC PANEL WITH GFR
Anion gap: 6 (ref 5–15)
Anion gap: 9 (ref 5–15)
BUN: 18 mg/dL (ref 8–23)
BUN: 18 mg/dL (ref 8–23)
CO2: 23 mmol/L (ref 22–32)
CO2: 25 mmol/L (ref 22–32)
Calcium: 8.1 mg/dL — ABNORMAL LOW (ref 8.9–10.3)
Calcium: 8.5 mg/dL — ABNORMAL LOW (ref 8.9–10.3)
Chloride: 109 mmol/L (ref 98–111)
Chloride: 101 mmol/L (ref 98–111)
Creatinine, Ser: 0.86 mg/dL (ref 0.44–1.00)
Creatinine, Ser: 0.94 mg/dL (ref 0.44–1.00)
GFR, Estimated: >60 mL/min (ref >60)
GFR, Estimated: >60 mL/min (ref >60)
Glucose, Bld: 113 mg/dL — ABNORMAL HIGH (ref 70–99)
Glucose, Bld: 185 mg/dL — ABNORMAL HIGH (ref 70–99)
Potassium: 4.4 mmol/L (ref 3.5–5.1)
Potassium: 4.1 mmol/L (ref 3.5–5.1)
Sodium: 138 mmol/L (ref 135–145)
Sodium: 135 mmol/L (ref 135–145)

## 2024-07-06 LAB — GLUCOSE, CAPILLARY
Glucose-Capillary: 123 mg/dL — ABNORMAL HIGH (ref 70–99)
Glucose-Capillary: 120 mg/dL — ABNORMAL HIGH (ref 70–99)
Glucose-Capillary: 102 mg/dL — ABNORMAL HIGH (ref 70–99)
Glucose-Capillary: 112 mg/dL — ABNORMAL HIGH (ref 70–99)
Glucose-Capillary: 117 mg/dL — ABNORMAL HIGH (ref 70–99)
Glucose-Capillary: 111 mg/dL — ABNORMAL HIGH (ref 70–99)
Glucose-Capillary: 118 mg/dL — ABNORMAL HIGH (ref 70–99)
Glucose-Capillary: 147 mg/dL — ABNORMAL HIGH (ref 70–99)
Glucose-Capillary: 216 mg/dL — ABNORMAL HIGH (ref 70–99)
Glucose-Capillary: 229 mg/dL — ABNORMAL HIGH (ref 70–99)
Glucose-Capillary: 169 mg/dL — ABNORMAL HIGH (ref 70–99)
Glucose-Capillary: 158 mg/dL — ABNORMAL HIGH (ref 70–99)
Glucose-Capillary: 177 mg/dL — ABNORMAL HIGH (ref 70–99)
Glucose-Capillary: 142 mg/dL — ABNORMAL HIGH (ref 70–99)

## 2024-07-06 LAB — MAGNESIUM
Magnesium: 2.7 mg/dL — ABNORMAL HIGH (ref 1.7–2.4)
Magnesium: 2.2 mg/dL (ref 1.7–2.4)

## 2024-07-06 LAB — SURGICAL PATHOLOGY

## 2024-07-06 MED ORDER — LORATADINE 10 MG PO TABS
10.0000 mg | ORAL_TABLET | Freq: Every day | ORAL | Status: DC
  Administered 2024-07-06 – 2024-07-11 (×6): 10 mg via ORAL
  Filled 2024-07-06 (×6): qty 1

## 2024-07-06 MED ORDER — MONTELUKAST SODIUM 10 MG PO TABS
10.0000 mg | ORAL_TABLET | Freq: Every day | ORAL | Status: DC
  Administered 2024-07-06 – 2024-07-10 (×5): 10 mg via ORAL
  Filled 2024-07-06 (×5): qty 1

## 2024-07-06 MED ORDER — INSULIN GLARGINE 100 UNIT/ML ~~LOC~~ SOLN
14.0000 [IU] | Freq: Every day | SUBCUTANEOUS | Status: DC
  Administered 2024-07-06 – 2024-07-11 (×6): 14 [IU] via SUBCUTANEOUS
  Filled 2024-07-06 (×6): qty 0.14

## 2024-07-06 MED ORDER — FUROSEMIDE 10 MG/ML IJ SOLN
40.0000 mg | Freq: Once | INTRAMUSCULAR | Status: AC
  Administered 2024-07-06: 40 mg via INTRAVENOUS
  Filled 2024-07-06: qty 4

## 2024-07-06 MED ORDER — INSULIN ASPART 100 UNIT/ML IJ SOLN
0.0000 [IU] | INTRAMUSCULAR | Status: DC
  Administered 2024-07-06: 4 [IU] via SUBCUTANEOUS
  Administered 2024-07-06 (×2): 2 [IU] via SUBCUTANEOUS
  Administered 2024-07-06: 4 [IU] via SUBCUTANEOUS
  Administered 2024-07-07 (×2): 2 [IU] via SUBCUTANEOUS

## 2024-07-06 NOTE — Plan of Care (Signed)

## 2024-07-06 NOTE — Progress Notes (Signed)
      [address]       Shari Lee CHILD [id number]             [phone]                 1 Day Post-Op Procedure(s) (LRB): REPLACEMENT, AORTIC VALVE, OPEN USING INSPIRIS VALVE (N/A) ECHOCARDIOGRAM, TRANSESOPHAGEAL (N/A)   Events: No event _______________________________________________________________ Vitals: BP (!) 154/65   Pulse 72   Temp 99.5 F (37.5 C)   Resp 15   Ht 5' 2 (1.575 m)   Wt 97.8 kg   SpO2 92%   BMI 39.44 kg/m  Filed Weights   07/05/24 0547 07/06/24 0600  Weight: 93 kg 97.8 kg     - Neuro: alert NAD  - Cardiovascular: sinus  Drips: none.   CVP:  [0 mmHg-73 mmHg] 5 mmHg CO:  [4.1 L/min-8.2 L/min] 5.1 L/min CI:  [2.1 L/min/m2-4.3 L/min/m2] 2.6 L/min/m2  - Pulm: EWOB  ABG    Component Value Date/Time   PHART 7.271 (L) 07/05/2024 1701   PCO2ART 42.0 07/05/2024 1701   PO2ART 71 (L) 07/05/2024 1701   HCO3 19.3 (L) 07/05/2024 1701   TCO2 21 (L) 07/05/2024 1701   ACIDBASEDEF 7.0 (H) 07/05/2024 1701   O2SAT 91 07/05/2024 1701    - Abd: ND - Extremity: warm  .Intake/Output      09/18 0701 09/19 0700 09/19 0701 09/20 0700   I.V. (mL/kg) 2108.5 (21.6) 113.9 (1.2)   Blood 350    IV Piggyback 2156.5    Total Intake(mL/kg) 4615 (47.2) 113.9 (1.2)   Urine (mL/kg/hr) 1890 (0.8)    Chest Tube 250    Total Output 2140    Net +2475 +113.9           _______________________________________________________________ Labs:    Latest Ref Rng & Units 07/06/2024    3:20 AM 07/05/2024    7:34 PM 07/05/2024    5:01 PM  CBC  WBC 4.0 - 10.5 K/uL 19.1  17.6    Hemoglobin 12.0 - 15.0 g/dL 9.3  9.4  8.8   Hematocrit 36.0 - 46.0 % 29.0  29.2  26.0   Platelets 150 - 400 K/uL 189  181        Latest Ref Rng & Units 07/06/2024    3:20 AM 07/05/2024    7:34 PM 07/05/2024    5:01 PM  CMP  Glucose 70 - 99 mg/dL 886  837    BUN 8 - 23 mg/dL 18  16    Creatinine 9.55 - 1.00 mg/dL 9.13  9.17    Sodium 864 - 145 mmol/L 138  139  144    Potassium 3.5 - 5.1 mmol/L 4.4  4.2  4.2   Chloride 98 - 111 mmol/L 109  107    CO2 22 - 32 mmol/L 23  23    Calcium  8.9 - 10.3 mg/dL 8.1  8.3      CXR: PV congestion  _______________________________________________________________  Assessment and Plan: POD 1 s/p bAVR  Neuro: pain controlled CV: will keep wires.  Will remove A-line Pulm: IS, ambulation Renal: creat stable.  Will diurese GI: on diet Heme: stable ID: [email or web address] Endo: SSI Dispo: continue ICU care   Shari Lee 07/06/2024 12:04 PM

## 2024-07-06 NOTE — Inpatient Diabetes Management (Addendum)
 Inpatient Diabetes Program Recommendations  AACE/ADA: New Consensus Statement on Inpatient Glycemic Control   Target Ranges:  Prepandial:   less than 140 mg/dL      Peak postprandial:   less than 180 mg/dL (1-2 hours)      Critically ill patients:  140 - 180 mg/dL    Latest Reference Range & Units 07/03/24 11:03  Hemoglobin A1C 4.8 - 5.6 % 8.9 (H)    Latest Reference Range & Units 07/06/24 01:16 07/06/24 03:19 07/06/24 05:17 07/06/24 06:21 07/06/24 07:27 07/06/24 08:29 07/06/24 09:25  Glucose-Capillary 70 - 99 mg/dL 876 (H) 879 (H) 897 (H) 112 (H) 117 (H) 111 (H) 118 (H)    Review of Glycemic Control  Diabetes history: DM2 Outpatient Diabetes medications: Farxigta 10mg  daily, Novolog  10mg  TID, [name] 25 units BID, FreeStyle Libre3  Current orders for Inpatient glycemic control: Transitioned from Insulin  gtt at 0727 this morning to Lantus  14 units daily and Novolog  0-24 units Q4hrs.   Inpatient Diabetes Program Recommendations: Agree with current insulin  regimen. Diabetes Coordinator will continue to follow.  07/06/24 @ 1351 NOTE: CBG increased since transitioning off IV insulin . Please consider increasing Lantus  to 20 units daily.   Thanks,  Lavanda Search, RN, MSN, [facility]  Inpatient Diabetes Coordinator  Pager [phone] (8a-5p)

## 2024-07-06 NOTE — Progress Notes (Signed)
 NAME:  Shari Lee, MRN:  [id number], DOB:  1956-04-13, LOS: 1 ADMISSION DATE:  07/05/2024, CONSULTATION DATE:  07/05/24 REFERRING MD:  MD Lightfoot CHIEF COMPLAINT:  Severe Aortic Stenosis/Elective AVR  History of Present Illness:  Pt is a 68 yr old female with significant past medical history of severe aortic stenosis, hypertension, asthma, sleep apnea, type 2 diabetes, and GERD presents for elective aortic valve repair by MD Lightfoot.  PCCM consulted for postop vent management as well as assisting with care during ICU stay.  Pertinent  Medical History   Past Medical History:  Diagnosis Date   Asthma    mild   Diarrhea    N/V recently   Dyspnea    GERD (gastroesophageal reflux disease)    Headache    Heart murmur    developed in last couple years/ no issues   HOH (hard of hearing)    wears aids   Hypertension    Motion sickness    car   Sleep apnea    Type 2 diabetes mellitus with hyperglycemia (HCC)      Significant Hospital Events: Including procedures, antibiotic start and stop dates in addition to other pertinent events   9/18 aortic valve repair, PCCM consult 9/18 extubated to 3 L/min   Interim History / Subjective:  I/O+ 2.4 L total, chest tube output to 50 cc/24H, UOP 1890 cc/24H WBC 19.1    Objective    Blood pressure (!) 107/56, pulse 65, temperature 99.5 F (37.5 C), resp. rate (!) 22, height 5' 2 (1.575 m), weight 97.8 kg, SpO2 (!) 89%. CVP:  [0 mmHg-73 mmHg] 5 mmHg CO:  [4.1 L/min-8.2 L/min] 5.4 L/min CI:  [2.1 L/min/m2-4.3 L/min/m2] 2.8 L/min/m2  Vent Mode: PSV;CPAP FiO2 (%):  [40 %-50 %] 40 % Set Rate:  [4 bmp-16 bmp] 4 bmp Vt Set:  [400 mL] 400 mL PEEP:  [5 cmH20] 5 cmH20 Pressure Support:  [10 cmH20] 10 cmH20   Intake/Output Summary (Last 24 hours) at 07/06/2024 0724 Last data filed at 07/06/2024 0600 Gross per 24 hour  Intake 4583.11 ml  Output 2140 ml  Net 2443.11 ml   Filed Weights   07/05/24 0547 07/06/24 0600  Weight: 93 kg  97.8 kg    Examination: General: Obese woman, comfortable in bed, awake HEENT: Oropharynx clear, strong voice, no stridor, no secretions CV: Sternotomy wound clean and dry, pacer wires are still in Regular 65, distant but 2/6 systolic murmur pulm: Decreased to both bases, otherwise clear Abs: Obese, nondistended with hypoactive bowel sounds Extremities: No significant edema Skin: No rash Neuro: Awake and alert, interacting appropriately, follows commands, moves all extremities GU: Foley catheter in place  Resolved problem list   Assessment and Plan  Severe aortic stenosis s/p TAVR-9/18 03/05/2024 echo-LVEF 60-65%, LV normal function, no regional wall motion abnormalities, mild left ventricular hypertrophy, grade 1 diastolic dysfunction, RV normal.  Aortic valve is bicuspid, severe aortic valve stenosis P: -Postoperative management per TCTS - Chest tube per protocol, output 250 cc/24H - Beta-blockade, aspirin  as ordered - Norepinephrine  weaned to off, follow hemodynamics - Complete postop/prophylactic antibiotics - Pain control - Following urine output, BMP   Post op vent management Leukocytosis- patient difficult airway, needing decadron  10mg  IV, suspect elevated WBC reactive from surgery/steroids   P: - Extubated successfully, push pulmonary hygiene - Follow intermittent chest x-ray, chest tube output - ABG if any evidence of encephalopathy, metabolic acidosis - CPAP as below - Albuterol  as below  Expected perioperative blood loss anemia  Hgb 9.9  P:  -Following CBC - Transfusion threshold hemoglobin 7.0  Monitor h/h  Consider transfusion with hgb < 7   Hypertension PTA-on atenolol , amlodipine   P: -Reinitiate home antihypertensive regimen with blood pressure will tolerate  Asthma Sleep apnea > has CPAP but poor compliance PTA-albuterol  as needed, Singulair , Zyrtec  P: - defer CPA for now, she does not use at home. If evidence that her OSA is impacting recovery  then may need to push for her to wear - Pulmonary hygiene - Albuterol  if needed - Singulair , Zyrtec  when able to take p.o.  Type 2 diabetes with hyperglycemia PTA-on insulin  NovoLog  FlexPen, [place] FlexPen, on Farxiga   Per records patient has freestyle libre Hgb A1c 8.9  P: -Continue wean insulin  infusion, CBG goal 110-140 - Transition to sliding scale insulin  when able, will need long-acting insulin  during the hospitalization - Diabetes coordinator to help with home regimen  GERD  PTA-on omeprazole P: -Protonix  40 mg IV daily until take good p.o.  Labs   CBC: Recent Labs  Lab 07/03/24 1103 07/05/24 0842 07/05/24 1059 07/05/24 1314 07/05/24 1315 07/05/24 1517 07/05/24 1552 07/05/24 1701 07/05/24 1934 07/06/24 0320  WBC 10.0  --   --  27.2*  --   --   --   --  17.6* 19.1*  HGB 12.9   < > 9.1* 9.9*   < > 9.9* 8.5* 8.8* 9.4* 9.3*  HCT 40.7   < > 28.7* 30.8*   < > 29.0* 25.0* 26.0* 29.2* 29.0*  MCV 87.7  --   --  88.5  --   --   --   --  87.7 87.6  PLT 362  --  255 203  --   --   --   --  181 189   < > = values in this interval not displayed.    Basic Metabolic Panel: Recent Labs  Lab 07/03/24 1103 07/05/24 0842 07/05/24 0845 07/05/24 0938 07/05/24 0956 07/05/24 1003 07/05/24 1032 07/05/24 1517 07/05/24 1552 07/05/24 1701 07/05/24 1934 07/06/24 0320  NA 137 142   < > 141   < > 139   < > 143 144 144 139 138  K 4.3 3.9   < > 3.6   < > 4.8   < > 4.2 3.8 4.2 4.2 4.4  CL 107 107  --  109  --  105  --   --   --   --  107 109  CO2 21*  --   --   --   --   --   --   --   --   --  23 23  GLUCOSE 172* 166*  --  158*  --  143*  --   --   --   --  162* 113*  BUN 16 19  --  17  --  18  --   --   --   --  16 18  CREATININE 0.90 0.80  --  0.80  --  0.70  --   --   --   --  0.82 0.86  CALCIUM  9.1  --   --   --   --   --   --   --   --   --  8.3* 8.1*  MG  --   --   --   --   --   --   --   --   --   --  3.0* 2.7*   < > =  values in this interval not displayed.    GFR: Estimated Creatinine Clearance: 69.3 mL/min (by C-G formula based on SCr of 0.86 mg/dL). Recent Labs  Lab 07/03/24 1103 07/05/24 1314 07/05/24 1934 07/06/24 0320  WBC 10.0 27.2* 17.6* 19.1*    Liver Function Tests: Recent Labs  Lab 07/03/24 1103  AST 20  ALT 16  ALKPHOS 75  BILITOT <0.2  PROT 7.0  ALBUMIN  3.6   No results for input(s): LIPASE, AMYLASE in the last 168 hours. No results for input(s): AMMONIA in the last 168 hours.  ABG    Component Value Date/Time   PHART 7.271 (L) 07/05/2024 1701   PCO2ART 42.0 07/05/2024 1701   PO2ART 71 (L) 07/05/2024 1701   HCO3 19.3 (L) 07/05/2024 1701   TCO2 21 (L) 07/05/2024 1701   ACIDBASEDEF 7.0 (H) 07/05/2024 1701   O2SAT 91 07/05/2024 1701     Coagulation Profile: Recent Labs  Lab 07/03/24 1103 07/05/24 1314  INR 1.0 1.5*    Cardiac Enzymes: No results for input(s): CKTOTAL, CKMB, CKMBINDEX, TROPONINI in the last 168 hours.  HbA1C: Hgb A1c MFr Bld  Date/Time Value Ref Range Status  07/03/2024 11:03 AM 8.9 (H) 4.8 - 5.6 % Final    Comment:    (NOTE) Diagnosis of Diabetes The following HbA1c ranges recommended by the American Diabetes Association (ADA) may be used as an aid in the diagnosis of diabetes mellitus.  Hemoglobin             Suggested A1C NGSP%              Diagnosis  <5.7                   Non Diabetic  5.7-6.4                Pre-Diabetic  >6.4                   Diabetic  <7.0                   Glycemic control for                       adults with diabetes.    01/24/2024 08:23 AM 9.0 (H) 4.6 - 6.5 % Final    Comment:    Glycemic Control Guidelines for People with Diabetes:Non Diabetic:  <6%Goal of Therapy: <7%Additional Action Suggested:  >8%     CBG: Recent Labs  Lab 07/05/24 2311 07/06/24 0116 07/06/24 0319 07/06/24 0517 07/06/24 0621  GLUCAP 126* 123* 120* 102* 112*     Critical care time: 31 minutes    Lamar Chris, MD, PhD 07/06/2024, 7:25  AM [facility] [phone] or if no answer before 7:00PM call [phone] For any issues after 7:00PM please call eLink [phone]

## 2024-07-06 NOTE — Progress Notes (Signed)
 Ordered 6 week post-op Echo following AVR on 07/05/2024 per protocol. Primary Cardiologist = Dr. Darliss. CT Surgeon = Dr. Lightfoot  Shari Lamadrid E Brylea Pita, PA-C 07/06/2024 1:38 PM

## 2024-07-07 ENCOUNTER — Inpatient Hospital Stay (HOSPITAL_COMMUNITY)

## 2024-07-07 DIAGNOSIS — D72829 Elevated white blood cell count, unspecified: Secondary | ICD-10-CM | POA: Diagnosis not present

## 2024-07-07 DIAGNOSIS — I10 Essential (primary) hypertension: Secondary | ICD-10-CM | POA: Diagnosis not present

## 2024-07-07 DIAGNOSIS — G4733 Obstructive sleep apnea (adult) (pediatric): Secondary | ICD-10-CM | POA: Diagnosis not present

## 2024-07-07 DIAGNOSIS — Z952 Presence of prosthetic heart valve: Secondary | ICD-10-CM | POA: Diagnosis not present

## 2024-07-07 LAB — POCT I-STAT 7, (LYTES, BLD GAS, ICA,H+H)
Acid-base deficit: 2 mmol/L (ref 0.0–2.0)
Acid-base deficit: 4 mmol/L — ABNORMAL HIGH (ref 0.0–2.0)
Acid-base deficit: 6 mmol/L — ABNORMAL HIGH (ref 0.0–2.0)
Bicarbonate: 20.5 mmol/L (ref 20.0–28.0)
Bicarbonate: 21.9 mmol/L (ref 20.0–28.0)
Bicarbonate: 19.5 mmol/L — ABNORMAL LOW (ref 20.0–28.0)
Calcium, Ion: 1.13 mmol/L — ABNORMAL LOW (ref 1.15–1.40)
Calcium, Ion: 1.27 mmol/L (ref 1.15–1.40)
Calcium, Ion: 1.2 mmol/L (ref 1.15–1.40)
HCT: 29 % — ABNORMAL LOW (ref 36.0–46.0)
HCT: 29 % — ABNORMAL LOW (ref 36.0–46.0)
HCT: 26 % — ABNORMAL LOW (ref 36.0–46.0)
Hemoglobin: 9.9 g/dL — ABNORMAL LOW (ref 12.0–15.0)
Hemoglobin: 9.9 g/dL — ABNORMAL LOW (ref 12.0–15.0)
Hemoglobin: 8.8 g/dL — ABNORMAL LOW (ref 12.0–15.0)
O2 Saturation: 100 %
O2 Saturation: 100 %
O2 Saturation: 100 %
Potassium: 3.9 mmol/L (ref 3.5–5.1)
Potassium: 4.1 mmol/L (ref 3.5–5.1)
Potassium: 3.7 mmol/L (ref 3.5–5.1)
Sodium: 140 mmol/L (ref 135–145)
Sodium: 141 mmol/L (ref 135–145)
Sodium: 141 mmol/L (ref 135–145)
TCO2: 22 mmol/L (ref 22–32)
TCO2: 23 mmol/L (ref 22–32)
TCO2: 21 mmol/L — ABNORMAL LOW (ref 22–32)
pCO2 arterial: 33.8 mmHg (ref 32–48)
pCO2 arterial: 34.2 mmHg (ref 32–48)
pCO2 arterial: 34.9 mmHg (ref 32–48)
pH, Arterial: 7.391 (ref 7.35–7.45)
pH, Arterial: 7.415 (ref 7.35–7.45)
pH, Arterial: 7.354 (ref 7.35–7.45)
pO2, Arterial: 284 mmHg — ABNORMAL HIGH (ref 83–108)
pO2, Arterial: 339 mmHg — ABNORMAL HIGH (ref 83–108)
pO2, Arterial: 328 mmHg — ABNORMAL HIGH (ref 83–108)

## 2024-07-07 LAB — POCT I-STAT, CHEM 8
BUN: 19 mg/dL (ref 8–23)
Calcium, Ion: 1.3 mmol/L (ref 1.15–1.40)
Chloride: 109 mmol/L (ref 98–111)
Creatinine, Ser: 0.7 mg/dL (ref 0.44–1.00)
Glucose, Bld: 141 mg/dL — ABNORMAL HIGH (ref 70–99)
HCT: 23 % — ABNORMAL LOW (ref 36.0–46.0)
Hemoglobin: 7.8 g/dL — ABNORMAL LOW (ref 12.0–15.0)
Potassium: 3.9 mmol/L (ref 3.5–5.1)
Sodium: 141 mmol/L (ref 135–145)
TCO2: 24 mmol/L (ref 22–32)

## 2024-07-07 LAB — CBC
HCT: 30.1 % — ABNORMAL LOW (ref 36.0–46.0)
Hemoglobin: 9.5 g/dL — ABNORMAL LOW (ref 12.0–15.0)
MCH: 28 pg (ref 26.0–34.0)
MCHC: 31.6 g/dL (ref 30.0–36.0)
MCV: 88.8 fL (ref 80.0–100.0)
Platelets: 198 K/uL (ref 150–400)
RBC: 3.39 MIL/uL — ABNORMAL LOW (ref 3.87–5.11)
RDW: 14.6 % (ref 11.5–15.5)
WBC: 21.3 K/uL — ABNORMAL HIGH (ref 4.0–10.5)
nRBC: 0 % (ref 0.0–0.2)

## 2024-07-07 LAB — GLUCOSE, CAPILLARY
Glucose-Capillary: 144 mg/dL — ABNORMAL HIGH (ref 70–99)
Glucose-Capillary: 147 mg/dL — ABNORMAL HIGH (ref 70–99)
Glucose-Capillary: 163 mg/dL — ABNORMAL HIGH (ref 70–99)
Glucose-Capillary: 203 mg/dL — ABNORMAL HIGH (ref 70–99)
Glucose-Capillary: 152 mg/dL — ABNORMAL HIGH (ref 70–99)

## 2024-07-07 LAB — BASIC METABOLIC PANEL WITH GFR
Anion gap: 10 (ref 5–15)
BUN: 16 mg/dL (ref 8–23)
CO2: 25 mmol/L (ref 22–32)
Calcium: 8.3 mg/dL — ABNORMAL LOW (ref 8.9–10.3)
Chloride: 100 mmol/L (ref 98–111)
Creatinine, Ser: 0.82 mg/dL (ref 0.44–1.00)
GFR, Estimated: >60 mL/min (ref >60)
Glucose, Bld: 154 mg/dL — ABNORMAL HIGH (ref 70–99)
Potassium: 3.8 mmol/L (ref 3.5–5.1)
Sodium: 135 mmol/L (ref 135–145)

## 2024-07-07 LAB — PHOSPHORUS: Phosphorus: 2.8 mg/dL (ref 2.5–4.6)

## 2024-07-07 LAB — MAGNESIUM: Magnesium: 2.1 mg/dL (ref 1.7–2.4)

## 2024-07-07 MED ORDER — ~~LOC~~ CARDIAC SURGERY, PATIENT & FAMILY EDUCATION: Freq: Once | Status: AC

## 2024-07-07 MED ORDER — AMLODIPINE BESYLATE 5 MG PO TABS
5.0000 mg | ORAL_TABLET | Freq: Every day | ORAL | Status: DC
  Administered 2024-07-07: 5 mg via ORAL
  Filled 2024-07-07: qty 1

## 2024-07-07 MED ORDER — AMIODARONE HCL IN DEXTROSE 360-4.14 MG/200ML-% IV SOLN
INTRAVENOUS | Status: AC
  Filled 2024-07-07: qty 200

## 2024-07-07 MED ORDER — SODIUM CHLORIDE 0.9% FLUSH: 3.0000 mL | INTRAVENOUS | Status: DC | PRN

## 2024-07-07 MED ORDER — AMIODARONE HCL IN DEXTROSE 360-4.14 MG/200ML-% IV SOLN
60.0000 mg/h | INTRAVENOUS | Status: AC
  Administered 2024-07-07 (×2): 60 mg/h via INTRAVENOUS

## 2024-07-07 MED ORDER — AMIODARONE HCL IN DEXTROSE 360-4.14 MG/200ML-% IV SOLN
30.0000 mg/h | INTRAVENOUS | Status: DC
  Administered 2024-07-07 – 2024-07-09 (×3): 30 mg/h via INTRAVENOUS
  Filled 2024-07-07 (×4): qty 200

## 2024-07-07 MED ORDER — LISINOPRIL 10 MG PO TABS
10.0000 mg | ORAL_TABLET | Freq: Every day | ORAL | Status: DC
  Administered 2024-07-07: 10 mg via ORAL
  Filled 2024-07-07: qty 1

## 2024-07-07 MED ORDER — INSULIN ASPART 100 UNIT/ML IJ SOLN
0.0000 [IU] | Freq: Three times a day (TID) | INTRAMUSCULAR | Status: DC
  Administered 2024-07-07: 2 [IU] via SUBCUTANEOUS
  Administered 2024-07-07: 8 [IU] via SUBCUTANEOUS
  Administered 2024-07-07: 2 [IU] via SUBCUTANEOUS
  Administered 2024-07-08: 4 [IU] via SUBCUTANEOUS
  Administered 2024-07-08 – 2024-07-09 (×3): 2 [IU] via SUBCUTANEOUS
  Administered 2024-07-09 – 2024-07-10 (×4): 4 [IU] via SUBCUTANEOUS

## 2024-07-07 MED ORDER — SODIUM CHLORIDE 0.9 % IV SOLN: 250.0000 mL | INTRAVENOUS | Status: AC | PRN

## 2024-07-07 MED ORDER — FENTANYL CITRATE PF 50 MCG/ML IJ SOSY
25.0000 ug | PREFILLED_SYRINGE | Freq: Once | INTRAMUSCULAR | Status: AC
  Administered 2024-07-07: 25 ug via INTRAVENOUS
  Filled 2024-07-07: qty 1

## 2024-07-07 MED ORDER — HYDRALAZINE HCL 20 MG/ML IJ SOLN
10.0000 mg | Freq: Four times a day (QID) | INTRAMUSCULAR | Status: DC | PRN
  Administered 2024-07-07: 10 mg via INTRAVENOUS
  Filled 2024-07-07 (×2): qty 1

## 2024-07-07 MED ORDER — SODIUM CHLORIDE 0.9% FLUSH
3.0000 mL | Freq: Two times a day (BID) | INTRAVENOUS | Status: DC
  Administered 2024-07-07 – 2024-07-10 (×4): 3 mL via INTRAVENOUS

## 2024-07-07 MED ORDER — AMIODARONE LOAD VIA INFUSION
150.0000 mg | Freq: Once | INTRAVENOUS | Status: AC
  Administered 2024-07-07: 150 mg via INTRAVENOUS
  Filled 2024-07-07: qty 83.34

## 2024-07-07 NOTE — Progress Notes (Signed)
 Attempted to see pt for ambulation after discontinuation of pacing wires and CT this am. Pt is in a SVT rhythm pending EKG. Pt has walked 2x this am. C/R will follow.   Alm Parkins MS, ACSM-CEP, CCRP

## 2024-07-07 NOTE — Plan of Care (Signed)

## 2024-07-07 NOTE — Progress Notes (Signed)
 Patient received from [facility], she is alert and oriented, denies pain/discomfort, V/S obtained, CCMD notified, all needs met, call bell in reach.    07/07/24 1621  Vitals  Temp 98 F (36.7 C)  Temp Source Oral  BP (!) 178/54  MAP (mmHg) 90  BP Location Left Arm  BP Method Automatic  Patient Position (if appropriate) Lying  Pulse Rate 75  Pulse Rate Source Monitor  ECG Heart Rate 90  Resp (!) 28  Level of Consciousness  Level of Consciousness Alert  MEWS COLOR  MEWS Score Color Yellow  Oxygen Therapy  SpO2 95 %  O2 Device Room Air  Pain Assessment  Pain Scale 0-10  Pain Score 0  MEWS Score  MEWS Temp 0  MEWS Systolic 0  MEWS Pulse 0  MEWS RR 2  MEWS LOC 0  MEWS Score 2

## 2024-07-07 NOTE — Progress Notes (Signed)
 NAME:  Shari Lee, MRN:  [id number], DOB:  1956-03-11, LOS: 2 ADMISSION DATE:  07/05/2024, CONSULTATION DATE:  07/05/24 REFERRING MD:  MD Lightfoot CHIEF COMPLAINT:  Severe Aortic Stenosis/Elective AVR  History of Present Illness:  Pt is a 68 yr old female with significant past medical history of severe aortic stenosis, hypertension, asthma, sleep apnea, type 2 diabetes, and GERD presents for elective aortic valve repair by MD Lightfoot.  PCCM consulted for postop vent management as well as assisting with care during ICU stay.  Pertinent  Medical History   Past Medical History:  Diagnosis Date   Asthma    mild   Diarrhea    N/V recently   Dyspnea    GERD (gastroesophageal reflux disease)    Headache    Heart murmur    developed in last couple years/ no issues   HOH (hard of hearing)    wears aids   Hypertension    Motion sickness    car   Sleep apnea    Type 2 diabetes mellitus with hyperglycemia (HCC)      Significant Hospital Events: Including procedures, antibiotic start and stop dates in addition to other pertinent events   9/18 aortic valve repair, PCCM consult 9/18 extubated to 3 L/min   Interim History / Subjective:   Ambulating in the hallway Good pain control Tolerating diet Chest tube output 140 cc / 24 hours Persistent leukocytosis, labs otherwise reassuring    Objective    Blood pressure (!) 144/59, pulse 76, temperature 97.8 F (36.6 C), temperature source Oral, resp. rate (!) 22, height 5' 2 (1.575 m), weight 95.8 kg, SpO2 97%.        Intake/Output Summary (Last 24 hours) at 07/07/2024 0940 Last data filed at 07/07/2024 9173 Gross per 24 hour  Intake 769.56 ml  Output 2620 ml  Net -1850.44 ml   Filed Weights   07/05/24 0547 07/06/24 0600 07/07/24 0400  Weight: 93 kg 97.8 kg 95.8 kg    Examination: General: Obese woman, comfortable in bed HEENT: Oropharynx clear, strong voice, no secretions CV: Sternotomy wound looks good, 2/6  murmur pulm: Clear, decreased at the bases Abs: Obese and nondistended with normal bowel sounds Extremities: No edema Skin: No rash Neuro: Awake and alert, follows commands, appropriate  Resolved problem list   Assessment and Plan  Severe aortic stenosis s/p TAVR-9/18 03/05/2024 echo-LVEF 60-65%, LV normal function, no regional wall motion abnormalities, mild left ventricular hypertrophy, grade 1 diastolic dysfunction, RV normal.  Aortic valve is bicuspid, severe aortic valve stenosis P: -Postop care as per T CTS -Wires out today - Beta-blockade and aspirin  as ordered - Adequate pain control - Likely transition out of the ICU today 9/20   Post op vent management Leukocytosis- patient difficult airway, needing decadron  10mg  IV, suspect elevated WBC reactive from surgery/steroids   P: - Continue pulmonary hygiene.  She is working with I-S - Albuterol  available if needed  Expected perioperative blood loss anemia  Hgb 9.9  P:  -Follow intermittent CBC, transfusion threshold hemoglobin 7.0  Hypertension PTA-on atenolol , amlodipine   P: -Restarting home amlodipine , on metoprolol   Asthma Sleep apnea > has CPAP but poor compliance PTA-albuterol  as needed, Singulair , Zyrtec  P: - defer CPA for now, she does not use at home. If evidence that her OSA is impacting recovery then may need to push for her to wear -Pulmonary hygiene -Albuterol  available if needed -Continue Zyrtec , Singulair   Type 2 diabetes with hyperglycemia PTA-on insulin  NovoLog  FlexPen, [name]  FlexPen, on  Farxiga   Per records patient has freestyle libre Hgb A1c 8.9  P: -Sliding scale insulin  per protocol - Lantus  14 units daily - Diabetes coordinator to help with home regimen  GERD  PTA-on omeprazole P: -Pantoprazole  converted to p.o.  Labs   CBC: Recent Labs  Lab 07/05/24 1314 07/05/24 1315 07/05/24 1701 07/05/24 1934 07/06/24 0320 07/06/24 1700 07/07/24 0401  WBC 27.2*  --   --  17.6* 19.1*  24.0* 21.3*  HGB 9.9*   < > 8.8* 9.4* 9.3* 9.8* 9.5*  HCT 30.8*   < > 26.0* 29.2* 29.0* 30.7* 30.1*  MCV 88.5  --   --  87.7 87.6 88.2 88.8  PLT 203  --   --  181 189 208 198   < > = values in this interval not displayed.    Basic Metabolic Panel: Recent Labs  Lab 07/03/24 1103 07/05/24 0842 07/05/24 1204 07/05/24 1213 07/05/24 1701 07/05/24 1934 07/06/24 0320 07/06/24 1700 07/07/24 0401  NA 137   < > 141   < > 144 139 138 135 135  K 4.3   < > 3.9   < > 4.2 4.2 4.4 4.1 3.8  CL 107   < > 109  --   --  107 109 101 100  CO2 21*  --   --   --   --  23 23 25 25   GLUCOSE 172*   < > 141*  --   --  162* 113* 185* 154*  BUN 16   < > 19  --   --  16 18 18 16   CREATININE 0.90   < > 0.70  --   --  0.82 0.86 0.94 0.82  CALCIUM  9.1  --   --   --   --  8.3* 8.1* 8.5* 8.3*  MG  --   --   --   --   --  3.0* 2.7* 2.2 2.1  PHOS  --   --   --   --   --   --   --   --  2.8   < > = values in this interval not displayed.   GFR: Estimated Creatinine Clearance: 71.9 mL/min (by C-G formula based on SCr of 0.82 mg/dL). Recent Labs  Lab 07/05/24 1934 07/06/24 0320 07/06/24 1700 07/07/24 0401  WBC 17.6* 19.1* 24.0* 21.3*    Liver Function Tests: Recent Labs  Lab 07/03/24 1103  AST 20  ALT 16  ALKPHOS 75  BILITOT <0.2  PROT 7.0  ALBUMIN  3.6   No results for input(s): LIPASE, AMYLASE in the last 168 hours. No results for input(s): AMMONIA in the last 168 hours.  ABG    Component Value Date/Time   PHART 7.271 (L) 07/05/2024 1701   PCO2ART 42.0 07/05/2024 1701   PO2ART 71 (L) 07/05/2024 1701   HCO3 19.3 (L) 07/05/2024 1701   TCO2 21 (L) 07/05/2024 1701   ACIDBASEDEF 7.0 (H) 07/05/2024 1701   O2SAT 91 07/05/2024 1701     Coagulation Profile: Recent Labs  Lab 07/03/24 1103 07/05/24 1314  INR 1.0 1.5*    Cardiac Enzymes: No results for input(s): CKTOTAL, CKMB, CKMBINDEX, TROPONINI in the last 168 hours.  HbA1C: Hgb A1c MFr Bld  Date/Time Value Ref Range  Status  07/03/2024 11:03 AM 8.9 (H) 4.8 - 5.6 % Final    Comment:    (NOTE) Diagnosis of Diabetes The following HbA1c ranges recommended by the American Diabetes Association (ADA) may be used as an  aid in the diagnosis of diabetes mellitus.  Hemoglobin             Suggested A1C NGSP%              Diagnosis  <5.7                   Non Diabetic  5.7-6.4                Pre-Diabetic  >6.4                   Diabetic  <7.0                   Glycemic control for                       adults with diabetes.    01/24/2024 08:23 AM 9.0 (H) 4.6 - 6.5 % Final    Comment:    Glycemic Control Guidelines for People with Diabetes:Non Diabetic:  <6%Goal of Therapy: <7%Additional Action Suggested:  >8%     CBG: Recent Labs  Lab 07/06/24 1554 07/06/24 2029 07/06/24 2323 07/07/24 0412 07/07/24 0752  GLUCAP 158* 177* 142* 144* 147*     Critical care time: NA    Lamar Chris, MD, PhD 07/07/2024, 9:40 AM [place] [phone] or if no answer before 7:00PM call [phone] For any issues after 7:00PM please call eLink [phone]

## 2024-07-08 LAB — CBC
HCT: 29.3 % — ABNORMAL LOW (ref 36.0–46.0)
Hemoglobin: 9.5 g/dL — ABNORMAL LOW (ref 12.0–15.0)
MCH: 27.7 pg (ref 26.0–34.0)
MCHC: 32.4 g/dL (ref 30.0–36.0)
MCV: 85.4 fL (ref 80.0–100.0)
Platelets: 226 K/uL (ref 150–400)
RBC: 3.43 MIL/uL — ABNORMAL LOW (ref 3.87–5.11)
RDW: 14.4 % (ref 11.5–15.5)
WBC: 17.5 K/uL — ABNORMAL HIGH (ref 4.0–10.5)
nRBC: 0 % (ref 0.0–0.2)

## 2024-07-08 LAB — BASIC METABOLIC PANEL WITH GFR
Anion gap: 9 (ref 5–15)
BUN: 11 mg/dL (ref 8–23)
CO2: 25 mmol/L (ref 22–32)
Calcium: 8.3 mg/dL — ABNORMAL LOW (ref 8.9–10.3)
Chloride: 100 mmol/L (ref 98–111)
Creatinine, Ser: 0.78 mg/dL (ref 0.44–1.00)
GFR, Estimated: >60 mL/min (ref >60)
Glucose, Bld: 152 mg/dL — ABNORMAL HIGH (ref 70–99)
Potassium: 3.7 mmol/L (ref 3.5–5.1)
Sodium: 134 mmol/L — ABNORMAL LOW (ref 135–145)

## 2024-07-08 LAB — GLUCOSE, CAPILLARY
Glucose-Capillary: 146 mg/dL — ABNORMAL HIGH (ref 70–99)
Glucose-Capillary: 152 mg/dL — ABNORMAL HIGH (ref 70–99)
Glucose-Capillary: 97 mg/dL (ref 70–99)
Glucose-Capillary: 160 mg/dL — ABNORMAL HIGH (ref 70–99)

## 2024-07-08 MED ORDER — POTASSIUM CHLORIDE CRYS ER 20 MEQ PO TBCR
40.0000 meq | EXTENDED_RELEASE_TABLET | Freq: Once | ORAL | Status: AC
  Administered 2024-07-08: 40 meq via ORAL
  Filled 2024-07-08: qty 2

## 2024-07-08 MED ORDER — AMLODIPINE BESYLATE 5 MG PO TABS
5.0000 mg | ORAL_TABLET | Freq: Every day | ORAL | Status: DC
  Administered 2024-07-09: 5 mg via ORAL
  Filled 2024-07-08 (×2): qty 1

## 2024-07-08 MED ORDER — DAPAGLIFLOZIN PROPANEDIOL 10 MG PO TABS
10.0000 mg | ORAL_TABLET | Freq: Every day | ORAL | Status: DC
  Administered 2024-07-08 – 2024-07-11 (×4): 10 mg via ORAL
  Filled 2024-07-08 (×4): qty 1

## 2024-07-08 MED ORDER — METOPROLOL TARTRATE 25 MG/10 ML ORAL SUSPENSION
12.5000 mg | Freq: Two times a day (BID) | ORAL | Status: DC
  Filled 2024-07-08 (×3): qty 5

## 2024-07-08 MED ORDER — BENAZEPRIL HCL 5 MG PO TABS
20.0000 mg | ORAL_TABLET | Freq: Every day | ORAL | Status: DC
  Administered 2024-07-08 – 2024-07-09 (×2): 20 mg via ORAL
  Filled 2024-07-08 (×2): qty 4

## 2024-07-08 MED ORDER — METOPROLOL TARTRATE 25 MG PO TABS
25.0000 mg | ORAL_TABLET | Freq: Two times a day (BID) | ORAL | Status: DC
  Administered 2024-07-08 – 2024-07-10 (×5): 25 mg via ORAL
  Filled 2024-07-08 (×5): qty 1

## 2024-07-08 MED ORDER — ENOXAPARIN SODIUM 40 MG/0.4ML IJ SOSY
40.0000 mg | PREFILLED_SYRINGE | INTRAMUSCULAR | Status: DC
  Administered 2024-07-08 – 2024-07-11 (×4): 40 mg via SUBCUTANEOUS
  Filled 2024-07-08 (×4): qty 0.4

## 2024-07-08 NOTE — Plan of Care (Signed)
  Problem: Clinical Measurements: Goal: Will remain free from infection Outcome: Progressing   Problem: Nutrition: Goal: Adequate nutrition will be maintained Outcome: Progressing   Problem: Pain Managment: Goal: General experience of comfort will improve and/or be controlled Outcome: Progressing   Problem: Cardiac: Goal: Will achieve and/or maintain hemodynamic stability Outcome: Progressing

## 2024-07-08 NOTE — Progress Notes (Addendum)
 3 Days Post-Op Procedure(s) (LRB): REPLACEMENT, AORTIC VALVE, OPEN USING INSPIRIS VALVE (N/A) ECHOCARDIOGRAM, TRANSESOPHAGEAL (N/A) Subjective: Feels ok, some pain and nausea overnight  Objective: Vital signs in last 24 hours: Temp:  [97.7 F (36.5 C)-99.4 F (37.4 C)] 98.9 F (37.2 C) (09/21 0229) Pulse Rate:  [66-152] 66 (09/21 0229) Cardiac Rhythm: Normal sinus rhythm (09/20 1901) Resp:  [16-29] 20 (09/21 0229) BP: (127-178)/(49-99) 140/65 (09/21 0229) SpO2:  [90 %-97 %] 93 % (09/21 0229) Weight:  [93.8 kg-95.4 kg] 93.8 kg (09/21 0500)  Hemodynamic parameters for last 24 hours:    Intake/Output from previous day: 09/20 0701 - 09/21 0700 In: 639.5 [P.O.:480; I.V.:159.5] Out: 1730 [Urine:1700; Chest Tube:30] Intake/Output this shift: No intake/output data recorded.  General appearance: alert, cooperative, and no distress Heart: regular rate and rhythm Lungs: clear to auscultation bilaterally Abdomen: benign Extremities: no edema Wound: dressing intact  Lab Results: Recent Labs    07/07/24 0401 07/08/24 0246  WBC 21.3* 17.5*  HGB 9.5* 9.5*  HCT 30.1* 29.3*  PLT 198 226   BMET:  Recent Labs    07/07/24 0401 07/08/24 0246  NA 135 134*  K 3.8 3.7  CL 100 100  CO2 25 25  GLUCOSE 154* 152*  BUN 16 11  CREATININE 0.82 0.78  CALCIUM  8.3* 8.3*    PT/INR:  Recent Labs    07/05/24 1314  LABPROT 18.6*  INR 1.5*   ABG    Component Value Date/Time   PHART 7.271 (L) 07/05/2024 1701   HCO3 19.3 (L) 07/05/2024 1701   TCO2 21 (L) 07/05/2024 1701   ACIDBASEDEF 7.0 (H) 07/05/2024 1701   O2SAT 91 07/05/2024 1701   CBG (last 3)  Recent Labs    07/07/24 1502 07/07/24 2100 07/08/24 0618  GLUCAP 203* 152* 146*    Meds Scheduled Meds:  acetaminophen   1,000 mg Oral Q6H   Or   acetaminophen  (TYLENOL ) oral liquid 160 mg/5 mL  1,000 mg Per Tube Q6H   amLODipine   5 mg Oral Daily   aspirin  EC  325 mg Oral Daily   Or   aspirin   324 mg Per Tube Daily    bisacodyl   10 mg Oral Daily   Or   bisacodyl   10 mg Rectal Daily   Chlorhexidine  Gluconate Cloth  6 each Topical Daily   docusate sodium   200 mg Oral Daily   ezetimibe   10 mg Oral Daily   insulin  aspart  0-24 Units Subcutaneous TID AC & HS   insulin  glargine  14 Units Subcutaneous Daily   lisinopril   10 mg Oral Daily   loratadine   10 mg Oral Daily   metoprolol  tartrate  12.5 mg Oral BID   Or   metoprolol  tartrate  12.5 mg Per Tube BID   montelukast   10 mg Oral QHS   pantoprazole   40 mg Oral Daily   sodium chloride  flush  10-40 mL Intracatheter Q12H   sodium chloride  flush  3 mL Intravenous Q12H   sodium chloride  flush  3 mL Intravenous Q12H   Continuous Infusions:  sodium chloride      albumin  human Stopped (07/06/24 0701)   amiodarone  30 mg/hr (07/07/24 2248)   PRN Meds:.sodium chloride , albumin  human, dextrose , hydrALAZINE , metoprolol  tartrate, midazolam , morphine  injection, ondansetron  (ZOFRAN ) IV, oxyCODONE , sodium chloride  flush, sodium chloride  flush, sodium chloride  flush, traMADol   Xrays DG Chest Port 1 View Result Date: 07/07/2024 CLINICAL DATA:  Pneumothorax EXAM: PORTABLE CHEST 1 VIEW COMPARISON:  07/06/2024 FINDINGS: Right internal jugular central line and  mediastinal drains remain in place, unchanged. Cardiomegaly. Minimal bibasilar opacities, favor atelectasis. No effusions or pneumothorax. No acute bony abnormality. IMPRESSION: Cardiomegaly. Bibasilar opacities, favor atelectasis. Electronically Signed   By: Franky Crease M.D.   On: 07/07/2024 17:17    Assessment/Plan: S/P Procedure(s) (LRB): REPLACEMENT, AORTIC VALVE, OPEN USING INSPIRIS VALVE (N/A) ECHOCARDIOGRAM, TRANSESOPHAGEAL (N/A) POD#3   1 Tmax 99.4, sinus rhythm converted from afib, on amio gtt- cont, , PVC's, s BP 120's-170's- mostly in higher range- will resume lotrel and increase beta blocker dose 2 O2 sats mostly ok on RA- some readings in 80's 3 good UOP- not all measured, weight at preop 4  BS - adeq control, will resume farxige, will need better outpatient management w/ Hg A1c 8.0 5 normal renal fxn 6 leukocytosis trending lower, WBC 17.5, likely reactive, cont to monitor 7 replace K+ to get >2 8 cont pulm hygiene and rehab , add lovenox  for DVT ppx   LOS: 3 days    Lemond FORBES Salon Pager [phone] 07/08/2024   Agree Back in sinus Dispo planning  Naria Abbey O Kysa Calais

## 2024-07-09 LAB — GLUCOSE, CAPILLARY
Glucose-Capillary: 106 mg/dL — ABNORMAL HIGH (ref 70–99)
Glucose-Capillary: 172 mg/dL — ABNORMAL HIGH (ref 70–99)
Glucose-Capillary: 129 mg/dL — ABNORMAL HIGH (ref 70–99)
Glucose-Capillary: 195 mg/dL — ABNORMAL HIGH (ref 70–99)

## 2024-07-09 MED ORDER — BENAZEPRIL HCL 20 MG PO TABS
40.0000 mg | ORAL_TABLET | Freq: Once | ORAL | Status: AC
Start: 2024-07-09 — End: 2024-07-09
  Administered 2024-07-09: 40 mg via ORAL
  Filled 2024-07-09: qty 2

## 2024-07-09 MED ORDER — AMLODIPINE BESYLATE 10 MG PO TABS
10.0000 mg | ORAL_TABLET | Freq: Every day | ORAL | Status: DC
  Administered 2024-07-10 – 2024-07-11 (×2): 10 mg via ORAL
  Filled 2024-07-09 (×2): qty 1

## 2024-07-09 MED ORDER — BENAZEPRIL HCL 20 MG PO TABS
40.0000 mg | ORAL_TABLET | Freq: Every day | ORAL | Status: DC
  Administered 2024-07-10 – 2024-07-11 (×2): 40 mg via ORAL
  Filled 2024-07-09: qty 2
  Filled 2024-07-09: qty 8
  Filled 2024-07-09: qty 2

## 2024-07-09 MED ORDER — AMLODIPINE BESYLATE 10 MG PO TABS
10.0000 mg | ORAL_TABLET | Freq: Once | ORAL | Status: AC
  Administered 2024-07-09: 10 mg via ORAL
  Filled 2024-07-09: qty 1

## 2024-07-09 MED ORDER — AMIODARONE HCL 200 MG PO TABS
400.0000 mg | ORAL_TABLET | Freq: Two times a day (BID) | ORAL | Status: DC
Start: 2024-07-09 — End: 2024-07-11
  Administered 2024-07-09 – 2024-07-11 (×5): 400 mg via ORAL
  Filled 2024-07-09 (×5): qty 2

## 2024-07-09 NOTE — Discharge Summary (Signed)
 [address] [place] [address]             [phone]        Physician Discharge Summary  Patient ID: Jmya Uliano MRN: [id number] DOB/AGE: 1955/12/22 68 y.o.  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   Type 2 diabetes mellitus with hyperglycemia (HCC) 04/29/2015   Osteochondritis of tibial tuberosity 04/29/2015   Hyperlipidemia associated with type 2 diabetes mellitus (HCC) 12/11/2009   Apnea, sleep 07/30/2008   Colon, diverticulosis 06/07/2008   Hypertension associated with diabetes (HCC) 04/25/2006     Discharge Diagnoses:  Patient Active Problem List   Diagnosis Date Noted   Atrial fibrillation (HCC) 07/11/2024   S/P AVR (aortic valve replacement) 07/05/2024   Seasonal allergic rhinitis 07/22/2022   History of colonic polyps    Severe aortic stenosis 02/02/2022   Murmur, cardiac 01/06/2022   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   Type 2 diabetes mellitus with hyperglycemia (HCC) 04/29/2015   Osteochondritis of tibial tuberosity 04/29/2015   Hyperlipidemia associated with type 2 diabetes mellitus (HCC) 12/11/2009   Apnea, sleep 07/30/2008   Colon, diverticulosis 06/07/2008   Hypertension associated with diabetes (HCC) 04/25/2006   Patient Active Problem List   Diagnosis Date Noted   Atrial fibrillation (HCC) 07/11/2024   S/P AVR (aortic valve replacement) 07/05/2024   Seasonal allergic rhinitis 07/22/2022   History of colonic polyps    Severe aortic stenosis 02/02/2022   Murmur, cardiac 01/06/2022   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 female presents for surgical evaluation of severe aortic stenosis.  She admits to occasional chest pain.  She regularly has shortness of breath, and denies any syncopal episodes, but does have some dizziness.  She has an STS score 1.99 and a NYHA class II.  She has undergone full diagnostic evaluation.  He has been found to have a bicuspid aortic valve.  Following full review of the patient and all relevant studies Dr. Shyrl recommended proceeding with elective aortic valve replacement.  Hospital course: The patient was admitted electively and on 07/05/2024 she was taken to the operating room at which time she underwent aortic valve replacement with a #21 Inspiris Resilia bioprosthetic valve.  She tolerated the procedure well and was taken to the surgical intensive care unit in stable condition.   Postoperative hospital course:  Patient has done well.  He was extubated from the ventilator using standard postcardiac surgical protocols without difficulty.  She is neurologically intact.  Pain has been under adequate control.  She did develop postoperative atrial fibrillation and was subsequently chemically cardioverted with amiodarone .  All routine lines,  monitors and drainage devices have been discontinued in the standard fashion.  She does have expected acute blood loss anemia which is stabilized.  She has maintained normal renal function.  She does have a reactive leukocytosis and is being monitored clinically.  She  has been seen by physical therapy to assist with postoperative mobilization due to deconditioning.  She is making slow but steady improvement in this regard.  She did develop a phlebitis associated with amiodarone  infusion with subsequent development of a mild cellulitis.  She has been started on Keflex  for this.  Her blood pressure has been difficult to control and her home medications have been uptitrated to higher dosing.  She also has been started on hydrochlorothiazide .  She may need further outpatient adjusting of her hypertension medications.  Her diabetes control has been good in the hospital.  She is discharged on her home diabetic regimen.  Overall, at the time of discharge she is felt to be quite stable.  Consults: pulmonary/intensive care  Significant Diagnostic Studies:  DG Chest Port 1 View Result Date: 07/07/2024 CLINICAL DATA:  Pneumothorax EXAM: PORTABLE CHEST 1 VIEW COMPARISON:  07/06/2024 FINDINGS: Right internal jugular central line and mediastinal drains remain in place, unchanged. Cardiomegaly. Minimal bibasilar opacities, favor atelectasis. No effusions or pneumothorax. No acute bony abnormality. IMPRESSION: Cardiomegaly. Bibasilar opacities, favor atelectasis. Electronically Signed   By: Franky Crease M.D.   On: 07/07/2024 17:17   DG Chest Port 1 View Result Date: 07/06/2024 EXAM: 1 VIEW XRAY OF THE CHEST 07/06/2024 05:42:00 AM COMPARISON: 07/05/2024 CLINICAL HISTORY: S/P aortic valve replacement with bioprosthetic valve FINDINGS: LINES, TUBES AND DEVICES: Stable right IJ CVC and mediastinal drain. Interval extubation. LUNGS AND PLEURA: Low lung volumes with bronchovascular crowding. Left basilar opacities. Small left pleural effusion. No pneumothorax. HEART AND MEDIASTINUM: Stable mediastinal contours status post median sternotomy and aortic valve replacement. Unchanged cardiomegaly. BONES AND SOFT TISSUES: No acute osseous abnormality. IMPRESSION: 1. Unchanged cardiomegaly. 2. Left  basilar opacities and small left pleural effusion. Electronically signed by: Andrea Gasman MD 07/06/2024 01:18 PM EDT RP Workstation: [id number]   DG Chest Port 1 View Result Date: 07/05/2024 CLINICAL DATA:  Status post aortic valve replacement EXAM: PORTABLE CHEST 1 VIEW COMPARISON:  07/03/2024 FINDINGS: Postoperative changes. Right internal jugular vascular sheath in place, no pneumothorax. Endotracheal tube tip in the mid to lower trachea. Carina not well visualized. NG tube tip in the proximal stomach with the side port near the GE junction. Cardiomegaly with vascular congestion. Low lung volumes. No visible effusions or pneumothorax. IMPRESSION: Low lung volumes.  Cardiomegaly with vascular congestion. Electronically Signed   By: Franky Crease M.D.   On: 07/05/2024 13:50   DG Chest 2 View Result Date: 07/03/2024 CLINICAL DATA:  Preop EXAM: CHEST - 2 VIEW COMPARISON:  10/06/2020 FINDINGS: The heart size and mediastinal contours are within normal limits. Both lungs are clear. The visualized skeletal structures are unremarkable. IMPRESSION: No active cardiopulmonary disease. Electronically Signed   By: Franky Crease M.D.   On: 07/03/2024 11:39   VAS US  CAROTID Result Date: 07/03/2024 Carotid Arterial Duplex Study Patient Name:  MARAL LAMPE  Date of Exam:   07/03/2024 Medical Rec #: [id number]       Accession #:    [id number] Date of Birth: Feb 18, 1956       Patient Gender: F Patient Age:   72 years Exam Location:  [facility] Procedure:      VAS US  CAROTID Referring Phys: HARRELL LIGHTFOOT --------------------------------------------------------------------------------  Indications:  Preop for aortic valve replacement. Risk Factors:      Hypertension, Diabetes, coronary artery disease. Comparison Study:  No priors. Performing Technologist: Ricka Sturdivant-Jones RDMS, RVT  Examination Guidelines: A complete evaluation includes B-mode imaging, spectral Doppler, color Doppler, and power Doppler  as needed of all accessible portions of each vessel. Bilateral testing is considered an integral part of a complete examination. Limited examinations for reoccurring indications may be performed as noted.  Right Carotid Findings: +----------+--------+--------+--------+------------------+------------------+           PSV cm/sEDV cm/sStenosisPlaque DescriptionComments           +----------+--------+--------+--------+------------------+------------------+ CCA Prox  74      16                                                   +----------+--------+--------+--------+------------------+------------------+ CCA Distal69      18                                                   +----------+--------+--------+--------+------------------+------------------+ ICA Prox  69      25      1-39%                     intimal thickening +----------+--------+--------+--------+------------------+------------------+ ICA Distal57      24                                                   +----------+--------+--------+--------+------------------+------------------+ ECA       44      10                                                   +----------+--------+--------+--------+------------------+------------------+ +----------+--------+-------+----------------+-------------------+           PSV cm/sEDV cmsDescribe        Arm Pressure (mmHG) +----------+--------+-------+----------------+-------------------+ [id number]            Multiphasic, WNL                    +----------+--------+-------+----------------+-------------------+ +---------+--------+--+--------+--+---------+ VertebralPSV cm/s59EDV cm/s20Antegrade +---------+--------+--+--------+--+---------+  Left Carotid Findings: +----------+--------+--------+--------+------------------+--------+           PSV cm/sEDV cm/sStenosisPlaque DescriptionComments +----------+--------+--------+--------+------------------+--------+ CCA  Prox  65      18                                         +----------+--------+--------+--------+------------------+--------+ CCA Distal70      22                                         +----------+--------+--------+--------+------------------+--------+ ICA Prox  70      24      1-39%   calcific                   +----------+--------+--------+--------+------------------+--------+  ICA Distal74      29                                         +----------+--------+--------+--------+------------------+--------+ ECA       76      13                                         +----------+--------+--------+--------+------------------+--------+ +----------+--------+--------+----------------+-------------------+           PSV cm/sEDV cm/sDescribe        Arm Pressure (mmHG) +----------+--------+--------+----------------+-------------------+ Subclavian109             Multiphasic, WNL                    +----------+--------+--------+----------------+-------------------+ +---------+--------+--+--------+--+---------+ VertebralPSV cm/s49EDV cm/s16Antegrade +---------+--------+--+--------+--+---------+   Summary: Right Carotid: Velocities in the right ICA are consistent with a 1-39% stenosis. Left Carotid: Velocities in the left ICA are consistent with a 1-39% stenosis. Vertebrals:  Bilateral vertebral arteries demonstrate antegrade flow. Subclavians: Normal flow hemodynamics were seen in bilateral subclavian              arteries. *See table(s) above for measurements and observations.  Electronically signed by Debby Robertson on 07/03/2024 at 10:50:51 AM.    Final      Treatments: surgery:   07/05/2024 Patient:  [name] Pre-Op Dx: severe aortic stenosis   Post-op Dx:  same Procedure: Aortic valve replacement with a 21mm Insipiris Valve       Surgeon and Role:      * Lightfoot, Linnie KIDD, MD - Primary    DEWAINE MICAEL Cera - PA-C  Anesthesia  general  Discharge  Exam: Blood pressure (!) 153/59, pulse 82, temperature 98.2 F (36.8 C), temperature source Oral, resp. rate 20, height 5' 2 (1.575 m), weight 92.8 kg, SpO2 96%.   General appearance: alert, cooperative, and no distress Heart: regular rate and rhythm and soft systolic murmur Lungs: clear to auscultation bilaterally Abdomen: benign Extremities: no edema Wound: incis healing well Right hand phlebitis looks a little more erethematous  Discharge Medications:  The patient has been discharged on:   1.Beta Blocker:  Yes [   ]                              No   [   ]                              If No, reason:  2.Ace Inhibitor/ARB: Yes [   ]                                     No  [    ]                                     If No, reason:  3.Statin:   Yes [   ]                  No  [   ]  If No, reason:  4.Ecasa:  Yes  [   ]                  No   [   ]                  If No, reason:  Patient had ACS upon admission:  Plavix/P2Y12 inhibitor: Yes [   ]                                      No  [   ]     Discharge Instructions     Amb Referral to Cardiac Rehabilitation   Complete by: As directed    Diagnosis: Valve Replacement   Valve: Aortic   After initial evaluation and assessments completed: Virtual Based Care may be provided alone or in conjunction with Phase 2 Cardiac Rehab based on patient barriers.: Yes   Intensive Cardiac Rehabilitation (ICR) [facility] location only OR Traditional Cardiac Rehabilitation (TCR) *If criteria for ICR are not met will enroll in TCR ([facility] only): Yes      Allergies as of 07/11/2024       Reactions   Amoxicillin-pot Clavulanate Other (See Comments)   Deathly sick per patient   Levsin  [hyoscyamine]    Dizziness   Ozempic  (0.25 Or 0.5 Mg-dose) [semaglutide (0.25 Or 0.5mg -dos)] Other (See Comments)   pancreatitis   Shellfish Allergy Diarrhea, Nausea And Vomiting   dizziness   Metformin  And Related Diarrhea         Medication List     STOP taking these medications    amLODipine -benazepril  5-20 MG capsule Commonly known as: LOTREL   atenolol  50 MG tablet Commonly known as: TENORMIN    chlorhexidine  0.12 % solution Commonly known as: PERIDEX        TAKE these medications    albuterol  108 (90 Base) MCG/ACT inhaler Commonly known as: VENTOLIN  HFA Inhale 2 puffs into the lungs every 6 (six) hours as needed for wheezing or shortness of breath.   amiodarone  200 MG tablet Commonly known as: PACERONE  Take 2 tablets (400mg  total) by mouth 2 (two) times daily For 5 days, then take 2 tablets (400mg ) once daily.   amLODipine  10 MG tablet Commonly known as: NORVASC  Take 1 tablet (10 mg total) by mouth daily.   aspirin  EC 325 MG tablet Take 1 tablet (325 mg total) by mouth daily.   benazepril  40 MG tablet Commonly known as: LOTENSIN  Take 1 tablet (40 mg total) by mouth daily.   CareTouch Safety Lancets 26G Misc 1 Device by Does not apply route daily. To check blood sugar once daily.   cephALEXin  500 MG capsule Commonly known as: KEFLEX  Take 1 capsule (500 mg total) by mouth every 8 (eight) hours.   cetirizine  10 MG tablet Commonly known as: ZYRTEC  Take 1 tablet by mouth once daily   Contour Next Test test strip Generic drug: glucose blood To check blood sugar once daily.   Contour Next USB Monitor w/Device Kit 1 kit by Does not apply route daily. To check blood sugar once daily.   dapagliflozin  propanediol 10 MG Tabs tablet Commonly known as: Farxiga  Take 1 tablet (10 mg total) by mouth daily. For 2025 [place] Patient Assistance Program   ezetimibe  10 MG tablet Commonly known as: Zetia  Take 1 tablet (10 mg total) by mouth daily.   FreeStyle Libre 3 Plus Sensor Misc USE  AS DIRECTED. CHANGE SENSOR EVERY 15 DAYS.   hydrochlorothiazide  12.5 MG tablet Commonly known as: HYDRODIURIL  Take 1 tablet (12.5 mg total) by mouth daily.   insulin  aspart 100 UNIT/ML FlexPen Commonly known  as: NOVOLOG  Inject 4 to 10 units up to 3 times a day with a meal. What changed:  how much to take how to take this when to take this additional instructions   Insulin  Pen Needle 32G X 4 MM Misc Commonly known as: NovoFine Plus To use with ozempic  injections   metoprolol  tartrate 25 MG tablet Commonly known as: LOPRESSOR  Take 1.5 tablets (37.5 mg total) by mouth 2 (two) times daily.   montelukast  10 MG tablet Commonly known as: SINGULAIR  TAKE 1 TABLET BY MOUTH AT BEDTIME   omeprazole 20 MG tablet Commonly known as: PRILOSEC OTC Take 20 mg by mouth daily.   oxyCODONE  5 MG immediate release tablet Commonly known as: Oxy IR/ROXICODONE  Take 1 tablet (5 mg total) by mouth every 6 (six) hours as needed for severe pain (pain score 7-10).   [name]  FlexTouch 100 UNIT/ML FlexTouch Pen Generic drug: insulin  degludec Inject 25 Units into the skin 2 (two) times daily.        Follow-up Information     Lightfoot, Linnie KIDD, MD Follow up.   Specialty: Cardiothoracic Surgery Why: Sep26th  TELEPHONE OFFICE VISIT with Linnie KIDD Rayas Friday Jul 13, 2024 3:10 PM [facility] at [employer] [employer] [phone] Contact information: [address] [place] [address] [phone]                 Signed:  Ellarae Nevitt E Aldon Hengst, PA-C  07/11/2024, 12:31 PM

## 2024-07-09 NOTE — Progress Notes (Addendum)
 4 Days Post-Op Procedure(s) (LRB): REPLACEMENT, AORTIC VALVE, OPEN USING INSPIRIS VALVE (N/A) ECHOCARDIOGRAM, TRANSESOPHAGEAL (N/A) Subjective: Feels ok, didn't walk yesterday, mild nausea  Objective: Vital signs in last 24 hours: Temp:  [98.2 F (36.8 C)-98.8 F (37.1 C)] 98.2 F (36.8 C) (09/22 0400) Pulse Rate:  [72-87] 72 (09/22 0400) Cardiac Rhythm: Normal sinus rhythm (09/21 1900) Resp:  [18-24] 24 (09/22 0400) BP: (104-177)/(62-87) 144/70 (09/22 0400) SpO2:  [91 %-95 %] 94 % (09/22 0400)  Hemodynamic parameters for last 24 hours:    Intake/Output from previous day: 09/21 0701 - 09/22 0700 In: 720 [P.O.:720] Out: 1950 [Urine:1950] Intake/Output this shift: No intake/output data recorded.  Alert , VAD Lungs: mildly dim in bases Cardiac: RRR, no murmur or rub Abd: benign Ext: No edema Incis : healing well Right hand: some mild erethema/tenderness from IV site Lab Results: Recent Labs    07/07/24 0401 07/08/24 0246  WBC 21.3* 17.5*  HGB 9.5* 9.5*  HCT 30.1* 29.3*  PLT 198 226   BMET:  Recent Labs    07/07/24 0401 07/08/24 0246  NA 135 134*  K 3.8 3.7  CL 100 100  CO2 25 25  GLUCOSE 154* 152*  BUN 16 11  CREATININE 0.82 0.78  CALCIUM  8.3* 8.3*    PT/INR: No results for input(s): LABPROT, INR in the last 72 hours. ABG    Component Value Date/Time   PHART 7.271 (L) 07/05/2024 1701   HCO3 19.3 (L) 07/05/2024 1701   TCO2 21 (L) 07/05/2024 1701   ACIDBASEDEF 7.0 (H) 07/05/2024 1701   O2SAT 91 07/05/2024 1701   CBG (last 3)  Recent Labs    07/08/24 1632 07/08/24 2053 07/09/24 0552  GLUCAP 97 160* 106*    Meds Scheduled Meds:  acetaminophen   1,000 mg Oral Q6H   Or   acetaminophen  (TYLENOL ) oral liquid 160 mg/5 mL  1,000 mg Per Tube Q6H   amLODipine   5 mg Oral Daily   And   benazepril   20 mg Oral Daily   aspirin  EC  325 mg Oral Daily   Or   aspirin   324 mg Per Tube Daily   bisacodyl   10 mg Oral Daily   Or   bisacodyl   10 mg  Rectal Daily   Chlorhexidine  Gluconate Cloth  6 each Topical Daily   dapagliflozin  propanediol  10 mg Oral Daily   docusate sodium   200 mg Oral Daily   enoxaparin  (LOVENOX ) injection  40 mg Subcutaneous Q24H   ezetimibe   10 mg Oral Daily   insulin  aspart  0-24 Units Subcutaneous TID AC & HS   insulin  glargine  14 Units Subcutaneous Daily   loratadine   10 mg Oral Daily   metoprolol  tartrate  25 mg Oral BID   Or   metoprolol  tartrate  12.5 mg Per Tube BID   montelukast   10 mg Oral QHS   pantoprazole   40 mg Oral Daily   sodium chloride  flush  10-40 mL Intracatheter Q12H   sodium chloride  flush  3 mL Intravenous Q12H   sodium chloride  flush  3 mL Intravenous Q12H   Continuous Infusions:  albumin  human Stopped (07/06/24 0701)   amiodarone  30 mg/hr (07/09/24 0653)   PRN Meds:.albumin  human, dextrose , hydrALAZINE , metoprolol  tartrate, midazolam , morphine  injection, ondansetron  (ZOFRAN ) IV, oxyCODONE , sodium chloride  flush, sodium chloride  flush, sodium chloride  flush, traMADol   Xrays No results found.  Assessment/Plan: S/P Procedure(s) (LRB): REPLACEMENT, AORTIC VALVE, OPEN USING INSPIRIS VALVE (N/A) ECHOCARDIOGRAM, TRANSESOPHAGEAL (N/A) POD#4  1 afeb, NSR,  s BP variable, 100's-170's range mostly elevated- may need additional agent, lotrel resumed yesterday and beta blocker dose was increased. Will convert to po amio today 2 O2 sats good on RA 3 weight pending, voiding well, not all recorded 4 BS adeq control 5 no new labs or Xrays 6 walk around this am - she didn't at all yesterday 7 phlebitis right hand- observe, warm compress, may need to consider abx 8 will see how she does this am and poss consider d/c later today if ok    Addendum: Patient examined ambulating a little better this morning however blood pressure mains poorly controlled with systolic up to the 180s.  Will increase both Norvasc  and Lotensin  doses and give additional dose remaining.  Discharge for  today.  LOS: 4 days    Shari FORBES Cera PA-C Pager [phone] 07/09/2024  Agree PT/OT Dispo planning  Shari Lee

## 2024-07-09 NOTE — Plan of Care (Signed)

## 2024-07-09 NOTE — Progress Notes (Addendum)
 CARDIAC REHAB PHASE I   PRE:  Rate/Rhythm: 82 NSR  BP:  Sitting: 145/66      SaO2: 94 RA  MODE:  Ambulation: 470 ft   POST:  Rate/Rhythm: 92 NSR  BP:  Sitting: 161/71      SaO2: 97 RA  Pt tolerated exercise well and amb 470 ft with RW, and stand-by assist. Pt denies CP, SOB, or dizziness throughout walk. Pt placed in chair after walk. Call bell in reach. Pt also educated on sternal precautions, incision care, IS use, heart healthy diet, and exercise guidelines. Pt and daughter had no questions.  CRPII referral placed for [facility]. Will continue to follow.  [id number] Shari Lee, RRT, BSRT 07/09/2024 10:25 AM

## 2024-07-10 LAB — GLUCOSE, CAPILLARY
Glucose-Capillary: 116 mg/dL — ABNORMAL HIGH (ref 70–99)
Glucose-Capillary: 173 mg/dL — ABNORMAL HIGH (ref 70–99)
Glucose-Capillary: 93 mg/dL (ref 70–99)
Glucose-Capillary: 169 mg/dL — ABNORMAL HIGH (ref 70–99)

## 2024-07-10 MED ORDER — METOPROLOL TARTRATE 25 MG/10 ML ORAL SUSPENSION
12.5000 mg | Freq: Two times a day (BID) | ORAL | Status: DC
Start: 2024-07-10 — End: 2024-07-10

## 2024-07-10 MED ORDER — METOPROLOL TARTRATE 25 MG PO TABS
37.5000 mg | ORAL_TABLET | Freq: Two times a day (BID) | ORAL | Status: DC
  Administered 2024-07-10 – 2024-07-11 (×2): 37.5 mg via ORAL
  Filled 2024-07-10 (×2): qty 1

## 2024-07-10 MED ORDER — ACETAMINOPHEN 500 MG PO TABS
1000.0000 mg | ORAL_TABLET | Freq: Four times a day (QID) | ORAL | Status: DC
  Filled 2024-07-10: qty 2

## 2024-07-10 MED ORDER — ACETAMINOPHEN 160 MG/5ML PO SOLN: 1000.0000 mg | Freq: Four times a day (QID) | ORAL | Status: DC

## 2024-07-10 NOTE — Evaluation (Signed)
 Physical Therapy Brief Evaluation and Discharge Note Patient Details Name: Shari Lee MRN: [id number] DOB: 07/15/1956 Today's Date: 07/10/2024   History of Present Illness  68 yo F adm 07/05/24 for AVR. PMHx: aortic stenosis, congenital deafness, T2DM, HTn, HLD, sleep apnea  Clinical Impression  Pt very pleasant, aware of sternal precautions with education for move in the tube. Pt lives alone but with stay with her daughter at D/C. Toilet at home is low and would benefit from [facility]. Pt educated for all transfers, walking, stairs, precautions and progressive walking program with pt able to voice understanding. No further needs at this time will defer to mobility specialists and cardiac rehab.   HR 78-89        PT Assessment Patient does not need any further PT services  Assistance Needed at Discharge  PRN    Equipment Recommendations BSC/3in1  Recommendations for Other Services       Precautions/Restrictions Precautions Precautions: Sternal Precaution Booklet Issued: No Recall of Precautions/Restrictions: Intact Precaution/Restrictions Comments: pt aware and has heart book Restrictions Weight Bearing Restrictions Per Provider Order: No        Mobility  Bed Mobility   Supine/Sidelying to sit: Modified independent (Device/Increased time) Sit to supine/sidelying: Modified independent (Device/Increased time) General bed mobility comments: pt able to enter and exit flat bed without assist with adherence to precautions  Transfers Overall transfer level: Modified independent                      Ambulation/Gait Ambulation/Gait assistance: Modified independent (Device/Increase time) Gait Distance (Feet): 450 Feet Assistive device: None Gait Pattern/deviations: Step-through pattern, Decreased stride length Gait Speed: Pace WFL General Gait Details: pt with steady gait without RW, limited by fatigue with max HR 89 and SPO2 >95% on RA  Home Activity  Instructions    Stairs Stairs: Yes Stairs assistance: Modified independent (Device/Increase time) Stair Management: One rail Left, Alternating pattern, Forwards Number of Stairs: 5    Modified Rankin (Stroke Patients Only)        Balance Overall balance assessment: No apparent balance deficits (not formally assessed)                        Pertinent Vitals/Pain PT - Brief Vital Signs All Vital Signs Stable: Yes (HR 78-89, SPO2 98% on RA) Pain Assessment Pain Assessment: No/denies pain     Home Living Family/patient expects to be discharged to:: Private residence Living Arrangements: Alone Available Help at Discharge: Family;Available 24 hours/day Home Environment: Stairs to enter  [employer] of Steps: 5 Home Equipment: Agricultural consultant (2 wheels)   Additional Comments: pt lives alone but plans to go stay with daughter near [place] at D/C with 24hr supervision, 5 steps to enter and built in shower seat    Prior Function Level of Independence: Independent      UE/LE Assessment   UE ROM/Strength/Tone/Coordination: [facility]    LE ROM/Strength/Tone/Coordination: [facility]      Communication   Communication Communication: Impaired Factors Affecting Communication: Hearing impaired     Cognition Overall Cognitive Status: Appears within functional limits for tasks assessed/performed       General Comments      Exercises     Assessment/Plan    PT Problem List         PT Visit Diagnosis Other abnormalities of gait and mobility (R26.89)    No Skilled PT All education completed;Patient is modified independent with all activity/mobility;Patient will have necessary level  of assist by caregiver at discharge   Co-evaluation                AMPAC 6 Clicks Help needed turning from your back to your side while in a flat bed without using bedrails?: None Help needed moving from lying on your back to sitting on the side of a flat bed without using  bedrails?: None Help needed moving to and from a bed to a chair (including a wheelchair)?: None Help needed standing up from a chair using your arms (e.g., wheelchair or bedside chair)?: A Little Help needed to walk in hospital room?: A Little Help needed climbing 3-5 steps with a railing? : None 6 Click Score: 22      End of Session   Activity Tolerance: Patient tolerated treatment well Patient left: in chair;with call bell/phone within reach Nurse Communication: Mobility status PT Visit Diagnosis: Other abnormalities of gait and mobility (R26.89)     Time: 1152-1207 PT Time Calculation (min) (ACUTE ONLY): 15 min  Charges:   PT Evaluation $PT Eval Low Complexity: 1 Low      Coben Godshall P, PT Acute Rehabilitation Services Office: [phone]   Lenoard NOVAK Noga Fogg  07/10/2024, 12:20 PM

## 2024-07-10 NOTE — Progress Notes (Addendum)
 5 Days Post-Op Procedure(s) (LRB): REPLACEMENT, AORTIC VALVE, OPEN USING INSPIRIS VALVE (N/A) ECHOCARDIOGRAM, TRANSESOPHAGEAL (N/A) Subjective: Feels fair, had eposode of afib w/ RVR yesterday evening Still not walking much Objective: Vital signs in last 24 hours: Temp:  [97.6 F (36.4 C)-98.8 F (37.1 C)] 97.6 F (36.4 C) (09/23 0728) Pulse Rate:  [65-92] 88 (09/23 0728) Cardiac Rhythm: Atrial flutter (09/22 1905) Resp:  [19-24] 24 (09/23 0728) BP: (103-169)/(59-75) 140/59 (09/23 0728) SpO2:  [95 %-97 %] 96 % (09/23 0728) Weight:  [90.9 kg] 90.9 kg (09/23 0425)  Hemodynamic parameters for last 24 hours:    Intake/Output from previous day: 09/22 0701 - 09/23 0700 In: 243 [P.O.:240; I.V.:3] Out: -  Intake/Output this shift: Total I/O In: 240 [P.O.:240] Out: -   General appearance: alert, cooperative, and no distress Heart: regular rate and rhythm Lungs: mildly dim in bases Abdomen: benign Extremities: no edema Wound: incis healing well  Lab Results: Recent Labs    07/08/24 0246  WBC 17.5*  HGB 9.5*  HCT 29.3*  PLT 226   BMET:  Recent Labs    07/08/24 0246  NA 134*  K 3.7  CL 100  CO2 25  GLUCOSE 152*  BUN 11  CREATININE 0.78  CALCIUM  8.3*    PT/INR: No results for input(s): LABPROT, INR in the last 72 hours. ABG    Component Value Date/Time   PHART 7.271 (L) 07/05/2024 1701   HCO3 19.3 (L) 07/05/2024 1701   TCO2 21 (L) 07/05/2024 1701   ACIDBASEDEF 7.0 (H) 07/05/2024 1701   O2SAT 91 07/05/2024 1701   CBG (last 3)  Recent Labs    07/09/24 1701 07/09/24 2100 07/10/24 0631  GLUCAP 129* 195* 116*    Meds Scheduled Meds:  acetaminophen   1,000 mg Oral Q6H   Or   acetaminophen  (TYLENOL ) oral liquid 160 mg/5 mL  1,000 mg Per Tube Q6H   amiodarone   400 mg Oral BID   amLODipine   10 mg Oral Daily   And   benazepril   40 mg Oral Daily   aspirin  EC  325 mg Oral Daily   Or   aspirin   324 mg Per Tube Daily   bisacodyl   10 mg Oral  Daily   Or   bisacodyl   10 mg Rectal Daily   Chlorhexidine  Gluconate Cloth  6 each Topical Daily   dapagliflozin  propanediol  10 mg Oral Daily   docusate sodium   200 mg Oral Daily   enoxaparin  (LOVENOX ) injection  40 mg Subcutaneous Q24H   ezetimibe   10 mg Oral Daily   insulin  aspart  0-24 Units Subcutaneous TID AC & HS   insulin  glargine  14 Units Subcutaneous Daily   loratadine   10 mg Oral Daily   metoprolol  tartrate  25 mg Oral BID   Or   metoprolol  tartrate  12.5 mg Per Tube BID   montelukast   10 mg Oral QHS   pantoprazole   40 mg Oral Daily   sodium chloride  flush  10-40 mL Intracatheter Q12H   sodium chloride  flush  3 mL Intravenous Q12H   sodium chloride  flush  3 mL Intravenous Q12H   Continuous Infusions:  albumin  human Stopped (07/06/24 0701)   PRN Meds:.albumin  human, dextrose , hydrALAZINE , metoprolol  tartrate, midazolam , morphine  injection, ondansetron  (ZOFRAN ) IV, oxyCODONE , sodium chloride  flush, sodium chloride  flush, sodium chloride  flush, traMADol   Xrays No results found.  Assessment/Plan: S/P Procedure(s) (LRB): REPLACEMENT, AORTIC VALVE, OPEN USING INSPIRIS VALVE (N/A) ECHOCARDIOGRAM, TRANSESOPHAGEAL (N/A) POD#5  1 afeb, BP remains  high- meds adjusted to higher dose yesterday, will see how this does, + episode of Afib w/ RVR- may need to consider ACRx , cont amio 2 sats good on RA 3 voiding - amts not recorded 4 BS well controlled 5 lovenox  for DVT ppx 6 will get PT consult as she isn't walking that much to assess home needs 7 routine pulm hygiene     LOS: 5 days    Shari Lee Pager [phone] 07/10/2024  Back in sinus, continue amio load PT/OT Dispo planning  Shari Lee

## 2024-07-11 ENCOUNTER — Other Ambulatory Visit (HOSPITAL_COMMUNITY): Payer: Self-pay

## 2024-07-11 DIAGNOSIS — I4891 Unspecified atrial fibrillation: Secondary | ICD-10-CM | POA: Insufficient documentation

## 2024-07-11 LAB — GLUCOSE, CAPILLARY
Glucose-Capillary: 120 mg/dL — ABNORMAL HIGH (ref 70–99)
Glucose-Capillary: 139 mg/dL — ABNORMAL HIGH (ref 70–99)

## 2024-07-11 MED ORDER — ASPIRIN 325 MG PO TBEC: 325.0000 mg | DELAYED_RELEASE_TABLET | Freq: Every day | ORAL | Status: AC

## 2024-07-11 MED ORDER — CEPHALEXIN 500 MG PO CAPS
500.0000 mg | ORAL_CAPSULE | Freq: Three times a day (TID) | ORAL | 0 refills | Status: DC
  Filled 2024-07-11: qty 21, 7d supply, fill #0

## 2024-07-11 MED ORDER — OXYCODONE HCL 5 MG PO TABS
5.0000 mg | ORAL_TABLET | Freq: Four times a day (QID) | ORAL | 0 refills | Status: DC | PRN
  Filled 2024-07-11: qty 28, 7d supply, fill #0

## 2024-07-11 MED ORDER — BENAZEPRIL HCL 40 MG PO TABS
40.0000 mg | ORAL_TABLET | Freq: Every day | ORAL | 1 refills | Status: DC
  Filled 2024-07-11: qty 30, 30d supply, fill #0

## 2024-07-11 MED ORDER — METOPROLOL TARTRATE 25 MG PO TABS
37.5000 mg | ORAL_TABLET | Freq: Two times a day (BID) | ORAL | 1 refills | Status: DC
  Filled 2024-07-11: qty 90, 30d supply, fill #0

## 2024-07-11 MED ORDER — TRESIBA FLEXTOUCH 100 UNIT/ML ~~LOC~~ SOPN: 25.0000 [IU] | PEN_INJECTOR | Freq: Two times a day (BID) | SUBCUTANEOUS | Status: DC

## 2024-07-11 MED ORDER — CEPHALEXIN 500 MG PO CAPS
500.0000 mg | ORAL_CAPSULE | Freq: Three times a day (TID) | ORAL | Status: DC
  Administered 2024-07-11: 500 mg via ORAL
  Filled 2024-07-11: qty 1

## 2024-07-11 MED ORDER — HYDROCHLOROTHIAZIDE 12.5 MG PO TABS
12.5000 mg | ORAL_TABLET | Freq: Every day | ORAL | Status: DC
  Filled 2024-07-11 (×2): qty 1

## 2024-07-11 MED ORDER — AMIODARONE HCL 200 MG PO TABS
400.0000 mg | ORAL_TABLET | Freq: Two times a day (BID) | ORAL | 1 refills | Status: DC
  Filled 2024-07-11: qty 70, 30d supply, fill #0

## 2024-07-11 MED ORDER — HYDROCHLOROTHIAZIDE 12.5 MG PO TABS
12.5000 mg | ORAL_TABLET | Freq: Every day | ORAL | 1 refills | Status: DC
  Filled 2024-07-11: qty 30, 30d supply, fill #0

## 2024-07-11 MED ORDER — AMLODIPINE BESYLATE 10 MG PO TABS
10.0000 mg | ORAL_TABLET | Freq: Every day | ORAL | 1 refills | Status: DC
  Filled 2024-07-11: qty 30, 30d supply, fill #0

## 2024-07-11 MED FILL — Calcium Chloride Inj 10%: INTRAVENOUS | Qty: 10 | Status: AC

## 2024-07-11 MED FILL — Sodium Chloride IV Soln 0.9%: INTRAVENOUS | Qty: 2000 | Status: AC

## 2024-07-11 MED FILL — Electrolyte-R (PH 7.4) Solution: INTRAVENOUS | Qty: 3000 | Status: AC

## 2024-07-11 MED FILL — Sodium Bicarbonate IV Soln 8.4%: INTRAVENOUS | Qty: 50 | Status: AC

## 2024-07-11 MED FILL — Heparin Sodium (Porcine) Inj 1000 Unit/ML: INTRAMUSCULAR | Qty: 10 | Status: AC

## 2024-07-11 MED FILL — Mannitol IV Soln 20%: INTRAVENOUS | Qty: 500 | Status: AC

## 2024-07-11 NOTE — TOC Transition Note (Signed)
 Transition of Care (TOC) - Discharge Note Rayfield Gobble RN, BSN Inpatient Care Management Unit 4E- RN Case Manager See Treatment Team for direct phone #   Patient Details  Name: Shari Lee MRN: [id number] Date of Birth: [birth year]  Transition of [facility]) CM/SW Contact:  Gobble Rayfield Hurst, RN Phone Number: 07/11/2024, 12:06 PM   Clinical Narrative:    Pt stable for transition home today, No HH or DME needs noted. TCTS office had made office referral to [facility]- liaison updated that pt had no HH needs on discharge.   Family to transport home   Final next level of care: Home/Self Care Barriers to Discharge: No Barriers Identified   Patient Goals and CMS Choice Patient states their goals for this hospitalization and ongoing recovery are:: return home   Choice offered to / list presented to : NA      Discharge Placement                 Home      Discharge Plan and Services Additional resources added to the After Visit Summary for   In-house Referral: NA Discharge Planning Services: CM Consult Post Acute Care Choice: NA          DME Arranged: N/A DME Agency: NA       HH Arranged: NA HH Agency: NA        Social Drivers of Health (SDOH) Interventions SDOH Screenings   Food Insecurity: No Food Insecurity (07/06/2024)  Housing: Low Risk  (07/06/2024)  Transportation Needs: No Transportation Needs (07/06/2024)  Utilities: Not At Risk (07/06/2024)  Alcohol Screen: Low Risk  (03/21/2023)  Depression (PHQ2-9): Low Risk  (03/21/2023)  Financial Resource Strain: High Risk (10/19/2023)  Physical Activity: Unknown (10/19/2023)  Social Connections: Moderately Integrated (07/06/2024)  Stress: No Stress Concern Present (10/19/2023)  Recent Concern: Stress - Stress Concern Present (09/06/2023)  Tobacco Use: Low Risk  (07/05/2024)     Readmission Risk Interventions     No data to display

## 2024-07-11 NOTE — Progress Notes (Signed)
 [address] [employer] [address]             [phone]     6 Days Post-Op Procedure(s) (LRB): REPLACEMENT, AORTIC VALVE, OPEN USING INSPIRIS VALVE (N/A) ECHOCARDIOGRAM, TRANSESOPHAGEAL (N/A) Subjective: Feels ok, no further afib  Objective: Vital signs in last 24 hours: Temp:  [97.5 F (36.4 C)-98.2 F (36.8 C)] 98.2 F (36.8 C) (09/24 0400) Pulse Rate:  [69-88] 70 (09/24 0400) Cardiac Rhythm: Normal sinus rhythm (09/24 0400) Resp:  [16-24] 21 (09/24 0400) BP: (131-167)/(57-68) 146/62 (09/24 0400) SpO2:  [95 %-98 %] 95 % (09/24 0400) Weight:  [92.8 kg] 92.8 kg (09/24 0500)  Hemodynamic parameters for last 24 hours:    Intake/Output from previous day: 09/23 0701 - 09/24 0700 In: 1080 [P.O.:1080] Out: -  Intake/Output this shift: No intake/output data recorded.  General appearance: alert, cooperative, and no distress Heart: regular rate and rhythm and soft systolic murmur Lungs: clear to auscultation bilaterally Abdomen: benign Extremities: no edema Wound: incis healing well Right hand phlebitis looks a little more erethematous  Lab Results: No results for input(s): WBC, HGB, HCT, PLT in the last 72 hours. BMET: No results for input(s): NA, K, CL, CO2, GLUCOSE, BUN, CREATININE, CALCIUM  in the last 72 hours.  PT/INR: No results for input(s): LABPROT, INR in the last 72 hours. ABG    Component Value Date/Time   PHART 7.271 (L) 07/05/2024 1701   HCO3 19.3 (L) 07/05/2024 1701   TCO2 21 (L) 07/05/2024 1701   ACIDBASEDEF 7.0 (H) 07/05/2024 1701   O2SAT 91 07/05/2024 1701   CBG (last 3)  Recent Labs    07/10/24 1649 07/10/24 2022 07/11/24 0006  GLUCAP 93 169* 139*    Meds Scheduled Meds:  acetaminophen   1,000 mg Oral Q6H   Or   acetaminophen  (TYLENOL ) oral liquid 160 mg/5 mL  1,000 mg Per Tube Q6H   amiodarone   400 mg Oral BID   amLODipine   10 mg Oral Daily   And   benazepril   40 mg Oral  Daily   aspirin  EC  325 mg Oral Daily   Or   aspirin   324 mg Per Tube Daily   bisacodyl   10 mg Oral Daily   Or   bisacodyl   10 mg Rectal Daily   Chlorhexidine  Gluconate Cloth  6 each Topical Daily   dapagliflozin  propanediol  10 mg Oral Daily   docusate sodium   200 mg Oral Daily   enoxaparin  (LOVENOX ) injection  40 mg Subcutaneous Q24H   ezetimibe   10 mg Oral Daily   insulin  aspart  0-24 Units Subcutaneous TID AC & HS   insulin  glargine  14 Units Subcutaneous Daily   loratadine   10 mg Oral Daily   metoprolol  tartrate  37.5 mg Oral BID   montelukast   10 mg Oral QHS   pantoprazole   40 mg Oral Daily   Continuous Infusions:  albumin  human Stopped (07/06/24 0701)   PRN Meds:.albumin  human, dextrose , hydrALAZINE , metoprolol  tartrate, midazolam , morphine  injection, ondansetron  (ZOFRAN ) IV, oxyCODONE , sodium chloride  flush, traMADol   Xrays No results found.  Assessment/Plan: S/P Procedure(s) (LRB): REPLACEMENT, AORTIC VALVE, OPEN USING INSPIRIS VALVE (N/A) ECHOCARDIOGRAM, TRANSESOPHAGEAL (N/A)  POD#6  1 afeb, VSS, BP still a bit high at times, will add hydrochlorothiazide - may need further outpatient management, maintaining SR 2 O2 sats good on RA 3 voiding well 4 BS adeq control- home meds at D/C 5 will add keflex  for early  right hand cellulitis 6 stable for D/C- reviewed instructions    LOS: 6 days    Lemond FORBES Cera PA-C Pager [phone] 07/11/2024

## 2024-07-11 NOTE — Progress Notes (Signed)
 D/C education provided, PIV removed, CCMD notified, patient is aware regarding follow up visits and TOC medications. Informed volunteers service to pick up her TOC medications on the way, had no any concerns during d/c.

## 2024-07-12 ENCOUNTER — Other Ambulatory Visit: Payer: Self-pay | Admitting: Pharmacist

## 2024-07-12 ENCOUNTER — Encounter: Payer: Self-pay | Admitting: Pharmacist

## 2024-07-12 LAB — ECHO INTRAOPERATIVE TEE
AR max vel: 1.31 cm2
AV Area VTI: 1.15 cm2
AV Area mean vel: 1.19 cm2
AV Mean grad: 41.5 mmHg
AV Peak grad: 62.1 mmHg
Ao pk vel: 3.94 m/s
Height: 62 in
MV VTI: 3.37 cm2
Weight: 3280 [oz_av]

## 2024-07-12 NOTE — Progress Notes (Signed)
 07/12/2024 Name: Layce Sprung MRN: [id number] DOB: [birth year]  Chief Complaint  Patient presents with   Diabetes   Medication Management    Shari Lee is a 68 y.o. year old female who presented for a telephone visit.   They were referred to the pharmacist by their PCP for assistance in managing diabetes and medication access.    Subjective:   Medication Access/Adherence  Current Pharmacy:  [facility] [address]), [place] - [address] [place]) [place] [address] Phone: [phone] Fax: [phone]  [facility] - [place], [place] - [address] [place] [address] Phone: [phone] Fax: [phone]  [name] [address] [place] [address] Phone: [phone] Fax: [phone]   Patient reports affordability concerns with their medications: Yes  Patient reports access/transportation concerns to their pharmacy: No  Patient reports adherence concerns with their medications:  Yes     Patient was recently hospitalized 09/18/205 to 07/11/2024 for Aortic Valve Replacement:  During hospitalization she developed atrial fibrillation and also had difficult to control hypertension.  During hospital stay her insulin  regimen was Lantus  15 units once a day and insulin  aspart  (novolog )- per sliding scale.  CBG  < 120 (dose in units): 0   CBG 120 - 160 (dose in units): 2   CBG 161 - 200 (dose in units): 4   CBG 201 - 250 (dose in units): 8   CBG 251 - 300 (dose in units): 12   CBG 301 - 350 (dose in units) 16   CBG 351 - 450 (dose in units): 20   CBG  > 450 24 units and obtain STAT glucose and notify MD    She had several medication changes at discharge - patient states she has all her meds filled thru the [facility] Ph[facility] before she left the hospital for 30 days.   Medications Stopped at Discharge:  Atenolol  Amlodipine -benazepril  combo  5/20mg  Chlorohexadine mouthwash  Medications Started at Discharge:  Amiodarone  200mg  - take 400mg  twice a day for 5 days, then take 400mg  daily Amlodipine  10mg  daily  Aspirin  325mg  daily  Benazepril  40mg  daily  Keflext 500mg  every 8 hours Hydrochlorothiazide  12.5mg  daily Metoprolol  25mg  take 1.5 tabs = 37.5mg  twice a day Oxycodone  5mg  q6 hours.     Diabetes:  Current medications:  [name]  25 units twice a day prior ot hospitalization but patient is unsure how to take now that she has been discharged.  - approved to get thru [employer] thru 10/17/2024 Novolog  / insulin  aspart 9 units with meals (started short acting Friday 05/18/2024) prior to hospitalization but paient is not sure how to take since discharge. Received first delivery of Novolog  / insulin  aspart in our office 05/2024. Farxiga  10mg  daily - getting from [facility] patient assistance program - approved thru 10/17/2024.    She was previously following a very low CHO but high meat / protein diet. Blood glucose was really well controlled but she found this type of diet was hard to sustain. She reports she tired of eating so much meat. She has been trying to limit her carbohydrate intake.    Medications tried in the past: Ozempic  - stopped due to pancreatitis; metformin  - worsened IBS related diarrhea   Diet - recent changed noted: She has been eating 3 smaller meals since hospitalization rather than 2 larger meals per day.   Date of Download:  06/29/2024 to 07/12/2024 % Time CGM is active: 53%  - has been in hospital 9/18 to 9/24 Average Glucose: 179 mg/dL (previously 824 mg/dL) Glucose Management Indicator: 7.6% (preciously was 7.5%) Glucose Variability: 28.3% (goal <36%)  Time in Goal:  - Time in range 70-180: 54% (previously 61%) - Time in high range 181 to 250: 38% - Time in very high range > 250: 8% (previously was 39%) - Time below range: 0% ( previously 1%)  Blood glucose trends - not enough data post discharge to  assess definite trends. She did have a low during the night last night. Blood glucose rose after her breakfast today but not as much as in the past (sometimes she would see spikes close to 300).            Wt Readings from Last 3 Encounters:  07/11/24 204 lb 9.4 oz (92.8 kg)  07/03/24 205 lb 14.4 oz (93.4 kg)  06/15/24 204 lb 9.6 oz (92.8 kg)     Objective:  Lab Results  Component Value Date   HGBA1C 8.9 (H) 07/03/2024   BP Readings from Last 3 Encounters:  07/11/24 (!) 153/59  07/03/24 (!) 150/66  06/15/24 (!) 157/81     Lab Results  Component Value Date   CREATININE 0.78 07/08/2024   BUN 11 07/08/2024   NA 134 (L) 07/08/2024   K 3.7 07/08/2024   CL 100 07/08/2024   CO2 25 07/08/2024    Lab Results  Component Value Date   CHOL 189 01/24/2024   HDL 55.40 01/24/2024   LDLCALC 110 (H) 01/24/2024   TRIG 119.0 01/24/2024   CHOLHDL 3 01/24/2024    Medications Reviewed Today   Medications were not reviewed in this encounter       Assessment/Plan:  Diabetes: blood glucose and GMI over the last 2 weeks has not been at goal but blood glucose is trending down since starting short acting insulin  - Restart [name]  at 15 units once a day - will adjust based on home Continuous Glucose Monitor report - Restart Novolog  / insulin  aspart ata 4 units prior to each meal. Hold if blood glucose is 100 or less.  - Continue Farxiga  10mg  daily.  - Continue to use Libre 3 sensors to check blood glucose continuously.    Follow Up Plan: 5 days.   [name], PharmD Clinical Pharmacist [facility]  Population Health [phone]

## 2024-07-13 ENCOUNTER — Ambulatory Visit
Attending: Thoracic Surgery (Cardiothoracic Vascular Surgery) | Admitting: Thoracic Surgery (Cardiothoracic Vascular Surgery)

## 2024-07-13 DIAGNOSIS — I350 Nonrheumatic aortic (valve) stenosis: Secondary | ICD-10-CM

## 2024-07-13 DIAGNOSIS — Z952 Presence of prosthetic heart valve: Secondary | ICD-10-CM

## 2024-07-13 MED ORDER — FUROSEMIDE 40 MG PO TABS: 40.0000 mg | ORAL_TABLET | Freq: Every day | ORAL | 0 refills | Status: DC

## 2024-07-13 NOTE — Progress Notes (Signed)
     [address]       [name] [address]             [phone]       Patient: Home Provider: Office Consent for Telemedicine visit obtained.  Today's visit was completed via a real-time telehealth (see specific modality noted below). The patient/authorized person provided oral consent at the time of the visit to engage in a telemedicine encounter with the present provider at [facility]. The patient/authorized person was informed of the potential benefits, limitations, and risks of telemedicine. The patient/authorized person expressed understanding that the laws that protect confidentiality also apply to telemedicine. The patient/authorized person acknowledged understanding that telemedicine does not provide emergency services and that he or she would need to call 911 or proceed to the nearest hospital for help if such a need arose.   Total time spent in the clinical discussion 10 minutes.  Telehealth Modality: Phone visit (audio only)  I had a telephone visit with  [name] who is s/p AVR.  Overall doing well.  Pain is minimal.  Ambulating well. Vitals have been stable.  She has been having some swelling in her legs.  I have written her for lasix .  We will touch base with her in 1 week for follow-up.  Jonathan Kirkendoll will see us  back in 1 month with a chest x-ray for cardiac rehab clearance.  [name]

## 2024-07-17 ENCOUNTER — Other Ambulatory Visit: Payer: Self-pay | Admitting: Pharmacist

## 2024-07-17 ENCOUNTER — Ambulatory Visit: Admitting: Cardiology

## 2024-07-17 NOTE — Progress Notes (Signed)
 07/17/2024 Name: Shari Lee MRN: [id number] DOB: [birth year]  Chief Complaint  Patient presents with   Diabetes    Shari Lee is a 68 y.o. year old female who presented for a telephone visit.   Shari Lee.    Subjective:   Medication Lee/Adherence  Current Pharmacy:  [facility] [address]), [place] - [address] [place]) [place] [address] Phone: [phone] Fax: [phone]  [facility] - [place], [place] - [address] [place] [address] Phone: [phone] Fax: [phone]  [name] [address] [place] [address] Phone: [phone] Fax: [phone]   Patient reports affordability concerns with their medications: Yes  Patient reports Lee/transportation concerns to their pharmacy: No  Patient reports adherence concerns with their medications:  Yes     Patient was recently hospitalized 09/18/205 to 07/11/2024 for Aortic Valve Replacement:  During hospitalization she developed atrial fibrillation and also had difficult to control hypertension.  During hospital stay her insulin  regimen was Lantus  15 units once a day and insulin  aspart  (novolog )- per sliding scale.  CBG  < 120 (dose in units): 0   CBG 120 - 160 (dose in units): 2   CBG 161 - 200 (dose in units): 4   CBG 201 - 250 (dose in units): 8   CBG 251 - 300 (dose in units): 12   CBG 301 - 350 (dose in units) 16   CBG 351 - 450 (dose in units): 20   CBG  > 450 24 units and obtain STAT glucose and notify MD    She had several medication changes at discharge - patient states she has all her meds filled [facility] before she left the hospital for 30 days.   Medications Stopped at Discharge:  Atenolol  Amlodipine -benazepril  combo 5/20mg  Chlorohexadine  mouthwash  Medications Started at Discharge:  Amiodarone  200mg  - take 400mg  twice a day for 5 days, then take 400mg  daily Amlodipine  10mg  daily  Aspirin  325mg  daily  Benazepril  40mg  daily  Keflex  500mg  every 8 hours Hydrochlorothiazide  12.5mg  daily Metoprolol  25mg  take 1.5 tabs = 37.5mg  twice a day Oxycodone  5mg  q6 hours.     Diabetes:  Current medications:  At last visit recommended she restart [name]  at 15 units once a day - will adjust based on home Continuous Glucose Monitor report ([name]  25 units twice a day prior to hospitalization) - Restart Novolog  / insulin  aspart 4 units prior to each meal. Hold if blood glucose is 100 or less. (Novolog  / insulin  aspart 9 units with meals prior to hospitalization)  - approved to get thru [employer] thru 10/17/2024 Farxiga  10mg  daily - getting from [facility] and [facility] patient assistance program - approved thru 10/17/2024.    She was previously following a very low CHO but high meat / protein diet. Blood glucose was really well controlled but she found this type of diet was hard to sustain. She reports she tired of eating so much meat. She has been trying to limit her carbohydrate intake.    Medications tried in the past: Ozempic  - stopped due to pancreatitis; metformin  - worsened IBS related diarrhea   Diet - recent changed noted: She has been eating 3 smaller meals since hospitalization rather than 2 larger meals per day.   Date of Download: 07/04/2024  to 07/17/2024 % Time CGM is active: 52%  - had been in hospital 9/18 to 9/24 Average Glucose: 181 mg/dL (previously 820 mg/dL) Glucose Management Indicator: 7.6% (preciously was 7.5%) Glucose Variability: 27% (goal <36%)  Time in Goal:  - Time in range 70-180: 55% (previously 54%) - Time in high range 181 to 250: 37% - Time in very high range > 250: 8% (previously was 39%) - Time below range: 0% ( previously 0%)  Blood glucose trends - Patient has a few days of low blood glucose when she was  discharged but non since 07/12/2024. Overnight blood glucose stable but has had some high's after meals. Especially after breakfast and lunch on Monday 9/29 - per patient she ate cereal for breakfast and lunch on 9/29.                Wt Readings from Last 3 Encounters:  07/11/24 204 lb 9.4 oz (92.8 kg)  07/03/24 205 lb 14.4 oz (93.4 kg)  06/15/24 204 lb 9.6 oz (92.8 kg)     Objective:  Lab Results  Component Value Date   HGBA1C 8.9 (H) 07/03/2024   BP Readings from Last 3 Encounters:  07/11/24 (!) 153/59  07/03/24 (!) 150/66  06/15/24 (!) 157/81     Lab Results  Component Value Date   CREATININE 0.78 07/08/2024   BUN 11 07/08/2024   NA 134 (L) 07/08/2024   K 3.7 07/08/2024   CL 100 07/08/2024   CO2 25 07/08/2024    Lab Results  Component Value Date   CHOL 189 01/24/2024   HDL 55.40 01/24/2024   LDLCALC 110 (H) 01/24/2024   TRIG 119.0 01/24/2024   CHOLHDL 3 01/24/2024    Medications Reviewed Today   Medications were not reviewed in this encounter       Assessment/Plan:  Diabetes: blood glucose and GMI over the last 2 weeks has not been at goal but blood glucose is trending down since starting short acting insulin  - Continue [name]  at 15 units once a day - will adjust based on home Continuous Glucose Monitor report - Increase Novolog  / insulin  aspart to 5 units prior to each meal. She will add 2 additional unit if having a meal that is high in carbohydrates - like cereal, pasta, rice, potatoes or bread. Hold if blood glucose is 100 or less.  - Continue Farxiga  10mg  daily.  - Continue to use Libre 3 sensors to check blood glucose continuously.    Follow Up Plan: 3 to 5 days.   [name], PharmD Clinical Pharmacist [facility] Primary [facility]  Population Health [phone]

## 2024-07-20 ENCOUNTER — Other Ambulatory Visit: Payer: Self-pay | Admitting: Pharmacist

## 2024-07-20 NOTE — Progress Notes (Signed)
 07/20/2024 Name: Shari Lee MRN: [id number] DOB: October 26, 1955  Chief Complaint  Patient presents with   Diabetes    Shari Lee is a 68 y.o. year old female who presented for a telephone visit.   They were referred to the pharmacist by their PCP for assistance in managing diabetes and medication access.    Subjective:   Medication Access/Adherence  Current Pharmacy:  [facility] [address]), [place] - [address] [place]) [place] [address] Phone: [phone] Fax: [phone]  [facility] - [place], [place] - [address] [place] [address] Phone: [phone] Fax: [phone]  [name] [address] [place] [address] Phone: [phone] Fax: [phone]   Patient reports affordability concerns with their medications: Yes  Patient reports access/transportation concerns to their pharmacy: No  Patient reports adherence concerns with their medications:  Yes     Patient was recently hospitalized 09/18/205 to 07/11/2024 for Aortic Valve Replacement:  During hospitalization she developed atrial fibrillation and also had difficult to control hypertension.  During hospital stay her insulin  regimen was Lantus  15 units once a day and insulin  aspart  (novolog )- per sliding scale.  CBG  < 120 (dose in units): 0   CBG 120 - 160 (dose in units): 2   CBG 161 - 200 (dose in units): 4   CBG 201 - 250 (dose in units): 8   CBG 251 - 300 (dose in units): 12   CBG 301 - 350 (dose in units) 16   CBG 351 - 450 (dose in units): 20   CBG  > 450 24 units and obtain STAT glucose and notify MD    She had several medication changes at discharge - patient states she has all her meds filled thru [facility] before she left the hospital for 30 days.   Medications Stopped at Discharge:  Atenolol  Amlodipine -benazepril  combo 5/20mg  Chlorohexadine  mouthwash  Medications Started at Discharge:  Amiodarone  200mg  - take 400mg  twice a day for 5 days, then take 400mg  daily Amlodipine  10mg  daily  Aspirin  325mg  daily  Benazepril  40mg  daily  Keflex  500mg  every 8 hours Hydrochlorothiazide  12.5mg  daily Metoprolol  25mg  take 1.5 tabs = 37.5mg  twice a day Oxycodone  5mg  q6 hours.     Diabetes:  Current medications:  [name]  at 15 units once a day since hospital discharge. She was taking [name]  25 units twice a day prior to hospitalization Novolog  / insulin  aspart 5 units prior to each meal. Add 2 units for high carbohydrate meals. Hold if blood glucose is 100 or less. She was taking Novolog  / insulin  aspart 9 units with meals prior to hospitalization  - approved to get thru [employer] thru 10/17/2024 Farxiga  10mg  daily - getting from [facility] and Me patient assistance program - approved thru 10/17/2024.     Medications tried in the past: Ozempic  - stopped due to pancreatitis; metformin  - worsened IBS related diarrhea   Diet - recent changes noted. Eating 3 small meals per day.  Yesterday had waffles for breakfast and noted larger than usual postprandial increase in blood glucose. She took 7 units prior to eating waffles.   Date of Download: 07/07/2024 to 07/20/2024 % Time CGM is active: 65%  - had been in hospital 9/18 to 9/24 Average Glucose: 178 mg/dL (previously 818 mg/dL) Glucose Management Indicator: 7.6% (preciously was 7.6%) Glucose Variability: 25.8% (goal <36%)  Time in  Goal:  - Time in range 70-180: 58% (previously 55%) - Time in high range 181 to 250: 34% - Time in very high range > 250: 8% (previously was 39%) - Time below range: 0% ( previously 0%)  Blood glucose trends - Patient has a few days of low blood glucose when she was discharged but none since 07/12/2024. Overnight blood glucose stable but has had some high's after meals. Especially after breakfast.               Wt Readings from Last 3 Encounters:   07/11/24 204 lb 9.4 oz (92.8 kg)  07/03/24 205 lb 14.4 oz (93.4 kg)  06/15/24 204 lb 9.6 oz (92.8 kg)     Objective:  Lab Results  Component Value Date   HGBA1C 8.9 (H) 07/03/2024   BP Readings from Last 3 Encounters:  07/11/24 (!) 153/59  07/03/24 (!) 150/66  06/15/24 (!) 157/81     Lab Results  Component Value Date   CREATININE 0.78 07/08/2024   BUN 11 07/08/2024   NA 134 (L) 07/08/2024   K 3.7 07/08/2024   CL 100 07/08/2024   CO2 25 07/08/2024    Lab Results  Component Value Date   CHOL 189 01/24/2024   HDL 55.40 01/24/2024   LDLCALC 110 (H) 01/24/2024   TRIG 119.0 01/24/2024   CHOLHDL 3 01/24/2024    Medications Reviewed Today     Reviewed by Carla Milling, RPH-CPP (Pharmacist) on 07/20/24 at 1021  Med List Status: <None>   Medication Order Taking? Sig Documenting Provider Last Dose Status Informant  albuterol  (VENTOLIN  HFA) 108 (90 Base) MCG/ACT inhaler [id number] Yes Inhale 2 puffs into the lungs every 6 (six) hours as needed for wheezing or shortness of breath. Emilio Kelly DASEN, FNP  Active Self  amiodarone  (PACERONE ) 200 MG tablet [id number] Yes Take 2 tablets (400mg  total) by mouth 2 (two) times daily For 5 days, then take 2 tablets (400mg ) once daily. Gold, Wayne E, PA-C  Active   amLODipine  (NORVASC ) 10 MG tablet [id number] Yes Take 1 tablet (10 mg total) by mouth daily. Viviane Lemond BRAVO, PA-C  Active   aspirin  EC 325 MG tablet [id number] Yes Take 1 tablet (325 mg total) by mouth daily. Gold, Wayne E, PA-C  Active   benazepril  (LOTENSIN ) 40 MG tablet [id number] Yes Take 1 tablet (40 mg total) by mouth daily. Gold, Wayne E, PA-C  Active   Blood Glucose Monitoring Suppl (CONTOUR NEXT USB MONITOR) w/Device KIT [id number]  1 kit by Does not apply route daily. To check blood sugar once daily.  Patient not taking: Reported on 07/12/2024   Vivienne Delon CHRISTELLA DEVONNA  Active Self  CareTouch Safety Lancets 26G MISC [id number]  1 Device by Does not apply route daily. To check  blood sugar once daily.  Patient not taking: Reported on 07/12/2024   Vivienne Delon CHRISTELLA, [place]  Active Self   Patient not taking:   Discontinued 07/20/24 1020 (Completed Course)   cetirizine  (ZYRTEC ) 10 MG tablet [id number] Yes Take 1 tablet by mouth once daily Cyndi Shaver, PA-C  Active Self  Continuous Glucose Sensor (FREESTYLE LIBRE 3 PLUS SENSOR) MISC [id number] Yes USE AS DIRECTED. CHANGE SENSOR EVERY 15 DAYS. Cyndi Shaver, PA-C  Active Self  dapagliflozin  propanediol (FARXIGA ) 10 MG TABS tablet [id number] Yes Take 1 tablet (10 mg total) by mouth daily. For 2025 [place] Patient Assistance Program Cyndi Shaver, [place]  Active Self  ezetimibe  (ZETIA ) 10 MG tablet [id number] Yes Take 1 tablet (10  mg total) by mouth daily. Cyndi Shaver, PA-C  Active Self  furosemide  (LASIX ) 40 MG tablet [id number]  Take 1 tablet (40 mg total) by mouth daily. Shyrl Linnie KIDD, MD  Active   glucose blood (CONTOUR NEXT TEST) test strip [id number]  To check blood sugar once daily.  Patient not taking: Reported on 07/12/2024   Burnette, Jennifer M, PA-C  Active Self  hydrochlorothiazide  (HYDRODIURIL ) 12.5 MG tablet [id number] Yes Take 1 tablet (12.5 mg total) by mouth daily. Gold, Wayne E, PA-C  Active   insulin  aspart (NOVOLOG ) 100 UNIT/ML FlexPen [id number] Yes Inject 4 to 10 units up to 3 times a day with a meal. Cyndi, Shaver, PA-C  Active Self  insulin  degludec ([name]  FLEXTOUCH) 100 UNIT/ML FlexTouch Pen [id number] Yes Inject 15 Units into the skin daily. Carla Milling, RPH-CPP  Active   Insulin  Pen Needle (NOVOFINE PLUS) 32G X 4 MM MISC [id number] Yes To use with ozempic  injections Burnette, Jennifer M, PA-C  Active Self  metoprolol  tartrate (LOPRESSOR ) 25 MG tablet [id number] Yes Take 1.5 tablets (37.5 mg total) by mouth 2 (two) times daily. Gold, Wayne E, PA-C  Active   montelukast  (SINGULAIR ) 10 MG tablet [id number] Yes TAKE 1 TABLET BY MOUTH AT BEDTIME Cyndi Shaver, PA-C  Active Self  omeprazole  (PRILOSEC OTC) 20 MG tablet [id number] Yes Take 20 mg by mouth daily. [provider]  Active Self  oxyCODONE  (OXY IR/ROXICODONE ) 5 MG immediate release tablet [id number]  Take 1 tablet (5 mg total) by mouth every 6 (six) hours as needed for severe pain (pain score 7-10). Viviane Lemond BRAVO, PA-C  Active               Assessment/Plan:  Diabetes: blood glucose and GMI over the last 2 weeks has not been at goal but blood glucose is trending down since starting short acting insulin  - Continue [name]  at 15 units once a day - will adjust based on home Continuous Glucose Monitor report - Adjust Novolog  / insulin  aspart to 5 units prior to each meal. She will add 3 additional unit if having a meal that is high in carbohydrates - like cereal, pasta, rice, potatoes or bread. Hold if blood glucose is 100 or less.  - Continue Farxiga  10mg  daily.  - Continue to use Libre 3 sensors to check blood glucose continuously.    Follow Up Plan: 1 week   Milling Carla, PharmD Clinical Pharmacist [facility] Primary Care  Population Health [phone]

## 2024-07-26 ENCOUNTER — Ambulatory Visit: Attending: Cardiology | Admitting: Cardiology

## 2024-07-26 ENCOUNTER — Encounter: Payer: Self-pay | Admitting: Cardiology

## 2024-07-26 VITALS — BP 110/52 | HR 63 | Ht 62.0 in | Wt 196.6 lb

## 2024-07-26 DIAGNOSIS — I2510 Atherosclerotic heart disease of native coronary artery without angina pectoris: Secondary | ICD-10-CM | POA: Diagnosis not present

## 2024-07-26 DIAGNOSIS — Z794 Long term (current) use of insulin: Secondary | ICD-10-CM

## 2024-07-26 DIAGNOSIS — I4891 Unspecified atrial fibrillation: Secondary | ICD-10-CM

## 2024-07-26 DIAGNOSIS — E1159 Type 2 diabetes mellitus with other circulatory complications: Secondary | ICD-10-CM

## 2024-07-26 DIAGNOSIS — I350 Nonrheumatic aortic (valve) stenosis: Secondary | ICD-10-CM | POA: Diagnosis not present

## 2024-07-26 DIAGNOSIS — E1169 Type 2 diabetes mellitus with other specified complication: Secondary | ICD-10-CM

## 2024-07-26 DIAGNOSIS — Z952 Presence of prosthetic heart valve: Secondary | ICD-10-CM

## 2024-07-26 DIAGNOSIS — I152 Hypertension secondary to endocrine disorders: Secondary | ICD-10-CM

## 2024-07-26 DIAGNOSIS — E118 Type 2 diabetes mellitus with unspecified complications: Secondary | ICD-10-CM

## 2024-07-26 DIAGNOSIS — E785 Hyperlipidemia, unspecified: Secondary | ICD-10-CM

## 2024-07-26 DIAGNOSIS — I9789 Other postprocedural complications and disorders of the circulatory system, not elsewhere classified: Secondary | ICD-10-CM

## 2024-07-26 MED ORDER — HYDROCHLOROTHIAZIDE 12.5 MG PO TABS: 12.5000 mg | ORAL_TABLET | Freq: Every day | ORAL | 3 refills | Status: AC

## 2024-07-26 MED ORDER — BENAZEPRIL HCL 40 MG PO TABS: 40.0000 mg | ORAL_TABLET | Freq: Every day | ORAL | 3 refills | Status: AC

## 2024-07-26 MED ORDER — METOPROLOL TARTRATE 25 MG PO TABS: 37.5000 mg | ORAL_TABLET | Freq: Two times a day (BID) | ORAL | 3 refills | Status: DC

## 2024-07-26 MED ORDER — AMLODIPINE BESYLATE 10 MG PO TABS: 10.0000 mg | ORAL_TABLET | Freq: Every day | ORAL | 3 refills | Status: AC

## 2024-07-26 NOTE — Progress Notes (Signed)
 Cardiology Office Note   Date:  07/26/2024  ID:  Juanda Luba, DOB 06-24-1956, MRN [id number] PCP: Cyndi Shaver, PA-C (Inactive)  [facility] Providers Cardiologist:  Redell Cave, MD Cardiology APP:  Gerard Frederick, NP     History of Present Illness Shari Lee is a 68 y.o. female with a past medical history of primary hypertension, type 2 diabetes, aortic stenosis, bicuspid aortic valve status post TAVR, heart murmur, who presents today for hospital follow-up.   She was seen in clinic 02/11/2022 by Dr.Agbor-Etang.  She was.  Echocardiogram she had previously been evaluated by her PCP and was noted to have a cardiac murmur.  Echocardiogram was obtained to evaluate any structural abnormalities.  Patient clinically was asymptomatic denying chest pain shortness of breath or palpitations.  Echocardiogram revealing normal EF 60 to 65%, moderate aortic valve stenosis with mean gradient 25 mmHg recommendation was for repeat echo yearly.   She was seen in clinic on/16/25 stating that overall from a cardiac perspective she had been doing well.  She did have a 30 pound weight loss but states that she ended up with pancreatitis from Ozempic .  She was scheduled for an updated echocardiogram for aortic valve stenosis.  There were no further medication changes that were made.  She was last seen in clinic 03/13/2024 with exertional dyspnea, occasional lightheadedness and dizziness when getting out of bed but denies any syncope or near syncope.  She also noted increased fatigue with activity.  Echocardiogram completed on 5/19 revealed an LVEF of 60 to 65%, no RWMA, mild LVH, G1 DD, right ventricular systolic function was normal and right ventricular size was normal, bicuspid aortic valve noted aortic regurgitation was not visualized but severe aortic valve stenosis with aortic valve area by VTI measuring 0.58 cm and aortic valve mean gradient measuring 48 mmHg.  She was scheduled for a left  heart catheterization for further evaluation of aortic valve stenosis and coronary artery disease with a referral to structural team.  Her left heart catheterization showed a proximal LAD lesion that was 10% stenosis, minor irregularities with no evidence of obstructive coronary artery disease, right heart catheterization showed normal right and left-sided filling pressures, normal pulmonary pressure and normal cardiac output.  She was seen in clinic 07/03/2024 by Dr. Shlomo.  At that time she was referred for TAVR protocol CTA and cardiothoracic surgical opinion.  She was started on aspirin  81 mg daily and continued on ezetimibe  10 mg daily and to continue recommendations of considering statin therapy.  No other medication changes were made at that time.  She was evaluated by Dr. Shyrl on 06/15/2024 for severe aortic stenosis.  After testing was reviewed and symptoms were evaluated due to her bicuspid aortic valve the risks and benefits of bioprosthetic AVR were discussed in detail and patient was agreeable to proceed.  Her procedure was then scheduled.   She was hospitalized at [facility] from 9/18 - 07/07/2024 for severe aortic stenosis and underwent an elective aortic valve replacement.  She had a bioprosthetic valve that was placed.  Postoperative hospital course she did well.  She developed postoperative atrial fibrillation and was subsequently chemically cardioverted with amiodarone .  All routine lines, monitors, and drainage devices were discontinued as anticipated.  She did have expected acute blood loss anemia which stabilized.  She maintained normal renal function.  There was a noted reactive leukocytosis that was monitored clinically.  She was seen by physical therapy to assist with postoperative mobilization due to deconditioning.  She made slow but steady improvement in that regard.  She did develop a phlebitis associated with amiodarone  infusion with subsequent development of a mild  cellulitis.  She was started on Keflex  for the cellulitis.  Her blood pressure was difficult to control and her home medications were uptitrated to higher dosing.  She was also started on hydrochlorothiazide .  She was discharged home on her diabetic regimen.  Overall at the time of discharge she was felt to be quite stable.  She was to be referred to cardiac rehab.  She was to stop atenolol  continue with amiodarone , amlodipine , aspirin , benazepril , Farxiga , ezetimibe , hydrochlorothiazide , metoprolol  tartrate.  Follow-up 6 week post op echocardiogram was ordered.  She returns clinic today accompanied by family member.  She states overall she has been doing well from a cardiac perspective.  She denies any chest pain when she coughs or sneezes.  It is primarily incisional pain.  Her incision is healing and the edges are well-approximated without any drainage or open areas noted.  She does have some numbness and tingling noted in 2 fingers on her left hand that is gradually starting to improve.  She stated prior to discharge from the hospital it was the entire hand and all 4 fingers.  States that she had several medication changes that were made during the hospital and she is finishing up her amiodarone .  She was advised that once she finished her current dosing she would no longer require any further refills of that medication.  Several of her blood pressure medications were increased and changed during her recent hospitalization she has tolerated medications well without undue side effects.  She has been advised that she is still unable to drive or participate in cardiac rehab until she has completed her visit with Dr. Shyrl.  She has a telephone visit scheduled as well as her in person visit and follow-up chest x-ray.  Her 6-week echocardiogram is also already scheduled.  ROS: 10 point review of systems has been reviewed and considered negative with exception was listed in HPI  Studies Reviewed EKG  Interpretation Date/Time:  Thursday July 26 2024 14:22:05 EDT Ventricular Rate:  63 PR Interval:  236 QRS Duration:  102 QT Interval:  416 QTC Calculation: 425 R Axis:   -16  Text Interpretation: Sinus rhythm with 1st degree A-V block Inferior infarct , age undetermined When compared with ECG of 07-Jul-2024 11:26, Artifact No acute changes Confirmed by Gerard Frederick ([id number]) on 07/26/2024 4:50:18 PM    TEE 07/05/2024 POST-OP IMPRESSIONS  _ Left Ventricle: has hyperdynamic systolic function, with an ejection  fraction  of 70%. The cavity size was decreased. The wall motion is normal.  _ Right Ventricle: normal function. The wall motion is normal.  _ Aorta: there is no dissection present in the aorta.  _ Aortic Valve: A bioprosthetic bioprosthetic valve was placed, leaflets  are  freely mobile and leaflets thin. No regurgitation post repair. The  gradient  recorded across the prosthetic valve is within the expected range,  measuring 10  cm/s. No perivalvular leak noted.  _ Mitral Valve: There is mild regurgitation.  _ Tricuspid Valve: There is trivial regurgitation.   PRE-OP FINDINGS   Left Ventricle: The left ventricle has hyperdynamic systolic function,  with an ejection fraction of >65%. The cavity size was decreased. No  evidence of left ventricular regional wall motion abnormalities. There is  severe concentric left ventricular  hypertrophy.   LHC 03/26/2024   Prox LAD lesion is 10% stenosed.  1.  Minor irregularities with no evidence of obstructive coronary artery disease. 2.  Severe aortic stenosis by echo.  I did not attempt to cross the aortic valve. 3.  Right heart catheterization showed normal right and left-sided filling pressures, normal pulmonary pressure and normal cardiac output.   Recommendations: Refer to the structural heart clinic for evaluation of bicuspid aortic valve replacement.  2D echo 03/05/2024 1. Left ventricular ejection fraction, by  estimation, is 60 to 65%. Left  ventricular ejection fraction by PLAX is 63 %. The left ventricle has  normal function. The left ventricle has no regional wall motion  abnormalities. There is mild left ventricular  hypertrophy. Left ventricular diastolic parameters are consistent with  Grade I diastolic dysfunction (impaired relaxation).   2. Right ventricular systolic function is normal. The right ventricular  size is normal.   3. The mitral valve is grossly normal. No evidence of mitral valve  regurgitation.   4. The aortic valve is bicuspid. Aortic valve regurgitation is not  visualized. Severe aortic valve stenosis. Aortic valve area, by VTI  measures 0.58 cm. Aortic valve mean gradient measures 48.0 mmHg. Aortic  valve Vmax measures 4.92 m/s.   5. The inferior vena cava is normal in size with greater than 50%  respiratory variability, suggesting right atrial pressure of 3 mmHg.    2D echo 01/27/2022 1. Left ventricular ejection fraction, by estimation, is 60 to 65%. The  left ventricle has normal function. The left ventricle has no regional  wall motion abnormalities. There is moderate left ventricular hypertrophy.  Left ventricular diastolic  parameters are consistent with Grade I diastolic dysfunction (impaired  relaxation). The average left ventricular global longitudinal strain is  -17.5 %. The global longitudinal strain is normal.   2. Right ventricular systolic function is normal. The right ventricular  size is normal.   3. Left atrial size was mildly dilated.   4. The mitral valve is normal in structure. No evidence of mitral valve  regurgitation. No evidence of mitral stenosis.   5. The aortic valve is normal in structure. There is moderate  calcification of the aortic valve. Aortic valve regurgitation is not  visualized. Moderate aortic valve stenosis. Aortic valve area, by VTI  measures 0.82 cm. Aortic valve mean gradient  measures 25.3 mmHg. Aortic valve Vmax  measures 3.36 m/s.   6. The inferior vena cava is normal in size with greater than 50%  respiratory variability, suggesting right atrial pressure of 3 mmHg.   Risk Assessment/Calculations         Physical Exam VS:  BP (!) 110/52   Pulse 63   Ht 5' 2 (1.575 m)   Wt 196 lb 9.6 oz (89.2 kg)   SpO2 98%   BMI 35.96 kg/m       Wt Readings from Last 3 Encounters:  07/26/24 196 lb 9.6 oz (89.2 kg)  07/11/24 204 lb 9.4 oz (92.8 kg)  07/03/24 205 lb 14.4 oz (93.4 kg)    GEN: Well nourished, well developed in no acute distress NECK: No JVD; No carotid bruits CARDIAC: RRR, II/VI systolic murmur RUSB, without rubs or gallops, chest incision with well-approximated edges scabbed over with no open areas and no drainage noted. RESPIRATORY:  Clear to auscultation without rales, wheezing or rhonchi  ABDOMEN: Soft, non-tender, non-distended EXTREMITIES:  No edema; No deformity   ASSESSMENT AND PLAN Severe aortic stenosis/bicuspid aortic valve/s/p TAVR where she has done well from the surgical perspective.  Her incision is healing well.  She has minimal pain and only has discomfort with coughing and sneezing.  She has a telephone visit scheduled with [facility] and a follow-up appointment and chest x-ray already scheduled.  She has her 6-week echocardiogram already scheduled.  Heart murmur on exam today.  She has been sent for labs today as she did have postoperative anemia and mild electrolyte disturbances during hospitalization that will be reevaluated today with labs.  She has been advised that she is not to drive or participate in cardiac rehab until she is cleared from [facility].  She has been encouraged to walk as much as she can as she does suffer from some mild deconditioning.  Postoperative atrial fibrillation where she converted with amiodarone  infusion postoperatively and is currently on amiodarone  p.o. which she stated during her hospitalization she was started on oral dosing and she was advised when she  finished her current prescription then she was no longer to take any further amiodarone .  EKG today reveals sinus rhythm with first-degree AV block with a rate of 63 with an old inferior infarct with no acute changes today.  She has been sent for labs of CBC, CMP and a TSH after being loaded on amiodarone .  She is also being maintained on metoprolol .  Nonobstructive coronary artery disease with prior proximal LAD lesion in 10% with moderate irregularities without evidence of obstructive coronary artery disease.  She has been continued on aspirin  325 mg daily ezetimibe  to 10 mg daily.  She will need an updated lipid and hepatic panel on return.  Primary hypertension with a blood pressure today 110/52.  Blood pressures remained stable.  She is continued on amlodipine  10 mg daily, benazepril  40 mg daily, HCTZ 12.5 mg daily, and metoprolol  to tartrate 37.5 mg twice daily.  She has been encouraged to continue to monitor her pressure 1 to 2 hours postmedication administration at home as well.  Mixed hyperlipidemia with last LDL of 110 with a goal of ideally less than 55 with history of diabetes.  States that she has upcoming lab work for repeat lipids.  Ongoing management per PCP.  Type 2 diabetes 8.9 where she is continued on insulin  Farxiga , [name] , not ideally controlled which could inhibit healing process.  Ongoing management per PCP.   Cardiac Rehabilitation Eligibility Assessment         Dispo: Patient to return to clinic as MD/APP in 6 weeks or sooner if needed for further evaluation.  Refills on her medication have been sent into the pharmacy of choice per the patient's request today.  Signed, Declin Rajan, NP

## 2024-07-26 NOTE — Patient Instructions (Signed)
 Medication Instructions:  Your physician recommends that you continue on your current medications as directed. Please refer to the Current Medication list given to you today.   *If you need a refill on your cardiac medications before your next appointment, please call your pharmacy*  Lab Work: Your provider would like for you to have following labs drawn today CBC,CMP, TSH.   If you have labs (blood work) drawn today and your tests are completely normal, you will receive your results only by: MyChart Message (if you have MyChart) OR A paper copy in the mail If you have any lab test that is abnormal or we need to change your treatment, we will call you to review the results.  Testing/Procedures: No test ordered today   Follow-Up: At [facility], you and your health needs are our priority.  As part of our continuing mission to provide you with exceptional heart care, our providers are all part of one team.  This team includes your primary Cardiologist (physician) and Advanced Practice Providers or APPs (Physician Assistants and Nurse Practitioners) who all work together to provide you with the care you need, when you need it.  Your next appointment:   6 week(s)  Provider:   You may see Redell Cave, MD or one of the following Advanced Practice Providers on your designated Care Team:   Lonni Meager, NP Lesley Maffucci, PA-C Bernardino Bring, PA-C Cadence Bloomingdale, PA-C Tylene Lunch, NP Barnie Hila, NP

## 2024-07-27 ENCOUNTER — Telehealth: Payer: Self-pay | Admitting: Internal Medicine

## 2024-07-27 ENCOUNTER — Other Ambulatory Visit: Payer: Self-pay | Admitting: Thoracic Surgery (Cardiothoracic Vascular Surgery)

## 2024-07-27 ENCOUNTER — Other Ambulatory Visit: Payer: Self-pay | Admitting: Pharmacist

## 2024-07-27 ENCOUNTER — Ambulatory Visit
Attending: Thoracic Surgery (Cardiothoracic Vascular Surgery) | Admitting: Thoracic Surgery (Cardiothoracic Vascular Surgery)

## 2024-07-27 DIAGNOSIS — Z952 Presence of prosthetic heart valve: Secondary | ICD-10-CM

## 2024-07-27 LAB — CBC
Hematocrit: 36.6 % (ref 34.0–46.6)
Hemoglobin: 11.3 g/dL (ref 11.1–15.9)
MCH: 27.8 pg (ref 26.6–33.0)
MCHC: 30.9 g/dL — ABNORMAL LOW (ref 31.5–35.7)
MCV: 90 fL (ref 79–97)
Platelets: 526 x10E3/uL — ABNORMAL HIGH (ref 150–450)
RBC: 4.07 x10E6/uL (ref 3.77–5.28)
RDW: 13.6 % (ref 11.7–15.4)
WBC: 11.7 x10E3/uL — ABNORMAL HIGH (ref 3.4–10.8)

## 2024-07-27 LAB — BASIC METABOLIC PANEL WITH GFR
BUN/Creatinine Ratio: 23 (ref 12–28)
BUN: 52 mg/dL — ABNORMAL HIGH (ref 8–27)
CO2: 20 mmol/L (ref 20–29)
Calcium: 9.5 mg/dL (ref 8.7–10.3)
Chloride: 99 mmol/L (ref 96–106)
Creatinine, Ser: 2.22 mg/dL — ABNORMAL HIGH (ref 0.57–1.00)
Glucose: 165 mg/dL — ABNORMAL HIGH (ref 70–99)
Potassium: 5.2 mmol/L (ref 3.5–5.2)
Sodium: 138 mmol/L (ref 134–144)
eGFR: 24 mL/min/1.73 — ABNORMAL LOW (ref >59)

## 2024-07-27 LAB — TSH: TSH: 6.1 u[IU]/mL — ABNORMAL HIGH (ref 0.450–4.500)

## 2024-07-27 NOTE — Telephone Encounter (Signed)
 Pt c/o medication issue:  1. Name of Medication:  Lasix  40 MG  2. How are you currently taking this medication (dosage and times per day)?  Once daily   3. Are you having a reaction (difficulty breathing--STAT)?   4. What is your medication issue?   Patient says Dr. Shyrl put her back on Lasix  for 2 weeks. She took her last tablet today and she is no longer having swelling. She mentions Dr. Shyrl thinks the Lasix  may have interfered with her kidneys/labs. Patient would like a call back to discuss.

## 2024-07-27 NOTE — Progress Notes (Addendum)
     [address]       [name] [id number]             [phone]       Patient: Home Provider: Office Consent for Telemedicine visit obtained.  Today's visit was completed via a real-time telehealth (see specific modality noted below). The patient/authorized person provided oral consent at the time of the visit to engage in a telemedicine encounter with the present provider at [facility]. The patient/authorized person was informed of the potential benefits, limitations, and risks of telemedicine. The patient/authorized person expressed understanding that the laws that protect confidentiality also apply to telemedicine. The patient/authorized person acknowledged understanding that telemedicine does not provide emergency services and that he or she would need to call 911 or proceed to the nearest hospital for help if such a need arose.   Total time spent in the clinical discussion 10 minutes.  Telehealth Modality: Phone visit (audio only)  I had a telephone visit with  Shari Lee who is s/p AVR.  Her creatine is elevated on the her most recent labs.  I will recheck them next week and have her follow-up with a CXR in 2 weeks.  I have stopped the lasix , and instructed her to drink more fluids.  Sanjith Siwek [name]

## 2024-07-27 NOTE — Telephone Encounter (Signed)
 Spoke with pt who is asking to set appointment with labs per Dr Lightfoot's request.  Pt advised orders have been placed by his office but she does not need an appointment.  [facility] is on the first floor of [employer] office and she does not need to be fasting.  Pt verbalizes understanding and thanked Charity fundraiser for the call.

## 2024-07-27 NOTE — Progress Notes (Signed)
 07/27/2024 Name: Shari Lee MRN: [id number] DOB: [birth year]  Chief Complaint  Patient presents with   Diabetes   Medication Management    Shari Lee is a 68 y.o. year old female who presented for a telephone visit.   They were referred to the pharmacist by their PCP for assistance in managing diabetes and medication access.    Subjective:   Medication Access/Adherence  Current Pharmacy:  [facility] [address] ([address]), [place] - [address] [place]) [place] [address] Phone: [phone] Fax: [phone]  [facility] [place], [place] - [address] [place] [address] Phone: [phone] Fax: [phone]  [name] [address] [place] [address] Phone: [phone] Fax: [phone]   Patient reports affordability concerns with their medications: Yes  Patient reports access/transportation concerns to their pharmacy: No  Patient reports adherence concerns with their medications:  Yes     Patient was recently hospitalized 09/18/205 to 07/11/2024 for Aortic Valve Replacement:  During hospitalization she developed atrial fibrillation and also had difficult to control hypertension.  During hospital stay her insulin  regimen was Lantus  15 units once a day and insulin  aspart  (novolog )- per sliding scale.  CBG  < 120 (dose in units): 0   CBG 120 - 160 (dose in units): 2   CBG 161 - 200 (dose in units): 4   CBG 201 - 250 (dose in units): 8   CBG 251 - 300 (dose in units): 12   CBG 301 - 350 (dose in units) 16   CBG 351 - 450 (dose in units): 20   CBG  > 450 24 units and obtain STAT glucose and notify MD    She had several medication changes at discharge - patient states she has all her meds filled [facility] before she left the hospital for 30 days.   Medications Stopped at Discharge:  Atenolol  Amlodipine -benazepril  combo  5/20mg  Chlorohexadine mouthwash  Medications Started at Discharge:  Amiodarone  200mg  - take 400mg  twice a day for 5 days, then take 400mg  daily Amlodipine  10mg  daily  Aspirin  325mg  daily  Benazepril  40mg  daily  Keflex  500mg  every 8 hours Hydrochlorothiazide  12.5mg  daily Metoprolol  25mg  take 1.5 tabs = 37.5mg  twice a day Oxycodone  5mg  q6 hours.     Diabetes:  Current medications:  [name]  at 15 units once a day  Novolog  / insulin  aspart 5 units prior to each meal. Add 3 units for high carbohydrate meals. Hold if blood glucose is 100 or less. Approved to get thru Novo Nordisk thru 10/17/2024 Farxiga  10mg  daily - [place] from [place] patient [place] - approved thru 10/17/2024.     Medications tried in the past: Ozempic  - stopped due to pancreatitis; metformin  - worsened IBS related diarrhea   Diet -  Wednesday 10/08 - she had BBQ sandwich and hush puppies - took 5 units of Novolog . Blood glucose spiked to 300's.  Thursday 10/9 - had BBQ, no bun and slaw for lunch - no spike in blood glucose Thursday 10/9 - had Lasagna - no spike in blood glucose but she ate 1 Tbsp of peanut butter before bedtime because she was worried her blood glucose would drop during the night.   Date of Download: 07/14/2024 to 07/27/2024 % Time CGM is active: 96%  Average Glucose: 180mg /dL (previously 821 mg/dL) Glucose Management Indicator: 7.6% (preciously was 7.6%) Glucose Variability: 25.6% (  goal <36%)  Time in Goal:  - Time in range 70-180: 61% (previously 58%) - Time in high range 181 to 250: 31% - Time in very high range > 250: 8% (previously was 8%) - Time below range: 0% ( previously 0%)  Blood glucose trends - Patient had a low blood glucose of  58 overnight once this week. Having fewer highs after meal since started Novolog  but still a few meals that are high in carbohydrates will cause a spike in blood glucose.         Patient mentions that she has not been contacted about  her labs that were drawn at cardiology office yesterday but her daughter reviewed the labs and was concerned about her kidney function.  Reviewed labs and her Scr did increase from usually baseline of around 0.78 to 2.22.  Her TSH also was slightly elevated which can occur with amiodarone  therapy.   Furosemide  was stopped at her appointment with cardiology yesterday 07/26/2024   Component Ref Range & Units (hover) 1 d ago (07/26/24) 2 wk ago (07/08/24) 2 wk ago (07/07/24) 3 wk ago (07/06/24) 3 wk ago (07/06/24) 3 wk ago (07/05/24) 3 wk ago (07/05/24)  Glucose 165 High  152 High  CM 154 High  CM 185 High  CM 113 High  CM 162 High  CM   BUN 52 High  11 R 16 R 18 R 18 R 16 R   Creatinine, Ser 2.22 High  0.78 R 0.82 R 0.94 R 0.86 R 0.82 R   eGFR 24 Low         BUN/Creatinine Ratio 23        Sodium 138 134 Low  R 135 R 135 R 138 R 139 R 144 R  Potassium 5.2 3.7 R 3.8 R 4.1 R 4.4 R 4.2 R 4.2 R  Chloride 99 100 R 100 R 101 R 109 R 107 R   CO2 20 25 R 25 R 25 R 23 R 23 R   Calcium  9.5 8.3 Low  R 8.3 Low  R 8.5 Low  R 8.1 Low  R 8.3 Low  R   Resulting Agency [facility]    Wt Readings from Last 3 Encounters:  07/26/24 196 lb 9.6 oz (89.2 kg)  07/11/24 204 lb 9.4 oz (92.8 kg)  07/03/24 205 lb 14.4 oz (93.4 kg)     Objective:  Lab Results  Component Value Date   HGBA1C 8.9 (H) 07/03/2024   BP Readings from Last 3 Encounters:  07/26/24 (!) 110/52  07/11/24 (!) 153/59  07/03/24 (!) 150/66     Lab Results  Component Value Date   CREATININE 2.22 (H) 07/26/2024   BUN 52 (H) 07/26/2024   NA 138 07/26/2024   K 5.2 07/26/2024   CL 99 07/26/2024   CO2 20 07/26/2024    Lab Results  Component Value Date   CHOL 189 01/24/2024   HDL 55.40 01/24/2024   LDLCALC 110 (H) 01/24/2024   TRIG 119.0 01/24/2024   CHOLHDL 3 01/24/2024    Medications Reviewed Today     Reviewed by Carla Milling, RPH-CPP (Pharmacist) on  07/27/24 at 1103  Med List Status: <None>   Medication Order Taking? Sig Documenting Provider Last Dose Status Informant  albuterol  (VENTOLIN  HFA) 108 (90 Base) MCG/ACT inhaler [id number] Yes Inhale 2 puffs into the lungs every 6 (six) hours as needed for  wheezing or shortness of breath. Emilio Kelly DASEN, FNP  Active Self  amiodarone  (PACERONE ) 200 MG tablet [id number] Yes Take 2 tablets (400mg  total) by mouth 2 (two) times daily For 5 days, then take 2 tablets (400mg ) once daily. Gold, Wayne E, PA-C  Active   amLODipine  (NORVASC ) 10 MG tablet [id number] Yes Take 1 tablet (10 mg total) by mouth daily. Gerard Frederick, NP  Active   aspirin  EC 325 MG tablet [id number] Yes Take 1 tablet (325 mg total) by mouth daily. Viviane Lemond BRAVO, PA-C  Active   benazepril  (LOTENSIN ) 40 MG tablet [id number]  Take 1 tablet (40 mg total) by mouth daily. Gerard Frederick, NP  Active   Blood Glucose Monitoring Suppl (CONTOUR NEXT USB MONITOR) w/Device KIT [id number]  1 kit by Does not apply route daily. To check blood sugar once daily.  Patient not taking: Reported on 07/27/2024   Vivienne Delon CHRISTELLA DEVONNA  Active Self  CareTouch Safety Lancets 26G MISC [id number]  1 Device by Does not apply route daily. To check blood sugar once daily.  Patient not taking: Reported on 07/27/2024   Vivienne Delon CHRISTELLA, PA-C  Active Self  cetirizine  (ZYRTEC ) 10 MG tablet [id number]  Take 1 tablet by mouth once daily Drubel, Manuelita, PA-C  Active Self  Continuous Glucose Sensor (FREESTYLE LIBRE 3 PLUS SENSOR) [place] [id number] Yes USE AS DIRECTED. CHANGE SENSOR EVERY 15 DAYS. Cyndi Manuelita, PA-C  Active Self  dapagliflozin  propanediol (FARXIGA ) 10 MG TABS tablet [id number] Yes Take 1 tablet (10 mg total) by mouth daily. For 2025 [place] Patient [place], [place], PA-C  Active Self  ezetimibe  (ZETIA ) 10 MG tablet [id number] Yes Take 1 tablet (10 mg total) by mouth daily. Cyndi Manuelita, PA-C  Active Self  glucose blood (CONTOUR NEXT TEST)  test strip [id number]  To check blood sugar once daily.  Patient not taking: Reported on 07/27/2024   Burnette, Jennifer M, PA-C  Active Self  hydrochlorothiazide  (HYDRODIURIL ) 12.5 MG tablet [id number] Yes Take 1 tablet (12.5 mg total) by mouth daily. Gerard Frederick, NP  Active   insulin  aspart (NOVOLOG ) 100 UNIT/ML FlexPen [id number] Yes Inject 4 to 10 units up to 3 times a day with a meal. Cyndi, Manuelita, PA-C  Active Self  insulin  degludec ([name]  FLEXTOUCH) 100 UNIT/ML FlexTouch Pen [id number] Yes Inject 15 Units into the skin daily. Carla Milling, RPH-CPP  Active   Insulin  Pen Needle (NOVOFINE PLUS) 32G X 4 MM MISC [id number] Yes To use with ozempic  injections Burnette, Jennifer M, PA-C  Active Self  metoprolol  tartrate (LOPRESSOR ) 25 MG tablet [id number] Yes Take 1.5 tablets (37.5 mg total) by mouth 2 (two) times daily. Gerard Frederick, NP  Active   montelukast  (SINGULAIR ) 10 MG tablet [id number] Yes TAKE 1 TABLET BY MOUTH AT BEDTIME Cyndi Manuelita, PA-C  Active Self  omeprazole (PRILOSEC OTC) 20 MG tablet [id number] Yes Take 20 mg by mouth daily. [provider]  Active Self    Discontinued 07/27/24 1039 (Completed Course)               Assessment/Plan:  Diabetes: blood glucose and GMI over the last 2 weeks has not been at goal but had improved - Continue [name]  at 15 units once a day - will adjust based on home Continuous Glucose Monitor report -  Continue Novolog  / insulin  aspart to 5 units prior to each meal. She will add 3 additional unit if having a meal that is high in carbohydrates - like cereal, pasta, rice, potatoes or  bread. Hold if blood glucose is 100 or less.  - Continue Farxiga  10mg  daily.  - Continue to use Libre 3 sensors to check blood glucose continuously.  Elevated serum Creatinine - could be related to diuretic therapy, low DBP or other medications that could affect renal function but she has been taking for awhile are - benazepril , Farxiga ; Amiodarone   rarely effects renal function but has had post marketing reports.  - Since furosemide  40mg  was stopped yesterday which could be cause of increase in Serum creatinine, I would recommend rechecking BMET next week to see if improving. Will send message to PCP office and cardiology to get their input.  - Encouraged her to have fluids / water  with meals to avoid dehydration.    Elevated TSH - possibly a temporary rise after starting amiodarone  therapy.  - explained possible rise in TSH with amiodarone  to patient. Suspect cardiology will continue to follow labs.   [name], PharmD Clinical Pharmacist [facility]  Population Health [phone]

## 2024-07-30 ENCOUNTER — Ambulatory Visit: Payer: Self-pay | Admitting: Cardiology

## 2024-07-30 DIAGNOSIS — Z79899 Other long term (current) drug therapy: Secondary | ICD-10-CM

## 2024-07-30 NOTE — Progress Notes (Signed)
 Serum creatinine is extremely elevated likely related to some dehydration postoperative after being on furosemide .  Ensure that furosemide  has been discontinued as patient has already been advised to stop furosemide .  She will need to recheck BMP this week.

## 2024-07-31 ENCOUNTER — Other Ambulatory Visit: Payer: Self-pay | Admitting: Pharmacist

## 2024-07-31 NOTE — Progress Notes (Signed)
 07/31/2024 Name: Shari Lee MRN: [id number] DOB: November 18, 1955  Chief Complaint  Patient presents with   Diabetes   Medication Management    Shari Lee is a 68 y.o. year old female who presented for a telephone visit.   They were referred to the pharmacist by their PCP for assistance in managing diabetes and medication access.    Subjective:   Medication Access/Adherence  Current Pharmacy:  [facility] [address] ([address]), [place] - [address] [place]) [place] [address] Phone: [phone] Fax: [phone]  [facility] - [place], [place] - [address] [place] [address] Phone: [phone] Fax: [phone]  [facility] [address] [place] [address] Phone: [phone] Fax: [phone]   Patient reports affordability concerns with their medications: Yes  Patient reports access/transportation concerns to their pharmacy: No  Patient reports adherence concerns with their medications:  Yes     Patient was recently hospitalized 09/18/205 to 07/11/2024 for Aortic Valve Replacement:  During hospitalization she developed atrial fibrillation and also had difficult to control hypertension.  During hospital stay her insulin  regimen was Lantus  15 units once a day and insulin  aspart  (novolog )- per sliding scale.  CBG  < 120 (dose in units): 0   CBG 120 - 160 (dose in units): 2   CBG 161 - 200 (dose in units): 4   CBG 201 - 250 (dose in units): 8   CBG 251 - 300 (dose in units): 12   CBG 301 - 350 (dose in units) 16   CBG 351 - 450 (dose in units): 20   CBG  > 450 24 units and obtain STAT glucose and notify MD    She had several medication changes at discharge - patient states she has all her meds filled [facility] before she left the hospital for 30 days.   Medications Stopped at Discharge:  Atenolol  Amlodipine -benazepril  combo  5/20mg  Chlorohexadine mouthwash  Medications Started at Discharge:  Amiodarone  200mg  - take 400mg  twice a day for 5 days, then take 400mg  daily Amlodipine  10mg  daily  Aspirin  325mg  daily  Benazepril  40mg  daily  Keflex  500mg  every 8 hours Hydrochlorothiazide  12.5mg  daily Metoprolol  25mg  take 1.5 tabs = 37.5mg  twice a day Oxycodone  5mg  q6 hours.     Diabetes:  Current medications:  [name]  at 15 units once a day  Novolog  / insulin  aspart 5 units prior to each meal. Add 3 units for high carbohydrate meals. Hold if blood glucose is 100 or less. Approved to get thru Novo Nordisk thru 10/17/2024 Farxiga  10mg  daily - getting from [facility] and Me patient assistance program - approved thru 10/17/2024.     Medications tried in the past: Ozempic  - stopped due to pancreatitis; metformin  - worsened IBS related diarrhea   Diet -  Wednesday 10/08 - she had BBQ sandwich and hush puppies - took 5 units of Novolog . Blood glucose spiked to 300's.  Thursday 10/9 - had BBQ, no bun and slaw for lunch - no spike in blood glucose Thursday 10/9 - had Lasagna - no spike in blood glucose but she ate 1 Tbsp of peanut butter before bedtime because she was worried her blood glucose would drop during the night.   Date of Download: 07/18/2024 to 07/31/2024 % Time CGM is active: 97%  Average Glucose: 167 mg/dL (previously 819 mg/dL) Glucose Management Indicator: 7.3% (preciously was 7.6%) Glucose Variability:  26.3% (goal <36%)  Time in Goal:  - Time in range 70-180: 69% (previously 61%) - Time in high range 181 to 250: 25% - Time in very high range > 250: 6% (previously was 8%) - Time below range: 0% ( previously 0%)  Blood glucose trends - No recent low blood glucose events in the last 2 weeks. Post prandial highs are improving.       Patient mentions that she was contacted about her labs from last week by cardiologist. She is to remain off furosemide  and increase her water  intake. She will have BMET  rechecked Thursday 08/02/2024   Furosemide  was stopped at her appointment with cardiology 07/26/2024   Component Ref Range & Units (hover) 1 d ago (07/26/24) 2 wk ago (07/08/24) 2 wk ago (07/07/24) 3 wk ago (07/06/24) 3 wk ago (07/06/24) 3 wk ago (07/05/24) 3 wk ago (07/05/24)  Glucose 165 High  152 High  CM 154 High  CM 185 High  CM 113 High  CM 162 High  CM   BUN 52 High  11 R 16 R 18 R 18 R 16 R   Creatinine, Ser 2.22 High  0.78 R 0.82 R 0.94 R 0.86 R 0.82 R   eGFR 24 Low         BUN/Creatinine Ratio 23        Sodium 138 134 Low  R 135 R 135 R 138 R 139 R 144 R  Potassium 5.2 3.7 R 3.8 R 4.1 R 4.4 R 4.2 R 4.2 R  Chloride 99 100 R 100 R 101 R 109 R 107 R   CO2 20 25 R 25 R 25 R 23 R 23 R   Calcium  9.5 8.3 Low  R 8.3 Low  R 8.5 Low  R 8.1 Low  R 8.3 Low  R   Resulting Agency [facility]    Wt Readings from Last 3 Encounters:  07/26/24 196 lb 9.6 oz (89.2 kg)  07/11/24 204 lb 9.4 oz (92.8 kg)  07/03/24 205 lb 14.4 oz (93.4 kg)     Objective:  Lab Results  Component Value Date   HGBA1C 8.9 (H) 07/03/2024   BP Readings from Last 3 Encounters:  07/26/24 (!) 110/52  07/11/24 (!) 153/59  07/03/24 (!) 150/66     Lab Results  Component Value Date   CREATININE 2.22 (H) 07/26/2024   BUN 52 (H) 07/26/2024   NA 138 07/26/2024   K 5.2 07/26/2024   CL 99 07/26/2024   CO2 20 07/26/2024    Lab Results  Component Value Date   CHOL 189 01/24/2024   HDL 55.40 01/24/2024   LDLCALC 110 (H) 01/24/2024   TRIG 119.0 01/24/2024   CHOLHDL 3 01/24/2024    Medications Reviewed Today     Reviewed by Shari Lee, RPH-CPP (Pharmacist) on 07/31/24 at 1211  Med List Status: <None>   Medication Order Taking? Sig Documenting Provider Last Dose Status Informant  albuterol  (VENTOLIN  HFA) 108 (90 Base) MCG/ACT inhaler [id number]  Inhale 2 puffs into the lungs every 6 (six) hours as needed for wheezing or shortness of  breath. Shari Lee DASEN, FNP  Active Self  amiodarone  (PACERONE ) 200 MG tablet [id number]  Take 2 tablets (400mg  total) by mouth 2 (two) times daily For 5 days, then take 2 tablets (400mg ) once daily. Gold, Wayne E, PA-C  Active   amLODipine  (NORVASC ) 10  MG tablet [id number]  Take 1 tablet (10 mg total) by mouth daily. Gerard Frederick, NP  Active   aspirin  EC 325 MG tablet [id number]  Take 1 tablet (325 mg total) by mouth daily. Gold, Wayne E, PA-C  Active   benazepril  (LOTENSIN ) 40 MG tablet [id number]  Take 1 tablet (40 mg total) by mouth daily. Gerard Frederick, NP  Active   Blood Glucose Monitoring Suppl (CONTOUR NEXT USB MONITOR) w/Device KIT [id number]  1 kit by Does not apply route daily. To check blood sugar once daily.  Patient not taking: Reported on 07/27/2024   Vivienne Delon CHRISTELLA DEVONNA  Active Self  CareTouch Safety Lancets 26G MISC [id number]  1 Device by Does not apply route daily. To check blood sugar once daily.  Patient not taking: Reported on 07/27/2024   Vivienne Delon CHRISTELLA, PA-C  Active Self  cetirizine  (ZYRTEC ) 10 MG tablet [id number]  Take 1 tablet by mouth once daily Drubel, Manuelita, PA-C  Active Self  Continuous Glucose Sensor (FREESTYLE LIBRE 3 PLUS SENSOR) MISC [id number]  USE AS DIRECTED. CHANGE SENSOR EVERY 15 DAYS. Cyndi Manuelita, PA-C  Active Self  dapagliflozin  propanediol (FARXIGA ) 10 MG TABS tablet [id number]  Take 1 tablet (10 mg total) by mouth daily. For 2025 [place] Patient Assistance Program [employer], [place], PA-C  Active Self  ezetimibe  (ZETIA ) 10 MG tablet [id number]  Take 1 tablet (10 mg total) by mouth daily. Cyndi Manuelita, PA-C  Active Self  glucose blood (CONTOUR NEXT TEST) test strip [id number]  To check blood sugar once daily.  Patient not taking: Reported on 07/27/2024   Vivienne Delon CHRISTELLA, PA-C  Active Self  hydrochlorothiazide  (HYDRODIURIL ) 12.5 MG tablet [id number]  Take 1 tablet (12.5 mg total) by mouth daily. Gerard Frederick, NP  Active   insulin  aspart  (NOVOLOG ) 100 UNIT/ML FlexPen [id number]  Inject 4 to 10 units up to 3 times a day with a meal. Cyndi, Manuelita, PA-C  Active Self  insulin  degludec ([name]  FLEXTOUCH) 100 UNIT/ML FlexTouch Pen [id number]  Inject 15 Units into the skin daily. Shari Lee, RPH-CPP  Active   Insulin  Pen Needle (NOVOFINE PLUS) 32G X 4 MM MISC [id number]  To use with ozempic  injections Burnette, Jennifer M, PA-C  Active Self  metoprolol  tartrate (LOPRESSOR ) 25 MG tablet [id number]  Take 1.5 tablets (37.5 mg total) by mouth 2 (two) times daily. Gerard Frederick, NP  Active   montelukast  (SINGULAIR ) 10 MG tablet [id number]  TAKE 1 TABLET BY MOUTH AT BEDTIME Cyndi Manuelita, PA-C  Active Self  omeprazole (PRILOSEC OTC) 20 MG tablet [id number]  Take 20 mg by mouth daily. [provider]  Active Self              Assessment/Plan:  Diabetes: blood glucose and GMI over the last 2 weeks has not been at goal but had improved - Continue [name]  at 15 units once a day - will adjust based on home Continuous Glucose Monitor report -  Continue Novolog  / insulin  aspart to 5 units prior to each meal. She will add 3 additional unit if having a meal that is high in carbohydrates - like cereal, pasta, rice, potatoes or bread. Hold if blood glucose is 100 or less.  - Continue Farxiga  10mg  daily.  - Continue to use Libre 3 sensors to check blood glucose continuously.  Elevated serum Creatinine - could be related to diuretic therapy, low DBP or other medications that could affect renal function but she has been taking for awhile are - benazepril , Farxiga ; Amiodarone   rarely effects renal function but has had post marketing reports.  - Remain off furosemide  40mg    - Encouraged fluids / water  with meals to avoid dehydration.  - recheck BMET per cardiology 08/02/2024  Follow up planned for 2 weeks.   [name], PharmD Clinical Pharmacist [facility] Primary Care  Population Health [phone]

## 2024-08-03 LAB — BASIC METABOLIC PANEL WITH GFR
BUN/Creatinine Ratio: 18 (ref 12–28)
BUN: 25 mg/dL (ref 8–27)
CO2: 20 mmol/L (ref 20–29)
Calcium: 9.5 mg/dL (ref 8.7–10.3)
Chloride: 103 mmol/L (ref 96–106)
Creatinine, Ser: 1.38 mg/dL — ABNORMAL HIGH (ref 0.57–1.00)
Glucose: 133 mg/dL — ABNORMAL HIGH (ref 70–99)
Potassium: 4.7 mmol/L (ref 3.5–5.2)
Sodium: 140 mmol/L (ref 134–144)
eGFR: 42 mL/min/1.73 — ABNORMAL LOW (ref >59)

## 2024-08-05 NOTE — Progress Notes (Signed)
 Kidney function and potassium levels are improving. Maintain adequate hydration. Avoid taking NSAID'S, continue to only take Lasix  on an as needed basis. Repeat BMP in 2 weeks.

## 2024-08-06 MED ORDER — FREESTYLE LIBRE 3 PLUS SENSOR MISC: 1 refills | Status: AC

## 2024-08-08 ENCOUNTER — Telehealth: Payer: Self-pay

## 2024-08-08 ENCOUNTER — Other Ambulatory Visit: Payer: Self-pay | Admitting: Cardiology

## 2024-08-08 ENCOUNTER — Other Ambulatory Visit (HOSPITAL_COMMUNITY): Payer: Self-pay

## 2024-08-08 ENCOUNTER — Telehealth: Payer: Self-pay | Admitting: Physician Assistant

## 2024-08-08 NOTE — Telephone Encounter (Signed)
 Pharmacy Patient Advocate Encounter   Received notification from Pt Calls Messages that prior authorization for Freestyle Libre 3 Plus sensor is required/requested.   Insurance verification completed.   The patient is insured through [facility].   Per test claim: PA required; PA submitted to above mentioned insurance via Latent Key/confirmation #/EOC [id number] Status is pending

## 2024-08-08 NOTE — Telephone Encounter (Signed)
 At last office visit with Shari Lunch, NP on 07/26/2024 it was stated that pt was supposed to finish taking amiodarone  and to stop. Pt is requesting a refill on this medication. Does pt supposed to still be taking this medication? Please address

## 2024-08-08 NOTE — Telephone Encounter (Signed)
 Pt notified that a PA has been submitted to ins.

## 2024-08-08 NOTE — Telephone Encounter (Signed)
*  STAT* If patient is at the pharmacy, call can be transferred to refill team.   1. Which medications need to be refilled? (please list name of each medication and dose if known)   amiodarone  (PACERONE ) 200 MG tablet   2. Would you like to learn more about the convenience, safety, & potential cost savings by using the [facility]?   3. Are you open to using the [facility] (Type [facility]. ).  4. Which pharmacy/location (including street and city if local pharmacy) is medication to be sent to?  [facility] - [place] ([address]), [place] - [address]   5. Do they need a 30 day or 90 day supply?   90 day  Patient stated she only has enough medication for 1 day.

## 2024-08-08 NOTE — Telephone Encounter (Signed)
 Copied from CRM [id number]. Topic: General - Other >> Aug 07, 2024  4:58 PM Roselie BROCKS wrote: Reason for CRM: Patient has a refill in to the pharmacy, but the insurance needs prior authorization to fill , Patient states the insurance has faxed the information to the clinic, and needs to know know if clinic has received it and if has been faxed back in yet  Patient requests a call back on this,and needs the refill as soon as possible, its for  (FREESTYLE LIBRE 3 PLUS SENSOR

## 2024-08-09 ENCOUNTER — Other Ambulatory Visit: Payer: Self-pay | Admitting: Thoracic Surgery (Cardiothoracic Vascular Surgery)

## 2024-08-09 ENCOUNTER — Other Ambulatory Visit (HOSPITAL_COMMUNITY): Payer: Self-pay

## 2024-08-09 DIAGNOSIS — Z952 Presence of prosthetic heart valve: Secondary | ICD-10-CM

## 2024-08-09 MED ORDER — AMIODARONE HCL 200 MG PO TABS: 200.0000 mg | ORAL_TABLET | Freq: Every day | ORAL | 0 refills | Status: DC

## 2024-08-09 NOTE — Telephone Encounter (Signed)
 After consulting with Tylene Lunch, NP, called and spoke with the patient to inform her that Tylene has recommended continue taking Amiodarone  200 mg once daily for 3 months. 90-day supply prescription sent to patient's pharmacy ([facility] on [address]).  Patient verbalized understanding and agreement with the treatment plan and expressed gratitude for the phone call.  All questions and concerns addressed at this time.

## 2024-08-09 NOTE — Telephone Encounter (Signed)
 Called and spoke with the patient to inform her that according to provider notes, she was to continue taking Amiodarone  until she finished with her current prescription and then stop taking it.  Patient verbalized understanding and agreement with the treatment plan with all questions and concerns addressed at this time.   ... Postoperative atrial fibrillation where she converted with amiodarone  infusion postoperatively and is currently on amiodarone  p.o. which she stated during her hospitalization she was started on oral dosing and she was advised when she finished her current prescription then she was no longer to take any further amiodarone . SABRASABRASABRA

## 2024-08-09 NOTE — Telephone Encounter (Signed)
 Typically postoperative A-fib but patient stays on amiodarone  for 3 months.  Her discharge from the hospital did not know how long she needed to stay on amiodarone  and there was a lack of documentation or refills on the medication.  She was under the impression that she no longer had to take any further amiodarone  after discharge from the hospital.  Would recommend that she continue with three months of therapy if she is tolerating the medication.

## 2024-08-09 NOTE — Telephone Encounter (Signed)
 Pharmacy Patient Advocate Encounter  Received notification from [employer] that Prior Authorization for Freestyle Libre 3 Plus Kit/Sensor has been APPROVED from 07/19/24 to 10/17/25   PA #/Case ID/Reference #: [id number]  Approval letter indexed to Media tab

## 2024-08-09 NOTE — Telephone Encounter (Signed)
 Amiodarone  200 mg daily would be the dosage

## 2024-08-10 ENCOUNTER — Ambulatory Visit
Admission: RE | Admit: 2024-08-10 | Discharge: 2024-08-10 | Disposition: A | Discharge status: Home / Self Care | Source: Ambulatory Visit | Attending: Internal Medicine | Admitting: Internal Medicine

## 2024-08-10 ENCOUNTER — Ambulatory Visit

## 2024-08-10 VITALS — BP 137/83 | HR 61 | Resp 18 | Ht 62.0 in | Wt 202.0 lb

## 2024-08-10 DIAGNOSIS — Z952 Presence of prosthetic heart valve: Secondary | ICD-10-CM | POA: Insufficient documentation

## 2024-08-10 NOTE — Progress Notes (Signed)
 [address] 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 Shyrl.  The patient's early postoperative recovery while in the hospital was notable for developing postoperative atrial fibrillation.  She was started on amiodarone  and converted back to normal sinus rhythm She developed phlebitis and was treated with Keflex . Her home blood pressure medications were resumed and titrated for hypertension. She was stable for discharge home on 07/11/2024.   Since hospital discharge the patient reports that she has been doing well.  She is not requiring any medications for pain.  She has been trying to walk 15 minutes per day for activity.  She does still notice some shortness of breath with exertion. No shortness of breath at rest. Prior to surgery she was taking montelukast  and albuterol  inhaler for shortness of breath.  She has not restarted these medications after surgery.  She denies chest pain and lower leg swelling.    Allergies as of 08/10/2024       Reactions   Amoxicillin-pot Clavulanate Other (See Comments)   Deathly sick per patient   Levsin  [hyoscyamine]    Dizziness   Ozempic  (0.25 Or 0.5 Mg-dose) [semaglutide (0.25 Or 0.5mg -dos)] Other (See Comments)   pancreatitis   Shellfish Allergy Diarrhea, Nausea And Vomiting   dizziness   Metformin  And Related Diarrhea        Medication List        Accurate as of August 10, 2024 11:40 AM. If you have any questions, ask your nurse or doctor.          albuterol  108 (90 Base) MCG/ACT inhaler Commonly known as: VENTOLIN  HFA Inhale 2 puffs into the lungs every 6 (six) hours as needed for wheezing or  shortness of breath.   amiodarone  200 MG tablet Commonly known as: PACERONE  Take 1 tablet (200 mg total) by mouth daily.   amLODipine  10 MG tablet Commonly known as: NORVASC  Take 1 tablet (10 mg total) by mouth daily.   aspirin  EC 325 MG tablet Take 1 tablet (325 mg total) by mouth daily.   benazepril  40 MG tablet Commonly known as: LOTENSIN  Take 1 tablet (40 mg total) by mouth daily.   CareTouch Safety Lancets 26G Misc 1 Device by Does not apply route daily. To check blood sugar once daily.   cetirizine  10 MG tablet Commonly known as: ZYRTEC  Take 1 tablet by mouth once daily   Contour Next Test test strip Generic drug: glucose blood To check blood sugar once daily.   Contour Next USB Monitor w/Device Kit 1 kit by Does not apply route daily. To check blood sugar once daily.   dapagliflozin  propanediol 10 MG Tabs tablet Commonly known as: Farxiga  Take 1 tablet (10 mg total) by mouth daily. For 2025 [employer] Patient Assistance Program   ezetimibe  10 MG tablet Commonly known as: Zetia  Take 1 tablet (10 mg total) by mouth daily.   FreeStyle Libre 3 Plus Sensor Misc USE AS DIRECTED. CHANGE SENSOR EVERY 15 DAYS.   hydrochlorothiazide  12.5 MG tablet Commonly known as: HYDRODIURIL  Take 1 tablet (12.5 mg total) by mouth daily.   insulin   aspart 100 UNIT/ML FlexPen Commonly known as: NOVOLOG  Inject 4 to 10 units up to 3 times a day with a meal.   Insulin  Pen Needle 32G X 4 MM Misc Commonly known as: NovoFine Plus To use with ozempic  injections   metoprolol  tartrate 25 MG tablet Commonly known as: LOPRESSOR  Take 1.5 tablets (37.5 mg total) by mouth 2 (two) times daily.   montelukast  10 MG tablet Commonly known as: SINGULAIR  TAKE 1 TABLET BY MOUTH AT BEDTIME   omeprazole 20 MG tablet Commonly known as: PRILOSEC OTC Take 20 mg by mouth daily.   [name]  FlexTouch 100 UNIT/ML FlexTouch Pen Generic drug: insulin  degludec Inject 15 Units into the skin daily.          ROS Review of Systems  Constitutional:  Negative for malaise/fatigue.  Respiratory:  Positive for shortness of breath. Negative for cough, hemoptysis and wheezing.        On exertion  Cardiovascular:  Negative for chest pain, palpitations and leg swelling.  Neurological:  Negative for dizziness.      BP 137/83 (BP Location: Right Arm)   Pulse 61   Resp 18   Ht 5' 2 (1.575 m)   Wt 202 lb (91.6 kg)   SpO2 96%   BMI 36.95 kg/m    Physical Exam Constitutional:      Appearance: Normal appearance.  HENT:     Head: Normocephalic and atraumatic.  Cardiovascular:     Rate and Rhythm: Normal rate and regular rhythm.     Heart sounds: S1 normal and S2 normal. Murmur heard.     Comments: Soft systolic murmur consistent with the bioprosthetic aortic valve Pulmonary:     Effort: Pulmonary effort is normal.     Breath sounds: Normal breath sounds.  Musculoskeletal:     Cervical back: Normal range of motion.  Skin:    General: Skin is warm and dry.      Neurological:     General: No focal deficit present.     Mental Status: She is alert.      Imaging: CLINICAL DATA:  Post AVR   EXAM: CHEST - 2 VIEW   COMPARISON:  07/07/2024   FINDINGS: Prior median sternotomy and aortic valve replacement. Heart and mediastinal contours are within normal limits. No focal opacities or effusions. No acute bony abnormality. No pneumothorax. Aortic atherosclerosis. No acute bony abnormality.   IMPRESSION: Post aortic valve replacement.  No pneumothorax.   No acute cardiopulmonary disease.     Electronically Signed   By: Franky Crease M.D.   On: 08/10/2024 11:56  Assessment/Plan: S/P AVR (aortic valve replacement) -We reviewed today's chest x ray.  - We discussed driving and she is able to start since she is no longer requiring narcotics for pain.  First time driving should be a short distance in the daytime and she can slowly increase from there -Discussed participation in  cardiac rehab and she is cleared from a surgical standpoint at this time -Discussed continuation of sternal precautions until a full 6 weeks from surgery.  Continue to use caution with lifting until a full 3 months from surgery -Continue to increase exercise/activity as tolerated -She has been seen by cardiology and she is to continue with amiodarone , metoprolol , hydrochlorothiazide , aspirin , amlodipine , benazepril ,  and zetia . Echocardiogram scheduled for 08/17/2024 -Continue with follow up with PCP for diabetes management -Discussed need for prophylactic antibiotics for all dental cleanings, procedures and minor surgeries. -For shortness of breath on exertion discussed that this could  be due to deconditioning or possibly from her asthma.  She should restart her montelukast  and use albuterol  prior to exercise to see if this helps with her shortness of breath on exertion    Manuelita CHRISTELLA Rough, PA-C 11:40 AM 08/10/24

## 2024-08-10 NOTE — Patient Instructions (Signed)
 You may continue to gradually increase your physical activity as tolerated.  Refrain from any heavy lifting or strenuous use of your arms and shoulders until at least 6 weeks from the time of your surgery, and avoid activities that cause increased pain in your chest on the side of your surgical incision.  Otherwise you may continue to increase activities without any particular limitations.  Increase the intensity and duration of physical activity gradually.   You may return to driving an automobile as long as you are no longer requiring oral narcotic pain relievers during the daytime.  It would be wise to start driving only short distances during the daylight and gradually increase from there as you feel comfortable.   Endocarditis is a potentially serious infection of heart valves or inside lining of the heart.  It occurs more commonly in patients with diseased heart valves (such as patient's with aortic or mitral valve disease) and in patients who have undergone heart valve repair or replacement.  Certain surgical and dental procedures may put you at risk, such as dental cleaning, other dental procedures, or any surgery involving the respiratory, urinary, gastrointestinal tract, gallbladder or prostate gland.   To minimize your chances for develooping endocarditis, maintain good oral health and seek prompt medical attention for any infections involving the mouth, teeth, gums, skin or urinary tract.    Always notify your doctor or dentist about your underlying heart valve condition before having any invasive procedures. You will need to take antibiotics before certain procedures, including all routine dental cleanings or other dental procedures.  Your cardiologist or dentist should prescribe these antibiotics for you to be taken ahead of time.

## 2024-08-13 ENCOUNTER — Telehealth: Payer: Self-pay

## 2024-08-13 NOTE — Telephone Encounter (Signed)
 PAP: Patient assistance application for Farxiga through [employer] ([employer]) has been mailed to pt's home address on file. Provider portion of application will be faxed to provider's office.

## 2024-08-14 ENCOUNTER — Telehealth: Payer: Self-pay

## 2024-08-14 ENCOUNTER — Other Ambulatory Visit: Payer: Self-pay | Admitting: Pharmacist

## 2024-08-14 MED ORDER — EZETIMIBE 10 MG PO TABS: 10.0000 mg | ORAL_TABLET | Freq: Every day | ORAL | 0 refills | Status: DC

## 2024-08-14 NOTE — Telephone Encounter (Signed)
 PAP: Patient assistance application for Novolog  and [name]  through [employer] has been mailed to pt's home address on file. Provider portion of application will be faxed to provider's office. And Patient portion e-filed.

## 2024-08-14 NOTE — Progress Notes (Signed)
 08/14/2024 Name: Shari Lee MRN: [id number] DOB: 07/14/1956  No chief complaint on file.   Shari Lee is a 68 y.o. year old female who presented for a telephone visit.   They were referred to the pharmacist by their PCP for assistance in managing diabetes and medication access.    Subjective:   Medication Access/Adherence  Current Pharmacy:  [facility] [address] ([address]), [place] - [address] [place]) [place] [address] Phone: [phone] Fax: [phone]  [facility] - [place], [place] - [address] [place] [address] Phone: [phone] Fax: [phone]  [name] [address] [place] [address] Phone: [phone] Fax: [phone]   Patient reports affordability concerns with their medications: Yes  Patient reports access/transportation concerns to their pharmacy: No  Patient reports adherence concerns with their medications:  Yes     Patient was hospitalized 09/18/205 to 07/11/2024 for Aortic Valve Replacement:  During hospitalization she developed atrial fibrillation and also had difficult to control hypertension.  During hospital stay her insulin  regimen was Lantus  15 units once a day and insulin  aspart  (novolog )- per sliding scale.  CBG  < 120 (dose in units): 0   CBG 120 - 160 (dose in units): 2   CBG 161 - 200 (dose in units): 4   CBG 201 - 250 (dose in units): 8   CBG 251 - 300 (dose in units): 12   CBG 301 - 350 (dose in units) 16   CBG 351 - 450 (dose in units): 20   CBG  > 450 24 units and obtain STAT glucose and notify MD    She had several medication changes at discharge - patient states she has all her meds filled [facility] before she left the hospital for 30 days.   Medications Stopped at Discharge:  Atenolol  Amlodipine -benazepril  combo 5/20mg  Chlorohexadine mouthwash  Medications Started at  Discharge:  Amiodarone  200mg  - take 400mg  twice a day for 5 days, then take 400mg  daily Amlodipine  10mg  daily  Aspirin  325mg  daily  Benazepril  40mg  daily  Keflex  500mg  every 8 hours Hydrochlorothiazide  12.5mg  daily Metoprolol  25mg  take 1.5 tabs = 37.5mg  twice a day Oxycodone  5mg  q6 hours.     Diabetes:  Current medications:  [name]  at 15 units once a day  Novolog  / insulin  aspart 5 units prior to each meal. Add 3 units for high carbohydrate meals. Hold if blood glucose is 100 or less. Approved to get thru [employer] thru 10/17/2024 Farxiga  10mg  daily - getting from [facility] and Me patient assistance program - approved thru 10/17/2024.    She reports she was visiting her cousin the last week and she forgot to take Novolog  before a few meals - specifically on Sunday October 26th.    Medications tried in the past: Ozempic  - stopped due to pancreatitis; metformin  - worsened IBS related diarrhea   Diet - admits that when she was recently visiting family she had hot dogs and french fries and a milkshake which caused her blood glucose in increase quit a bit.    Date of Download: 08/01/2024 to 08/14/2024 % Time CGM is active: 81%  Average Glucose: 179 mg/dL Glucose Management Indicator: 7.6%  Glucose Variability: 25.1% (goal <36%)  Time in Goal:  - Time in range 70-180: 59%  - Time in high range 181 to 250: 32% - Time in very high range >  250: 9%  - Time below range: 0%   Blood glucose trends - No recent low blood glucose events in the last 4 weeks. Post prandial highs have increased due to dietary indiscretion        Wt Readings from Last 3 Encounters:  08/10/24 202 lb (91.6 kg)  07/26/24 196 lb 9.6 oz (89.2 kg)  07/11/24 204 lb 9.4 oz (92.8 kg)     Objective:  Lab Results  Component Value Date   HGBA1C 8.9 (H) 07/03/2024   BP Readings from Last 3 Encounters:  08/10/24 137/83  07/26/24 (!) 110/52  07/11/24 (!) 153/59     Lab Results  Component Value Date    CREATININE 1.38 (H) 08/02/2024   BUN 25 08/02/2024   NA 140 08/02/2024   K 4.7 08/02/2024   CL 103 08/02/2024   CO2 20 08/02/2024    Lab Results  Component Value Date   CHOL 189 01/24/2024   HDL 55.40 01/24/2024   LDLCALC 110 (H) 01/24/2024   TRIG 119.0 01/24/2024   CHOLHDL 3 01/24/2024    Medications Reviewed Today     Reviewed by Carla Milling, RPH-CPP (Pharmacist) on 08/14/24 at 1149  Med List Status: <None>   Medication Order Taking? Sig Documenting Provider Last Dose Status Informant  albuterol  (VENTOLIN  HFA) 108 (90 Base) MCG/ACT inhaler [id number]  Inhale 2 puffs into the lungs every 6 (six) hours as needed for wheezing or shortness of breath. Emilio Kelly DASEN, FNP  Active Self  amiodarone  (PACERONE ) 200 MG tablet [id number]  Take 1 tablet (200 mg total) by mouth daily. Gerard Frederick, NP  Active   amLODipine  (NORVASC ) 10 MG tablet [id number]  Take 1 tablet (10 mg total) by mouth daily. Gerard Frederick, NP  Active   aspirin  EC 325 MG tablet [id number]  Take 1 tablet (325 mg total) by mouth daily. Gold, Wayne E, PA-C  Active   benazepril  (LOTENSIN ) 40 MG tablet [id number]  Take 1 tablet (40 mg total) by mouth daily. Gerard Frederick, NP  Active   Blood Glucose Monitoring Suppl (CONTOUR NEXT USB MONITOR) w/Device KIT [id number]  1 kit by Does not apply route daily. To check blood sugar once daily. Vivienne Delon HERO, PA-C  Active Self  CareTouch Safety Lancets 26G MISC [id number]  1 Device by Does not apply route daily. To check blood sugar once daily. Vivienne Delon HERO, PA-C  Active Self  cetirizine  (ZYRTEC ) 10 MG tablet [id number]  Take 1 tablet by mouth once daily Drubel, Manuelita, PA-C  Active Self  Continuous Glucose Sensor (FREESTYLE LIBRE 3 PLUS SENSOR) MISC [id number]  USE AS DIRECTED. CHANGE SENSOR EVERY 15 DAYS. Domenica Harlene LABOR, MD  Active   dapagliflozin  propanediol (FARXIGA ) 10 MG TABS tablet [id number]  Take 1 tablet (10 mg total) by mouth daily. For 2025 [employer] Patient  Assistance Program [place]on, [place], [place]  Active Self  ezetimibe  (ZETIA ) 10 MG tablet [id number]  Take 1 tablet (10 mg total) by mouth daily. Cyndi Manuelita, PA-C  Active Self  glucose blood (CONTOUR NEXT TEST) test strip [id number]  To check blood sugar once daily. Vivienne Delon HERO, PA-C  Active Self  hydrochlorothiazide  (HYDRODIURIL ) 12.5 MG tablet [id number]  Take 1 tablet (12.5 mg total) by mouth daily. Gerard Frederick, NP  Active   insulin  aspart (NOVOLOG ) 100 UNIT/ML FlexPen [id number]  Inject 4 to 10 units up to 3 times a day with a meal. Cyndi Manuelita, PA-C  Active Self  insulin  degludec ([name]  FLEXTOUCH) 100 UNIT/ML FlexTouch Pen [id number]  Inject 15 Units into the skin daily. Carla Milling, RPH-CPP  Active   Insulin  Pen Needle (NOVOFINE PLUS) 32G X 4 MM MISC [id number]  To use with ozempic  injections Burnette, Jennifer M, PA-C  Active Self  metoprolol  tartrate (LOPRESSOR ) 25 MG tablet [id number]  Take 1.5 tablets (37.5 mg total) by mouth 2 (two) times daily. Gerard Frederick, NP  Active   montelukast  (SINGULAIR ) 10 MG tablet [id number]  TAKE 1 TABLET BY MOUTH AT BEDTIME Cyndi Shaver, PA-C  Active Self  omeprazole (PRILOSEC OTC) 20 MG tablet [id number]  Take 20 mg by mouth daily. [provider]  Active Self              Assessment/Plan:  Diabetes: blood glucose and GMI over the last 2 weeks has not been at goal but had improved - Increase [name]  to 17 units once a day - will adjust based on home Continuous Glucose Monitor report -  Continue Novolog  / insulin  aspart to 5 units prior to each meal. She will add 3 additional unit if having a meal that is high in carbohydrates - like cereal, pasta, rice, potatoes or bread. Hold if blood glucose is 100 or less.  Discussed importance of taking with each meal to prevent blood glucose excursions.  - Discussed diet and diabetes - recommended limit intake of high fat foods and foods high in sugar like hot dogs and milkshakes.  -  Continue Farxiga  10mg  daily. 2026 application has been mailed to patient by medication assistance team.  - Will coordinate with Med Assistance Team to send out application for [name]  and Novolog  for 2026.  - Continue to use Libre 3 sensors to check blood glucose continuously.   Follow up planned for 2 weeks to see new PCP and 4 weeks with Clinical Pharmacist Practitioner  Milling Carla, PharmD Clinical Pharmacist [facility]  [facility] [phone]

## 2024-08-16 ENCOUNTER — Other Ambulatory Visit: Payer: Self-pay | Admitting: Emergency Medicine

## 2024-08-16 DIAGNOSIS — Z79899 Other long term (current) drug therapy: Secondary | ICD-10-CM

## 2024-08-17 ENCOUNTER — Ambulatory Visit (HOSPITAL_COMMUNITY)
Admission: RE | Admit: 2024-08-17 | Discharge: 2024-08-17 | Disposition: A | Discharge status: Home / Self Care | Attending: Cardiology | Admitting: Cardiology

## 2024-08-17 DIAGNOSIS — Z952 Presence of prosthetic heart valve: Secondary | ICD-10-CM

## 2024-08-17 DIAGNOSIS — I350 Nonrheumatic aortic (valve) stenosis: Secondary | ICD-10-CM | POA: Diagnosis not present

## 2024-08-17 LAB — BASIC METABOLIC PANEL WITH GFR
BUN/Creatinine Ratio: 18 (ref 12–28)
BUN: 22 mg/dL (ref 8–27)
CO2: 19 mmol/L — ABNORMAL LOW (ref 20–29)
Calcium: 9.2 mg/dL (ref 8.7–10.3)
Chloride: 102 mmol/L (ref 96–106)
Creatinine, Ser: 1.21 mg/dL — ABNORMAL HIGH (ref 0.57–1.00)
Glucose: 186 mg/dL — ABNORMAL HIGH (ref 70–99)
Potassium: 5.1 mmol/L (ref 3.5–5.2)
Sodium: 138 mmol/L (ref 134–144)
eGFR: 49 mL/min/1.73 — ABNORMAL LOW (ref >59)

## 2024-08-17 LAB — ECHOCARDIOGRAM COMPLETE
AR max vel: 2.18 cm2
AV Area VTI: 2.05 cm2
AV Area mean vel: 2.12 cm2
AV Mean grad: 13 mmHg
AV Peak grad: 22.7 mmHg
Ao pk vel: 2.38 m/s
Area-P 1/2: 2.39 cm2
S' Lateral: 2.7 cm

## 2024-08-17 NOTE — Progress Notes (Signed)
 Kidney function continues to improve as well as potassium remains stable.  Continue current medication regimen without changes at this time.

## 2024-08-20 ENCOUNTER — Encounter: Attending: Cardiology | Admitting: *Deleted

## 2024-08-20 ENCOUNTER — Other Ambulatory Visit: Payer: Self-pay | Admitting: *Deleted

## 2024-08-20 DIAGNOSIS — Z952 Presence of prosthetic heart valve: Secondary | ICD-10-CM | POA: Insufficient documentation

## 2024-08-20 NOTE — Progress Notes (Signed)
 Initial phone call completed. Diagnosis can be found in [facility] 9/18. EP Orientation scheduled for Thursday 11/6 at 8am.

## 2024-08-21 ENCOUNTER — Ambulatory Visit: Payer: Self-pay | Admitting: Cardiology

## 2024-08-22 ENCOUNTER — Ambulatory Visit: Admitting: Family Medicine

## 2024-08-23 ENCOUNTER — Encounter

## 2024-08-23 VITALS — Ht 62.2 in | Wt 198.5 lb

## 2024-08-23 DIAGNOSIS — Z952 Presence of prosthetic heart valve: Secondary | ICD-10-CM | POA: Diagnosis present

## 2024-08-23 NOTE — Progress Notes (Signed)
 Cardiac Individual Treatment Plan  Patient Details  Name: Shari Lee MRN: [id number] Date of Birth: 13-Aug-1956 Referring Provider:   Flowsheet Row Cardiac Rehab from 08/23/2024 in [facility] Cardiac and Pulmonary Rehab  Referring Provider Darliss Rogue, MD    Initial Encounter Date:  Flowsheet Row Cardiac Rehab from 08/23/2024 in [facility] Cardiac and Pulmonary Rehab  Date 08/23/24    Visit Diagnosis: S/P AVR  Patient's Home Medications on Admission:  Current Outpatient Medications:    albuterol  (VENTOLIN  HFA) 108 (90 Base) MCG/ACT inhaler, Inhale 2 puffs into the lungs every 6 (six) hours as needed for wheezing or shortness of breath., Disp: 8 g, Rfl: 2   amiodarone  (PACERONE ) 200 MG tablet, Take 1 tablet (200 mg total) by mouth daily., Disp: 90 tablet, Rfl: 0   amLODipine  (NORVASC ) 10 MG tablet, Take 1 tablet (10 mg total) by mouth daily., Disp: 90 tablet, Rfl: 3   aspirin  EC 325 MG tablet, Take 1 tablet (325 mg total) by mouth daily., Disp: , Rfl:    benazepril  (LOTENSIN ) 40 MG tablet, Take 1 tablet (40 mg total) by mouth daily., Disp: 90 tablet, Rfl: 3   Blood Glucose Monitoring Suppl (CONTOUR NEXT USB MONITOR) w/Device KIT, 1 kit by Does not apply route daily. To check blood sugar once daily., Disp: 1 kit, Rfl: 0   CareTouch Safety Lancets 26G MISC, 1 Device by Does not apply route daily. To check blood sugar once daily., Disp: 100 each, Rfl: 12   cetirizine  (ZYRTEC ) 10 MG tablet, Take 1 tablet by mouth once daily, Disp: 90 tablet, Rfl: 0   Continuous Glucose Sensor (FREESTYLE LIBRE 3 PLUS SENSOR) MISC, USE AS DIRECTED. CHANGE SENSOR EVERY 15 DAYS., Disp: 6 each, Rfl: 1   dapagliflozin  propanediol (FARXIGA ) 10 MG TABS tablet, Take 1 tablet (10 mg total) by mouth daily. For 2025 [place] Patient [place] Program, Disp: 90 tablet, Rfl: 3   ezetimibe  (ZETIA ) 10 MG tablet, Take 1 tablet (10 mg total) by mouth daily., Disp: 90 tablet, Rfl: 0   glucose blood (CONTOUR NEXT TEST) test strip, To  check blood sugar once daily., Disp: 100 each, Rfl: 5   hydrochlorothiazide  (HYDRODIURIL ) 12.5 MG tablet, Take 1 tablet (12.5 mg total) by mouth daily., Disp: 90 tablet, Rfl: 3   insulin  aspart (NOVOLOG ) 100 UNIT/ML FlexPen, Inject 4 to 10 units up to 3 times a day with a meal., Disp: 15 mL, Rfl: 0   insulin  degludec ([name]  FLEXTOUCH) 100 UNIT/ML FlexTouch Pen, Inject 15 Units into the skin daily., Disp: , Rfl:    Insulin  Pen Needle (NOVOFINE PLUS) 32G X 4 MM MISC, To use with ozempic  injections, Disp: 100 each, Rfl: 3   metoprolol  tartrate (LOPRESSOR ) 25 MG tablet, Take 1.5 tablets (37.5 mg total) by mouth 2 (two) times daily., Disp: 90 tablet, Rfl: 3   montelukast  (SINGULAIR ) 10 MG tablet, TAKE 1 TABLET BY MOUTH AT BEDTIME, Disp: 90 tablet, Rfl: 0   omeprazole (PRILOSEC OTC) 20 MG tablet, Take 20 mg by mouth daily., Disp: , Rfl:   Past Medical History: Past Medical History:  Diagnosis Date   Asthma    mild   Diarrhea    N/V recently   Dyspnea    GERD (gastroesophageal reflux disease)    Headache    Heart murmur    developed in last couple years/ no issues   HOH (hard of hearing)    wears aids   Hypertension    Motion sickness    car   Sleep apnea  Type 2 diabetes mellitus with hyperglycemia (HCC)     Tobacco Use: Social History   Tobacco Use  Smoking Status Never  Smokeless Tobacco Never    Labs: Review Flowsheet  More data exists      Latest Ref Rng & Units 10/20/2023 01/24/2024 03/26/2024 07/03/2024 07/05/2024  Labs for ITP Cardiac and Pulmonary Rehab  Cholestrol 0 - 200 mg/dL 773  810  - - -  LDL (calc) 0 - 99 mg/dL 864  889  - - -  HDL-C >39.00 mg/dL 22.49  44.59  - - -  Trlycerides 0.0 - 149.0 mg/dL 33.9  880.9  - - -  Hemoglobin A1c 4.8 - 5.6 % 9.7  9.0  - 8.9  -  PH, Arterial 7.35 - 7.45 - - 7.341  - 7.271  7.290  7.266  7.185  7.236  7.354  7.391  7.415  7.368  7.396  7.225   PCO2 arterial 32 - 48 mmHg - - 42.5  - 42.0  40.4  46.9  54.8  41.0  34.9  33.8  34.2   41.3  36.5  58.7   Bicarbonate 20.0 - 28.0 mmol/L - - 25.0  23.0  - 19.3  19.4  21.4  20.8  17.4  19.5  20.5  21.9  23.8  23.9  22.4  24.3   TCO2 22 - 32 mmol/L - - 26  24  - 21  21  23  22  19  21  22  24  23  25  24  25  23  21  26  24    Acid-base deficit 0.0 - 2.0 mmol/L - - 2.0  3.0  - 7.0  7.0  5.0  7.0  9.0  6.0  4.0  2.0  1.0  1.0  2.0  4.0   O2 Saturation % - - 73  98  - 91  94  94  93  97  100  100  100  100  79  100  89     Details       Multiple values from one day are sorted in reverse-chronological order          Exercise Target Goals: Exercise Program Goal: Individual exercise prescription set using results from initial 6 min walk test and THRR while considering  patient's activity barriers and safety.   Exercise Prescription Goal: Initial exercise prescription builds to 30-45 minutes a day of aerobic activity, 2-3 days per week.  Home exercise guidelines will be given to patient during program as part of exercise prescription that the participant will acknowledge.   Education: Aerobic Exercise: - Group verbal and visual presentation on the components of exercise prescription. Introduces F.I.T.T principle from ACSM for exercise prescriptions.  Reviews F.I.T.T. principles of aerobic exercise including progression. Written material provided at class time.   Education: Resistance Exercise: - Group verbal and visual presentation on the components of exercise prescription. Introduces F.I.T.T principle from ACSM for exercise prescriptions  Reviews F.I.T.T. principles of resistance exercise including progression. Written material provided at class time.    Education: Exercise & Equipment Safety: - Individual verbal instruction and demonstration of equipment use and safety with use of the equipment. Flowsheet Row Cardiac Rehab from 08/23/2024 in [facility] Cardiac and Pulmonary Rehab  Date 08/23/24  Educator MB  Instruction Review Code 1- Verbalizes Understanding    Education:  Exercise Physiology & General Exercise Guidelines: - Group verbal and written instruction with models to review the exercise  physiology of the cardiovascular system and associated critical values. Provides general exercise guidelines with specific guidelines to those with heart or lung disease. Written material provided at class time. Flowsheet Row Cardiac Rehab from 08/23/2024 in [facility] Cardiac and Pulmonary Rehab  Education need identified 08/23/24    Education: Flexibility, Balance, Mind/Body Relaxation: - Group verbal and visual presentation with interactive activity on the components of exercise prescription. Introduces F.I.T.T principle from ACSM for exercise prescriptions. Reviews F.I.T.T. principles of flexibility and balance exercise training including progression. Also discusses the mind body connection.  Reviews various relaxation techniques to help reduce and manage stress (i.e. Deep breathing, progressive muscle relaxation, and visualization). Balance handout provided to take home. Written material provided at class time.   Activity Barriers & Risk Stratification:  Activity Barriers & Cardiac Risk Stratification - 08/23/24 0954       Activity Barriers & Cardiac Risk Stratification   Activity Barriers Joint Problems;Shortness of Breath;Other (comment)    Comments Bilateral knee and hip pain, L foot plantar fasciitis, 10 lb lifting restriction for 3 months, hearing aid    Cardiac Risk Stratification Moderate          6 Minute Walk:  6 Minute Walk     Row Name 08/23/24 0953         6 Minute Walk   Phase Initial     Distance 1135 feet     Walk Time 6 minutes     # of Rest Breaks 0     MPH 2.15     METS 2.14     RPE 9     Perceived Dyspnea  2     VO2 Peak 7.48     Symptoms No     Resting HR 72 bpm     Resting BP 110/64     Resting Oxygen Saturation  100 %     Exercise Oxygen Saturation  during 6 min walk 98 %     Max Ex. HR 98 bpm     Max Ex. BP 118/60     2 Minute  Post BP 104/60        Oxygen Initial Assessment:   Oxygen Re-Evaluation:   Oxygen Discharge (Final Oxygen Re-Evaluation):   Initial Exercise Prescription:  Initial Exercise Prescription - 08/23/24 0900       Date of Initial Exercise RX and Referring Provider   Date 08/23/24    Referring Provider Darliss Rogue, MD      Oxygen   Maintain Oxygen Saturation 88% or higher      NuStep   Level 2    SPM 80    Minutes 15    METs 2.14      REL-XR   Level 1    Speed 50    Minutes 15    METs 2.14      T5 Nustep   Level 2    SPM 80    Minutes 15    METs 2.14      Track   Laps 30    Minutes 15    METs 2.63      Prescription Details   Frequency (times per week) 2    Duration Progress to 30 minutes of continuous aerobic without signs/symptoms of physical distress      Intensity   THRR 40-80% of Max Heartrate 104-136    Ratings of Perceived Exertion 11-13    Perceived Dyspnea 0-4      Progression   Progression Continue to progress workloads  to maintain intensity without signs/symptoms of physical distress.      Resistance Training   Training Prescription Yes    Weight 4 lb    Reps 10-15          Perform Capillary Blood Glucose checks as needed.  Exercise Prescription Changes:   Exercise Prescription Changes     Row Name 08/23/24 0900             Response to Exercise   Blood Pressure (Admit) 110/64       Blood Pressure (Exercise) 118/60       Blood Pressure (Exit) 104/60       Heart Rate (Admit) 72 bpm       Heart Rate (Exercise) 98 bpm       Heart Rate (Exit) 78 bpm       Oxygen Saturation (Admit) 100 %       Oxygen Saturation (Exercise) 98 %       Oxygen Saturation (Exit) 99 %       Rating of Perceived Exertion (Exercise) 9       Perceived Dyspnea (Exercise) 2       Symptoms none       Comments results         Progression   Average METs 2.14          Exercise Comments:   Exercise Goals and Review:   Exercise Goals      Row Name 08/23/24 0957             Exercise Goals   Increase Physical Activity Yes       Intervention Provide advice, education, support and counseling about physical activity/exercise needs.;Develop an individualized exercise prescription for aerobic and resistive training based on initial evaluation findings, risk stratification, comorbidities and participant's personal goals.       Expected Outcomes Short Term: Attend rehab on a regular basis to increase amount of physical activity.;Long Term: Add in home exercise to make exercise part of routine and to increase amount of physical activity.;Long Term: Exercising regularly at least 3-5 days a week.       Increase Strength and Stamina Yes       Intervention Provide advice, education, support and counseling about physical activity/exercise needs.;Develop an individualized exercise prescription for aerobic and resistive training based on initial evaluation findings, risk stratification, comorbidities and participant's personal goals.       Expected Outcomes Short Term: Increase workloads from initial exercise prescription for resistance, speed, and METs.;Short Term: Perform resistance training exercises routinely during rehab and add in resistance training at home;Long Term: Improve cardiorespiratory fitness, muscular endurance and strength as measured by increased METs and functional capacity ( )       Able to understand and use rate of perceived exertion (RPE) scale Yes       Intervention Provide education and explanation on how to use RPE scale       Expected Outcomes Short Term: Able to use RPE daily in rehab to express subjective intensity level;Long Term:  Able to use RPE to guide intensity level when exercising independently       Able to understand and use Dyspnea scale Yes       Intervention Provide education and explanation on how to use Dyspnea scale       Expected Outcomes Short Term: Able to use Dyspnea scale daily in rehab to  express subjective sense of shortness of breath during exertion;Long Term: Able to use Dyspnea scale to  guide intensity level when exercising independently       Knowledge and understanding of Target Heart Rate Range (THRR) Yes       Intervention Provide education and explanation of THRR including how the numbers were predicted and where they are located for reference       Expected Outcomes Short Term: Able to state/look up THRR;Short Term: Able to use daily as guideline for intensity in rehab;Long Term: Able to use THRR to govern intensity when exercising independently       Able to check pulse independently Yes       Intervention Provide education and demonstration on how to check pulse in carotid and radial arteries.;Review the importance of being able to check your own pulse for safety during independent exercise       Expected Outcomes Short Term: Able to explain why pulse checking is important during independent exercise;Long Term: Able to check pulse independently and accurately       Understanding of Exercise Prescription Yes       Intervention Provide education, explanation, and written materials on patient's individual exercise prescription       Expected Outcomes Short Term: Able to explain program exercise prescription;Long Term: Able to explain home exercise prescription to exercise independently          Exercise Goals Re-Evaluation :   Discharge Exercise Prescription (Final Exercise Prescription Changes):  Exercise Prescription Changes - 08/23/24 0900       Response to Exercise   Blood Pressure (Admit) 110/64    Blood Pressure (Exercise) 118/60    Blood Pressure (Exit) 104/60    Heart Rate (Admit) 72 bpm    Heart Rate (Exercise) 98 bpm    Heart Rate (Exit) 78 bpm    Oxygen Saturation (Admit) 100 %    Oxygen Saturation (Exercise) 98 %    Oxygen Saturation (Exit) 99 %    Rating of Perceived Exertion (Exercise) 9    Perceived Dyspnea (Exercise) 2    Symptoms none     Comments results      Progression   Average METs 2.14          Nutrition:  Target Goals: Understanding of nutrition guidelines, daily intake of sodium 1500mg , cholesterol 200mg , calories 30% from fat and 7% or less from saturated fats, daily to have 5 or more servings of fruits and vegetables.  Education: Nutrition 1 -Group instruction provided by verbal, written material, interactive activities, discussions, models, and posters to present general guidelines for heart healthy nutrition including macronutrients, label reading, and promoting whole foods over processed counterparts. Education serves as pensions consultant of discussion of heart healthy eating for all. Written material provided at class time.    Education: Nutrition 2 -Group instruction provided by verbal, written material, interactive activities, discussions, models, and posters to present general guidelines for heart healthy nutrition including sodium, cholesterol, and saturated fat. Providing guidance of habit forming to improve blood pressure, cholesterol, and body weight. Written material provided at class time.     Biometrics:  Pre Biometrics - 08/23/24 0958       Pre Biometrics   Height 5' 2.2 (1.58 m)    Weight 198 lb 8 oz (90 kg)    Waist Circumference 41.5 inches    Hip Circumference 47 inches    Waist to Hip Ratio 0.88 %    BMI (Calculated) 36.07    Single Leg Stand 14.9 seconds           Nutrition Therapy Plan  and Nutrition Goals:  Nutrition Therapy & Goals - 08/23/24 0958       Nutrition Therapy   RD appointment deferred Yes      Personal Nutrition Goals   Nutrition Goal RD appointment deferred at this time      Intervention Plan   Intervention Prescribe, educate and counsel regarding individualized specific dietary modifications aiming towards targeted core components such as weight, hypertension, lipid management, diabetes, heart failure and other comorbidities.    Expected Outcomes  Short Term Goal: Understand basic principles of dietary content, such as calories, fat, sodium, cholesterol and nutrients.          Nutrition Assessments:  MEDIFICTS Score Key: >=70 Need to make dietary changes  40-70 Heart Healthy Diet <= 40 Therapeutic Level Cholesterol Diet  Flowsheet Row Cardiac Rehab from 08/20/2024 in [facility] Cardiac and Pulmonary Rehab  Picture Your Plate Total Score on Admission 47   Picture Your Plate Scores: <59 Unhealthy dietary pattern with much room for improvement. 41-50 Dietary pattern unlikely to meet recommendations for good health and room for improvement. 51-60 More healthful dietary pattern, with some room for improvement.  >60 Healthy dietary pattern, although there may be some specific behaviors that could be improved.    Nutrition Goals Re-Evaluation:   Nutrition Goals Discharge (Final Nutrition Goals Re-Evaluation):   Psychosocial: Target Goals: Acknowledge presence or absence of significant depression and/or stress, maximize coping skills, provide positive support system. Participant is able to verbalize types and ability to use techniques and skills needed for reducing stress and depression.   Education: Stress, Anxiety, and Depression - Group verbal and visual presentation to define topics covered.  Reviews how body is impacted by stress, anxiety, and depression.  Also discusses healthy ways to reduce stress and to treat/manage anxiety and depression. Written material provided at class time.   Education: Sleep Hygiene -Provides group verbal and written instruction about how sleep can affect your health.  Define sleep hygiene, discuss sleep cycles and impact of sleep habits. Review good sleep hygiene tips.   Initial Review & Psychosocial Screening:  Initial Psych Review & Screening - 08/20/24 1343       Initial Review   Current issues with Current Anxiety/Panic;Current Sleep Concerns      Family Dynamics   Good Support System? Yes       Barriers   Psychosocial barriers to participate in program There are no identifiable barriers or psychosocial needs.;The patient should benefit from training in stress management and relaxation.      Screening Interventions   Interventions Encouraged to exercise;Provide feedback about the scores to participant;To provide support and resources with identified psychosocial needs    Expected Outcomes Short Term goal: Utilizing psychosocial counselor, staff and physician to assist with identification of specific Stressors or current issues interfering with healing process. Setting desired goal for each stressor or current issue identified.;Long Term Goal: Stressors or current issues are controlled or eliminated.;Short Term goal: Identification and review with participant of any Quality of Life or Depression concerns found by scoring the questionnaire.;Long Term goal: The participant improves quality of Life and PHQ9 Scores as seen by post scores and/or verbalization of changes          Quality of Life Scores:   Quality of Life - 08/20/24 1351       Quality of Life   Select Quality of Life      Quality of Life Scores   Health/Function Pre 17.6 %    Socioeconomic Pre 23.63 %  Psych/Spiritual Pre 21 %    Family Pre 5.4 %    GLOBAL Pre 17.91 %         Scores of 19 and below usually indicate a poorer quality of life in these areas.  A difference of  2-3 points is a clinically meaningful difference.  A difference of 2-3 points in the total score of the Quality of Life Index has been associated with significant improvement in overall quality of life, self-image, physical symptoms, and general health in studies assessing change in quality of life.  PHQ-9: Review Flowsheet  More data exists      08/23/2024 03/21/2023 12/17/2022 11/29/2022 11/19/2022  Depression screen PHQ 2/9  Decreased Interest 1 0 0 0 0  Down, Depressed, Hopeless 0 0 0 0 0  PHQ - 2 Score 1 0 0 0 0  Altered sleeping 1 0 3  3 3   Tired, decreased energy 3 0 0 1 0  Change in appetite 0 0 0 1 0  Feeling bad or failure about yourself  1 0 0 0 0  Trouble concentrating 1 0 0 0 0  Moving slowly or fidgety/restless 0 0 0 0 0  Suicidal thoughts 0 0 0 0 0  PHQ-9 Score 7 0 3 5 3   Difficult doing work/chores Somewhat difficult Not difficult at all Not difficult at all Somewhat difficult Not difficult at all   Interpretation of Total Score  Total Score Depression Severity:  1-4 = Minimal depression, 5-9 = Mild depression, 10-14 = Moderate depression, 15-19 = Moderately severe depression, 20-27 = Severe depression   Psychosocial Evaluation and Intervention:  Psychosocial Evaluation - 08/20/24 1412       Psychosocial Evaluation & Interventions   Interventions Stress management education;Relaxation education;Encouraged to exercise with the program and follow exercise prescription    Comments Onesha is coming to cardiac rehab after a valve replacement. She states she still feels short of breath and finds herself more anxious than before. Things like [place] the interstate and feeling unable to twist to merge make her anxious which was not the case prior to surgery. She also notes her blood sugar has been more elevated than prior to her surgery and thinks it is the stress. She is ready to come to the program to learn more about heart healthy lifestyle    Expected Outcomes Short: attend cardiac rehab for education and exercise Long: develop and maintain positive self care habits    Continue Psychosocial Services  Follow up required by staff          Psychosocial Re-Evaluation:   Psychosocial Discharge (Final Psychosocial Re-Evaluation):   Vocational Rehabilitation: Provide vocational rehab assistance to qualifying candidates.   Vocational Rehab Evaluation & Intervention:  Vocational Rehab - 08/20/24 1343       Initial Vocational Rehab Evaluation & Intervention   Assessment shows need for Vocational  Rehabilitation No          Education: Education Goals: Education classes will be provided on a variety of topics geared toward better understanding of heart health and risk factor modification. Participant will state understanding/return demonstration of topics presented as noted by education test scores.  Learning Barriers/Preferences:  Learning Barriers/Preferences - 08/20/24 1337       Learning Barriers/Preferences   Learning Barriers None    Learning Preferences None          General Cardiac Education Topics:  AED/CPR: - Group verbal and written instruction with the use of models to demonstrate the basic  use of the AED with the basic ABC's of resuscitation.   Test and Procedures: - Group verbal and visual presentation and models provide information about basic cardiac anatomy and function. Reviews the testing methods done to diagnose heart disease and the outcomes of the test results. Describes the treatment choices: Medical Management, Angioplasty, or Coronary Bypass Surgery for treating various heart conditions including Myocardial Infarction, Angina, Valve Disease, and Cardiac Arrhythmias. Written material provided at class time. Flowsheet Row Cardiac Rehab from 08/23/2024 in [facility] Cardiac and Pulmonary Rehab  Education need identified 08/23/24    Medication Safety: - Group verbal and visual instruction to review commonly prescribed medications for heart and lung disease. Reviews the medication, class of the drug, and side effects. Includes the steps to properly store meds and maintain the prescription regimen. Written material provided at class time.   Intimacy: - Group verbal instruction through game format to discuss how heart and lung disease can affect sexual intimacy. Written material provided at class time.   Know Your Numbers and Heart Failure: - Group verbal and visual instruction to discuss disease risk factors for cardiac and pulmonary disease and treatment  options.  Reviews associated critical values for Overweight/Obesity, Hypertension, Cholesterol, and Diabetes.  Discusses basics of heart failure: signs/symptoms and treatments.  Introduces Heart Failure Zone chart for action plan for heart failure. Written material provided at class time. Flowsheet Row Cardiac Rehab from 08/23/2024 in [facility] Cardiac and Pulmonary Rehab  Education need identified 08/23/24    Infection Prevention: - Provides verbal and written material to individual with discussion of infection control including proper hand washing and proper equipment cleaning during exercise session. Flowsheet Row Cardiac Rehab from 08/23/2024 in [facility] Cardiac and Pulmonary Rehab  Date 08/23/24  Educator MB  Instruction Review Code 1- Verbalizes Understanding    Falls Prevention: - Provides verbal and written material to individual with discussion of falls prevention and safety. Flowsheet Row Cardiac Rehab from 08/23/2024 in [facility] Cardiac and Pulmonary Rehab  Date 08/23/24  Educator MB  Instruction Review Code 1- Verbalizes Understanding    Other: -Provides group and verbal instruction on various topics (see comments)   Knowledge Questionnaire Score:  Knowledge Questionnaire Score - 08/20/24 1351       Knowledge Questionnaire Score   Pre Score 22/26          Core Components/Risk Factors/Patient Goals at Admission:  Personal Goals and Risk Factors at Admission - 08/23/24 0959       Core Components/Risk Factors/Patient Goals on Admission    Weight Management Yes;Weight Maintenance   Does not like to set specific weight goal, focuses on healthy lifestyle   Intervention Weight Management: Develop a combined nutrition and exercise program designed to reach desired caloric intake, while maintaining appropriate intake of nutrient and fiber, sodium and fats, and appropriate energy expenditure required for the weight goal.;Weight Management: Provide education and appropriate resources to  help participant work on and attain dietary goals.;Weight Management/Obesity: Establish reasonable short term and long term weight goals.    Expected Outcomes Short Term: Continue to assess and modify interventions until short term weight is achieved;Long Term: Adherence to nutrition and physical activity/exercise program aimed toward attainment of established weight goal;Understanding recommendations for meals to include 15-35% energy as protein, 25-35% energy from fat, 35-60% energy from carbohydrates, less than 200mg  of dietary cholesterol, 20-35 gm of total fiber daily;Understanding of distribution of calorie intake throughout the day with the consumption of 4-5 meals/snacks;Weight Maintenance: Understanding of the daily nutrition guidelines,  which includes 25-35% calories from fat, 7% or less cal from saturated fats, less than 200mg  cholesterol, less than 1.5gm of sodium, & 5 or more servings of fruits and vegetables daily    Diabetes Yes    Intervention Provide education about signs/symptoms and action to take for hypo/hyperglycemia.;Provide education about proper nutrition, including hydration, and aerobic/resistive exercise prescription along with prescribed medications to achieve blood glucose in normal ranges: Fasting glucose 65-99 mg/dL    Expected Outcomes Short Term: Participant verbalizes understanding of the signs/symptoms and immediate care of hyper/hypoglycemia, proper foot care and importance of medication, aerobic/resistive exercise and nutrition plan for blood glucose control.;Long Term: Attainment of HbA1C < 7%.    Hypertension Yes    Intervention Provide education on lifestyle modifcations including regular physical activity/exercise, weight management, moderate sodium restriction and increased consumption of fresh fruit, vegetables, and low fat dairy, alcohol moderation, and smoking cessation.;Monitor prescription use compliance.    Expected Outcomes Short Term: Continued assessment and  intervention until BP is < 140/8mm HG in hypertensive participants. < 130/72mm HG in hypertensive participants with diabetes, heart failure or chronic kidney disease.;Long Term: Maintenance of blood pressure at goal levels.    Lipids Yes    Intervention Provide education and support for participant on nutrition & aerobic/resistive exercise along with prescribed medications to achieve LDL 70mg , HDL >40mg .    Expected Outcomes Short Term: Participant states understanding of desired cholesterol values and is compliant with medications prescribed. Participant is following exercise prescription and nutrition guidelines.;Long Term: Cholesterol controlled with medications as prescribed, with individualized exercise RX and with personalized nutrition plan. Value goals: LDL < 70mg , HDL > 40 mg.          Education:Diabetes - Individual verbal and written instruction to review signs/symptoms of diabetes, desired ranges of glucose level fasting, after meals and with exercise. Acknowledge that pre and post exercise glucose checks will be done for 3 sessions at entry of program. Flowsheet Row Cardiac Rehab from 08/23/2024 in [facility] Cardiac and Pulmonary Rehab  Date 08/23/24  Educator MB  Instruction Review Code 1- Verbalizes Understanding    Core Components/Risk Factors/Patient Goals Review:    Core Components/Risk Factors/Patient Goals at Discharge (Final Review):    ITP Comments:  ITP Comments     Row Name 08/20/24 1410 08/23/24 0953         ITP Comments Initial phone call completed. Diagnosis can be found in [facility] 9/18. EP Orientation scheduled for Thursday 11/6 at 8am. Completed and gym orientation for cardiac rehab. Initial ITP created and sent for review to Dr. Oneil Pinal, Medical Director.         Comments: Initial ITP

## 2024-08-23 NOTE — Patient Instructions (Signed)
 Patient Instructions  Patient Details  Name: Shari Lee MRN: [id number] Date of Birth: 04/26/1956 Referring Provider:  Shyrl Linnie KIDD, MD  Below are your personal goals for exercise, nutrition, and risk factors. Our goal is to help you stay on track towards obtaining and maintaining these goals. We will be discussing your progress on these goals with you throughout the program.  Initial Exercise Prescription:  Initial Exercise Prescription - 08/23/24 0900       Date of Initial Exercise RX and Referring Provider   Date 08/23/24    Referring Provider Darliss Rogue, MD      Oxygen   Maintain Oxygen Saturation 88% or higher      NuStep   Level 2    SPM 80    Minutes 15    METs 2.14      REL-XR   Level 1    Speed 50    Minutes 15    METs 2.14      T5 Nustep   Level 2    SPM 80    Minutes 15    METs 2.14      Track   Laps 30    Minutes 15    METs 2.63      Prescription Details   Frequency (times per week) 2    Duration Progress to 30 minutes of continuous aerobic without signs/symptoms of physical distress      Intensity   THRR 40-80% of Max Heartrate 104-136    Ratings of Perceived Exertion 11-13    Perceived Dyspnea 0-4      Progression   Progression Continue to progress workloads to maintain intensity without signs/symptoms of physical distress.      Resistance Training   Training Prescription Yes    Weight 4 lb    Reps 10-15          Exercise Goals: Frequency: Be able to perform aerobic exercise two to three times per week in program working toward 2-5 days per week of home exercise.  Intensity: Work with a perceived exertion of 11 (fairly light) - 15 (hard) while following your exercise prescription.  We will make changes to your prescription with you as you progress through the program.   Duration: Be able to do 30 to 45 minutes of continuous aerobic exercise in addition to a 5 minute warm-up and a 5 minute cool-down routine.    Nutrition Goals: Your personal nutrition goals will be established when you do your nutrition analysis with the dietician.  The following are general nutrition guidelines to follow: Cholesterol < 200mg /day Sodium < 1500mg /day Fiber: Women over 50 yrs - 21 grams per day  Personal Goals:  Personal Goals and Risk Factors at Admission - 08/23/24 0959       Core Components/Risk Factors/Patient Goals on Admission    Weight Management Yes;Weight Maintenance   Does not like to set specific weight goal, focuses on healthy lifestyle   Intervention Weight Management: Develop a combined nutrition and exercise program designed to reach desired caloric intake, while maintaining appropriate intake of nutrient and fiber, sodium and fats, and appropriate energy expenditure required for the weight goal.;Weight Management: Provide education and appropriate resources to help participant work on and attain dietary goals.;Weight Management/Obesity: Establish reasonable short term and long term weight goals.    Expected Outcomes Short Term: Continue to assess and modify interventions until short term weight is achieved;Long Term: Adherence to nutrition and physical activity/exercise program aimed toward attainment of established weight goal;Understanding  recommendations for meals to include 15-35% energy as protein, 25-35% energy from fat, 35-60% energy from carbohydrates, less than 200mg  of dietary cholesterol, 20-35 gm of total fiber daily;Understanding of distribution of calorie intake throughout the day with the consumption of 4-5 meals/snacks;Weight Maintenance: Understanding of the daily nutrition guidelines, which includes 25-35% calories from fat, 7% or less cal from saturated fats, less than 200mg  cholesterol, less than 1.5gm of sodium, & 5 or more servings of fruits and vegetables daily    Diabetes Yes    Intervention Provide education about signs/symptoms and action to take for hypo/hyperglycemia.;Provide  education about proper nutrition, including hydration, and aerobic/resistive exercise prescription along with prescribed medications to achieve blood glucose in normal ranges: Fasting glucose 65-99 mg/dL    Expected Outcomes Short Term: Participant verbalizes understanding of the signs/symptoms and immediate care of hyper/hypoglycemia, proper foot care and importance of medication, aerobic/resistive exercise and nutrition plan for blood glucose control.;Long Term: Attainment of HbA1C < 7%.    Hypertension Yes    Intervention Provide education on lifestyle modifcations including regular physical activity/exercise, weight management, moderate sodium restriction and increased consumption of fresh fruit, vegetables, and low fat dairy, alcohol moderation, and smoking cessation.;Monitor prescription use compliance.    Expected Outcomes Short Term: Continued assessment and intervention until BP is < 140/63mm HG in hypertensive participants. < 130/37mm HG in hypertensive participants with diabetes, heart failure or chronic kidney disease.;Long Term: Maintenance of blood pressure at goal levels.    Lipids Yes    Intervention Provide education and support for participant on nutrition & aerobic/resistive exercise along with prescribed medications to achieve LDL 70mg , HDL >40mg .    Expected Outcomes Short Term: Participant states understanding of desired cholesterol values and is compliant with medications prescribed. Participant is following exercise prescription and nutrition guidelines.;Long Term: Cholesterol controlled with medications as prescribed, with individualized exercise RX and with personalized nutrition plan. Value goals: LDL < 70mg , HDL > 40 mg.          Tobacco Use Initial Evaluation: Social History   Tobacco Use  Smoking Status Never  Smokeless Tobacco Never    Exercise Goals and Review:  Exercise Goals     Row Name 08/23/24 0957             Exercise Goals   Increase Physical  Activity Yes       Intervention Provide advice, education, support and counseling about physical activity/exercise needs.;Develop an individualized exercise prescription for aerobic and resistive training based on initial evaluation findings, risk stratification, comorbidities and participant's personal goals.       Expected Outcomes Short Term: Attend rehab on a regular basis to increase amount of physical activity.;Long Term: Add in home exercise to make exercise part of routine and to increase amount of physical activity.;Long Term: Exercising regularly at least 3-5 days a week.       Increase Strength and Stamina Yes       Intervention Provide advice, education, support and counseling about physical activity/exercise needs.;Develop an individualized exercise prescription for aerobic and resistive training based on initial evaluation findings, risk stratification, comorbidities and participant's personal goals.       Expected Outcomes Short Term: Increase workloads from initial exercise prescription for resistance, speed, and METs.;Short Term: Perform resistance training exercises routinely during rehab and add in resistance training at home;Long Term: Improve cardiorespiratory fitness, muscular endurance and strength as measured by increased METs and functional capacity ( )       Able to understand and use  rate of perceived exertion (RPE) scale Yes       Intervention Provide education and explanation on how to use RPE scale       Expected Outcomes Short Term: Able to use RPE daily in rehab to express subjective intensity level;Long Term:  Able to use RPE to guide intensity level when exercising independently       Able to understand and use Dyspnea scale Yes       Intervention Provide education and explanation on how to use Dyspnea scale       Expected Outcomes Short Term: Able to use Dyspnea scale daily in rehab to express subjective sense of shortness of breath during exertion;Long Term: Able to  use Dyspnea scale to guide intensity level when exercising independently       Knowledge and understanding of Target Heart Rate Range (THRR) Yes       Intervention Provide education and explanation of THRR including how the numbers were predicted and where they are located for reference       Expected Outcomes Short Term: Able to state/look up THRR;Short Term: Able to use daily as guideline for intensity in rehab;Long Term: Able to use THRR to govern intensity when exercising independently       Able to check pulse independently Yes       Intervention Provide education and demonstration on how to check pulse in carotid and radial arteries.;Review the importance of being able to check your own pulse for safety during independent exercise       Expected Outcomes Short Term: Able to explain why pulse checking is important during independent exercise;Long Term: Able to check pulse independently and accurately       Understanding of Exercise Prescription Yes       Intervention Provide education, explanation, and written materials on patient's individual exercise prescription       Expected Outcomes Short Term: Able to explain program exercise prescription;Long Term: Able to explain home exercise prescription to exercise independently

## 2024-08-24 LAB — OPHTHALMOLOGY REPORT-SCANNED

## 2024-08-28 ENCOUNTER — Encounter

## 2024-08-28 DIAGNOSIS — Z952 Presence of prosthetic heart valve: Secondary | ICD-10-CM | POA: Diagnosis not present

## 2024-08-28 LAB — GLUCOSE, CAPILLARY
Glucose-Capillary: 176 mg/dL — ABNORMAL HIGH (ref 70–99)
Glucose-Capillary: 111 mg/dL — ABNORMAL HIGH (ref 70–99)

## 2024-08-28 NOTE — Progress Notes (Signed)
 Daily Session Note  Patient Details  Name: Shari Lee MRN: [id number] Date of Birth: 07-Sep-1956 Referring Provider:   Flowsheet Row Cardiac Rehab from 08/23/2024 in [facility] Cardiac and Pulmonary Rehab  Referring Provider Darliss Rogue, MD    Encounter Date: 08/28/2024  Check In:  Session Check In - 08/28/24 1108       Check-In   Supervising physician immediately available to respond to emergencies See telemetry face sheet for immediately available ER MD    Location [facility]-Cardiac & Pulmonary Rehab    Staff Present Selinda Pereyra RDN,LDN;Noah Tickle, BS, Exercise Physiologist;Maxon Burnell HECKLE, Exercise Physiologist;Hades Mathew RN,BSN,MPA;Meredith Tressa RN,BSN;Mary Godley, RN, DNP, NE-BC    Virtual Visit No    Medication changes reported     No    Fall or balance concerns reported    No    Warm-up and Cool-down Performed on first and last piece of equipment    Resistance Training Performed Yes    VAD Patient? No    PAD/SET Patient? No      Pain Assessment   Currently in Pain? No/denies             Social History   Tobacco Use  Smoking Status Never  Smokeless Tobacco Never    Goals Met:  Independence with exercise equipment Exercise tolerated well No report of concerns or symptoms today Strength training completed today  Goals Unmet:  Not Applicable  Comments: First full day of exercise!  Patient was oriented to gym and equipment including functions, settings, policies, and procedures.  Patient's individual exercise prescription and treatment plan were reviewed.  All starting workloads were established based on the results of the 6 minute walk test done at initial orientation visit.  The plan for exercise progression was also introduced and progression will be customized based on patient's performance and goals.    Dr. Oneil Pinal is Medical Director for [facility] Cardiac Rehabilitation.  Dr. Fuad Aleskerov is Medical Director for [facility] Pulmonary  Rehabilitation.

## 2024-08-29 ENCOUNTER — Encounter: Payer: Self-pay | Admitting: Family Medicine

## 2024-08-29 ENCOUNTER — Ambulatory Visit (INDEPENDENT_AMBULATORY_CARE_PROVIDER_SITE_OTHER): Admitting: Family Medicine

## 2024-08-29 VITALS — BP 106/53 | HR 68 | Ht 62.0 in | Wt 202.0 lb

## 2024-08-29 DIAGNOSIS — Z952 Presence of prosthetic heart valve: Secondary | ICD-10-CM

## 2024-08-29 DIAGNOSIS — K219 Gastro-esophageal reflux disease without esophagitis: Secondary | ICD-10-CM

## 2024-08-29 DIAGNOSIS — I152 Hypertension secondary to endocrine disorders: Secondary | ICD-10-CM

## 2024-08-29 DIAGNOSIS — I10 Essential (primary) hypertension: Secondary | ICD-10-CM

## 2024-08-29 DIAGNOSIS — E1165 Type 2 diabetes mellitus with hyperglycemia: Secondary | ICD-10-CM | POA: Diagnosis not present

## 2024-08-29 DIAGNOSIS — E1169 Type 2 diabetes mellitus with other specified complication: Secondary | ICD-10-CM

## 2024-08-29 DIAGNOSIS — Z0000 Encounter for general adult medical examination without abnormal findings: Secondary | ICD-10-CM

## 2024-08-29 DIAGNOSIS — R7989 Other specified abnormal findings of blood chemistry: Secondary | ICD-10-CM | POA: Diagnosis not present

## 2024-08-29 DIAGNOSIS — R0602 Shortness of breath: Secondary | ICD-10-CM

## 2024-08-29 DIAGNOSIS — R946 Abnormal results of thyroid function studies: Secondary | ICD-10-CM

## 2024-08-29 DIAGNOSIS — Z794 Long term (current) use of insulin: Secondary | ICD-10-CM

## 2024-08-29 DIAGNOSIS — E785 Hyperlipidemia, unspecified: Secondary | ICD-10-CM

## 2024-08-29 DIAGNOSIS — E1159 Type 2 diabetes mellitus with other circulatory complications: Secondary | ICD-10-CM

## 2024-08-29 DIAGNOSIS — R911 Solitary pulmonary nodule: Secondary | ICD-10-CM

## 2024-08-29 MED ORDER — ALBUTEROL SULFATE HFA 108 (90 BASE) MCG/ACT IN AERS: 2.0000 | INHALATION_SPRAY | Freq: Four times a day (QID) | RESPIRATORY_TRACT | 2 refills | Status: AC | PRN

## 2024-08-29 MED ORDER — OMEPRAZOLE MAGNESIUM 20 MG PO TBEC: 20.0000 mg | DELAYED_RELEASE_TABLET | Freq: Every day | ORAL | 1 refills | Status: DC

## 2024-08-29 MED ORDER — BUDESONIDE-FORMOTEROL FUMARATE 80-4.5 MCG/ACT IN AERO: 2.0000 | INHALATION_SPRAY | Freq: Two times a day (BID) | RESPIRATORY_TRACT | 3 refills | Status: DC

## 2024-08-29 NOTE — Assessment & Plan Note (Signed)
 Medication management: Zetia  (statins caused memory issues) Lifestyle factors for lowering cholesterol include: Diet therapy - heart-healthy diet rich in fruits, veggies, fiber-rich whole grains, lean meats, chicken, fish (at least twice a week), fat-free or 1% dairy products; foods low in saturated/trans fats, cholesterol, sodium, and sugar. Mediterranean diet has shown to be very heart healthy. Regular exercise - recommend at least 30 minutes a day, 5 times per week Weight management

## 2024-08-29 NOTE — Assessment & Plan Note (Signed)
 Doing well overall. Some ongoing dyspnea which she though would have improved post-surgery. Continue with cardiac rehab and regular cardiology follow-up.

## 2024-08-29 NOTE — Assessment & Plan Note (Signed)
Blood pressure is at goal for age and co-morbidities.   Recommendations: continue current regimen - BP goal <130/80 - monitor and log blood pressures at home - check around the same time each day in a relaxed setting - Limit salt to <2000 mg/day - Follow DASH eating plan (heart healthy diet) - limit alcohol to 2 standard drinks per day for men and 1 per day for women - avoid tobacco products - get at least 2 hours of regular aerobic exercise weekly Patient aware of signs/symptoms requiring further/urgent evaluation.

## 2024-08-29 NOTE — Progress Notes (Signed)
 New Patient Office Visit   Subjective     Patient ID: Shari Lee, female   DOB: [birth year]  Age: 68 y.o. MRN: [id number]   CC:  Chief Complaint  Patient presents with   Establish Care      HPI Shari Lee presents to establish care. Currently living alone. She is retired from working at a bank.    Discussed the use of AI scribe software for clinical note transcription with the patient, who gave verbal consent to proceed.  History of Present Illness Shari Lee is a 68 year old female with diabetes and recent aortic valve replacement who presents for follow-up of her diabetes management.  She is experiencing challenges with her diabetes management. Despite being on Farxiga  10 mg, [name]  17 units daily, and Novolog  10 units with meals, her blood sugar levels remain elevated, with fasting levels around 140 mg/dL and pre-breakfast levels reaching 180-190 mg/dL. Her last A1c in September was 8.9%. She uses a sensor to monitor her blood sugar and is in contact with our pharmacist for ongoing management.  She underwent an aortic valve replacement seven weeks ago and has been experiencing persistent dyspnea. She uses an albuterol  inhaler as needed but reports limited relief. She has a history of atrial fibrillation following her surgery and is currently taking amiodarone . She is participating in cardiac rehabilitation and monitors her blood pressure, which has been stable recently.  A 5 mm non-calcified nodule was identified in the left lower lobe of her lung during a heart catheterization in July. She has a family history of lung cancer and second-hand smoke exposure from her mother, who smoked. She experiences dyspnea, especially with activity, but denies wheezing. Her last chest x-ray in October showed no significant findings.     Hypertension; Aortic Valve Stenosis s/p AVR, Atrial Fibrillation: - Medications: Amiodarone  200 mg daily, Amlodipine  10 mg daily, benazepril  40 mg  daily, HCTZ 12.5 mg daily, and Metoprolol  tartrate 37.5 mg daily. - Followed by Cardiology and Cardiothoracic, last office visit on 08/10/2024.  - Compliance: good - Checking BP at home: occasionally, well controlled  - Denies any SOB, recurrent headaches, CP, vision changes, LE edema, dizziness, palpitations, or medication side effects.   Diabetes: - Checking glucose at home: CGM, 150s in the morning - Medications: Farxiga  10 mg daily, Novolog  10 units three times daily with meals, and [name]  17 units daily.  - Compliance: good - Eye exam: 08/24/2024, no retinopathy - Foot exam: 10/20/2023 - Microalbumin:  - Denies symptoms of hypoglycemia, polyuria, polydipsia, numbness extremities, foot ulcers/trauma, wounds that are not healing, medication side effects  Lab Results  Component Value Date   HGBA1C 8.9 (H) 07/03/2024      Outpatient Medications Prior to Visit  Medication Sig   amiodarone  (PACERONE ) 200 MG tablet Take 1 tablet (200 mg total) by mouth daily.   amLODipine  (NORVASC ) 10 MG tablet Take 1 tablet (10 mg total) by mouth daily.   aspirin  EC 325 MG tablet Take 1 tablet (325 mg total) by mouth daily.   benazepril  (LOTENSIN ) 40 MG tablet Take 1 tablet (40 mg total) by mouth daily.   Blood Glucose Monitoring Suppl (CONTOUR NEXT USB MONITOR) w/Device KIT 1 kit by Does not apply route daily. To check blood sugar once daily.   CareTouch Safety Lancets 26G MISC 1 Device by Does not apply route daily. To check blood sugar once daily.   cetirizine  (ZYRTEC ) 10 MG tablet Take 1 tablet by mouth once daily  Continuous Glucose Sensor (FREESTYLE LIBRE 3 PLUS SENSOR) MISC USE AS DIRECTED. CHANGE SENSOR EVERY 15 DAYS.   dapagliflozin  propanediol (FARXIGA ) 10 MG TABS tablet Take 1 tablet (10 mg total) by mouth daily. For 2025 [place] Patient Assistance Program   ezetimibe  (ZETIA ) 10 MG tablet Take 1 tablet (10 mg total) by mouth daily.   glucose blood (CONTOUR NEXT TEST) test strip To check  blood sugar once daily.   hydrochlorothiazide  (HYDRODIURIL ) 12.5 MG tablet Take 1 tablet (12.5 mg total) by mouth daily.   insulin  aspart (NOVOLOG ) 100 UNIT/ML FlexPen Inject 4 to 10 units up to 3 times a day with a meal.   insulin  degludec ([name]  FLEXTOUCH) 100 UNIT/ML FlexTouch Pen Inject 15 Units into the skin daily.   Insulin  Pen Needle (NOVOFINE PLUS) 32G X 4 MM MISC To use with ozempic  injections   metoprolol  tartrate (LOPRESSOR ) 25 MG tablet Take 1.5 tablets (37.5 mg total) by mouth 2 (two) times daily.   montelukast  (SINGULAIR ) 10 MG tablet TAKE 1 TABLET BY MOUTH AT BEDTIME   [DISCONTINUED] albuterol  (VENTOLIN  HFA) 108 (90 Base) MCG/ACT inhaler Inhale 2 puffs into the lungs every 6 (six) hours as needed for wheezing or shortness of breath.   [DISCONTINUED] omeprazole (PRILOSEC OTC) 20 MG tablet Take 20 mg by mouth daily.   No facility-administered medications prior to visit.   Past Medical History:  Diagnosis Date   Asthma    mild   Diarrhea    N/V recently   Dyspnea    GERD (gastroesophageal reflux disease)    Headache    Heart murmur    developed in last couple years/ no issues   HOH (hard of hearing)    wears aids   Hypertension    Motion sickness    car   Sleep apnea    Type 2 diabetes mellitus with hyperglycemia (HCC)     Past Surgical History:  Procedure Laterality Date   ABDOMINAL HYSTERECTOMY  2006   AORTIC VALVE REPLACEMENT N/A 07/05/2024   Procedure: REPLACEMENT, AORTIC VALVE, OPEN USING INSPIRIS VALVE;  Surgeon: Shyrl Linnie KIDD, MD;  Location: [facility] OR;  Service: Open Heart Surgery;  Laterality: N/A;   BREAST BIOPSY Left 2005   benign   BREAST EXCISIONAL BIOPSY Left 2005?    -benign   CATARACT EXTRACTION W/PHACO Right 11/30/2022   Procedure: CATARACT EXTRACTION PHACO AND INTRAOCULAR LENS PLACEMENT (IOC) RIGHT DIABETIC;  Surgeon: Jaye Fallow, MD;  Location: [facility];  Service: Ophthalmology;  Laterality: Right;  4.96 0:37.3    CATARACT EXTRACTION W/PHACO Left 12/14/2022   Procedure: CATARACT EXTRACTION PHACO AND INTRAOCULAR LENS PLACEMENT (IOC) LEFT DIABETIC;  Surgeon: Jaye Fallow, MD;  Location: [facility];  Service: Ophthalmology;  Laterality: Left;  5.98 0:40.6   COLONOSCOPY WITH PROPOFOL  N/A 08/13/2016   Procedure: COLONOSCOPY WITH PROPOFOL ;  Surgeon: Rogelia Copping, MD;  Location: [facility];  Service: Endoscopy;  Laterality: N/A;   COLONOSCOPY WITH PROPOFOL  N/A 02/09/2022   Procedure: COLONOSCOPY WITH PROPOFOL ;  Surgeon: Copping Rogelia, MD;  Location: [facility];  Service: Endoscopy;  Laterality: N/A;   POLYPECTOMY  08/13/2016   Procedure: POLYPECTOMY;  Surgeon: Rogelia Copping, MD;  Location: [facility];  Service: Endoscopy;;   RIGHT HEART CATH AND CORONARY ANGIOGRAPHY N/A 03/26/2024   Procedure: RIGHT HEART CATH AND CORONARY ANGIOGRAPHY;  Surgeon: Darron Deatrice LABOR, MD;  Location: [facility] IN[facility];  Service: Cardiovascular;  Laterality: N/A;   TEE WITHOUT CARDIOVERSION N/A 07/05/2024   Procedure: ECHOCARDIOGRAM, TRANSESOPHAGEAL;  Surgeon: Shyrl Linnie KIDD, MD;  Location: [facility] OR;  Service: Open Heart Surgery;  Laterality: N/A;     Family History  Problem Relation Age of Onset   Heart attack Mother    Diabetes Mother    Heart disease Mother    Heart failure Father    Breast cancer Sister    Sleep apnea Brother    Diverticulitis Brother    Diverticulitis Sister     Social History   Socioeconomic History   Marital status: Divorced    Spouse name: Not on file   Number of children: Not on file   Years of education: Not on file   Highest education level: 9th grade  Occupational History   Not on file  Tobacco Use   Smoking status: Never   Smokeless tobacco: Never  Vaping Use   Vaping status: Never Used  Substance and Sexual Activity   Alcohol use: No    Alcohol/week: 0.0 standard drinks of alcohol   Drug use: No   Sexual activity: Not Currently  Other Topics  Concern   Not on file  Social History Narrative   Not on file   Social Drivers of Health   Financial Resource Strain: Medium Risk (08/28/2024)   Overall Financial Resource Strain (CARDIA)    Difficulty of Paying Living Expenses: Somewhat hard  Food Insecurity: Food Insecurity Present (08/28/2024)   Hunger Vital Sign    Worried About Running Out of Food in the Last Year: Sometimes true    Ran Out of Food in the Last Year: Never true  Transportation Needs: No Transportation Needs (08/28/2024)   PRAPARE - Administrator, Civil Service (Medical): No    Lack of Transportation (Non-Medical): No  Physical Activity: Inactive (08/28/2024)   Exercise Vital Sign    Days of Exercise per Week: 0 days    Minutes of Exercise per Session: Not on file  Stress: No Stress Concern Present (08/28/2024)   [employer] of Occupational Health - Occupational Stress Questionnaire    Feeling of Stress: Not at all  Social Connections: Moderately Integrated (08/28/2024)   Social Connection and Isolation Panel    Frequency of Communication with Friends and Family: More than three times a week    Frequency of Social Gatherings with Friends and Family: Twice a week    Attends Religious Services: More than 4 times per year    Active Member of [employer] or Organizations: Yes    Attends Engineer, Structural: More than 4 times per year    Marital Status: Divorced       ROS All review of systems negative except what is listed in the HPI    Objective     BP (!) 106/53   Pulse 68   Ht 5' 2 (1.575 m)   Wt 202 lb (91.6 kg)   SpO2 97%   BMI 36.95 kg/m   Physical Exam Vitals reviewed.  Constitutional:      Appearance: Normal appearance. She is obese.  Cardiovascular:     Rate and Rhythm: Normal rate and regular rhythm.     Heart sounds: Murmur heard.  Pulmonary:     Effort: Pulmonary effort is normal.     Breath sounds: Normal breath sounds.  Musculoskeletal:     Right lower  leg: No edema.     Left lower leg: No edema.  Skin:    General: Skin is warm and dry.  Neurological:     Mental Status: She is  alert and oriented to person, place, and time.  Psychiatric:        Mood and Affect: Mood normal.        Behavior: Behavior normal.        Thought Content: Thought content normal.        Judgment: Judgment normal.        Assessment & Plan:     Problem List Items Addressed This Visit       Active Problems   Type 2 diabetes mellitus with hyperglycemia (HCC)   Poorly controlled diabetes with recent A1c of 8.9%. Current regimen includes Farxiga , [name] , and Novolog . Fasting blood sugars remain elevated, with readings around 180-190 mg/dL in the morning. - Increased [name]  to 19 units daily. - Continue Farxiga  and Novolog  as prescribed. - Provided dietary handout for diabetes management. - Will recheck A1c in 3 months.      Hypertension associated with diabetes (HCC)   Blood pressure is at goal for age and co-morbidities.   Recommendations: continue current regimen - BP goal <130/80 - monitor and log blood pressures at home - check around the same time each day in a relaxed setting - Limit salt to <2000 mg/day - Follow DASH eating plan (heart healthy diet) - limit alcohol to 2 standard drinks per day for men and 1 per day for women - avoid tobacco products - get at least 2 hours of regular aerobic exercise weekly Patient aware of signs/symptoms requiring further/urgent evaluation.        Hyperlipidemia associated with type 2 diabetes mellitus (HCC)   Medication management: Zetia  (statins caused memory issues) Lifestyle factors for lowering cholesterol include: Diet therapy - heart-healthy diet rich in fruits, veggies, fiber-rich whole grains, lean meats, chicken, fish (at least twice a week), fat-free or 1% dairy products; foods low in saturated/trans fats, cholesterol, sodium, and sugar. Mediterranean diet has shown to be very heart  healthy. Regular exercise - recommend at least 30 minutes a day, 5 times per week Weight management         S/P AVR (aortic valve replacement)   Doing well overall. Some ongoing dyspnea which she though would have improved post-surgery. Continue with cardiac rehab and regular cardiology follow-up.       Other Visit Diagnoses       Encounter for medical examination to establish care        Essential (primary) hypertension         Elevated TSH       Relevant Orders   TSH   T4, free     Gastroesophageal reflux disease, unspecified whether esophagitis present       Relevant Medications   omeprazole (PRILOSEC OTC) 20 MG tablet     Shortness of breath     Persistent shortness of breath and chronic cough, possibly related to recent heart surgery. Albuterol  inhaler used but not providing significant relief. No wheezing reported. Recent chest x-ray showed no significant findings. Symptoms only present with exertion.  - Continue cardiac rehab.  - Prescribed Symbicort inhaler for maintenance use. - Continue albuterol  as needed for acute symptoms. - Will consider referral to pulmonologist if symptoms persist.   Relevant Medications   albuterol  (VENTOLIN  HFA) 108 (90 Base) MCG/ACT inhaler   budesonide-formoterol (SYMBICORT) 80-4.5 MCG/ACT inhaler        Lung nodule - No follow-up require per radiology report, but given family history of lung cancer and second hand smoke exposure since childhood, she would like to scan again next summer  to ensure stable.      Patient aware of signs/symptoms requiring further/urgent evaluation.         Return in about 3 months (around 11/29/2024) for chronic disease management.  Waddell KATHEE Mon, NP  I,Emily Lagle,acting as a scribe for Waddell KATHEE Mon, NP.,have documented all relevant documentation on the behalf of Waddell KATHEE Mon, NP.  I, Waddell KATHEE Mon, NP, have reviewed all documentation for this visit. The documentation on 08/29/2024 for the exam,  diagnosis, procedures, and orders are all accurate and complete.

## 2024-08-29 NOTE — Assessment & Plan Note (Signed)
 Poorly controlled diabetes with recent A1c of 8.9%. Current regimen includes Farxiga , [name] , and Novolog . Fasting blood sugars remain elevated, with readings around 180-190 mg/dL in the morning. - Increased [name]  to 19 units daily. - Continue Farxiga  and Novolog  as prescribed. - Provided dietary handout for diabetes management. - Will recheck A1c in 3 months.

## 2024-08-30 ENCOUNTER — Encounter: Admitting: Emergency Medicine

## 2024-08-30 ENCOUNTER — Ambulatory Visit: Payer: Self-pay | Admitting: Family Medicine

## 2024-08-30 DIAGNOSIS — R0602 Shortness of breath: Secondary | ICD-10-CM

## 2024-08-30 DIAGNOSIS — Z952 Presence of prosthetic heart valve: Secondary | ICD-10-CM | POA: Diagnosis not present

## 2024-08-30 LAB — GLUCOSE, CAPILLARY
Glucose-Capillary: 210 mg/dL — ABNORMAL HIGH (ref 70–99)
Glucose-Capillary: 119 mg/dL — ABNORMAL HIGH (ref 70–99)

## 2024-08-30 LAB — T4, FREE: Free T4: 0.72 ng/dL (ref 0.60–1.60)

## 2024-08-30 LAB — TSH: TSH: 5.55 u[IU]/mL — ABNORMAL HIGH (ref 0.35–5.50)

## 2024-08-30 NOTE — Progress Notes (Signed)
 Daily Session Note  Patient Details  Name: Marveen Donlon MRN: [id number] Date of Birth: [birth year] Referring Provider:   Flowsheet Row Cardiac Rehab from 08/23/2024 in [facility] Cardiac and Pulmonary Rehab  Referring Provider Darliss Rogue, MD    Encounter Date: 08/30/2024  Check In:  Session Check In - 08/30/24 1105       Check-In   Supervising physician immediately available to respond to emergencies See telemetry face sheet for immediately available ER MD    Location [facility]-Cardiac & Pulmonary Rehab    Staff Present Leita Franks RN,BSN;[facility] RCP,RRT,BSRT;Noah Tickle, [place], Exercise Physiologist;Jason Elnor RDN,LDN    Virtual Visit No    Medication changes reported     No    Fall or balance concerns reported    No    Warm-up and Cool-down Performed on first and last piece of equipment    Resistance Training Performed Yes    VAD Patient? No    PAD/SET Patient? No      Pain Assessment   Currently in Pain? No/denies             Social History   Tobacco Use  Smoking Status Never  Smokeless Tobacco Never    Goals Met:  Independence with exercise equipment Exercise tolerated well No report of concerns or symptoms today Strength training completed today  Goals Unmet:  Not Applicable  Comments: Pt able to follow exercise prescription today without complaint.  Will continue to monitor for progression.    Dr. Oneil Pinal is Medical Director for [facility] Cardiac Rehabilitation.  Dr. Fuad Aleskerov is Medical Director for [facility] Pulmonary Rehabilitation.

## 2024-09-03 MED ORDER — FLUTICASONE-SALMETEROL 100-50 MCG/ACT IN AEPB: 1.0000 | INHALATION_SPRAY | Freq: Two times a day (BID) | RESPIRATORY_TRACT | 3 refills | Status: AC

## 2024-09-04 ENCOUNTER — Encounter: Admitting: *Deleted

## 2024-09-04 DIAGNOSIS — Z952 Presence of prosthetic heart valve: Secondary | ICD-10-CM

## 2024-09-04 LAB — GLUCOSE, CAPILLARY
Glucose-Capillary: 163 mg/dL — ABNORMAL HIGH (ref 70–99)
Glucose-Capillary: 172 mg/dL — ABNORMAL HIGH (ref 70–99)

## 2024-09-04 NOTE — Progress Notes (Signed)
 Daily Session Note  Patient Details  Name: Takeshia Wenk MRN: [id number] Date of Birth: 1956/07/22 Referring Provider:   Flowsheet Row Cardiac Rehab from 08/23/2024 in [facility] Cardiac and Pulmonary Rehab  Referring Provider Darliss Rogue, MD    Encounter Date: 09/04/2024  Check In:  Session Check In - 09/04/24 1200       Check-In   Supervising physician immediately available to respond to emergencies See telemetry face sheet for immediately available ER MD    Location [facility]-Cardiac & Pulmonary Rehab    Staff Present Selinda Pereyra RDN,LDN;Maxon Conetta BS, Exercise Physiologist;Kelly Metro RN,BSN,MPA;Meredith Tressa RN,BSN    Virtual Visit No    Medication changes reported     No    Fall or balance concerns reported    No    Warm-up and Cool-down Performed on first and last piece of equipment    Resistance Training Performed Yes    VAD Patient? No    PAD/SET Patient? No      Pain Assessment   Currently in Pain? No/denies             Social History   Tobacco Use  Smoking Status Never  Smokeless Tobacco Never    Goals Met:  Independence with exercise equipment Exercise tolerated well No report of concerns or symptoms today Strength training completed today  Goals Unmet:  Not Applicable  Comments: Pt able to follow exercise prescription today without complaint.  Will continue to monitor for progression.    Dr. Oneil Pinal is Medical Director for [facility] Cardiac Rehabilitation.  Dr. Fuad Aleskerov is Medical Director for [facility] Pulmonary Rehabilitation.

## 2024-09-05 DIAGNOSIS — Z952 Presence of prosthetic heart valve: Secondary | ICD-10-CM

## 2024-09-05 NOTE — Progress Notes (Signed)
 Cardiac Individual Treatment Plan  Patient Details  Name: Shari Lee MRN: [id number] Date of Birth: 1956/09/23 Referring Provider:   Flowsheet Row Cardiac Rehab from 08/23/2024 in [facility] Cardiac and Pulmonary Rehab  Referring Provider Darliss Rogue, MD    Initial Encounter Date:  Flowsheet Row Cardiac Rehab from 08/23/2024 in [facility] Cardiac and Pulmonary Rehab  Date 08/23/24    Visit Diagnosis: S/P AVR  Patient's Home Medications on Admission:  Current Outpatient Medications:    albuterol  (VENTOLIN  HFA) 108 (90 Base) MCG/ACT inhaler, Inhale 2 puffs into the lungs every 6 (six) hours as needed for wheezing or shortness of breath., Disp: 8 g, Rfl: 2   amiodarone  (PACERONE ) 200 MG tablet, Take 1 tablet (200 mg total) by mouth daily., Disp: 90 tablet, Rfl: 0   amLODipine  (NORVASC ) 10 MG tablet, Take 1 tablet (10 mg total) by mouth daily., Disp: 90 tablet, Rfl: 3   aspirin  EC 325 MG tablet, Take 1 tablet (325 mg total) by mouth daily., Disp: , Rfl:    benazepril  (LOTENSIN ) 40 MG tablet, Take 1 tablet (40 mg total) by mouth daily., Disp: 90 tablet, Rfl: 3   Blood Glucose Monitoring Suppl (CONTOUR NEXT USB MONITOR) w/Device KIT, 1 kit by Does not apply route daily. To check blood sugar once daily., Disp: 1 kit, Rfl: 0   CareTouch Safety Lancets 26G MISC, 1 Device by Does not apply route daily. To check blood sugar once daily., Disp: 100 each, Rfl: 12   cetirizine  (ZYRTEC ) 10 MG tablet, Take 1 tablet by mouth once daily, Disp: 90 tablet, Rfl: 0   Continuous Glucose Sensor (FREESTYLE LIBRE 3 PLUS SENSOR) MISC, USE AS DIRECTED. CHANGE SENSOR EVERY 15 DAYS., Disp: 6 each, Rfl: 1   dapagliflozin  propanediol (FARXIGA ) 10 MG TABS tablet, Take 1 tablet (10 mg total) by mouth daily. For 2025 [place] Patient [place], Disp: 90 tablet, Rfl: 3   ezetimibe  (ZETIA ) 10 MG tablet, Take 1 tablet (10 mg total) by mouth daily., Disp: 90 tablet, Rfl: 0   fluticasone -salmeterol (WIXELA INHUB) 100-50  MCG/ACT AEPB, Inhale 1 puff into the lungs 2 (two) times daily., Disp: 1 each, Rfl: 3   glucose blood (CONTOUR NEXT TEST) test strip, To check blood sugar once daily., Disp: 100 each, Rfl: 5   hydrochlorothiazide  (HYDRODIURIL ) 12.5 MG tablet, Take 1 tablet (12.5 mg total) by mouth daily., Disp: 90 tablet, Rfl: 3   insulin  aspart (NOVOLOG ) 100 UNIT/ML FlexPen, Inject 4 to 10 units up to 3 times a day with a meal., Disp: 15 mL, Rfl: 0   insulin  degludec ([name]  FLEXTOUCH) 100 UNIT/ML FlexTouch Pen, Inject 15 Units into the skin daily., Disp: , Rfl:    Insulin  Pen Needle (NOVOFINE PLUS) 32G X 4 MM MISC, To use with ozempic  injections, Disp: 100 each, Rfl: 3   metoprolol  tartrate (LOPRESSOR ) 25 MG tablet, Take 1.5 tablets (37.5 mg total) by mouth 2 (two) times daily., Disp: 90 tablet, Rfl: 3   montelukast  (SINGULAIR ) 10 MG tablet, TAKE 1 TABLET BY MOUTH AT BEDTIME, Disp: 90 tablet, Rfl: 0   omeprazole  (PRILOSEC  OTC) 20 MG tablet, Take 1 tablet (20 mg total) by mouth daily., Disp: 90 tablet, Rfl: 1  Past Medical History: Past Medical History:  Diagnosis Date   Asthma    mild   Diarrhea    N/V recently   Dyspnea    GERD (gastroesophageal reflux disease)    Headache    Heart murmur    developed in last couple years/ no issues  HOH (hard of hearing)    wears aids   Hypertension    Motion sickness    car   Sleep apnea    Type 2 diabetes mellitus with hyperglycemia (HCC)     Tobacco Use: Social History   Tobacco Use  Smoking Status Never  Smokeless Tobacco Never    Labs: Review Flowsheet  More data exists      Latest Ref Rng & Units 10/20/2023 01/24/2024 03/26/2024 07/03/2024 07/05/2024  Labs for ITP Cardiac and Pulmonary Rehab  Cholestrol 0 - 200 mg/dL 773  810  - - -  LDL (calc) 0 - 99 mg/dL 864  889  - - -  HDL-C >39.00 mg/dL 22.49  44.59  - - -  Trlycerides 0.0 - 149.0 mg/dL 33.9  880.9  - - -  Hemoglobin A1c 4.8 - 5.6 % 9.7  9.0  - 8.9  -  PH, Arterial 7.35 - 7.45 - - 7.341  -  7.271  7.290  7.266  7.185  7.236  7.354  7.391  7.415  7.368  7.396  7.225   PCO2 arterial 32 - 48 mmHg - - 42.5  - 42.0  40.4  46.9  54.8  41.0  34.9  33.8  34.2  41.3  36.5  58.7   Bicarbonate 20.0 - 28.0 mmol/L - - 25.0  23.0  - 19.3  19.4  21.4  20.8  17.4  19.5  20.5  21.9  23.8  23.9  22.4  24.3   TCO2 22 - 32 mmol/L - - 26  24  - 21  21  23  22  19  21  22  24  23  25  24  25  23  21  26  24    Acid-base deficit 0.0 - 2.0 mmol/L - - 2.0  3.0  - 7.0  7.0  5.0  7.0  9.0  6.0  4.0  2.0  1.0  1.0  2.0  4.0   O2 Saturation % - - 73  98  - 91  94  94  93  97  100  100  100  100  79  100  89     Details       Multiple values from one day are sorted in reverse-chronological order          Exercise Target Goals: Exercise Program Goal: Individual exercise prescription set using results from initial 6 min walk test and THRR while considering  patient's activity barriers and safety.   Exercise Prescription Goal: Initial exercise prescription builds to 30-45 minutes a day of aerobic activity, 2-3 days per week.  Home exercise guidelines will be given to patient during program as part of exercise prescription that the participant will acknowledge.   Education: Aerobic Exercise: - Group verbal and visual presentation on the components of exercise prescription. Introduces F.I.T.T principle from ACSM for exercise prescriptions.  Reviews F.I.T.T. principles of aerobic exercise including progression. Written material provided at class time.   Education: Resistance Exercise: - Group verbal and visual presentation on the components of exercise prescription. Introduces F.I.T.T principle from ACSM for exercise prescriptions  Reviews F.I.T.T. principles of resistance exercise including progression. Written material provided at class time.    Education: Exercise & Equipment Safety: - Individual verbal instruction and demonstration of equipment use and safety with use of the equipment. Flowsheet Row  Cardiac Rehab from 08/23/2024 in [facility] Cardiac and Pulmonary Rehab  Date 08/23/24  Educator MB  Instruction Review Code 1- Verbalizes Understanding    Education: Exercise Physiology & General Exercise Guidelines: - Group verbal and written instruction with models to review the exercise physiology of the cardiovascular system and associated critical values. Provides general exercise guidelines with specific guidelines to those with heart or lung disease. Written material provided at class time. Flowsheet Row Cardiac Rehab from 08/23/2024 in [facility] Cardiac and Pulmonary Rehab  Education need identified 08/23/24    Education: Flexibility, Balance, Mind/Body Relaxation: - Group verbal and visual presentation with interactive activity on the components of exercise prescription. Introduces F.I.T.T principle from ACSM for exercise prescriptions. Reviews F.I.T.T. principles of flexibility and balance exercise training including progression. Also discusses the mind body connection.  Reviews various relaxation techniques to help reduce and manage stress (i.e. Deep breathing, progressive muscle relaxation, and visualization). Balance handout provided to take home. Written material provided at class time.   Activity Barriers & Risk Stratification:  Activity Barriers & Cardiac Risk Stratification - 08/23/24 0954       Activity Barriers & Cardiac Risk Stratification   Activity Barriers Joint Problems;Shortness of Breath;Other (comment)    Comments Bilateral knee and hip pain, L foot plantar fasciitis, 10 lb lifting restriction for 3 months, hearing aid    Cardiac Risk Stratification Moderate          6 Minute Walk:  6 Minute Walk     Row Name 08/23/24 0953         6 Minute Walk   Phase Initial     Distance 1135 feet     Walk Time 6 minutes     # of Rest Breaks 0     MPH 2.15     METS 2.14     RPE 9     Perceived Dyspnea  2     VO2 Peak 7.48     Symptoms No     Resting HR 72 bpm      Resting BP 110/64     Resting Oxygen Saturation  100 %     Exercise Oxygen Saturation  during 6 min walk 98 %     Max Ex. HR 98 bpm     Max Ex. BP 118/60     2 Minute Post BP 104/60        Oxygen Initial Assessment:   Oxygen Re-Evaluation:   Oxygen Discharge (Final Oxygen Re-Evaluation):   Initial Exercise Prescription:  Initial Exercise Prescription - 08/23/24 0900       Date of Initial Exercise RX and Referring Provider   Date 08/23/24    Referring Provider Darliss Rogue, MD      Oxygen   Maintain Oxygen Saturation 88% or higher      NuStep   Level 2    SPM 80    Minutes 15    METs 2.14      REL-XR   Level 1    Speed 50    Minutes 15    METs 2.14      T5 Nustep   Level 2    SPM 80    Minutes 15    METs 2.14      Track   Laps 30    Minutes 15    METs 2.63      Prescription Details   Frequency (times per week) 2    Duration Progress to 30 minutes of continuous aerobic without signs/symptoms of physical distress      Intensity   THRR 40-80% of Max Heartrate  104-136    Ratings of Perceived Exertion 11-13    Perceived Dyspnea 0-4      Progression   Progression Continue to progress workloads to maintain intensity without signs/symptoms of physical distress.      Resistance Training   Training Prescription Yes    Weight 4 lb    Reps 10-15          Perform Capillary Blood Glucose checks as needed.  Exercise Prescription Changes:   Exercise Prescription Changes     Row Name 08/23/24 0900             Response to Exercise   Blood Pressure (Admit) 110/64       Blood Pressure (Exercise) 118/60       Blood Pressure (Exit) 104/60       Heart Rate (Admit) 72 bpm       Heart Rate (Exercise) 98 bpm       Heart Rate (Exit) 78 bpm       Oxygen Saturation (Admit) 100 %       Oxygen Saturation (Exercise) 98 %       Oxygen Saturation (Exit) 99 %       Rating of Perceived Exertion (Exercise) 9       Perceived Dyspnea (Exercise) 2        Symptoms none       Comments results         Progression   Average METs 2.14          Exercise Comments:   Exercise Goals and Review:   Exercise Goals     Row Name 08/23/24 0957             Exercise Goals   Increase Physical Activity Yes       Intervention Provide advice, education, support and counseling about physical activity/exercise needs.;Develop an individualized exercise prescription for aerobic and resistive training based on initial evaluation findings, risk stratification, comorbidities and participant's personal goals.       Expected Outcomes Short Term: Attend rehab on a regular basis to increase amount of physical activity.;Long Term: Add in home exercise to make exercise part of routine and to increase amount of physical activity.;Long Term: Exercising regularly at least 3-5 days a week.       Increase Strength and Stamina Yes       Intervention Provide advice, education, support and counseling about physical activity/exercise needs.;Develop an individualized exercise prescription for aerobic and resistive training based on initial evaluation findings, risk stratification, comorbidities and participant's personal goals.       Expected Outcomes Short Term: Increase workloads from initial exercise prescription for resistance, speed, and METs.;Short Term: Perform resistance training exercises routinely during rehab and add in resistance training at home;Long Term: Improve cardiorespiratory fitness, muscular endurance and strength as measured by increased METs and functional capacity ( )       Able to understand and use rate of perceived exertion (RPE) scale Yes       Intervention Provide education and explanation on how to use RPE scale       Expected Outcomes Short Term: Able to use RPE daily in rehab to express subjective intensity level;Long Term:  Able to use RPE to guide intensity level when exercising independently       Able to understand and use Dyspnea  scale Yes       Intervention Provide education and explanation on how to use Dyspnea scale       Expected  Outcomes Short Term: Able to use Dyspnea scale daily in rehab to express subjective sense of shortness of breath during exertion;Long Term: Able to use Dyspnea scale to guide intensity level when exercising independently       Knowledge and understanding of Target Heart Rate Range (THRR) Yes       Intervention Provide education and explanation of THRR including how the numbers were predicted and where they are located for reference       Expected Outcomes Short Term: Able to state/look up THRR;Short Term: Able to use daily as guideline for intensity in rehab;Long Term: Able to use THRR to govern intensity when exercising independently       Able to check pulse independently Yes       Intervention Provide education and demonstration on how to check pulse in carotid and radial arteries.;Review the importance of being able to check your own pulse for safety during independent exercise       Expected Outcomes Short Term: Able to explain why pulse checking is important during independent exercise;Long Term: Able to check pulse independently and accurately       Understanding of Exercise Prescription Yes       Intervention Provide education, explanation, and written materials on patient's individual exercise prescription       Expected Outcomes Short Term: Able to explain program exercise prescription;Long Term: Able to explain home exercise prescription to exercise independently          Exercise Goals Re-Evaluation :  Exercise Goals Re-Evaluation     Row Name 08/28/24 1112             Exercise Goal Re-Evaluation   Exercise Goals Review Increase Physical Activity;Able to understand and use rate of perceived exertion (RPE) scale;Knowledge and understanding of Target Heart Rate Range (THRR);Understanding of Exercise Prescription;Able to check pulse independently;Able to understand and use  Dyspnea scale;Increase Strength and Stamina       Comments Reviewed RPE and dyspnea scale, THR and program prescription with pt today.  Pt voiced understanding and was given a copy of goals to take home.       Expected Outcomes Short: Use RPE daily to regulate intensity. Long: Follow program prescription in THR.          Discharge Exercise Prescription (Final Exercise Prescription Changes):  Exercise Prescription Changes - 08/23/24 0900       Response to Exercise   Blood Pressure (Admit) 110/64    Blood Pressure (Exercise) 118/60    Blood Pressure (Exit) 104/60    Heart Rate (Admit) 72 bpm    Heart Rate (Exercise) 98 bpm    Heart Rate (Exit) 78 bpm    Oxygen Saturation (Admit) 100 %    Oxygen Saturation (Exercise) 98 %    Oxygen Saturation (Exit) 99 %    Rating of Perceived Exertion (Exercise) 9    Perceived Dyspnea (Exercise) 2    Symptoms none    Comments results      Progression   Average METs 2.14          Nutrition:  Target Goals: Understanding of nutrition guidelines, daily intake of sodium 1500mg , cholesterol 200mg , calories 30% from fat and 7% or less from saturated fats, daily to have 5 or more servings of fruits and vegetables.  Education: Nutrition 1 -Group instruction provided by verbal, written material, interactive activities, discussions, models, and posters to present general guidelines for heart healthy nutrition including macronutrients, label reading, and promoting whole foods over  processed counterparts. Education serves as pensions consultant of discussion of heart healthy eating for all. Written material provided at class time.    Education: Nutrition 2 -Group instruction provided by verbal, written material, interactive activities, discussions, models, and posters to present general guidelines for heart healthy nutrition including sodium, cholesterol, and saturated fat. Providing guidance of habit forming to improve blood pressure, cholesterol, and body  weight. Written material provided at class time.     Biometrics:  Pre Biometrics - 08/23/24 0958       Pre Biometrics   Height 5' 2.2 (1.58 m)    Weight 198 lb 8 oz (90 kg)    Waist Circumference 41.5 inches    Hip Circumference 47 inches    Waist to Hip Ratio 0.88 %    BMI (Calculated) 36.07    Single Leg Stand 14.9 seconds           Nutrition Therapy Plan and Nutrition Goals:  Nutrition Therapy & Goals - 08/23/24 0958       Nutrition Therapy   RD appointment deferred Yes      Personal Nutrition Goals   Nutrition Goal RD appointment deferred at this time      Intervention Plan   Intervention Prescribe, educate and counsel regarding individualized specific dietary modifications aiming towards targeted core components such as weight, hypertension, lipid management, diabetes, heart failure and other comorbidities.    Expected Outcomes Short Term Goal: Understand basic principles of dietary content, such as calories, fat, sodium, cholesterol and nutrients.          Nutrition Assessments:  MEDIFICTS Score Key: >=70 Need to make dietary changes  40-70 Heart Healthy Diet <= 40 Therapeutic Level Cholesterol Diet  Flowsheet Row Cardiac Rehab from 08/20/2024 in [facility] Cardiac and Pulmonary Rehab  Picture Your Plate Total Score on Admission 47   Picture Your Plate Scores: <59 Unhealthy dietary pattern with much room for improvement. 41-50 Dietary pattern unlikely to meet recommendations for good health and room for improvement. 51-60 More healthful dietary pattern, with some room for improvement.  >60 Healthy dietary pattern, although there may be some specific behaviors that could be improved.    Nutrition Goals Re-Evaluation:   Nutrition Goals Discharge (Final Nutrition Goals Re-Evaluation):   Psychosocial: Target Goals: Acknowledge presence or absence of significant depression and/or stress, maximize coping skills, provide positive support system. Participant  is able to verbalize types and ability to use techniques and skills needed for reducing stress and depression.   Education: Stress, Anxiety, and Depression - Group verbal and visual presentation to define topics covered.  Reviews how body is impacted by stress, anxiety, and depression.  Also discusses healthy ways to reduce stress and to treat/manage anxiety and depression. Written material provided at class time.   Education: Sleep Hygiene -Provides group verbal and written instruction about how sleep can affect your health.  Define sleep hygiene, discuss sleep cycles and impact of sleep habits. Review good sleep hygiene tips.   Initial Review & Psychosocial Screening:  Initial Psych Review & Screening - 08/20/24 1343       Initial Review   Current issues with Current Anxiety/Panic;Current Sleep Concerns      Family Dynamics   Good Support System? Yes      Barriers   Psychosocial barriers to participate in program There are no identifiable barriers or psychosocial needs.;The patient should benefit from training in stress management and relaxation.      Screening Interventions   Interventions Encouraged to exercise;Provide feedback  about the scores to participant;To provide support and resources with identified psychosocial needs    Expected Outcomes Short Term goal: Utilizing psychosocial counselor, staff and physician to assist with identification of specific Stressors or current issues interfering with healing process. Setting desired goal for each stressor or current issue identified.;Long Term Goal: Stressors or current issues are controlled or eliminated.;Short Term goal: Identification and review with participant of any Quality of Life or Depression concerns found by scoring the questionnaire.;Long Term goal: The participant improves quality of Life and PHQ9 Scores as seen by post scores and/or verbalization of changes          Quality of Life Scores:   Quality of Life -  08/20/24 1351       Quality of Life   Select Quality of Life      Quality of Life Scores   Health/Function Pre 17.6 %    Socioeconomic Pre 23.63 %    Psych/Spiritual Pre 21 %    Family Pre 5.4 %    GLOBAL Pre 17.91 %         Scores of 19 and below usually indicate a poorer quality of life in these areas.  A difference of  2-3 points is a clinically meaningful difference.  A difference of 2-3 points in the total score of the Quality of Life Index has been associated with significant improvement in overall quality of life, self-image, physical symptoms, and general health in studies assessing change in quality of life.  PHQ-9: Review Flowsheet  More data exists      08/29/2024 08/23/2024 03/21/2023 12/17/2022 11/29/2022  Depression screen PHQ 2/9  Decreased Interest 0 1 0 0 0  Down, Depressed, Hopeless 0 0 0 0 0  PHQ - 2 Score 0 1 0 0 0  Altered sleeping 2 1 0 3 3  Tired, decreased energy 0 3 0 0 1  Change in appetite 0 0 0 0 1  Feeling bad or failure about yourself  0 1 0 0 0  Trouble concentrating 0 1 0 0 0  Moving slowly or fidgety/restless 0 0 0 0 0  Suicidal thoughts 0 0 0 0 0  PHQ-9 Score 2 7  0  3  5   Difficult doing work/chores Not difficult at all Somewhat difficult Not difficult at all Not difficult at all Somewhat difficult    Details       Data saved with a previous flowsheet row definition        Interpretation of Total Score  Total Score Depression Severity:  1-4 = Minimal depression, 5-9 = Mild depression, 10-14 = Moderate depression, 15-19 = Moderately severe depression, 20-27 = Severe depression   Psychosocial Evaluation and Intervention:  Psychosocial Evaluation - 08/20/24 1412       Psychosocial Evaluation & Interventions   Interventions Stress management education;Relaxation education;Encouraged to exercise with the program and follow exercise prescription    Comments Shari Lee is coming to cardiac rehab after a valve replacement. She states she still  feels short of breath and finds herself more anxious than before. Things like [place] the interstate and feeling unable to twist to merge make her anxious which was not the case prior to surgery. She also notes her blood sugar has been more elevated than prior to her surgery and thinks it is the stress. She is ready to come to the program to learn more about heart healthy lifestyle    Expected Outcomes Short: attend cardiac rehab for education and  exercise Long: develop and maintain positive self care habits    Continue Psychosocial Services  Follow up required by staff          Psychosocial Re-Evaluation:   Psychosocial Discharge (Final Psychosocial Re-Evaluation):   Vocational Rehabilitation: Provide vocational rehab assistance to qualifying candidates.   Vocational Rehab Evaluation & Intervention:  Vocational Rehab - 08/20/24 1343       Initial Vocational Rehab Evaluation & Intervention   Assessment shows need for Vocational Rehabilitation No          Education: Education Goals: Education classes will be provided on a variety of topics geared toward better understanding of heart health and risk factor modification. Participant will state understanding/return demonstration of topics presented as noted by education test scores.  Learning Barriers/Preferences:  Learning Barriers/Preferences - 08/20/24 1337       Learning Barriers/Preferences   Learning Barriers None    Learning Preferences None          General Cardiac Education Topics:  AED/CPR: - Group verbal and written instruction with the use of models to demonstrate the basic use of the AED with the basic ABC's of resuscitation.   Test and Procedures: - Group verbal and visual presentation and models provide information about basic cardiac anatomy and function. Reviews the testing methods done to diagnose heart disease and the outcomes of the test results. Describes the treatment choices: Medical Management,  Angioplasty, or Coronary Bypass Surgery for treating various heart conditions including Myocardial Infarction, Angina, Valve Disease, and Cardiac Arrhythmias. Written material provided at class time. Flowsheet Row Cardiac Rehab from 08/23/2024 in [facility] Cardiac and Pulmonary Rehab  Education need identified 08/23/24    Medication Safety: - Group verbal and visual instruction to review commonly prescribed medications for heart and lung disease. Reviews the medication, class of the drug, and side effects. Includes the steps to properly store meds and maintain the prescription regimen. Written material provided at class time.   Intimacy: - Group verbal instruction through game format to discuss how heart and lung disease can affect sexual intimacy. Written material provided at class time.   Know Your Numbers and Heart Failure: - Group verbal and visual instruction to discuss disease risk factors for cardiac and pulmonary disease and treatment options.  Reviews associated critical values for Overweight/Obesity, Hypertension, Cholesterol, and Diabetes.  Discusses basics of heart failure: signs/symptoms and treatments.  Introduces Heart Failure Zone chart for action plan for heart failure. Written material provided at class time. Flowsheet Row Cardiac Rehab from 08/23/2024 in [facility] Cardiac and Pulmonary Rehab  Education need identified 08/23/24    Infection Prevention: - Provides verbal and written material to individual with discussion of infection control including proper hand washing and proper equipment cleaning during exercise session. Flowsheet Row Cardiac Rehab from 08/23/2024 in [facility] Cardiac and Pulmonary Rehab  Date 08/23/24  Educator MB  Instruction Review Code 1- Verbalizes Understanding    Falls Prevention: - Provides verbal and written material to individual with discussion of falls prevention and safety. Flowsheet Row Cardiac Rehab from 08/23/2024 in [facility] Cardiac and Pulmonary Rehab   Date 08/23/24  Educator MB  Instruction Review Code 1- Verbalizes Understanding    Other: -Provides group and verbal instruction on various topics (see comments)   Knowledge Questionnaire Score:  Knowledge Questionnaire Score - 08/20/24 1351       Knowledge Questionnaire Score   Pre Score 22/26          Core Components/Risk Factors/Patient Goals at Admission:  Personal  Goals and Risk Factors at Admission - 08/23/24 0959       Core Components/Risk Factors/Patient Goals on Admission    Weight Management Yes;Weight Maintenance   Does not like to set specific weight goal, focuses on healthy lifestyle   Intervention Weight Management: Develop a combined nutrition and exercise program designed to reach desired caloric intake, while maintaining appropriate intake of nutrient and fiber, sodium and fats, and appropriate energy expenditure required for the weight goal.;Weight Management: Provide education and appropriate resources to help participant work on and attain dietary goals.;Weight Management/Obesity: Establish reasonable short term and long term weight goals.    Expected Outcomes Short Term: Continue to assess and modify interventions until short term weight is achieved;Long Term: Adherence to nutrition and physical activity/exercise program aimed toward attainment of established weight goal;Understanding recommendations for meals to include 15-35% energy as protein, 25-35% energy from fat, 35-60% energy from carbohydrates, less than 200mg  of dietary cholesterol, 20-35 gm of total fiber daily;Understanding of distribution of calorie intake throughout the day with the consumption of 4-5 meals/snacks;Weight Maintenance: Understanding of the daily nutrition guidelines, which includes 25-35% calories from fat, 7% or less cal from saturated fats, less than 200mg  cholesterol, less than 1.5gm of sodium, & 5 or more servings of fruits and vegetables daily    Diabetes Yes    Intervention  Provide education about signs/symptoms and action to take for hypo/hyperglycemia.;Provide education about proper nutrition, including hydration, and aerobic/resistive exercise prescription along with prescribed medications to achieve blood glucose in normal ranges: Fasting glucose 65-99 mg/dL    Expected Outcomes Short Term: Participant verbalizes understanding of the signs/symptoms and immediate care of hyper/hypoglycemia, proper foot care and importance of medication, aerobic/resistive exercise and nutrition plan for blood glucose control.;Long Term: Attainment of HbA1C < 7%.    Hypertension Yes    Intervention Provide education on lifestyle modifcations including regular physical activity/exercise, weight management, moderate sodium restriction and increased consumption of fresh fruit, vegetables, and low fat dairy, alcohol moderation, and smoking cessation.;Monitor prescription use compliance.    Expected Outcomes Short Term: Continued assessment and intervention until BP is < 140/76mm HG in hypertensive participants. < 130/32mm HG in hypertensive participants with diabetes, heart failure or chronic kidney disease.;Long Term: Maintenance of blood pressure at goal levels.    Lipids Yes    Intervention Provide education and support for participant on nutrition & aerobic/resistive exercise along with prescribed medications to achieve LDL 70mg , HDL >40mg .    Expected Outcomes Short Term: Participant states understanding of desired cholesterol values and is compliant with medications prescribed. Participant is following exercise prescription and nutrition guidelines.;Long Term: Cholesterol controlled with medications as prescribed, with individualized exercise RX and with personalized nutrition plan. Value goals: LDL < 70mg , HDL > 40 mg.          Education:Diabetes - Individual verbal and written instruction to review signs/symptoms of diabetes, desired ranges of glucose level fasting, after meals and  with exercise. Acknowledge that pre and post exercise glucose checks will be done for 3 sessions at entry of program. Flowsheet Row Cardiac Rehab from 08/23/2024 in [facility] Cardiac and Pulmonary Rehab  Date 08/23/24  Educator MB  Instruction Review Code 1- Verbalizes Understanding    Core Components/Risk Factors/Patient Goals Review:    Core Components/Risk Factors/Patient Goals at Discharge (Final Review):    ITP Comments:  ITP Comments     Row Name 08/20/24 1410 08/23/24 0953 08/28/24 1112 09/05/24 1048     ITP Comments Initial phone call completed.  Diagnosis can be found in [facility] 9/18. EP Orientation scheduled for Thursday 11/6 at 8am. Completed and gym orientation for cardiac rehab. Initial ITP created and sent for review to Dr. Oneil Pinal, Medical Director. First full day of exercise!  Patient was oriented to gym and equipment including functions, settings, policies, and procedures.  Patient's individual exercise prescription and treatment plan were reviewed.  All starting workloads were established based on the results of the 6 minute walk test done at initial orientation visit.  The plan for exercise progression was also introduced and progression will be customized based on patient's performance and goals. 30 Day review completed. Medical Director ITP review done, changes made as directed, and signed approval by Medical Director. New to program       Comments: 30 day review ITP

## 2024-09-06 ENCOUNTER — Other Ambulatory Visit: Payer: Self-pay | Admitting: Pharmacist

## 2024-09-06 ENCOUNTER — Ambulatory Visit: Attending: Cardiology | Admitting: Cardiology

## 2024-09-06 ENCOUNTER — Encounter

## 2024-09-06 ENCOUNTER — Encounter: Payer: Self-pay | Admitting: Cardiology

## 2024-09-06 ENCOUNTER — Telehealth: Payer: Self-pay | Admitting: Family Medicine

## 2024-09-06 ENCOUNTER — Encounter: Payer: Self-pay | Admitting: Pharmacist

## 2024-09-06 ENCOUNTER — Encounter: Admitting: Emergency Medicine

## 2024-09-06 VITALS — BP 115/60 | HR 56 | Ht 62.0 in | Wt 200.4 lb

## 2024-09-06 DIAGNOSIS — E1159 Type 2 diabetes mellitus with other circulatory complications: Secondary | ICD-10-CM

## 2024-09-06 DIAGNOSIS — Z952 Presence of prosthetic heart valve: Secondary | ICD-10-CM | POA: Diagnosis not present

## 2024-09-06 DIAGNOSIS — I2510 Atherosclerotic heart disease of native coronary artery without angina pectoris: Secondary | ICD-10-CM

## 2024-09-06 DIAGNOSIS — R001 Bradycardia, unspecified: Secondary | ICD-10-CM

## 2024-09-06 DIAGNOSIS — I4891 Unspecified atrial fibrillation: Secondary | ICD-10-CM

## 2024-09-06 DIAGNOSIS — I350 Nonrheumatic aortic (valve) stenosis: Secondary | ICD-10-CM | POA: Diagnosis not present

## 2024-09-06 DIAGNOSIS — I9789 Other postprocedural complications and disorders of the circulatory system, not elsewhere classified: Secondary | ICD-10-CM

## 2024-09-06 DIAGNOSIS — E1169 Type 2 diabetes mellitus with other specified complication: Secondary | ICD-10-CM | POA: Diagnosis not present

## 2024-09-06 DIAGNOSIS — R0602 Shortness of breath: Secondary | ICD-10-CM

## 2024-09-06 DIAGNOSIS — E118 Type 2 diabetes mellitus with unspecified complications: Secondary | ICD-10-CM

## 2024-09-06 DIAGNOSIS — Z794 Long term (current) use of insulin: Secondary | ICD-10-CM

## 2024-09-06 DIAGNOSIS — E785 Hyperlipidemia, unspecified: Secondary | ICD-10-CM

## 2024-09-06 DIAGNOSIS — I152 Hypertension secondary to endocrine disorders: Secondary | ICD-10-CM

## 2024-09-06 MED ORDER — AMIODARONE HCL 200 MG PO TABS: 100.0000 mg | ORAL_TABLET | Freq: Every day | ORAL | Status: DC

## 2024-09-06 NOTE — Telephone Encounter (Signed)
 Copied from CRM [id number]. Topic: General - Other >> Sep 06, 2024  8:35 AM Rea ORN wrote:  Reason for CRM: Pt would like Tammy Eckard to call her regarding appt today. Pt has an appt at 10:05 and then an appt with Tammy at 11. Pt would like to move the appt out to 1 pm or after. Please call back  [phone]

## 2024-09-06 NOTE — Progress Notes (Signed)
 Cardiology Office Note   Date:  09/06/2024  ID:  Shari Lee, DOB September 12, 1956, MRN [id number] PCP: Almarie Waddell NOVAK, NP  [facility] Providers Cardiologist:  Redell Cave, MD Cardiology APP:  Gerard Frederick, NP     History of Present Illness Shari Lee is a 68 y.o. female with a past medical history of primary pretension, type 2 diabetes, severe aortic stenosis, bicuspid aortic valve status post TAVR, heart murmur, who presents today for follow-up.   She was seen in clinic 02/11/2022 by Dr.Agbor-Etang.  She was.  Echocardiogram she had previously been evaluated by her PCP and was noted to have a cardiac murmur.  Echocardiogram was obtained to evaluate any structural abnormalities.  Patient clinically was asymptomatic denying chest pain shortness of breath or palpitations.  Echocardiogram revealing normal EF 60 to 65%, moderate aortic valve stenosis with mean gradient 25 mmHg recommendation was for repeat echo yearly.   She was seen in clinic on/16/25 stating that overall from a cardiac perspective she had been doing well.  She did have a 30 pound weight loss but states that she ended up with pancreatitis from Ozempic .  She was scheduled for an updated echocardiogram for aortic valve stenosis.  There were no further medication changes that were made.  She was last seen in clinic 03/13/2024 with exertional dyspnea, occasional lightheadedness and dizziness when getting out of bed but denies any syncope or near syncope.  She also noted increased fatigue with activity.  Echocardiogram completed on 5/19 revealed an LVEF of 60 to 65%, no RWMA, mild LVH, G1 DD, right ventricular systolic function was normal and right ventricular size was normal, bicuspid aortic valve noted aortic regurgitation was not visualized but severe aortic valve stenosis with aortic valve area by VTI measuring 0.58 cm and aortic valve mean gradient measuring 48 mmHg.  She was scheduled for a left heart  catheterization for further evaluation of aortic valve stenosis and coronary artery disease with a referral to structural team.  Her left heart catheterization showed a proximal LAD lesion that was 10% stenosis, minor irregularities with no evidence of obstructive coronary artery disease, right heart catheterization showed normal right and left-sided filling pressures, normal pulmonary pressure and normal cardiac output.  She was seen in clinic 07/03/2024 by Dr. Shlomo.  At that time she was referred for TAVR protocol CTA and cardiothoracic surgical opinion.  She was started on aspirin  81 mg daily and continued on ezetimibe  10 mg daily and to continue recommendations of considering statin therapy.  No other medication changes were made at that time.  She was evaluated by Dr. Shyrl on 06/15/2024 for severe aortic stenosis.  After testing was reviewed and symptoms were evaluated due to her bicuspid aortic valve the risks and benefits of bioprosthetic AVR were discussed in detail and patient was agreeable to proceed.  Her procedure was then scheduled.  She was hospitalized at [facility] from 9/18 - 07/07/2024 for severe aortic stenosis and underwent an elective aortic valve replacement.  She had a bioprosthetic valve that was placed.  Postoperative hospital course she did well.  She developed postoperative atrial fibrillation and was subsequently chemically cardioverted with amiodarone .  All routine lines, monitors, and drainage devices were discontinued as anticipated.  She did have expected acute blood loss anemia which stabilized.  She maintained normal renal function.  There was a noted reactive leukocytosis that was monitored clinically.  She was seen by physical therapy to assist with postoperative mobilization due to deconditioning.  She  made slow but steady improvement in that regard.  She did develop a phlebitis associated with amiodarone  infusion with subsequent development of a mild  cellulitis.  She was started on Keflex  for the cellulitis.  Her blood pressure was difficult to control and her home medications were uptitrated to higher dosing.  She was also started on hydrochlorothiazide .  She was discharged home on her diabetic regimen.  Overall at the time of discharge she was felt to be quite stable.  She was to be referred to cardiac rehab.  She was to stop atenolol  continue with amiodarone , amlodipine , aspirin , benazepril , Farxiga , ezetimibe , hydrochlorothiazide , metoprolol  tartrate.  Follow-up 6 week post op echocardiogram was ordered.   She was last seen in clinic 07/26/2024 accompanied by family member.  Overall she had been doing well from a cardiac perspective.  She denied any chest pain and primarily had incisional pain.  Her incisions were healing well.  She was sent for labs and discussed cardiac rehab.  She was advised she would have to be cleared from surgery prior to starting rehab.  Follow-up labs her serum creatinine was elevated furosemide  was stopped with an additional repeat BMP ordered.  She was restarted on amiodarone  200 mg daily for the minimum recommendation of 3 months due to postoperative atrial fibrillation.  She was encouraged to maintain all follow-ups with CT surgery and structural for her 8-month scheduled echocardiogram.  There were no other medication changes that were made and further testing that was ordered at the time.   She returns to clinic today frustrated and upset.  She states that she continues to have dyspnea on exertion.  She has been prescribed 2 separate inhalers but on a fixed income, she is unable to afford them.  She has recently been released to cardiac rehab and states that she is going that this afternoon.  She states that her dyspnea has worsened and she has to sit down even after taking a shower.  She denies any palpitations or reoccurrence of atrial fibrillation.  She states that she continues to have some occasional chest discomfort  with sneezing and coughing.  This is slightly improving.  She has been compliant with her current medication regimen.  Denies any recent hospitalizations or visits to the emergency department.  Has had her updated echocardiogram completed.  ROS: 10 point review of system has been reviewed and considered negative except ones been listed in the HPI  Studies Reviewed EKG Interpretation Date/Time:  Thursday September 06 2024 10:07:57 EST Ventricular Rate:  56 PR Interval:  228 QRS Duration:  92 QT Interval:  468 QTC Calculation: 451 R Axis:   1  Text Interpretation: Sinus bradycardia with 1st degree A-V block When compared with ECG of 26-Jul-2024 14:22, No significant change was found Confirmed by Gerard Frederick ([id number]) on 09/06/2024 10:13:27 AM    2d echo 08/17/2024 1. Left ventricular ejection fraction, by estimation, is 60 to 65%. Left  ventricular ejection fraction by 3D volume is 65 %. The left ventricle has  normal function. The left ventricle has no regional wall motion  abnormalities. There is moderate  concentric left ventricular hypertrophy. Left ventricular diastolic  parameters are consistent with Grade I diastolic dysfunction (impaired  relaxation).   2. Right ventricular systolic function is normal. The right ventricular  size is normal. Tricuspid regurgitation signal is inadequate for assessing  PA pressure.   3. The mitral valve is normal in structure. Trivial mitral valve  regurgitation. No evidence of mitral stenosis.  4. The aortic valve has been repaired/replaced. Aortic valve  regurgitation is not visualized. No aortic stenosis is present. There is a  21 mm Inspiris bioprosthetic valve present in the aortic position.  Procedure Date: 07/05/2024. Aortic valve area, by  VTI measures 2.05 cm. Aortic valve mean gradient measures 13.0 mmHg.  Aortic valve Vmax measures 2.38 m/s. Normal function of AV bioprosthesis.   5. The inferior vena cava is normal in size with  greater than 50%  respiratory variability, suggesting right atrial pressure of 3 mmHg.   TEE 07/05/2024 POST-OP IMPRESSIONS  _ Left Ventricle: has hyperdynamic systolic function, with an ejection  fraction  of 70%. The cavity size was decreased. The wall motion is normal.  _ Right Ventricle: normal function. The wall motion is normal.  _ Aorta: there is no dissection present in the aorta.  _ Aortic Valve: A bioprosthetic bioprosthetic valve was placed, leaflets  are  freely mobile and leaflets thin. No regurgitation post repair. The  gradient  recorded across the prosthetic valve is within the expected range,  measuring 10  cm/s. No perivalvular leak noted.  _ Mitral Valve: There is mild regurgitation.  _ Tricuspid Valve: There is trivial regurgitation.   PRE-OP FINDINGS   Left Ventricle: The left ventricle has hyperdynamic systolic function,  with an ejection fraction of >65%. The cavity size was decreased. No  evidence of left ventricular regional wall motion abnormalities. There is  severe concentric left ventricular  hypertrophy.    LHC 03/26/2024   Prox LAD lesion is 10% stenosed.   1.  Minor irregularities with no evidence of obstructive coronary artery disease. 2.  Severe aortic stenosis by echo.  I did not attempt to cross the aortic valve. 3.  Right heart catheterization showed normal right and left-sided filling pressures, normal pulmonary pressure and normal cardiac output.   Recommendations: Refer to the structural heart clinic for evaluation of bicuspid aortic valve replacement.   2D echo 03/05/2024 1. Left ventricular ejection fraction, by estimation, is 60 to 65%. Left  ventricular ejection fraction by PLAX is 63 %. The left ventricle has  normal function. The left ventricle has no regional wall motion  abnormalities. There is mild left ventricular  hypertrophy. Left ventricular diastolic parameters are consistent with  Grade I diastolic dysfunction (impaired  relaxation).   2. Right ventricular systolic function is normal. The right ventricular  size is normal.   3. The mitral valve is grossly normal. No evidence of mitral valve  regurgitation.   4. The aortic valve is bicuspid. Aortic valve regurgitation is not  visualized. Severe aortic valve stenosis. Aortic valve area, by VTI  measures 0.58 cm. Aortic valve mean gradient measures 48.0 mmHg. Aortic  valve Vmax measures 4.92 m/s.   5. The inferior vena cava is normal in size with greater than 50%  respiratory variability, suggesting right atrial pressure of 3 mmHg.    2D echo 01/27/2022 1. Left ventricular ejection fraction, by estimation, is 60 to 65%. The  left ventricle has normal function. The left ventricle has no regional  wall motion abnormalities. There is moderate left ventricular hypertrophy.  Left ventricular diastolic  parameters are consistent with Grade I diastolic dysfunction (impaired  relaxation). The average left ventricular global longitudinal strain is  -17.5 %. The global longitudinal strain is normal.   2. Right ventricular systolic function is normal. The right ventricular  size is normal.   3. Left atrial size was mildly dilated.   4. The mitral valve  is normal in structure. No evidence of mitral valve  regurgitation. No evidence of mitral stenosis.   5. The aortic valve is normal in structure. There is moderate  calcification of the aortic valve. Aortic valve regurgitation is not  visualized. Moderate aortic valve stenosis. Aortic valve area, by VTI  measures 0.82 cm. Aortic valve mean gradient  measures 25.3 mmHg. Aortic valve Vmax measures 3.36 m/s.   6. The inferior vena cava is normal in size with greater than 50%  respiratory variability, suggesting right atrial pressure of 3 mmHg.   Risk Assessment/Calculations              Physical Exam VS:  BP 115/60 (BP Location: Left Arm, Patient Position: Sitting, Cuff Size: Normal)   Pulse (!) 56 Comment: 59  oximeter  Ht 5' 2 (1.575 m)   Wt 200 lb 6.4 oz (90.9 kg)   SpO2 98%   BMI 36.65 kg/m        Wt Readings from Last 3 Encounters:  09/06/24 200 lb 6.4 oz (90.9 kg)  08/29/24 202 lb (91.6 kg)  08/23/24 198 lb 8 oz (90 kg)    GEN: Well nourished, well developed in no acute distress NECK: No JVD; No carotid bruits CARDIAC: RRR, II/VI systolic murmur RUSB, without rubs or gallops RESPIRATORY:  Clear to auscultation without rales, wheezing or rhonchi  ABDOMEN: Soft, non-tender, non-distended EXTREMITIES:  No edema; No deformity   ASSESSMENT AND PLAN Severe aortic stenosis/bicuspid aortic valve status post TAVR which she has done well from a cardiac perspective.  Recent echocardiogram revealed an LVEF of 60 to 65%, no RWMA, G1 DD, trivial mitral regurgitation and aortic valve replacement sitting in the appropriate position.  She has been released from [facility] for cardiac rehab and has started rehab at this point.   Ongoing dyspnea and shortness of breath that is likely deconditioning and multifactorial.  She has followed up and was prescribed multiple inhalers which she states were too expensive.  She has been encouraged to try to use her rescue inhaler prior to increased activity to prevent bronchospasms.  Recent echocardiogram showed stable LVEF with no changes related to dyspnea.  She is euvolemic on exam with an actual reduction in her weight instead of again.  She has been encouraged to continue with increasing her activity as tolerated.  Of note we are decreasing her amiodarone  dosing today from 200 mg daily to 100 mg daily with potential discontinuation at the end of December.  Postoperative atrial fibrillation she has been maintaining sinus rhythm.  EKG today reveals sinus bradycardia with a rate of 56 with first-degree AV block with no acute ischemic changes.  She has been continued on amiodarone  with reducing the dose from 200 mg daily to 100 mg daily as well as 37-1/2 mg twice daily of  metoprolol .  With reduction in amiodarone  and there was no changes to metoprolol  today.  TSH continues to improve and free T4 remains normal.  She is encouraged to continue to monitor her heart rate on her Fitbit.  She had an isolated incident and is not on OAC.  Without reoccurrence of atrial fibrillation amiodarone  will be discontinued on 10/08/2024 if she would have completed a 76-month course of amiodarone .  We did discuss potential side effects of amiodarone  today causing some of her dyspnea.  Patient is in agreement to decrease dose with discontinuation.  With her continued on metoprolol  as well if she continues to be bradycardic we will likely have to reduce metoprolol   dosing on return.  Nonobstructive coronary artery disease with prior proximal LAD lesion LAD 10% with moderate irregularities without evidence of obstructive coronary artery disease.  She has been continued on aspirin  325 mg daily and ezetimibe  10 mg daily.  Stated that she just recently had labs completed by her PCP.  EKG with no ischemic changes noted today no further ischemic evaluation needed.  Primary hypertension with a blood pressure of 115/60.  Blood pressures remained well-controlled.  She is continued on amlodipine , benazepril , HCTZ, metoprolol  tartrate.  She has been encouraged to continue to monitor her pressures 1 to 2 hours postmedication administration as well.  Mixed hyperlipidemia with a last LDL of 110 with a goal ideally less than 55 with a history of diabetes.  Ongoing management per PCP.  Type 2 diabetes with a hemoglobin A1c of 8.9.  She is continued on insulin , on Farxiga , [name] .  Not ideally controlled which could inhibit her overall healing process.  Ongoing management per her PCP.  Sinus bradycardia with first-degree AV block with rate of 56 today on EKG.  She is currently on amiodarone  and metoprolol  to tartrate.  Amiodarone  was decreased today will work on decreasing metoprolol  if no reoccurrence of atrial  fibrillation on return.  Will continue to monitor with surveillance studies.       Dispo: Patient to return to clinic to see MD/APP in 2 months or sooner if needed for further evaluation  Signed, Mumin Denomme, NP

## 2024-09-06 NOTE — Progress Notes (Signed)
 09/06/2024 Name: Shari Lee MRN: [id number] DOB: March 21, 1956  No chief complaint on file.   Shari Lee is a 68 y.o. year old female who presented for a telephone visit.   They were referred to the pharmacist by their PCP for assistance in managing diabetes and medication access.    Subjective:   Medication Access/Adherence  Current Pharmacy:  [facility] [address] ([address]), [place] - [address] [place]) [place] [address] Phone: [phone] Fax: [phone]  [facility] - [place], [place] - [address] [place] [address] Phone: [phone] Fax: [phone]  [name] [address] [place] [address] Phone: [phone] Fax: [phone]   Patient reports affordability concerns with their medications: Yes  - she has not been able to afford maintenance inhalers prescribed for SOB Patient reports access/transportation concerns to their pharmacy: No  Patient reports adherence concerns with their medications:  Yes     Diabetes:  Current medications:  [name]  at 21 units once a day  Novolog  / insulin  aspart 10 units prior to each meal. Add 3 units for high carbohydrate meals. Hold if blood glucose is 100 or less. Approved to get thru [facility] thru 10/17/2024 Farxiga  10mg  daily - getting from [facility] and Me patient assistance program - approved thru 10/17/2024.    Medications tried in the past: Ozempic  - stopped due to pancreatitis; metformin  - worsened IBS related diarrhea   Diet - admits that when she was recently visiting family she had hot dogs and french fries and a milkshake which caused her blood glucose in increase quit a bit.    Date of Download: 08/24/2024 to 09/06/2024 % Time CGM is active: 98%  Average Glucose: 199 mg/dL Glucose Management Indicator: 8.1%  Glucose Variability: 28.7% (goal <36%)  Time in Goal:  - Time in range 70-180: 41%  -  Time in high range 181 to 250: 41% - Time in very high range > 250: 18%  - Time below range: 0%   Blood glucose trends -  Post prandial highs have increased due to dietary indiscretion.  She report eating 2 cinnamon rolls last night and blood glucose was very high late lat night and early this morning     She has started using her stationary bike a few times and is attending cardio rehab 2 times per week.  She is still getting shortness of breath. She has albuterol  to use as needed but was not about to afford Wixela ($52) or Symbicort - cost was $200    Wt Readings from Last 3 Encounters:  09/06/24 200 lb 6.4 oz (90.9 kg)  08/29/24 202 lb (91.6 kg)  08/23/24 198 lb 8 oz (90 kg)     Objective:  Lab Results  Component Value Date   HGBA1C 8.9 (H) 07/03/2024   BP Readings from Last 3 Encounters:  09/06/24 115/60  08/29/24 (!) 106/53  08/10/24 137/83     Lab Results  Component Value Date   CREATININE 1.21 (H) 08/16/2024   BUN 22 08/16/2024   NA 138 08/16/2024   K 5.1 08/16/2024   CL 102 08/16/2024   CO2 19 (L) 08/16/2024    Lab Results  Component Value Date   CHOL 189 01/24/2024   HDL 55.40 01/24/2024   LDLCALC 110 (H) 01/24/2024   TRIG 119.0 01/24/2024   CHOLHDL 3 01/24/2024    Medications Reviewed Today  Reviewed by Carla Milling, RPH-CPP (Pharmacist) on 09/06/24 at 1620  Med List Status: <None>   Medication Order Taking? Sig Documenting Provider Last Dose Status Informant  albuterol  (VENTOLIN  HFA) 108 (90 Base) MCG/ACT inhaler [id number]  Inhale 2 puffs into the lungs every 6 (six) hours as needed for wheezing or shortness of breath. Almarie Waddell NOVAK, NP  Active   amiodarone  (PACERONE ) 200 MG tablet [id number] Yes Take 0.5 tablets (100 mg total) by mouth daily. Gerard Frederick, NP  Active   amLODipine  (NORVASC ) 10 MG tablet [id number] Yes Take 1 tablet (10 mg total) by mouth daily. Gerard Frederick, NP  Active   aspirin  EC 325 MG tablet [id number] Yes Take 1  tablet (325 mg total) by mouth daily. Gold, Wayne E, PA-C  Active   benazepril  (LOTENSIN ) 40 MG tablet [id number] Yes Take 1 tablet (40 mg total) by mouth daily. Gerard Frederick, NP  Active   Blood Glucose Monitoring Suppl (CONTOUR NEXT USB MONITOR) w/Device KIT [id number]  1 kit by Does not apply route daily. To check blood sugar once daily. Vivienne Delon HERO, PA-C  Active Self  CareTouch Safety Lancets 26G MISC [id number]  1 Device by Does not apply route daily. To check blood sugar once daily. Vivienne Delon HERO, PA-C  Active Self  cetirizine  (ZYRTEC ) 10 MG tablet [id number]  Take 1 tablet by mouth once daily Drubel, Manuelita, PA-C  Active Self  Continuous Glucose Sensor (FREESTYLE LIBRE 3 PLUS SENSOR) MISC [id number]  USE AS DIRECTED. CHANGE SENSOR EVERY 15 DAYS. Domenica Harlene LABOR, MD  Active   dapagliflozin  propanediol (FARXIGA ) 10 MG TABS tablet [id number]  Take 1 tablet (10 mg total) by mouth daily. For [place]25 [place] Patient [place] Eleanor, [place], PA-C  Active Self  ezetimibe  (ZETIA ) 10 MG tablet [id number]  Take 1 tablet (10 mg total) by mouth daily. Domenica Harlene LABOR, MD  Active   fluticasone -salmeterol GARETH INHUB) 100-50 MCG/ACT AEPB [id number]  Inhale 1 puff into the lungs 2 (two) times daily. Almarie Waddell NOVAK, NP  Active   glucose blood (CONTOUR NEXT TEST) test strip [id number]  To check blood sugar once daily. Vivienne Delon HERO, PA-C  Active Self  hydrochlorothiazide  (HYDRODIURIL ) 12.5 MG tablet [id number]  Take 1 tablet (12.5 mg total) by mouth daily. Gerard Frederick, NP  Active   insulin  aspart (NOVOLOG ) 100 UNIT/ML FlexPen [id number]  Inject 4 to 10 units up to 3 times a day with a meal. Cyndi Manuelita, PA-C  Active Self  insulin  degludec ([name]  FLEXTOUCH) 100 UNIT/ML FlexTouch Pen [id number] Yes Inject 15 Units into the skin daily.  Patient taking differently: Inject 21 Units into the skin daily.   Carla Milling, RPH-CPP  Active   Insulin  Pen Needle (NOVOFINE PLUS) 32G X 4 MM MISC  [id number]  To use with ozempic  injections Burnette, Jennifer M, PA-C  Active Self  metoprolol  tartrate (LOPRESSOR ) 25 MG tablet [id number] Yes Take 1.5 tablets (37.5 mg total) by mouth 2 (two) times daily. Gerard Frederick, NP  Active   montelukast  (SINGULAIR ) 10 MG tablet [id number] Yes TAKE 1 TABLET BY MOUTH AT BEDTIME Cyndi Manuelita, PA-C  Active Self  omeprazole (PRILOSEC OTC) 20 MG tablet [id number] Yes Take 1 tablet (20 mg total) by mouth daily. Almarie Waddell NOVAK, NP  Active               Assessment/Plan:  Diabetes: blood glucose and GMI over the last 2 weeks has not been at goal but had improved - Recommended if FBG is > 150  tomorrow morning to increase [name]  to 23 units once a day - will continue to adjust based on home Continuous Glucose Monitor report -  Continue Novolog  / insulin  aspart 8 units prior to each meal. She will add 3 additional unit if having a meal that is high in carbohydrates or sweets- like cinnamon rolls, cereal, pasta, rice, potatoes or bread. Hold if blood glucose is 100 or less. Discussed importance of taking with each meal to prevent blood glucose excursions.  - Discussed diet and diabetes - recommended limit intake of high fat foods and foods high in sugar like cakes and cinnamon rolls.  - Continue Farxiga  10mg  daily.  - Continue to use Libre 3 sensors to check blood glucose continuously.  Reviewed her 2025 insurance plan with [facility]. I was not able to find an inhaler with ICS+LABA that was lower than tier 3. All tier 3 meds will have a deductible she will need to meet. Lowest cost I could find for her would be Wixela or generic Advair / fluticasone +salmeterol inhaler which both would be $52 for the first fill and then $47 / month. Their is a patient assistance program with [employer] but patient must spend $600 out of pocket first.  Possible to use triple therapy inhaler - like Breztri - could try to get thru [place] and [place] progrma which she is already enrolled  in. Will check with Waddell Mon who initially prescribed inhalers.    Follow up planned 1 to 2 weeks.   [name], PharmD Clinical Pharmacist [facility]  Population Health [phone]

## 2024-09-06 NOTE — Telephone Encounter (Signed)
 Spoke with patient - rescheduled appt for 3:30pm today.

## 2024-09-06 NOTE — Patient Instructions (Signed)
 Medication Instructions:  Your physician recommends the following medication changes.  DECREASE: Amiodarone  to 100 mg daily stop taking on 10/08/2024  *If you need a refill on your cardiac medications before your next appointment, please call your pharmacy*  Lab Work: No labs ordered today  If you have labs (blood work) drawn today and your tests are completely normal, you will receive your results only by: MyChart Message (if you have MyChart) OR A paper copy in the mail If you have any lab test that is abnormal or we need to change your treatment, we will call you to review the results.  Testing/Procedures: No test ordered today   Follow-Up: At [facility], you and your health needs are our priority.  As part of our continuing mission to provide you with exceptional heart care, our providers are all part of one team.  This team includes your primary Cardiologist (physician) and Advanced Practice Providers or APPs (Physician Assistants and Nurse Practitioners) who all work together to provide you with the care you need, when you need it.  Your next appointment:   2 month(s)  Provider:   You may see Redell Cave, MD or one of the following Advanced Practice Providers on your designated Care Team:    Tylene Lunch, NP

## 2024-09-06 NOTE — Progress Notes (Signed)
 Daily Session Note  Patient Details  Name: Shari Lee MRN: [id number] Date of Birth: 1956/10/18 Referring Provider:   Flowsheet Row Cardiac Rehab from 08/23/2024 in [facility] Cardiac and Pulmonary Rehab  Referring Provider Darliss Rogue, MD    Encounter Date: 09/06/2024  Check In:  Session Check In - 09/06/24 1117       Check-In   Supervising physician immediately available to respond to emergencies See telemetry face sheet for immediately available ER MD    Location [facility]-Cardiac & Pulmonary Rehab    Staff Present Leita Franks RN,BSN;[facility] BS, Exercise Physiologist;Margaret Best, MS, Exercise Physiologist;Jason Elnor RDN,LDN    Virtual Visit No    Medication changes reported     No    Fall or balance concerns reported    No    Warm-up and Cool-down Performed on first and last piece of equipment    Resistance Training Performed Yes    VAD Patient? No    PAD/SET Patient? No      Pain Assessment   Currently in Pain? No/denies             Social History   Tobacco Use  Smoking Status Never  Smokeless Tobacco Never    Goals Met:  Independence with exercise equipment Exercise tolerated well No report of concerns or symptoms today Strength training completed today  Goals Unmet:  Not Applicable  Comments: Pt able to follow exercise prescription today without complaint.  Will continue to monitor for progression.    Dr. Oneil Pinal is Medical Director for [facility] Cardiac Rehabilitation.  Dr. Fuad Aleskerov is Medical Director for [facility] Pulmonary Rehabilitation.

## 2024-09-07 ENCOUNTER — Telehealth: Payer: Self-pay | Admitting: Pharmacist

## 2024-09-07 DIAGNOSIS — J4520 Mild intermittent asthma, uncomplicated: Secondary | ICD-10-CM

## 2024-09-07 DIAGNOSIS — J45909 Unspecified asthma, uncomplicated: Secondary | ICD-10-CM | POA: Insufficient documentation

## 2024-09-07 NOTE — Telephone Encounter (Signed)
 Received provider portion of Pap application Farxiga . Left v/m with patient inquiring about receiving PAP application via mail earlier this month.

## 2024-09-07 NOTE — Progress Notes (Signed)
 Received notification that asthma fund for copay relief was open and patient had not been able to afford either Symbicort  or Wixela that was prescribed by Waddell Mon last month.  Assisted patient with applying for copay relief fund. She was approved - up to $2000 thru 09/07/2025.  Tried to contact [employer] to provide info but had to leave a message with information below.

## 2024-09-07 NOTE — Telephone Encounter (Signed)
 PAP: Application for Novolog  and [name]  has been submitted to [employer] [facility], via fax

## 2024-09-11 ENCOUNTER — Encounter

## 2024-09-11 DIAGNOSIS — Z952 Presence of prosthetic heart valve: Secondary | ICD-10-CM | POA: Diagnosis not present

## 2024-09-11 NOTE — Progress Notes (Signed)
 Daily Session Note  Patient Details  Name: Shari Lee MRN: [id number] Date of Birth: 21-Jun-1956 Referring Provider:   Flowsheet Row Cardiac Rehab from 08/23/2024 in [facility] Cardiac and Pulmonary Rehab  Referring Provider Darliss Rogue, MD    Encounter Date: 09/11/2024  Check In:  Session Check In - 09/11/24 1101       Check-In   Supervising physician immediately available to respond to emergencies See telemetry face sheet for immediately available ER MD    Location [facility]-Cardiac & Pulmonary Rehab    Staff Present Burnard Davenport RN,BSN,MPA;Maxon Conetta BS, Exercise Physiologist;Margaret Best, MS, Exercise Physiologist;Noah Tickle, BS, Exercise Physiologist;Jason Elnor RDN,LDN    Virtual Visit No    Medication changes reported     No    Fall or balance concerns reported    No    Warm-up and Cool-down Performed on first and last piece of equipment    Resistance Training Performed Yes    VAD Patient? No    PAD/SET Patient? No      Pain Assessment   Currently in Pain? No/denies             Social History   Tobacco Use  Smoking Status Never  Smokeless Tobacco Never    Goals Met:  Independence with exercise equipment Exercise tolerated well No report of concerns or symptoms today Strength training completed today  Goals Unmet:  Not Applicable  Comments: Pt able to follow exercise prescription today without complaint.  Will continue to monitor for progression.    Dr. Oneil Pinal is Medical Director for [facility] Cardiac Rehabilitation.  Dr. Fuad Aleskerov is Medical Director for [facility] Pulmonary Rehabilitation.

## 2024-09-12 ENCOUNTER — Other Ambulatory Visit: Admitting: Pharmacist

## 2024-09-12 NOTE — Progress Notes (Signed)
 09/12/2024 Name: Zali Kamaka MRN: [id number] DOB: 1956/01/30  Chief Complaint  Patient presents with   Diabetes    Machele Deihl is a 68 y.o. year old female who presented for a telephone visit.   They were referred to the pharmacist by their PCP for assistance in managing diabetes and medication access.    Subjective:   Medication Access/Adherence  Current Pharmacy:  [facility] [address] ([address]), [place] - [address] [place]) [place] [address] Phone: [phone] Fax: [phone]  [facility] - [place], [place] - [address] [place] [address] Phone: [phone] Fax: [phone]  [name] [address] [place] [address] Phone: [phone] Fax: [phone]   Patient reports affordability concerns with their medications: No  Patient reports access/transportation concerns to their pharmacy: No  Patient reports adherence concerns with their medications:  Yes     Diabetes:  Current medications:  [name]  at 23 units once a day  Novolog  / insulin  aspart 10 units prior to each meal. Add 2 units for high carbohydrate meals. Hold if blood glucose is 100 or less. Approved to get thru [employer] thru 10/17/2024 Farxiga  10mg  daily - getting from [facility] and Me patient assistance program - approved thru 10/17/2024.    Medications tried in the past: Ozempic  - stopped due to pancreatitis; metformin  - worsened IBS related diarrhea   Diet - admits that when she was recently visiting family she had hot dogs and french fries and a milkshake which caused her blood glucose in increase quit a bit.    Date of Download: 08/30/2024 to 09/12/2024 % Time CGM is active: 96%  Average Glucose: 193 mg/dL Glucose Management Indicator: 7.9%  Glucose Variability: 27.9% (goal <36%)  Time in Goal:  - Time in range 70-180: 46%  - Time in high range 181 to 250: 39% - Time in very  high range > 250: 15%  - Time below range: 0%   Blood glucose trends -  Highs overnight; two lows noted - patient thinks she might have taken Novolog  before and after her evening meal on 11/21 which caused low around 1am on 11/22       She is using her stationary bike a few times and is attending cardio rehab 2 times per week.  She has stated Wixela inhaler since our last visit. We were able to get her enrolled in [employer] program to help with cost of Wixela.  She reports that she feels that her breathing has improved since starting Wixela and she has been more active.  She has only been taking Wixela once a day because she is worried about thrush.   Using albuterol  as needed - has not needed in the last week.     Wt Readings from Last 3 Encounters:  09/06/24 200 lb 6.4 oz (90.9 kg)  08/29/24 202 lb (91.6 kg)  08/23/24 198 lb 8 oz (90 kg)     Objective:  Lab Results  Component Value Date   HGBA1C 8.9 (H) 07/03/2024   BP Readings from Last 3 Encounters:  09/06/24 115/60  08/29/24 (!) 106/53  08/10/24 137/83     Lab Results  Component Value Date   CREATININE 1.21 (H) 08/16/2024   BUN 22 08/16/2024   NA 138 08/16/2024   K 5.1 08/16/2024   CL 102 08/16/2024   CO2 19 (L) 08/16/2024    Lab Results  Component Value Date   CHOL 189 01/24/2024   HDL 55.40 01/24/2024   LDLCALC 110 (H) 01/24/2024   TRIG 119.0 01/24/2024   CHOLHDL 3 01/24/2024    Medications Reviewed Today     Reviewed by Carla Milling, RPH-CPP (Pharmacist) on 09/12/24 at 1042  Med List Status: <None>   Medication Order Taking? Sig Documenting Provider Last Dose Status Informant  albuterol  (VENTOLIN  HFA) 108 (90 Base) MCG/ACT inhaler [id number] Yes Inhale 2 puffs into the lungs every 6 (six) hours as needed for wheezing or shortness of breath. Almarie Waddell NOVAK, NP  Active   amiodarone  (PACERONE ) 200 MG tablet [id number] Yes Take 0.5 tablets (100 mg total) by mouth daily.  Patient taking  differently: Take 100 mg by mouth daily. Thru 10/08/2024   Gerard Frederick, NP  Active   amLODipine  (NORVASC ) 10 MG tablet [id number] Yes Take 1 tablet (10 mg total) by mouth daily. Gerard Frederick, NP  Active   aspirin  EC 325 MG tablet [id number] Yes Take 1 tablet (325 mg total) by mouth daily. Gold, Wayne E, PA-C  Active   benazepril  (LOTENSIN ) 40 MG tablet [id number] Yes Take 1 tablet (40 mg total) by mouth daily. Gerard Frederick, NP  Active   Blood Glucose Monitoring Suppl (CONTOUR NEXT USB MONITOR) w/Device KIT [id number]  1 kit by Does not apply route daily. To check blood sugar once daily. Vivienne Delon HERO, PA-C  Active Self  CareTouch Safety Lancets 26G MISC [id number]  1 Device by Does not apply route daily. To check blood sugar once daily. Vivienne Delon HERO, PA-C  Active Self  cetirizine  (ZYRTEC ) 10 MG tablet [id number]  Take 1 tablet by mouth once daily Drubel, [place]ita, PA-C  Active Self  Continuous Glucose Sensor (FREESTYLE LIBRE 3 PLUS SENSOR) MISC [id number] Yes USE AS DIRECTED. CHANGE SENSOR EVERY 15 DAYS. Domenica Harlene LABOR, MD  Active   dapagliflozin  propanediol (FARXIGA ) 10 MG TABS tablet [id number] Yes Take 1 tablet (10 mg total) by mouth daily. For [place]25 [place] Patient Assistance Program Scott, [place], PA-C  Active Self  ezetimibe  (ZETIA ) 10 MG tablet [id number] Yes Take 1 tablet (10 mg total) by mouth daily. Domenica Harlene LABOR, MD  Active   fluticasone -salmeterol GARETH INHUB) 100-50 MCG/ACT AEPB [id number] Yes Inhale 1 puff into the lungs 2 (two) times daily. Almarie Waddell NOVAK, NP  Active   glucose blood (CONTOUR NEXT TEST) test strip [id number] Yes To check blood sugar once daily. Vivienne Delon HERO, PA-C  Active Self  hydrochlorothiazide  (HYDRODIURIL ) 12.5 MG tablet [id number] Yes Take 1 tablet (12.5 mg total) by mouth daily. Gerard Frederick, NP  Active   insulin  aspart (NOVOLOG ) 100 UNIT/ML FlexPen [id number]  Inject 4 to 10 units up to 3 times a day with a meal. Cyndi Manuelita, PA-C  Active  Self  insulin  degludec ([name]  FLEXTOUCH) 100 UNIT/ML FlexTouch Pen [id number] Yes Inject 15 Units into the skin daily.  Patient taking differently: Inject 23 Units into the skin daily.   Brownie Nehme, RPH-CPP  Active   Insulin  Pen Needle (NOVOFINE PLUS) 32G X 4 MM MISC [id number] Yes To use with ozempic  injections Burnette, Jennifer M, PA-C  Active Self  metoprolol  tartrate (LOPRESSOR ) 25 MG tablet [id number] Yes Take 1.5 tablets (37.5 mg total) by mouth 2 (two) times daily. Gerard Frederick, NP  Active   montelukast  (SINGULAIR ) 10 MG tablet [id number] Yes TAKE 1 TABLET BY MOUTH AT BEDTIME Cyndi Manuelita, PA-C  Active Self  omeprazole  (PRILOSEC  OTC) 20 MG tablet [id number] Yes Take 1 tablet (  20 mg total) by mouth daily. Almarie Waddell NOVAK, NP  Active               Assessment/Plan:  Diabetes: blood glucose and GMI over the last 2 weeks has not been at goal but had improved - Continue [name]  to 23 units once a day.  -  Continue Novolog  / insulin  aspart 10 units prior to each meal. She will add 2 additional unit if having a meal that is high in carbohydrates or sweets- like cinnamon rolls, cereal, pasta, rice, potatoes or bread. Hold if blood glucose is 100 or less. Discussed importance of taking with each meal to prevent blood glucose excursions.  - Ordered Novolog  insulin  aspart from [employer] patient assistance program today.  - Continue Farxiga  10mg  daily.  - Continue to use Libre 3 sensors to check blood glucose continuously  Asthma:  - Continue to use Wixela - recommended she try to use every 12 hours and rinse after each use to prevent thrush.  - Continue to use albuterol  as needed for wheezing / SOB    Follow up planned 2 weeks.   [name], PharmD Clinical Pharmacist [facility] Primary Care  Population Health [phone]

## 2024-09-18 ENCOUNTER — Encounter

## 2024-09-18 DIAGNOSIS — Z952 Presence of prosthetic heart valve: Secondary | ICD-10-CM | POA: Insufficient documentation

## 2024-09-18 NOTE — Progress Notes (Signed)
 Daily Session Note  Patient Details  Name: Shari Lee MRN: [id number] Date of Birth: Jun 22, 1956 Referring Provider:   Flowsheet Row Cardiac Rehab from 08/23/2024 in [facility] Cardiac and Pulmonary Rehab  Referring Provider Darliss Rogue, MD    Encounter Date: 09/18/2024  Check In:  Session Check In - 09/18/24 1055       Check-In   Supervising physician immediately available to respond to emergencies See telemetry face sheet for immediately available ER MD    Location [facility]-Cardiac & Pulmonary Rehab    Staff Present Burnard Davenport RN,BSN,MPA;Maxon Conetta BS, Exercise Physiologist;Noah Tickle, BS, Exercise Physiologist;Jason Elnor RDN,LDN;Margaret Best, MS, Exercise Physiologist    Virtual Visit No    Medication changes reported     No    Fall or balance concerns reported    No    Warm-up and Cool-down Performed on first and last piece of equipment    Resistance Training Performed Yes    VAD Patient? No    PAD/SET Patient? No      Pain Assessment   Currently in Pain? No/denies             Social History   Tobacco Use  Smoking Status Never  Smokeless Tobacco Never    Goals Met:  Independence with exercise equipment Exercise tolerated well No report of concerns or symptoms today Strength training completed today  Goals Unmet:  Not Applicable  Comments: Pt able to follow exercise prescription today without complaint.  Will continue to monitor for progression.    Dr. Oneil Pinal is Medical Director for [facility] Cardiac Rehabilitation.  Dr. Fuad Aleskerov is Medical Director for [facility] Pulmonary Rehabilitation.

## 2024-09-20 ENCOUNTER — Encounter: Admitting: Emergency Medicine

## 2024-09-20 DIAGNOSIS — Z952 Presence of prosthetic heart valve: Secondary | ICD-10-CM | POA: Diagnosis not present

## 2024-09-20 NOTE — Progress Notes (Signed)
 Daily Session Note  Patient Details  Name: Koreen Lizaola MRN: [id number] Date of Birth: 1955-12-24 Referring Provider:   Flowsheet Row Cardiac Rehab from 08/23/2024 in [facility] Cardiac and Pulmonary Rehab  Referring Provider Darliss Rogue, MD    Encounter Date: 09/20/2024  Check In:  Session Check In - 09/20/24 1157       Check-In   Supervising physician immediately available to respond to emergencies See telemetry face sheet for immediately available ER MD    Location [facility]-Cardiac & Pulmonary Rehab    Staff Present Leita Franks RN,BSN;Joseph Rolinda RCP,RRT,BSRT;Jason Elnor RDN,LDN;Laureen Delores, BS, RRT, CPFT    Virtual Visit No    Medication changes reported     No    Fall or balance concerns reported    No    Warm-up and Cool-down Performed on first and last piece of equipment    Resistance Training Performed Yes    VAD Patient? No    PAD/SET Patient? No      Pain Assessment   Currently in Pain? No/denies             Social History   Tobacco Use  Smoking Status Never  Smokeless Tobacco Never    Goals Met:  Independence with exercise equipment Exercise tolerated well No report of concerns or symptoms today Strength training completed today  Goals Unmet:  Not Applicable  Comments: Pt able to follow exercise prescription today without complaint.  Will continue to monitor for progression.    Dr. Oneil Pinal is Medical Director for [facility] Cardiac Rehabilitation.  Dr. Fuad Aleskerov is Medical Director for [facility] Pulmonary Rehabilitation.

## 2024-09-25 ENCOUNTER — Encounter

## 2024-09-25 DIAGNOSIS — Z952 Presence of prosthetic heart valve: Secondary | ICD-10-CM | POA: Diagnosis not present

## 2024-09-25 NOTE — Progress Notes (Signed)
 Daily Session Note  Patient Details  Name: Floy Angert MRN: [id number] Date of Birth: 1956-05-15 Referring Provider:   Flowsheet Row Cardiac Rehab from 08/23/2024 in [facility] Cardiac and Pulmonary Rehab  Referring Provider Darliss Rogue, MD    Encounter Date: 09/25/2024  Check In:  Session Check In - 09/25/24 1053       Check-In   Supervising physician immediately available to respond to emergencies See telemetry face sheet for immediately available ER MD    Location [facility]-Cardiac & Pulmonary Rehab    Staff Present Burnard Davenport RN,BSN,MPA;Meredith Tressa RN,BSN;Maxon Burnell BS, Exercise Physiologist;Noah Tickle, BS, Exercise Physiologist    Virtual Visit No    Medication changes reported     No    Fall or balance concerns reported    No    Warm-up and Cool-down Performed on first and last piece of equipment    Resistance Training Performed Yes    VAD Patient? No    PAD/SET Patient? No      Pain Assessment   Currently in Pain? No/denies             Social History   Tobacco Use  Smoking Status Never  Smokeless Tobacco Never    Goals Met:  Independence with exercise equipment Exercise tolerated well No report of concerns or symptoms today Strength training completed today  Goals Unmet:  Not Applicable  Comments: Pt able to follow exercise prescription today without complaint.  Will continue to monitor for progression.    Dr. Oneil Pinal is Medical Director for [facility] Cardiac Rehabilitation.  Dr. Fuad Aleskerov is Medical Director for [facility] Pulmonary Rehabilitation.

## 2024-09-26 ENCOUNTER — Other Ambulatory Visit (INDEPENDENT_AMBULATORY_CARE_PROVIDER_SITE_OTHER): Admitting: Pharmacist

## 2024-09-26 DIAGNOSIS — Z794 Long term (current) use of insulin: Secondary | ICD-10-CM

## 2024-09-26 DIAGNOSIS — E1165 Type 2 diabetes mellitus with hyperglycemia: Secondary | ICD-10-CM

## 2024-09-26 NOTE — Progress Notes (Signed)
 09/26/2024 Name: Shari Lee MRN: [id number] DOB: May 01, 1956  Chief Complaint  Patient presents with   Diabetes    Shari Lee is a 68 y.o. year old female who presented for a telephone visit.   They were referred to the pharmacist by their PCP for assistance in managing diabetes and medication access.    Subjective:   Medication Access/Adherence  Current Pharmacy:  [facility] [address] ([address]), [place] - [address] [place]) [place] [address] Phone: [phone] Fax: [phone]  [facility] [place], [place] - [address] [place] [address] Phone: [phone] Fax: [phone]  [name] [address] [place] [address] Phone: [phone] Fax: [phone]   Patient reports affordability concerns with their medications: No  Patient reports access/transportation concerns to their pharmacy: No  Patient reports adherence concerns with their medications:  Yes     Diabetes:  Current medications:  [name]  at 23 units once a day  Novolog  / insulin  aspart 10 units prior to each meal. Patient reports sometimes she forgets to take before a meal but takes either during or right after meals. Add 2 units for high carbohydrate meals. Hold if blood glucose is 100 or less. Approved to get thru [employer]o Nordisk thru 10/17/2024 Farxiga  10mg  daily - getting from [facility] patient assistance program - approved thru 10/17/2024.    Medications tried in the past: Ozempic  - stopped due to pancreatitis; metformin  - worsened IBS related diarrhea   Diet  Eats out quite a bit Breakfast today was BLT, hash browns and unsweet tea - blood glucose rose to 350 after breakfast.  Yesterday blood glucose also rose after breakfast to 288 - she had ham biscuit, hash browns and unsweet tea  Date of Download: 09/13/2024 to 09/26/2024 % Time CGM is active: 96%  Average Glucose: 190  mg/dL Glucose Management Indicator: 7.9%  Glucose Variability: 28% (goal <36%)  Time in Goal:  - Time in range 70-180: 47%  - Time in high range 181 to 250: 38% - Time in very high range > 250: 15%  - Time below range: 0%   Blood glucose trends -  Highs mostly after meals / breakfast        She is using her stationary bike a few times and is attending cardio rehab 2 times per week.  She has continued to use the Wixela inhaler. She is enrolled in [employer] program to help with cost of [place].   Using albuterol  as needed - has not needed in the last 2 weeks.    Wt Readings from Last 3 Encounters:  09/06/24 200 lb 6.4 oz (90.9 kg)  08/29/24 202 lb (91.6 kg)  08/23/24 198 lb 8 oz (90 kg)     Objective:  Lab Results  Component Value Date   HGBA1C 8.9 (H) 07/03/2024   BP Readings from Last 3 Encounters:  09/06/24 115/60  08/29/24 (!) 106/53  08/10/24 137/83     Lab Results  Component Value Date   CREATININE 1.21 (H) 08/16/2024   BUN 22 08/16/2024   NA 138 08/16/2024   K 5.1 08/16/2024   CL 102 08/16/2024   CO2 19 (L) 08/16/2024    Lab Results  Component Value Date   CHOL 189 01/24/2024   HDL 55.40 01/24/2024   LDLCALC 110 (H) 01/24/2024   TRIG 119.0 01/24/2024   CHOLHDL 3 01/24/2024  Medications Reviewed Today     Reviewed by Carla Milling, RPH-CPP (Pharmacist) on 09/26/24 at 1124  Med List Status: <None>   Medication Order Taking? Sig Documenting Provider Last Dose Status Informant  albuterol  (VENTOLIN  HFA) 108 (90 Base) MCG/ACT inhaler [id number] Yes Inhale 2 puffs into the lungs every 6 (six) hours as needed for wheezing or shortness of breath. Almarie Waddell NOVAK, NP  Active   amiodarone  (PACERONE ) 200 MG tablet [id number]  Take 0.5 tablets (100 mg total) by mouth daily.  Patient taking differently: Take 100 mg by mouth daily. Thru 10/08/2024   Gerard Frederick, NP  Active   amLODipine  (NORVASC ) 10 MG tablet [id number] Yes Take 1 tablet (10 mg total) by  mouth daily. Gerard Frederick, NP  Active   aspirin  EC 325 MG tablet [id number] Yes Take 1 tablet (325 mg total) by mouth daily. Gold, Wayne E, PA-C  Active   benazepril  (LOTENSIN ) 40 MG tablet [id number] Yes Take 1 tablet (40 mg total) by mouth daily. Gerard Frederick, NP  Active   Blood Glucose Monitoring Suppl (CONTOUR NEXT USB MONITOR) w/Device KIT [id number]  1 kit by Does not apply route daily. To check blood sugar once daily. Vivienne Delon HERO, PA-C  Active Self  CareTouch Safety Lancets 26G MISC [id number]  1 Device by Does not apply route daily. To check blood sugar once daily. Vivienne Delon HERO, PA-C  Active Self  cetirizine  (ZYRTEC ) 10 MG tablet [id number]  Take 1 tablet by mouth once daily Drubel, Manuelita, PA-C  Active Self  Continuous Glucose Sensor (FREESTYLE LIBRE 3 PLUS SENSOR) MISC [id number] Yes USE AS DIRECTED. CHANGE SENSOR EVERY 15 DAYS. Domenica Harlene LABOR, MD  Active   dapagliflozin  propanediol (FARXIGA ) 10 MG TABS tablet [id number] Yes Take 1 tablet (10 mg total) by mouth daily. For 2025 [place] Patient Assistance Program Cyndi Manuelita, PA-C  Active Self  ezetimibe  (ZETIA ) 10 MG tablet [id number] Yes Take 1 tablet (10 mg total) by mouth daily. Domenica Harlene LABOR, MD  Active   fluticasone -salmeterol GARETH INHUB) 100-50 MCG/ACT AEPB [id number] Yes Inhale 1 puff into the lungs 2 (two) times daily. Almarie Waddell NOVAK, NP  Active   glucose blood (CONTOUR NEXT TEST) test strip [id number]  To check blood sugar once daily. Vivienne Delon HERO, PA-C  Active Self  hydrochlorothiazide  (HYDRODIURIL ) 12.5 MG tablet [id number] Yes Take 1 tablet (12.5 mg total) by mouth daily. Gerard Frederick, NP  Active   insulin  aspart (NOVOLOG ) 100 UNIT/ML FlexPen [id number] Yes Inject 4 to 10 units up to 3 times a day with a meal. Cyndi Manuelita, PA-C  Active Self  insulin  degludec ([name]  FLEXTOUCH) 100 UNIT/ML FlexTouch Pen [id number] Yes Inject 15 Units into the skin daily.  Patient taking differently: Inject 23 Units  into the skin daily.   Carla Milling, RPH-CPP  Active   Insulin  Pen Needle (NOVOFINE PLUS) 32G X 4 MM MISC [id number]  To use with ozempic  injections Burnette, Jennifer M, PA-C  Active Self  metoprolol  tartrate (LOPRESSOR ) 25 MG tablet [id number] Yes Take 1.5 tablets (37.5 mg total) by mouth 2 (two) times daily. Gerard Frederick, NP  Active   montelukast  (SINGULAIR ) 10 MG tablet [id number] Yes TAKE 1 TABLET BY MOUTH AT BEDTIME Cyndi Manuelita, PA-C  Active Self  omeprazole  (PRILOSEC  OTC) 20 MG tablet [id number] Yes Take 1 tablet (20 mg total) by mouth daily. Almarie Waddell NOVAK, NP  Active               Assessment/Plan:  Diabetes: blood glucose and  GMI over the last 2 weeks has not been at goal but had improved - Continue [name]  to 23 units once a day.  -  Continue Novolog  / insulin  aspart 10 units prior to each meal. She will add 2 additional unit if having a meal that is high in carbohydrates or sweets- like cinnamon rolls, cereal, pasta, rice, potatoes or bread. (Recommended if she has hash browns with breakfast she should add the 2 extra units) Hold if blood glucose is 100 or less. Discussed importance of taking with each meal to prevent blood glucose excursions.  - Reminded that its best to take Novolog  prior to meal to get best effect and prevent prandial hyperglycemia.  - Discussed limiting intake of high carbohydrate foods - bread, biscuits, potatoes.  - Reminded patient she has Novolog  from patient assistance program to pick up at our office. Also reminded her to bring in 2026 patient assistance program application ASAP.  - Continue Farxiga  10mg  daily.  - Continue to use Libre 3 sensors to check blood glucose continuously  Asthma:  - Continue to use Wixela - recommended she try to use every 12 hours and rinse after each use to prevent thrush.  - Continue to use albuterol  as needed for wheezing / SOB   Follow up planned 2 to 3 weeks. Reminded to make follow up with PCP for Feb 2026 - she  will make appt when she comes to pick up her Novolog  from our office. She has to check on other appointments to avoid conflicts.   [name], PharmD Clinical Pharmacist [facility] Primary Care  Population Health [phone]

## 2024-09-27 ENCOUNTER — Encounter: Admitting: Emergency Medicine

## 2024-09-27 DIAGNOSIS — Z952 Presence of prosthetic heart valve: Secondary | ICD-10-CM

## 2024-09-27 NOTE — Progress Notes (Signed)
 Daily Session Note  Patient Details  Name: Shari Lee MRN: [id number] Date of Birth: 1956/08/23 Referring Provider:   Flowsheet Row Cardiac Rehab from 08/23/2024 in [facility] Cardiac and Pulmonary Rehab  Referring Provider Darliss Rogue, MD    Encounter Date: 09/27/2024  Check In:  Session Check In - 09/27/24 1113       Check-In   Supervising physician immediately available to respond to emergencies See telemetry face sheet for immediately available ER MD    Location [facility]-Cardiac & Pulmonary Rehab    Staff Present Leita Franks RN,BSN;Joseph Rolinda RCP,RRT,BSRT;Kristen Coble RN,BC,MSN;Meredith Tressa RN,BSN    Virtual Visit No    Medication changes reported     No    Fall or balance concerns reported    No    Warm-up and Cool-down Performed on first and last piece of equipment    Resistance Training Performed Yes    VAD Patient? No    PAD/SET Patient? No      Pain Assessment   Currently in Pain? No/denies             Tobacco Use History[1]  Goals Met:  Independence with exercise equipment Exercise tolerated well No report of concerns or symptoms today Strength training completed today  Goals Unmet:  Not Applicable  Comments: Pt able to follow exercise prescription today without complaint.  Will continue to monitor for progression.    Dr. Oneil Pinal is Medical Director for [facility] Cardiac Rehabilitation.  Dr. Fuad Aleskerov is Medical Director for [facility] Pulmonary Rehabilitation.    [1]  Social History Tobacco Use  Smoking Status Never  Smokeless Tobacco Never

## 2024-10-02 ENCOUNTER — Encounter

## 2024-10-02 DIAGNOSIS — Z952 Presence of prosthetic heart valve: Secondary | ICD-10-CM | POA: Diagnosis not present

## 2024-10-02 NOTE — Progress Notes (Signed)
 Daily Session Note  Patient Details  Name: Shari Lee MRN: [id number] Date of Birth: 11/27/1955 Referring Provider:   Flowsheet Row Cardiac Rehab from 08/23/2024 in [facility] Cardiac and Pulmonary Rehab  Referring Provider Darliss Rogue, MD    Encounter Date: 10/02/2024  Check In:  Session Check In - 10/02/24 1113       Check-In   Supervising physician immediately available to respond to emergencies See telemetry face sheet for immediately available ER MD    Location [facility]-Cardiac & Pulmonary Rehab    Staff Present Burnard Davenport RN,BSN,MPA;Maxon Conetta BS, Exercise Physiologist;Meredith Tressa RN,BSN;Jason Elnor [facility]    Virtual Visit No    Medication changes reported     No    Fall or balance concerns reported    No    Warm-up and Cool-down Performed on first and last piece of equipment    Resistance Training Performed Yes    VAD Patient? No    PAD/SET Patient? No      Pain Assessment   Currently in Pain? No/denies             Tobacco Use History[1]  Goals Met:  Independence with exercise equipment Exercise tolerated well No report of concerns or symptoms today Strength training completed today  Goals Unmet:  Not Applicable  Comments: Pt able to follow exercise prescription today without complaint.  Will continue to monitor for progression.    Dr. Oneil Pinal is Medical Director for [facility] Cardiac Rehabilitation.  Dr. Fuad Aleskerov is Medical Director for [facility] Pulmonary Rehabilitation.    [1]  Social History Tobacco Use  Smoking Status Never  Smokeless Tobacco Never

## 2024-10-03 DIAGNOSIS — Z952 Presence of prosthetic heart valve: Secondary | ICD-10-CM

## 2024-10-03 NOTE — Progress Notes (Signed)
 Cardiac Individual Treatment Plan  Patient Details  Name: Shari Lee MRN: [id number] Date of Birth: 08-17-1956 Referring Provider:   Flowsheet Row Cardiac Rehab from 08/23/2024 in [facility] Cardiac and Pulmonary Rehab  Referring Provider Darliss Rogue, MD    Initial Encounter Date:  Flowsheet Row Cardiac Rehab from 08/23/2024 in [facility] Cardiac and Pulmonary Rehab  Date 08/23/24    Visit Diagnosis: S/P AVR  Patient's Home Medications on Admission: Current Medications[1]  Past Medical History: Past Medical History:  Diagnosis Date   Asthma    mild   Diarrhea    N/V recently   Dyspnea    GERD (gastroesophageal reflux disease)    Headache    Heart murmur    developed in last couple years/ no issues   HOH (hard of hearing)    wears aids   Hypertension    Motion sickness    car   Sleep apnea    Type 2 diabetes mellitus with hyperglycemia (HCC)     Tobacco Use: Tobacco Use History[2]  Labs: Review Flowsheet  More data exists      Latest Ref Rng & Units 10/20/2023 01/24/2024 03/26/2024 07/03/2024 07/05/2024  Labs for ITP Cardiac and Pulmonary Rehab  Cholestrol 0 - 200 mg/dL 773  810  - - -  LDL (calc) 0 - 99 mg/dL 864  889  - - -  HDL-C >39.00 mg/dL 22.49  44.59  - - -  Trlycerides 0.0 - 149.0 mg/dL 33.9  880.9  - - -  Hemoglobin A1c 4.8 - 5.6 % 9.7  9.0  - 8.9  -  PH, Arterial 7.35 - 7.45 - - 7.341  - 7.271  7.290  7.266  7.185  7.236  7.354  7.391  7.415  7.368  7.396  7.225   PCO2 arterial 32 - 48 mmHg - - 42.5  - 42.0  40.4  46.9  54.8  41.0  34.9  33.8  34.2  41.3  36.5  58.7   Bicarbonate 20.0 - 28.0 mmol/L - - 25.0  23.0  - 19.3  19.4  21.4  20.8  17.4  19.5  20.5  21.9  23.8  23.9  22.4  24.3   TCO2 22 - 32 mmol/L - - 26  24  - 21  21  23  22  19  21  22  24  23  25  24  25  23  21  26  24    Acid-base deficit 0.0 - 2.0 mmol/L - - 2.0  3.0  - 7.0  7.0  5.0  7.0  9.0  6.0  4.0  2.0  1.0  1.0  2.0  4.0   O2 Saturation % - - 73  98  - 91  94  94  93  97  100  100  100   100  79  100  89     Details       Multiple values from one day are sorted in reverse-chronological order          Exercise Target Goals: Exercise Program Goal: Individual exercise prescription set using results from initial 6 min walk test and THRR while considering  patients activity barriers and safety.   Exercise Prescription Goal: Initial exercise prescription builds to 30-45 minutes a day of aerobic activity, 2-3 days per week.  Home exercise guidelines will be given to patient during program as part of exercise prescription that the participant will acknowledge.   Education: Aerobic Exercise: -  Group verbal and visual presentation on the components of exercise prescription. Introduces F.I.T.T principle from ACSM for exercise prescriptions.  Reviews F.I.T.T. principles of aerobic exercise including progression. Written material provided at class time.   Education: Resistance Exercise: - Group verbal and visual presentation on the components of exercise prescription. Introduces F.I.T.T principle from ACSM for exercise prescriptions  Reviews F.I.T.T. principles of resistance exercise including progression. Written material provided at class time.    Education: Exercise & Equipment Safety: - Individual verbal instruction and demonstration of equipment use and safety with use of the equipment. Flowsheet Row Cardiac Rehab from 08/23/2024 in [facility] Cardiac and Pulmonary Rehab  Date 08/23/24  Educator MB  Instruction Review Code 1- Verbalizes Understanding    Education: Exercise Physiology & General Exercise Guidelines: - Group verbal and written instruction with models to review the exercise physiology of the cardiovascular system and associated critical values. Provides general exercise guidelines with specific guidelines to those with heart or lung disease. Written material provided at class time. Flowsheet Row Cardiac Rehab from 08/23/2024 in [facility] Cardiac and Pulmonary Rehab   Education need identified 08/23/24    Education: Flexibility, Balance, Mind/Body Relaxation: - Group verbal and visual presentation with interactive activity on the components of exercise prescription. Introduces F.I.T.T principle from ACSM for exercise prescriptions. Reviews F.I.T.T. principles of flexibility and balance exercise training including progression. Also discusses the mind body connection.  Reviews various relaxation techniques to help reduce and manage stress (i.e. Deep breathing, progressive muscle relaxation, and visualization). Balance handout provided to take home. Written material provided at class time.   Activity Barriers & Risk Stratification:  Activity Barriers & Cardiac Risk Stratification - 08/23/24 0954       Activity Barriers & Cardiac Risk Stratification   Activity Barriers Joint Problems;Shortness of Breath;Other (comment)    Comments Bilateral knee and hip pain, L foot plantar fasciitis, 10 lb lifting restriction for 3 months, hearing aid    Cardiac Risk Stratification Moderate          6 Minute Walk:  6 Minute Walk     Row Name 08/23/24 0953         6 Minute Walk   Phase Initial     Distance 1135 feet     Walk Time 6 minutes     # of Rest Breaks 0     MPH 2.15     METS 2.14     RPE 9     Perceived Dyspnea  2     VO2 Peak 7.48     Symptoms No     Resting HR 72 bpm     Resting BP 110/64     Resting Oxygen Saturation  100 %     Exercise Oxygen Saturation  during 6 min walk 98 %     Max Ex. HR 98 bpm     Max Ex. BP 118/60     2 Minute Post BP 104/60        Oxygen Initial Assessment:   Oxygen Re-Evaluation:   Oxygen Discharge (Final Oxygen Re-Evaluation):   Initial Exercise Prescription:  Initial Exercise Prescription - 08/23/24 0900       Date of Initial Exercise RX and Referring Provider   Date 08/23/24    Referring Provider Darliss Rogue, MD      Oxygen   Maintain Oxygen Saturation 88% or higher      NuStep    Level 2    SPM 80    Minutes 15  METs 2.14      REL-XR   Level 1    Speed 50    Minutes 15    METs 2.14      T5 Nustep   Level 2    SPM 80    Minutes 15    METs 2.14      Track   Laps 30    Minutes 15    METs 2.63      Prescription Details   Frequency (times per week) 2    Duration Progress to 30 minutes of continuous aerobic without signs/symptoms of physical distress      Intensity   THRR 40-80% of Max Heartrate 104-136    Ratings of Perceived Exertion 11-13    Perceived Dyspnea 0-4      Progression   Progression Continue to progress workloads to maintain intensity without signs/symptoms of physical distress.      Resistance Training   Training Prescription Yes    Weight 4 lb    Reps 10-15          Perform Capillary Blood Glucose checks as needed.  Exercise Prescription Changes:   Exercise Prescription Changes     Row Name 08/23/24 0900 09/06/24 1100 09/20/24 1100         Response to Exercise   Blood Pressure (Admit) 110/64 126/64 114/60     Blood Pressure (Exercise) 118/60 122/64 134/62     Blood Pressure (Exit) 104/60 102/60 112/62     Heart Rate (Admit) 72 bpm 72 bpm 79 bpm     Heart Rate (Exercise) 98 bpm 102 bpm 117 bpm     Heart Rate (Exit) 78 bpm 70 bpm 70 bpm     Oxygen Saturation (Admit) 100 % -- --     Oxygen Saturation (Exercise) 98 % -- --     Oxygen Saturation (Exit) 99 % -- --     Rating of Perceived Exertion (Exercise) 9 13 13      Perceived Dyspnea (Exercise) 2 -- --     Symptoms none none none     Comments results 1st week of exercise sessions --     Duration -- Progress to 30 minutes of  aerobic without signs/symptoms of physical distress Continue with 30 min of aerobic exercise without signs/symptoms of physical distress.     Intensity -- THRR unchanged THRR unchanged       Progression   Progression -- Continue to progress workloads to maintain intensity without signs/symptoms of physical distress. Continue to progress  workloads to maintain intensity without signs/symptoms of physical distress.     Average METs 2.14 2.22 2.49       Resistance Training   Training Prescription -- Yes Yes     Weight -- 4 lb 4 lb     Reps -- 10-15 10-15       Interval Training   Interval Training -- No No       NuStep   Level -- 2 4     Minutes -- 15 15     METs -- 2.3 2.6       REL-XR   Level -- 1 --     Minutes -- 15 --     METs -- 3.5 --       T5 Nustep   Level -- -- 3     Minutes -- -- 15     METs -- -- 1.7       Track   Laps -- 10 35  Minutes -- 15 15     METs -- 1.54 2.9       Oxygen   Maintain Oxygen Saturation -- 88% or higher 88% or higher        Exercise Comments:   Exercise Goals and Review:   Exercise Goals     Row Name 08/23/24 0957             Exercise Goals   Increase Physical Activity Yes       Intervention Provide advice, education, support and counseling about physical activity/exercise needs.;Develop an individualized exercise prescription for aerobic and resistive training based on initial evaluation findings, risk stratification, comorbidities and participant's personal goals.       Expected Outcomes Short Term: Attend rehab on a regular basis to increase amount of physical activity.;Long Term: Add in home exercise to make exercise part of routine and to increase amount of physical activity.;Long Term: Exercising regularly at least 3-5 days a week.       Increase Strength and Stamina Yes       Intervention Provide advice, education, support and counseling about physical activity/exercise needs.;Develop an individualized exercise prescription for aerobic and resistive training based on initial evaluation findings, risk stratification, comorbidities and participant's personal goals.       Expected Outcomes Short Term: Increase workloads from initial exercise prescription for resistance, speed, and METs.;Short Term: Perform resistance training exercises routinely during rehab  and add in resistance training at home;Long Term: Improve cardiorespiratory fitness, muscular endurance and strength as measured by increased METs and functional capacity ( )       Able to understand and use rate of perceived exertion (RPE) scale Yes       Intervention Provide education and explanation on how to use RPE scale       Expected Outcomes Short Term: Able to use RPE daily in rehab to express subjective intensity level;Long Term:  Able to use RPE to guide intensity level when exercising independently       Able to understand and use Dyspnea scale Yes       Intervention Provide education and explanation on how to use Dyspnea scale       Expected Outcomes Short Term: Able to use Dyspnea scale daily in rehab to express subjective sense of shortness of breath during exertion;Long Term: Able to use Dyspnea scale to guide intensity level when exercising independently       Knowledge and understanding of Target Heart Rate Range (THRR) Yes       Intervention Provide education and explanation of THRR including how the numbers were predicted and where they are located for reference       Expected Outcomes Short Term: Able to state/look up THRR;Short Term: Able to use daily as guideline for intensity in rehab;Long Term: Able to use THRR to govern intensity when exercising independently       Able to check pulse independently Yes       Intervention Provide education and demonstration on how to check pulse in carotid and radial arteries.;Review the importance of being able to check your own pulse for safety during independent exercise       Expected Outcomes Short Term: Able to explain why pulse checking is important during independent exercise;Long Term: Able to check pulse independently and accurately       Understanding of Exercise Prescription Yes       Intervention Provide education, explanation, and written materials on patient's individual exercise prescription  Expected Outcomes Short  Term: Able to explain program exercise prescription;Long Term: Able to explain home exercise prescription to exercise independently          Exercise Goals Re-Evaluation :  Exercise Goals Re-Evaluation     Row Name 08/28/24 1112 09/06/24 1116 09/20/24 1121         Exercise Goal Re-Evaluation   Exercise Goals Review Increase Physical Activity;Able to understand and use rate of perceived exertion (RPE) scale;Knowledge and understanding of Target Heart Rate Range (THRR);Understanding of Exercise Prescription;Able to check pulse independently;Able to understand and use Dyspnea scale;Increase Strength and Stamina Increase Physical Activity;Understanding of Exercise Prescription;Increase Strength and Stamina Increase Physical Activity;Understanding of Exercise Prescription;Increase Strength and Stamina     Comments Reviewed RPE and dyspnea scale, THR and program prescription with pt today.  Pt voiced understanding and was given a copy of goals to take home. Marietta is off to a good start in the program. She completed her first week of exercise in this review. She was able to work at level 2 on the T4 nustep and level 1 on the XR. She walked 10 laps on the track. We will continue to monitor her progress in the program. Miia is doing well in rehab. She recently increased to 35 laps on the track. She also improved to level 4 on the T4 nustep and level 3 on the T5 nustep. We will continue to monitor her progress in the program.     Expected Outcomes Short: Use RPE daily to regulate intensity. Long: Follow program prescription in THR. Short: Continue to follow current exercise prescription. Long: Continue exercise to improve strength and stamina. Short: Continue to progressively increase workloads. Long: Continue exercise to improve strength and stamina.        Discharge Exercise Prescription (Final Exercise Prescription Changes):  Exercise Prescription Changes - 09/20/24 1100       Response to Exercise    Blood Pressure (Admit) 114/60    Blood Pressure (Exercise) 134/62    Blood Pressure (Exit) 112/62    Heart Rate (Admit) 79 bpm    Heart Rate (Exercise) 117 bpm    Heart Rate (Exit) 70 bpm    Rating of Perceived Exertion (Exercise) 13    Symptoms none    Duration Continue with 30 min of aerobic exercise without signs/symptoms of physical distress.    Intensity THRR unchanged      Progression   Progression Continue to progress workloads to maintain intensity without signs/symptoms of physical distress.    Average METs 2.49      Resistance Training   Training Prescription Yes    Weight 4 lb    Reps 10-15      Interval Training   Interval Training No      NuStep   Level 4    Minutes 15    METs 2.6      T5 Nustep   Level 3    Minutes 15    METs 1.7      Track   Laps 35    Minutes 15    METs 2.9      Oxygen   Maintain Oxygen Saturation 88% or higher          Nutrition:  Target Goals: Understanding of nutrition guidelines, daily intake of sodium 1500mg , cholesterol 200mg , calories 30% from fat and 7% or less from saturated fats, daily to have 5 or more servings of fruits and vegetables.  Education: Nutrition 1 -Group instruction provided by verbal,  written material, interactive activities, discussions, models, and posters to present general guidelines for heart healthy nutrition including macronutrients, label reading, and promoting whole foods over processed counterparts. Education serves as pensions consultant of discussion of heart healthy eating for all. Written material provided at class time.    Education: Nutrition 2 -Group instruction provided by verbal, written material, interactive activities, discussions, models, and posters to present general guidelines for heart healthy nutrition including sodium, cholesterol, and saturated fat. Providing guidance of habit forming to improve blood pressure, cholesterol, and body weight. Written material provided at class  time.     Biometrics:  Pre Biometrics - 08/23/24 0958       Pre Biometrics   Height 5' 2.2 (1.58 m)    Weight 198 lb 8 oz (90 kg)    Waist Circumference 41.5 inches    Hip Circumference 47 inches    Waist to Hip Ratio 0.88 %    BMI (Calculated) 36.07    Single Leg Stand 14.9 seconds           Nutrition Therapy Plan and Nutrition Goals:  Nutrition Therapy & Goals - 08/23/24 0958       Nutrition Therapy   RD appointment deferred Yes      Personal Nutrition Goals   Nutrition Goal RD appointment deferred at this time      Intervention Plan   Intervention Prescribe, educate and counsel regarding individualized specific dietary modifications aiming towards targeted core components such as weight, hypertension, lipid management, diabetes, heart failure and other comorbidities.    Expected Outcomes Short Term Goal: Understand basic principles of dietary content, such as calories, fat, sodium, cholesterol and nutrients.          Nutrition Assessments:  MEDIFICTS Score Key: >=70 Need to make dietary changes  40-70 Heart Healthy Diet <= 40 Therapeutic Level Cholesterol Diet  Flowsheet Row Cardiac Rehab from 08/20/2024 in [facility] Cardiac and Pulmonary Rehab  Picture Your Plate Total Score on Admission 47   Picture Your Plate Scores: <59 Unhealthy dietary pattern with much room for improvement. 41-50 Dietary pattern unlikely to meet recommendations for good health and room for improvement. 51-60 More healthful dietary pattern, with some room for improvement.  >60 Healthy dietary pattern, although there may be some specific behaviors that could be improved.    Nutrition Goals Re-Evaluation:  Nutrition Goals Re-Evaluation     Row Name 09/27/24 1130             Goals   Nutrition Goal RD appointment deferred at this time          Nutrition Goals Discharge (Final Nutrition Goals Re-Evaluation):  Nutrition Goals Re-Evaluation - 09/27/24 1130       Goals    Nutrition Goal RD appointment deferred at this time          Psychosocial: Target Goals: Acknowledge presence or absence of significant depression and/or stress, maximize coping skills, provide positive support system. Participant is able to verbalize types and ability to use techniques and skills needed for reducing stress and depression.   Education: Stress, Anxiety, and Depression - Group verbal and visual presentation to define topics covered.  Reviews how body is impacted by stress, anxiety, and depression.  Also discusses healthy ways to reduce stress and to treat/manage anxiety and depression. Written material provided at class time.   Education: Sleep Hygiene -Provides group verbal and written instruction about how sleep can affect your health.  Define sleep hygiene, discuss sleep cycles and impact of sleep  habits. Review good sleep hygiene tips.   Initial Review & Psychosocial Screening:  Initial Psych Review & Screening - 08/20/24 1343       Initial Review   Current issues with Current Anxiety/Panic;Current Sleep Concerns      Family Dynamics   Good Support System? Yes      Barriers   Psychosocial barriers to participate in program There are no identifiable barriers or psychosocial needs.;The patient should benefit from training in stress management and relaxation.      Screening Interventions   Interventions Encouraged to exercise;Provide feedback about the scores to participant;To provide support and resources with identified psychosocial needs    Expected Outcomes Short Term goal: Utilizing psychosocial counselor, staff and physician to assist with identification of specific Stressors or current issues interfering with healing process. Setting desired goal for each stressor or current issue identified.;Long Term Goal: Stressors or current issues are controlled or eliminated.;Short Term goal: Identification and review with participant of any Quality of Life or Depression  concerns found by scoring the questionnaire.;Long Term goal: The participant improves quality of Life and PHQ9 Scores as seen by post scores and/or verbalization of changes          Quality of Life Scores:   Quality of Life - 08/20/24 1351       Quality of Life   Select Quality of Life      Quality of Life Scores   Health/Function Pre 17.6 %    Socioeconomic Pre 23.63 %    Psych/Spiritual Pre 21 %    Family Pre 5.4 %    GLOBAL Pre 17.91 %         Scores of 19 and below usually indicate a poorer quality of life in these areas.  A difference of  2-3 points is a clinically meaningful difference.  A difference of 2-3 points in the total score of the Quality of Life Index has been associated with significant improvement in overall quality of life, self-image, physical symptoms, and general health in studies assessing change in quality of life.  PHQ-9: Review Flowsheet  More data exists      09/27/2024 08/29/2024 08/23/2024 03/21/2023 12/17/2022  Depression screen PHQ 2/9  Decreased Interest 0 0 1 0 0  Down, Depressed, Hopeless 0 0 0 0 0  PHQ - 2 Score 0 0 1 0 0  Altered sleeping 2 2 1  0 3  Tired, decreased energy 3 0 3 0 0  Change in appetite 0 0 0 0 0  Feeling bad or failure about yourself  0 0 1 0 0  Trouble concentrating 0 0 1 0 0  Moving slowly or fidgety/restless 0 0 0 0 0  Suicidal thoughts 0 0 0 0 0  PHQ-9 Score 5 2 7   0  3   Difficult doing work/chores Not difficult at all Not difficult at all Somewhat difficult Not difficult at all Not difficult at all    Details       Data saved with a previous flowsheet row definition        Interpretation of Total Score  Total Score Depression Severity:  1-4 = Minimal depression, 5-9 = Mild depression, 10-14 = Moderate depression, 15-19 = Moderately severe depression, 20-27 = Severe depression   Psychosocial Evaluation and Intervention:  Psychosocial Evaluation - 08/20/24 1412       Psychosocial Evaluation & Interventions    Interventions Stress management education;Relaxation education;Encouraged to exercise with the program and follow exercise prescription  Comments 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 She states she still feels short of breath and finds herself more anxious than before. Things like [place] the interstate and feeling unable to twist to merge make her anxious which was not the case prior to surgery. She also notes her blood sugar has been more elevated than prior to her surgery and thinks it is the stress. She is ready to come to the program to learn more about heart healthy lifestyle    Expected Outcomes Short: attend cardiac rehab for education and exercise Long: develop and maintain positive self care habits    Continue Psychosocial Services  Follow up required by staff          Psychosocial Re-Evaluation:  Psychosocial Re-Evaluation     Row Name 09/27/24 1139             Psychosocial Re-Evaluation   Current issues with Current Stress Concerns       Comments Patients initial stress came from her lacking stamina. Reviewed patient health questionnaire (PHQ-9) with patient for follow up. Previously, patients score indicated signs/symptoms of depression.  Reviewed to see if patient is improving symptom wise while in program.  Score improved and patient states that it is because she has gained more stamina.       Expected Outcomes Short: Continue to attend [facility] regularly for regular exercise and social engagement. Long: Continue to improve symptoms and manage a positive mental state.       Interventions Encouraged to attend Cardiac Rehabilitation for the exercise       Continue Psychosocial Services  Follow up required by staff          Psychosocial Discharge (Final Psychosocial Re-Evaluation):  Psychosocial Re-Evaluation - 09/27/24 1139       Psychosocial Re-Evaluation   Current issues with Current Stress Concerns    Comments Patients initial stress  came from her lacking stamina. Reviewed patient health questionnaire (PHQ-9) with patient for follow up. Previously, patients score indicated signs/symptoms of depression.  Reviewed to see if patient is improving symptom wise while in program.  Score improved and patient states that it is because she has gained more stamina.    Expected Outcomes Short: Continue to attend [facility] regularly for regular exercise and social engagement. Long: Continue to improve symptoms and manage a positive mental state.    Interventions Encouraged to attend Cardiac Rehabilitation for the exercise    Continue Psychosocial Services  Follow up required by staff          Vocational Rehabilitation: Provide vocational rehab assistance to qualifying candidates.   Vocational Rehab Evaluation & Intervention:  Vocational Rehab - 08/20/24 1343       Initial Vocational Rehab Evaluation & Intervention   Assessment shows need for Vocational Rehabilitation No          Education: Education Goals: Education classes will be provided on a variety of topics geared toward better understanding of heart health and risk factor modification. Participant will state understanding/return demonstration of topics presented as noted by education test scores.  Learning Barriers/Preferences:  Learning Barriers/Preferences - 08/20/24 1337       Learning Barriers/Preferences   Learning Barriers None    Learning Preferences None          General Cardiac Education Topics:  AED/CPR: - Group verbal and written instruction with the use of models to demonstrate the basic use of the AED with the basic ABC's of resuscitation.   Test  and Procedures: - Group verbal and visual presentation and models provide information about basic cardiac anatomy and function. Reviews the testing methods done to diagnose heart disease and the outcomes of the test results. Describes the treatment choices: Medical Management, Angioplasty, or Coronary  Bypass Surgery for treating various heart conditions including Myocardial Infarction, Angina, Valve Disease, and Cardiac Arrhythmias. Written material provided at class time. Flowsheet Row Cardiac Rehab from 08/23/2024 in [facility] Cardiac and Pulmonary Rehab  Education need identified 08/23/24    Medication Safety: - Group verbal and visual instruction to review commonly prescribed medications for heart and lung disease. Reviews the medication, class of the drug, and side effects. Includes the steps to properly store meds and maintain the prescription regimen. Written material provided at class time.   Intimacy: - Group verbal instruction through game format to discuss how heart and lung disease can affect sexual intimacy. Written material provided at class time.   Know Your Numbers and Heart Failure: - Group verbal and visual instruction to discuss disease risk factors for cardiac and pulmonary disease and treatment options.  Reviews associated critical values for Overweight/Obesity, Hypertension, Cholesterol, and Diabetes.  Discusses basics of heart failure: signs/symptoms and treatments.  Introduces Heart Failure Zone chart for action plan for heart failure. Written material provided at class time. Flowsheet Row Cardiac Rehab from 08/23/2024 in [facility] Cardiac and Pulmonary Rehab  Education need identified 08/23/24    Infection Prevention: - Provides verbal and written material to individual with discussion of infection control including proper hand washing and proper equipment cleaning during exercise session. Flowsheet Row Cardiac Rehab from 08/23/2024 in [facility] Cardiac and Pulmonary Rehab  Date 08/23/24  Educator MB  Instruction Review Code 1- Verbalizes Understanding    Falls Prevention: - Provides verbal and written material to individual with discussion of falls prevention and safety. Flowsheet Row Cardiac Rehab from 08/23/2024 in [facility] Cardiac and Pulmonary Rehab  Date 08/23/24  Educator  MB  Instruction Review Code 1- Verbalizes Understanding    Other: -Provides group and verbal instruction on various topics (see comments)   Knowledge Questionnaire Score:  Knowledge Questionnaire Score - 08/20/24 1351       Knowledge Questionnaire Score   Pre Score 22/26          Core Components/Risk Factors/Patient Goals at Admission:  Personal Goals and Risk Factors at Admission - 08/23/24 0959       Core Components/Risk Factors/Patient Goals on Admission    Weight Management Yes;Weight Maintenance   Does not like to set specific weight goal, focuses on healthy lifestyle   Intervention Weight Management: Develop a combined nutrition and exercise program designed to reach desired caloric intake, while maintaining appropriate intake of nutrient and fiber, sodium and fats, and appropriate energy expenditure required for the weight goal.;Weight Management: Provide education and appropriate resources to help participant work on and attain dietary goals.;Weight Management/Obesity: Establish reasonable short term and long term weight goals.    Expected Outcomes Short Term: Continue to assess and modify interventions until short term weight is achieved;Long Term: Adherence to nutrition and physical activity/exercise program aimed toward attainment of established weight goal;Understanding recommendations for meals to include 15-35% energy as protein, 25-35% energy from fat, 35-60% energy from carbohydrates, less than 200mg  of dietary cholesterol, 20-35 gm of total fiber daily;Understanding of distribution of calorie intake throughout the day with the consumption of 4-5 meals/snacks;Weight Maintenance: Understanding of the daily nutrition guidelines, which includes 25-35% calories from fat, 7% or less cal from saturated fats,  less than 200mg  cholesterol, less than 1.5gm of sodium, & 5 or more servings of fruits and vegetables daily    Diabetes Yes    Intervention Provide education about  signs/symptoms and action to take for hypo/hyperglycemia.;Provide education about proper nutrition, including hydration, and aerobic/resistive exercise prescription along with prescribed medications to achieve blood glucose in normal ranges: Fasting glucose 65-99 mg/dL    Expected Outcomes Short Term: Participant verbalizes understanding of the signs/symptoms and immediate care of hyper/hypoglycemia, proper foot care and importance of medication, aerobic/resistive exercise and nutrition plan for blood glucose control.;Long Term: Attainment of HbA1C < 7%.    Hypertension Yes    Intervention Provide education on lifestyle modifcations including regular physical activity/exercise, weight management, moderate sodium restriction and increased consumption of fresh fruit, vegetables, and low fat dairy, alcohol moderation, and smoking cessation.;Monitor prescription use compliance.    Expected Outcomes Short Term: Continued assessment and intervention until BP is < 140/53mm HG in hypertensive participants. < 130/52mm HG in hypertensive participants with diabetes, heart failure or chronic kidney disease.;Long Term: Maintenance of blood pressure at goal levels.    Lipids Yes    Intervention Provide education and support for participant on nutrition & aerobic/resistive exercise along with prescribed medications to achieve LDL 70mg , HDL >40mg .    Expected Outcomes Short Term: Participant states understanding of desired cholesterol values and is compliant with medications prescribed. Participant is following exercise prescription and nutrition guidelines.;Long Term: Cholesterol controlled with medications as prescribed, with individualized exercise RX and with personalized nutrition plan. Value goals: LDL < 70mg , HDL > 40 mg.          Education:Diabetes - Individual verbal and written instruction to review signs/symptoms of diabetes, desired ranges of glucose level fasting, after meals and with exercise.  Acknowledge that pre and post exercise glucose checks will be done for 3 sessions at entry of program. Flowsheet Row Cardiac Rehab from 08/23/2024 in [facility] Cardiac and Pulmonary Rehab  Date 08/23/24  Educator MB  Instruction Review Code 1- Verbalizes Understanding    Core Components/Risk Factors/Patient Goals Review:   Goals and Risk Factor Review     Row Name 09/27/24 1132             Core Components/Risk Factors/Patient Goals Review   Personal Goals Review Diabetes;Hypertension;Lipids       Review Shakti has been doing better with her diabetes. Her average blood sugars have been around 168. She wants to focus more on lifestyle rather than a weight goal. Her blood pressure has been good and she is checking her vitals at home. Her lipids are doing better and is taking Ezitamibe rather than her old statins that made her have memory issues. She wants to work on getting her stamina back while she is in the program. Flara can check her blood sugar at home with her [place] 3.       Expected Outcomes ShortL continue to check blood pressure and sugars at home. Long: maintain blood pressure and blood sugar independently.          Core Components/Risk Factors/Patient Goals at Discharge (Final Review):   Goals and Risk Factor Review - 09/27/24 1132       Core Components/Risk Factors/Patient Goals Review   Personal Goals Review Diabetes;Hypertension;Lipids    Review Jasey has been doing better with her diabetes. Her average blood sugars have been around 168. She wants to focus more on lifestyle rather than a weight goal. Her blood pressure has been good and she is  checking her vitals at home. Her lipids are doing better and is taking Ezitamibe rather than her old statins that made her have memory issues. She wants to work on getting her stamina back while she is in the program. Joceline can check her blood sugar at home with her [place] 3.    Expected Outcomes ShortL continue to check blood pressure and  sugars at home. Long: maintain blood pressure and blood sugar independently.          ITP Comments:  ITP Comments     Row Name 08/20/24 1410 08/23/24 0953 08/28/24 1112 09/05/24 1048 10/03/24 0814   ITP Comments Initial phone call completed. Diagnosis can be found in [facility] 9/18. EP Orientation scheduled for Thursday 11/6 at 8am. Completed and gym orientation for cardiac rehab. Initial ITP created and sent for review to Dr. Oneil Pinal, Medical Director. First full day of exercise!  Patient was oriented to gym and equipment including functions, settings, policies, and procedures.  Patient's individual exercise prescription and treatment plan were reviewed.  All starting workloads were established based on the results of the 6 minute walk test done at initial orientation visit.  The plan for exercise progression was also introduced and progression will be customized based on patient's performance and goals. 30 Day review completed. Medical Director ITP review done, changes made as directed, and signed approval by Medical Director. New to program 30 Day review completed. Medical Director ITP review done, changes made as directed, and signed approval by Medical Director.      Comments: 30 day review     [1]  Current Outpatient Medications:    albuterol  (VENTOLIN  HFA) 108 (90 Base) MCG/ACT inhaler, Inhale 2 puffs into the lungs every 6 (six) hours as needed for wheezing or shortness of breath., Disp: 8 g, Rfl: 2   amiodarone  (PACERONE ) 200 MG tablet, Take 0.5 tablets (100 mg total) by mouth daily. (Patient taking differently: Take 100 mg by mouth daily. Thru 10/08/2024), Disp: , Rfl:    amLODipine  (NORVASC ) 10 MG tablet, Take 1 tablet (10 mg total) by mouth daily., Disp: 90 tablet, Rfl: 3   aspirin  EC 325 MG tablet, Take 1 tablet (325 mg total) by mouth daily., Disp: , Rfl:    benazepril  (LOTENSIN ) 40 MG tablet, Take 1 tablet (40 mg total) by mouth daily., Disp: 90 tablet, Rfl: 3   Blood  Glucose Monitoring Suppl (CONTOUR NEXT USB MONITOR) w/Device KIT, 1 kit by Does not apply route daily. To check blood sugar once daily., Disp: 1 kit, Rfl: 0   CareTouch Safety Lancets 26G MISC, 1 Device by Does not apply route daily. To check blood sugar once daily., Disp: 100 each, Rfl: 12   cetirizine  (ZYRTEC ) 10 MG tablet, Take 1 tablet by mouth once daily, Disp: 90 tablet, Rfl: 0   Continuous Glucose Sensor (FREESTYLE LIBRE 3 PLUS SENSOR) MISC, USE AS DIRECTED. CHANGE SENSOR EVERY 15 DAYS., Disp: 6 each, Rfl: 1   dapagliflozin  propanediol (FARXIGA ) 10 MG TABS tablet, Take 1 tablet (10 mg total) by mouth daily. For 2025 [place], Disp: 90 tablet, Rfl: 3   ezetimibe  (ZETIA ) 10 MG tablet, Take 1 tablet (10 mg total) by mouth daily., Disp: 90 tablet, Rfl: 0   fluticasone -salmeterol (WIXELA INHUB) 100-50 MCG/ACT AEPB, Inhale 1 puff into the lungs 2 (two) times daily., Disp: 1 each, Rfl: 3   glucose blood (CONTOUR NEXT TEST) test strip, To check blood sugar once daily., Disp: 100 each, Rfl: 5  hydrochlorothiazide  (HYDRODIURIL ) 12.5 MG tablet, Take 1 tablet (12.5 mg total) by mouth daily., Disp: 90 tablet, Rfl: 3   insulin  aspart (NOVOLOG ) 100 UNIT/ML FlexPen, Inject 4 to 10 units up to 3 times a day with a meal., Disp: 15 mL, Rfl: 0   insulin  degludec ([name]  FLEXTOUCH) 100 UNIT/ML FlexTouch Pen, Inject 15 Units into the skin daily. (Patient taking differently: Inject 23 Units into the skin daily.), Disp: , Rfl:    Insulin  Pen Needle (NOVOFINE PLUS) 32G X 4 MM MISC, To use with ozempic  injections, Disp: 100 each, Rfl: 3   metoprolol  tartrate (LOPRESSOR ) 25 MG tablet, Take 1.5 tablets (37.5 mg total) by mouth 2 (two) times daily., Disp: 90 tablet, Rfl: 3   montelukast  (SINGULAIR ) 10 MG tablet, TAKE 1 TABLET BY MOUTH AT BEDTIME, Disp: 90 tablet, Rfl: 0   omeprazole  (PRILOSEC  OTC) 20 MG tablet, Take 1 tablet (20 mg total) by mouth daily., Disp: 90 tablet, Rfl: 1 [2]  Social  History Tobacco Use  Smoking Status Never  Smokeless Tobacco Never

## 2024-10-04 ENCOUNTER — Encounter

## 2024-10-04 DIAGNOSIS — Z952 Presence of prosthetic heart valve: Secondary | ICD-10-CM

## 2024-10-04 NOTE — Progress Notes (Signed)
 Daily Session Note  Patient Details  Name: Shari Lee MRN: [id number] Date of Birth: 03-Jun-1956 Referring Provider:   Flowsheet Row Cardiac Rehab from 08/23/2024 in [facility] Cardiac and Pulmonary Rehab  Referring Provider Darliss Rogue, MD    Encounter Date: 10/04/2024  Check In:  Session Check In - 10/04/24 1118       Check-In   Supervising physician immediately available to respond to emergencies See telemetry face sheet for immediately available ER MD    Location [facility]-Cardiac & Pulmonary Rehab    Staff Present Leita Franks RN,BSN;Joseph [facility] BS, Exercise Physiologist;Margaret Best, MS, Exercise Physiologist    Virtual Visit No    Medication changes reported     No    Fall or balance concerns reported    No    Warm-up and Cool-down Performed on first and last piece of equipment    Resistance Training Performed Yes    VAD Patient? No    PAD/SET Patient? No      Pain Assessment   Currently in Pain? No/denies             Tobacco Use History[1]  Goals Met:  Independence with exercise equipment Exercise tolerated well No report of concerns or symptoms today Strength training completed today  Goals Unmet:  Not Applicable  Comments: Pt able to follow exercise prescription today without complaint.  Will continue to monitor for progression.    Dr. Oneil Pinal is Medical Director for [facility] Cardiac Rehabilitation.  Dr. Fuad Aleskerov is Medical Director for [facility] Pulmonary Rehabilitation.    [1]  Social History Tobacco Use  Smoking Status Never  Smokeless Tobacco Never

## 2024-10-09 ENCOUNTER — Encounter

## 2024-10-15 ENCOUNTER — Telehealth: Payer: Self-pay | Admitting: Pharmacist

## 2024-10-15 ENCOUNTER — Other Ambulatory Visit

## 2024-10-15 NOTE — Telephone Encounter (Signed)
 Attempt was made to contact patient by phone today for follow up by Clinical Pharmacist regarding diabetes management.  Unable to reach patient. LM on VM with my contact number [phone].

## 2024-10-16 ENCOUNTER — Encounter

## 2024-10-16 DIAGNOSIS — Z952 Presence of prosthetic heart valve: Secondary | ICD-10-CM | POA: Diagnosis not present

## 2024-10-16 NOTE — Telephone Encounter (Signed)
 PAP: Patient assistance application for Novolog  and [name]  has been approved by [employer]: NovoNordisk from 10/18/2024 to 10/17/2025. Medication should be delivered to PAP Delivery: Provider's office. For further shipping updates, please contact Novo Nordisk at [phone]. Patient ID is: [id number].

## 2024-10-16 NOTE — Progress Notes (Signed)
 Daily Session Note  Patient Details  Name: Cherika Jessie MRN: [id number] Date of Birth: 02/12/1956 Referring Provider:   Flowsheet Row Cardiac Rehab from 08/23/2024 in [facility] Cardiac and Pulmonary Rehab  Referring Provider Darliss Rogue, MD    Encounter Date: 10/16/2024  Check In:  Session Check In - 10/16/24 1123       Check-In   Supervising physician immediately available to respond to emergencies See telemetry face sheet for immediately available ER MD    Location [facility]-Cardiac & Pulmonary Rehab    Staff Present Burnard Davenport RN,BSN,MPA;Maxon Conetta BS, Exercise Physiologist;Meredith Tressa RN,BSN;Jason Elnor [facility]    Virtual Visit No    Medication changes reported     No    Fall or balance concerns reported    No    Warm-up and Cool-down Performed on first and last piece of equipment    Resistance Training Performed Yes    VAD Patient? No    PAD/SET Patient? No      Pain Assessment   Currently in Pain? No/denies             Tobacco Use History[1]  Goals Met:  Independence with exercise equipment Exercise tolerated well No report of concerns or symptoms today Strength training completed today  Goals Unmet:  Not Applicable  Comments: Pt able to follow exercise prescription today without complaint.  Will continue to monitor for progression.    Dr. Oneil Pinal is Medical Director for [facility] Cardiac Rehabilitation.  Dr. Fuad Aleskerov is Medical Director for [facility] Pulmonary Rehabilitation.    [1]  Social History Tobacco Use  Smoking Status Never  Smokeless Tobacco Never

## 2024-10-22 ENCOUNTER — Other Ambulatory Visit

## 2024-10-22 ENCOUNTER — Telehealth: Payer: Self-pay | Admitting: Pharmacist

## 2024-10-22 NOTE — Telephone Encounter (Signed)
 Second attempt was made to contact patient by phone today for follow up by Clinical Pharmacist regarding diabetes / BG.  Unable to reach patient. LM on VM with my contact number [phone].

## 2024-10-23 ENCOUNTER — Encounter: Attending: Cardiology

## 2024-10-23 DIAGNOSIS — Z48812 Encounter for surgical aftercare following surgery on the circulatory system: Secondary | ICD-10-CM | POA: Diagnosis present

## 2024-10-23 DIAGNOSIS — Z952 Presence of prosthetic heart valve: Secondary | ICD-10-CM | POA: Diagnosis present

## 2024-10-23 NOTE — Progress Notes (Signed)
 Daily Session Note  Patient Details  Name: Shari Lee MRN: [id number] Date of Birth: January 31, 1956 Referring Provider:   Flowsheet Row Cardiac Rehab from 08/23/2024 in [facility] Cardiac and Pulmonary Rehab  Referring Provider Darliss Rogue, MD    Encounter Date: 10/23/2024  Check In:  Session Check In - 10/23/24 1127       Check-In   Supervising physician immediately available to respond to emergencies See telemetry face sheet for immediately available ER MD    Location [facility]-Cardiac & Pulmonary Rehab    Staff Present Burnard Davenport RN,BSN,MPA;Meredith Tressa RN,BSN;Maxon Burnell BS, Exercise Physiologist;Noah Tickle, BS, Exercise Physiologist;Jason Elnor RDN,LDN    Virtual Visit No    Medication changes reported     No    Fall or balance concerns reported    No    Warm-up and Cool-down Performed on first and last piece of equipment    Resistance Training Performed Yes    VAD Patient? No    PAD/SET Patient? No      Pain Assessment   Currently in Pain? No/denies             Tobacco Use History[1]  Goals Met:  Independence with exercise equipment Exercise tolerated well No report of concerns or symptoms today Strength training completed today  Goals Unmet:  Not Applicable  Comments: Pt able to follow exercise prescription today without complaint.  Will continue to monitor for progression.    Dr. Oneil Pinal is Medical Director for [facility] Cardiac Rehabilitation.  Dr. Fuad Aleskerov is Medical Director for [facility] Pulmonary Rehabilitation.    [1]  Social History Tobacco Use  Smoking Status Never  Smokeless Tobacco Never

## 2024-10-25 ENCOUNTER — Encounter: Admitting: Emergency Medicine

## 2024-10-25 DIAGNOSIS — Z952 Presence of prosthetic heart valve: Secondary | ICD-10-CM

## 2024-10-25 DIAGNOSIS — Z48812 Encounter for surgical aftercare following surgery on the circulatory system: Secondary | ICD-10-CM | POA: Diagnosis not present

## 2024-10-25 NOTE — Progress Notes (Signed)
 Daily Session Note  Patient Details  Name: Shari Lee MRN: [id number] Date of Birth: Sep 18, 1956 Referring Provider:   Flowsheet Row Cardiac Rehab from 08/23/2024 in [facility] Cardiac and Pulmonary Rehab  Referring Provider Darliss Rogue, MD    Encounter Date: 10/25/2024  Check In:  Session Check In - 10/25/24 1131       Check-In   Supervising physician immediately available to respond to emergencies See telemetry face sheet for immediately available ER MD    Location [facility]-Cardiac & Pulmonary Rehab    Staff Present Leita Franks RN,BSN;Joseph [facility] RCP,RRT,BSRT;Noah Tickle, [place], Exercise Physiologist;Jason Elnor [facility]    Virtual Visit No    Medication changes reported     No    Fall or balance concerns reported    No    Warm-up and Cool-down Performed on first and last piece of equipment    Resistance Training Performed Yes    VAD Patient? No    PAD/SET Patient? No      Pain Assessment   Currently in Pain? No/denies             Tobacco Use History[1]  Goals Met:  Independence with exercise equipment Exercise tolerated well No report of concerns or symptoms today Strength training completed today  Goals Unmet:  Not Applicable  Comments: Pt able to follow exercise prescription today without complaint.  Will continue to monitor for progression.    Dr. Oneil Pinal is Medical Director for [facility] Cardiac Rehabilitation.  Dr. Fuad Aleskerov is Medical Director for [facility] Pulmonary Rehabilitation.    [1]  Social History Tobacco Use  Smoking Status Never  Smokeless Tobacco Never

## 2024-10-30 ENCOUNTER — Encounter

## 2024-10-30 DIAGNOSIS — Z48812 Encounter for surgical aftercare following surgery on the circulatory system: Secondary | ICD-10-CM | POA: Diagnosis not present

## 2024-10-30 DIAGNOSIS — Z952 Presence of prosthetic heart valve: Secondary | ICD-10-CM

## 2024-10-30 NOTE — Progress Notes (Signed)
 Daily Session Note  Patient Details  Name: Shari Lee MRN: [id number] Date of Birth: [birth year] Referring Provider:   Flowsheet Row Cardiac Rehab from 08/23/2024 in [facility] Cardiac and Pulmonary Rehab  Referring Provider Darliss Rogue, MD    Encounter Date: 10/30/2024  Check In:  Session Check In - 10/30/24 1107       Check-In   Supervising physician immediately available to respond to emergencies See telemetry face sheet for immediately available ER MD    Location [facility]-Cardiac & Pulmonary Rehab    Staff Present Burnard Davenport RN,BSN,MPA;Meredith Tressa RN,BSN;Margaret Best, MS, Exercise Physiologist;Jason Elnor [facility]    Virtual Visit No    Medication changes reported     No    Fall or balance concerns reported    No    Warm-up and Cool-down Performed on first and last piece of equipment    Resistance Training Performed Yes    VAD Patient? No    PAD/SET Patient? No      Pain Assessment   Currently in Pain? No/denies             Tobacco Use History[1]  Goals Met:  Independence with exercise equipment Exercise tolerated well No report of concerns or symptoms today Strength training completed today  Goals Unmet:  Not Applicable  Comments: Pt able to follow exercise prescription today without complaint.  Will continue to monitor for progression.    Dr. Oneil Pinal is Medical Director for [facility] Cardiac Rehabilitation.  Dr. Fuad Aleskerov is Medical Director for [facility] Pulmonary Rehabilitation.    [1]  Social History Tobacco Use  Smoking Status Never  Smokeless Tobacco Never

## 2024-10-31 ENCOUNTER — Encounter: Payer: Self-pay | Admitting: *Deleted

## 2024-10-31 DIAGNOSIS — Z952 Presence of prosthetic heart valve: Secondary | ICD-10-CM

## 2024-10-31 NOTE — Progress Notes (Signed)
 Cardiac Individual Treatment Plan  Patient Details  Name: Shari Lee MRN: [id number] Date of Birth: [birth year] Referring Provider:   Flowsheet Row Cardiac Rehab from 08/23/2024 in [facility] Cardiac and Pulmonary Rehab  Referring Provider Darliss Rogue, MD    Initial Encounter Date:  Flowsheet Row Cardiac Rehab from 08/23/2024 in [facility] Cardiac and Pulmonary Rehab  Date 08/23/24    Visit Diagnosis: S/P AVR  Patient's Home Medications on Admission: Current Medications[1]  Past Medical History: Past Medical History:  Diagnosis Date   Asthma    mild   Diarrhea    N/V recently   Dyspnea    GERD (gastroesophageal reflux disease)    Headache    Heart murmur    developed in last couple years/ no issues   HOH (hard of hearing)    wears aids   Hypertension    Motion sickness    car   Sleep apnea    Type 2 diabetes mellitus with hyperglycemia (HCC)     Tobacco Use: Tobacco Use History[2]  Labs: Review Flowsheet  More data exists      Latest Ref Rng & Units 10/20/2023 01/24/2024 03/26/2024 07/03/2024 07/05/2024  Labs for ITP Cardiac and Pulmonary Rehab  Cholestrol 0 - 200 mg/dL 773  810  - - -  LDL (calc) 0 - 99 mg/dL 864  889  - - -  HDL-C >39.00 mg/dL 22.49  44.59  - - -  Trlycerides 0.0 - 149.0 mg/dL 33.9  880.9  - - -  Hemoglobin A1c 4.8 - 5.6 % 9.7  9.0  - 8.9  -  PH, Arterial 7.35 - 7.45 - - 7.341  - 7.271  7.290  7.266  7.185  7.236  7.354  7.391  7.415  7.368  7.396  7.225   PCO2 arterial 32 - 48 mmHg - - 42.5  - 42.0  40.4  46.9  54.8  41.0  34.9  33.8  34.2  41.3  36.5  58.7   Bicarbonate 20.0 - 28.0 mmol/L - - 25.0  23.0  - 19.3  19.4  21.4  20.8  17.4  19.5  20.5  21.9  23.8  23.9  22.4  24.3   TCO2 22 - 32 mmol/L - - 26  24  - 21  21  23  22  19  21  22  24  23  25  24  25  23  21  26  24    Acid-base deficit 0.0 - 2.0 mmol/L - - 2.0  3.0  - 7.0  7.0  5.0  7.0  9.0  6.0  4.0  2.0  1.0  1.0  2.0  4.0   O2 Saturation % - - 73  98  - 91  94  94  93  97  100  100  100   100  79  100  89     Details       Multiple values from one day are sorted in reverse-chronological order          Exercise Target Goals: Exercise Program Goal: Individual exercise prescription set using results from initial 6 min walk test and THRR while considering  patients activity barriers and safety.   Exercise Prescription Goal: Initial exercise prescription builds to 30-45 minutes a day of aerobic activity, 2-3 days per week.  Home exercise guidelines will be given to patient during program as part of exercise prescription that the participant will acknowledge.   Education: Aerobic Exercise: -  Group verbal and visual presentation on the components of exercise prescription. Introduces F.I.T.T principle from ACSM for exercise prescriptions.  Reviews F.I.T.T. principles of aerobic exercise including progression. Written material provided at class time.   Education: Resistance Exercise: - Group verbal and visual presentation on the components of exercise prescription. Introduces F.I.T.T principle from ACSM for exercise prescriptions  Reviews F.I.T.T. principles of resistance exercise including progression. Written material provided at class time.    Education: Exercise & Equipment Safety: - Individual verbal instruction and demonstration of equipment use and safety with use of the equipment. Flowsheet Row Cardiac Rehab from 08/23/2024 in [facility] Cardiac and Pulmonary Rehab  Date 08/23/24  Educator MB  Instruction Review Code 1- Verbalizes Understanding    Education: Exercise Physiology & General Exercise Guidelines: - Group verbal and written instruction with models to review the exercise physiology of the cardiovascular system and associated critical values. Provides general exercise guidelines with specific guidelines to those with heart or lung disease. Written material provided at class time. Flowsheet Row Cardiac Rehab from 08/23/2024 in [facility] Cardiac and Pulmonary Rehab   Education need identified 08/23/24    Education: Flexibility, Balance, Mind/Body Relaxation: - Group verbal and visual presentation with interactive activity on the components of exercise prescription. Introduces F.I.T.T principle from ACSM for exercise prescriptions. Reviews F.I.T.T. principles of flexibility and balance exercise training including progression. Also discusses the mind body connection.  Reviews various relaxation techniques to help reduce and manage stress (i.e. Deep breathing, progressive muscle relaxation, and visualization). Balance handout provided to take home. Written material provided at class time.   Activity Barriers & Risk Stratification:  Activity Barriers & Cardiac Risk Stratification - 08/23/24 0954       Activity Barriers & Cardiac Risk Stratification   Activity Barriers Joint Problems;Shortness of Breath;Other (comment)    Comments Bilateral knee and hip pain, L foot plantar fasciitis, 10 lb lifting restriction for 3 months, hearing aid    Cardiac Risk Stratification Moderate          6 Minute Walk:  6 Minute Walk     Row Name 08/23/24 0953         6 Minute Walk   Phase Initial     Distance 1135 feet     Walk Time 6 minutes     # of Rest Breaks 0     MPH 2.15     METS 2.14     RPE 9     Perceived Dyspnea  2     VO2 Peak 7.48     Symptoms No     Resting HR 72 bpm     Resting BP 110/64     Resting Oxygen Saturation  100 %     Exercise Oxygen Saturation  during 6 min walk 98 %     Max Ex. HR 98 bpm     Max Ex. BP 118/60     2 Minute Post BP 104/60        Oxygen Initial Assessment:   Oxygen Re-Evaluation:   Oxygen Discharge (Final Oxygen Re-Evaluation):   Initial Exercise Prescription:  Initial Exercise Prescription - 08/23/24 0900       Date of Initial Exercise RX and Referring Provider   Date 08/23/24    Referring Provider Darliss Rogue, MD      Oxygen   Maintain Oxygen Saturation 88% or higher      NuStep    Level 2    SPM 80    Minutes 15  METs 2.14      REL-XR   Level 1    Speed 50    Minutes 15    METs 2.14      T5 Nustep   Level 2    SPM 80    Minutes 15    METs 2.14      Track   Laps 30    Minutes 15    METs 2.63      Prescription Details   Frequency (times per week) 2    Duration Progress to 30 minutes of continuous aerobic without signs/symptoms of physical distress      Intensity   THRR 40-80% of Max Heartrate 104-136    Ratings of Perceived Exertion 11-13    Perceived Dyspnea 0-4      Progression   Progression Continue to progress workloads to maintain intensity without signs/symptoms of physical distress.      Resistance Training   Training Prescription Yes    Weight 4 lb    Reps 10-15          Perform Capillary Blood Glucose checks as needed.  Exercise Prescription Changes:   Exercise Prescription Changes     Row Name 08/23/24 0900 09/06/24 1100 09/20/24 1100 10/04/24 0800 10/23/24 1400     Response to Exercise   Blood Pressure (Admit) 110/64 126/64 114/60 118/62 122/60   Blood Pressure (Exercise) 118/60 122/64 134/62 130/66 138/70   Blood Pressure (Exit) 104/60 102/60 112/62 112/60 100/60   Heart Rate (Admit) 72 bpm 72 bpm 79 bpm 56 bpm 70 bpm   Heart Rate (Exercise) 98 bpm 102 bpm 117 bpm 107 bpm 97 bpm   Heart Rate (Exit) 78 bpm 70 bpm 70 bpm 67 bpm 79 bpm   Oxygen Saturation (Admit) 100 % -- -- -- --   Oxygen Saturation (Exercise) 98 % -- -- -- --   Oxygen Saturation (Exit) 99 % -- -- -- --   Rating of Perceived Exertion (Exercise) 9 13 13 13 13    Perceived Dyspnea (Exercise) 2 -- -- -- --   Symptoms none none none none none   Comments results 1st week of exercise sessions -- -- --   Duration -- Progress to 30 minutes of  aerobic without signs/symptoms of physical distress Continue with 30 min of aerobic exercise without signs/symptoms of physical distress. Continue with 30 min of aerobic exercise without signs/symptoms of physical  distress. Continue with 30 min of aerobic exercise without signs/symptoms of physical distress.   Intensity -- THRR unchanged THRR unchanged THRR unchanged THRR unchanged     Progression   Progression -- Continue to progress workloads to maintain intensity without signs/symptoms of physical distress. Continue to progress workloads to maintain intensity without signs/symptoms of physical distress. Continue to progress workloads to maintain intensity without signs/symptoms of physical distress. Continue to progress workloads to maintain intensity without signs/symptoms of physical distress.   Average METs 2.14 2.22 2.49 2.77 2.54     Resistance Training   Training Prescription -- Yes Yes -- --   Weight -- 4 lb 4 lb 4 lb 3 lb   Reps -- 10-15 10-15 10-15 10-15     Interval Training   Interval Training -- No No No No     NuStep   Level -- 2 4 4 4    Minutes -- 15 15 15 15    METs -- 2.3 2.6 3.2 2.9     REL-XR   Level -- 1 -- -- --   Minutes --  15 -- -- --   METs -- 3.5 -- -- --     T5 Nustep   Level -- -- 3 -- --   Minutes -- -- 15 -- --   METs -- -- 1.7 -- --     Biostep-RELP   Level -- -- -- 2 2   Minutes -- -- -- 15 15     Track   Laps -- 10 35 35 30   Minutes -- 15 15 15 15    METs -- 1.54 2.9 2.9 2.63     Oxygen   Maintain Oxygen Saturation -- 88% or higher 88% or higher 88% or higher 88% or higher    Row Name 10/29/24 1100             Response to Exercise   Blood Pressure (Admit) 118/64       Blood Pressure (Exit) 116/64       Heart Rate (Admit) 71 bpm       Heart Rate (Exercise) 109 bpm       Heart Rate (Exit) 64 bpm       Rating of Perceived Exertion (Exercise) 11       Symptoms none       Duration Continue with 30 min of aerobic exercise without signs/symptoms of physical distress.       Intensity THRR unchanged         Progression   Progression Continue to progress workloads to maintain intensity without signs/symptoms of physical distress.       Average  METs 2.58         Resistance Training   Weight 3 lb       Reps 10-15         Interval Training   Interval Training No         NuStep   Level 5       Minutes 15         Biostep-RELP   Level 2       Minutes 15       METs 2         Track   Laps 34       Minutes 15       METs 2.85         Oxygen   Maintain Oxygen Saturation 88% or higher          Exercise Comments:   Exercise Goals and Review:   Exercise Goals     Row Name 08/23/24 0957             Exercise Goals   Increase Physical Activity Yes       Intervention Provide advice, education, support and counseling about physical activity/exercise needs.;Develop an individualized exercise prescription for aerobic and resistive training based on initial evaluation findings, risk stratification, comorbidities and participant's personal goals.       Expected Outcomes Short Term: Attend rehab on a regular basis to increase amount of physical activity.;Long Term: Add in home exercise to make exercise part of routine and to increase amount of physical activity.;Long Term: Exercising regularly at least 3-5 days a week.       Increase Strength and Stamina Yes       Intervention Provide advice, education, support and counseling about physical activity/exercise needs.;Develop an individualized exercise prescription for aerobic and resistive training based on initial evaluation findings, risk stratification, comorbidities and participant's personal goals.       Expected Outcomes Short Term: Increase workloads from  initial exercise prescription for resistance, speed, and METs.;Short Term: Perform resistance training exercises routinely during rehab and add in resistance training at home;Long Term: Improve cardiorespiratory fitness, muscular endurance and strength as measured by increased METs and functional capacity ( )       Able to understand and use rate of perceived exertion (RPE) scale Yes       Intervention Provide education  and explanation on how to use RPE scale       Expected Outcomes Short Term: Able to use RPE daily in rehab to express subjective intensity level;Long Term:  Able to use RPE to guide intensity level when exercising independently       Able to understand and use Dyspnea scale Yes       Intervention Provide education and explanation on how to use Dyspnea scale       Expected Outcomes Short Term: Able to use Dyspnea scale daily in rehab to express subjective sense of shortness of breath during exertion;Long Term: Able to use Dyspnea scale to guide intensity level when exercising independently       Knowledge and understanding of Target Heart Rate Range (THRR) Yes       Intervention Provide education and explanation of THRR including how the numbers were predicted and where they are located for reference       Expected Outcomes Short Term: Able to state/look up THRR;Short Term: Able to use daily as guideline for intensity in rehab;Long Term: Able to use THRR to govern intensity when exercising independently       Able to check pulse independently Yes       Intervention Provide education and demonstration on how to check pulse in carotid and radial arteries.;Review the importance of being able to check your own pulse for safety during independent exercise       Expected Outcomes Short Term: Able to explain why pulse checking is important during independent exercise;Long Term: Able to check pulse independently and accurately       Understanding of Exercise Prescription Yes       Intervention Provide education, explanation, and written materials on patient's individual exercise prescription       Expected Outcomes Short Term: Able to explain program exercise prescription;Long Term: Able to explain home exercise prescription to exercise independently          Exercise Goals Re-Evaluation :  Exercise Goals Re-Evaluation     Row Name 08/28/24 1112 09/06/24 1116 09/20/24 1121 10/04/24 0813 10/23/24 1141      Exercise Goal Re-Evaluation   Exercise Goals Review Increase Physical Activity;Able to understand and use rate of perceived exertion (RPE) scale;Knowledge and understanding of Target Heart Rate Range (THRR);Understanding of Exercise Prescription;Able to check pulse independently;Able to understand and use Dyspnea scale;Increase Strength and Stamina Increase Physical Activity;Understanding of Exercise Prescription;Increase Strength and Stamina Increase Physical Activity;Understanding of Exercise Prescription;Increase Strength and Stamina Increase Physical Activity;Understanding of Exercise Prescription;Increase Strength and Stamina Increase Physical Activity;Increase Strength and Stamina;Understanding of Exercise Prescription   Comments Reviewed RPE and dyspnea scale, THR and program prescription with pt today.  Pt voiced understanding and was given a copy of goals to take home. Aileena is off to a good start in the program. She completed her first week of exercise in this review. She was able to work at level 2 on the T4 nustep and level 1 on the XR. She walked 10 laps on the track. We will continue to monitor her progress in the program. Jaylnn is doing  well in rehab. She recently increased to 35 laps on the track. She also improved to level 4 on the T4 nustep and level 3 on the T5 nustep. We will continue to monitor her progress in the program. Jahyra is doing well in rehab. She maintained level 4 on the T4 nustep. She also maintained walking 35 laps on the track. She added the biostep at level 2. We will continue to monitor her progress in the program. Araiya is doing well at rehab, she is not doing any exercise at home. Spoke with her about replacing rehab exercise with independent exercise as she approaches her rehab graduation date.   Expected Outcomes Short: Use RPE daily to regulate intensity. Long: Follow program prescription in THR. Short: Continue to follow current exercise prescription. Long:  Continue exercise to improve strength and stamina. Short: Continue to progressively increase workloads. Long: Continue exercise to improve strength and stamina. Short: Push for more laps on the track. Long: Continue exercise to improve strength and stamina. STG: Look to find indpendent exercise routine. LTG: Continue exercise to improve strength and stamina.    Row Name 10/23/24 1424 10/29/24 1114           Exercise Goal Re-Evaluation   Exercise Goals Review Increase Physical Activity;Increase Strength and Stamina;Understanding of Exercise Prescription Increase Physical Activity;Increase Strength and Stamina;Understanding of Exercise Prescription      Comments Amila was only able to attend two sessions in rehab since the last review. She continues to do well walking on the track and most recently walked 30 laps. She also continues to do well working at level 4 on the T4 nustep and level 2 on the biostep. We will continue to monitor her progress in the program. Arlene is doing well in rehab. She was recently able to increase from level 4 to 5 on the T4 nustep. She was also able to increase from 30 to 34 laps on the track. We will continue to monitor her progress in the program.      Expected Outcomes Short: Return to regular attendence in the program. Long: Continue exercise to improve strength and stamina. Short: Continue to work to increase track laps. Long: Continue exercise to improve strength and stamina.         Discharge Exercise Prescription (Final Exercise Prescription Changes):  Exercise Prescription Changes - 10/29/24 1100       Response to Exercise   Blood Pressure (Admit) 118/64    Blood Pressure (Exit) 116/64    Heart Rate (Admit) 71 bpm    Heart Rate (Exercise) 109 bpm    Heart Rate (Exit) 64 bpm    Rating of Perceived Exertion (Exercise) 11    Symptoms none    Duration Continue with 30 min of aerobic exercise without signs/symptoms of physical distress.    Intensity THRR  unchanged      Progression   Progression Continue to progress workloads to maintain intensity without signs/symptoms of physical distress.    Average METs 2.58      Resistance Training   Weight 3 lb    Reps 10-15      Interval Training   Interval Training No      NuStep   Level 5    Minutes 15      Biostep-RELP   Level 2    Minutes 15    METs 2      Track   Laps 34    Minutes 15    METs 2.85  Oxygen   Maintain Oxygen Saturation 88% or higher          Nutrition:  Target Goals: Understanding of nutrition guidelines, daily intake of sodium 1500mg , cholesterol 200mg , calories 30% from fat and 7% or less from saturated fats, daily to have 5 or more servings of fruits and vegetables.  Education: Nutrition 1 -Group instruction provided by verbal, written material, interactive activities, discussions, models, and posters to present general guidelines for heart healthy nutrition including macronutrients, label reading, and promoting whole foods over processed counterparts. Education serves as pensions consultant of discussion of heart healthy eating for all. Written material provided at class time.    Education: Nutrition 2 -Group instruction provided by verbal, written material, interactive activities, discussions, models, and posters to present general guidelines for heart healthy nutrition including sodium, cholesterol, and saturated fat. Providing guidance of habit forming to improve blood pressure, cholesterol, and body weight. Written material provided at class time.     Biometrics:  Pre Biometrics - 08/23/24 0958       Pre Biometrics   Height 5' 2.2 (1.58 m)    Weight 198 lb 8 oz (90 kg)    Waist Circumference 41.5 inches    Hip Circumference 47 inches    Waist to Hip Ratio 0.88 %    BMI (Calculated) 36.07    Single Leg Stand 14.9 seconds           Nutrition Therapy Plan and Nutrition Goals:  Nutrition Therapy & Goals - 08/23/24 0958       Nutrition  Therapy   RD appointment deferred Yes      Personal Nutrition Goals   Nutrition Goal RD appointment deferred at this time      Intervention Plan   Intervention Prescribe, educate and counsel regarding individualized specific dietary modifications aiming towards targeted core components such as weight, hypertension, lipid management, diabetes, heart failure and other comorbidities.    Expected Outcomes Short Term Goal: Understand basic principles of dietary content, such as calories, fat, sodium, cholesterol and nutrients.          Nutrition Assessments:  MEDIFICTS Score Key: >=70 Need to make dietary changes  40-70 Heart Healthy Diet <= 40 Therapeutic Level Cholesterol Diet  Flowsheet Row Cardiac Rehab from 08/20/2024 in [facility] Cardiac and Pulmonary Rehab  Picture Your Plate Total Score on Admission 47   Picture Your Plate Scores: <59 Unhealthy dietary pattern with much room for improvement. 41-50 Dietary pattern unlikely to meet recommendations for good health and room for improvement. 51-60 More healthful dietary pattern, with some room for improvement.  >60 Healthy dietary pattern, although there may be some specific behaviors that could be improved.    Nutrition Goals Re-Evaluation:  Nutrition Goals Re-Evaluation     Row Name 09/27/24 1130             Goals   Nutrition Goal RD appointment deferred at this time          Nutrition Goals Discharge (Final Nutrition Goals Re-Evaluation):  Nutrition Goals Re-Evaluation - 09/27/24 1130       Goals   Nutrition Goal RD appointment deferred at this time          Psychosocial: Target Goals: Acknowledge presence or absence of significant depression and/or stress, maximize coping skills, provide positive support system. Participant is able to verbalize types and ability to use techniques and skills needed for reducing stress and depression.   Education: Stress, Anxiety, and Depression - Group verbal and visual  presentation  to define topics covered.  Reviews how body is impacted by stress, anxiety, and depression.  Also discusses healthy ways to reduce stress and to treat/manage anxiety and depression. Written material provided at class time.   Education: Sleep Hygiene -Provides group verbal and written instruction about how sleep can affect your health.  Define sleep hygiene, discuss sleep cycles and impact of sleep habits. Review good sleep hygiene tips.   Initial Review & Psychosocial Screening:  Initial Psych Review & Screening - 08/20/24 1343       Initial Review   Current issues with Current Anxiety/Panic;Current Sleep Concerns      Family Dynamics   Good Support System? Yes      Barriers   Psychosocial barriers to participate in program There are no identifiable barriers or psychosocial needs.;The patient should benefit from training in stress management and relaxation.      Screening Interventions   Interventions Encouraged to exercise;Provide feedback about the scores to participant;To provide support and resources with identified psychosocial needs    Expected Outcomes Short Term goal: Utilizing psychosocial counselor, staff and physician to assist with identification of specific Stressors or current issues interfering with healing process. Setting desired goal for each stressor or current issue identified.;Long Term Goal: Stressors or current issues are controlled or eliminated.;Short Term goal: Identification and review with participant of any Quality of Life or Depression concerns found by scoring the questionnaire.;Long Term goal: The participant improves quality of Life and PHQ9 Scores as seen by post scores and/or verbalization of changes          Quality of Life Scores:   Quality of Life - 08/20/24 1351       Quality of Life   Select Quality of Life      Quality of Life Scores   Health/Function Pre 17.6 %    Socioeconomic Pre 23.63 %    Psych/Spiritual Pre 21 %     Family Pre 5.4 %    GLOBAL Pre 17.91 %         Scores of 19 and below usually indicate a poorer quality of life in these areas.  A difference of  2-3 points is a clinically meaningful difference.  A difference of 2-3 points in the total score of the Quality of Life Index has been associated with significant improvement in overall quality of life, self-image, physical symptoms, and general health in studies assessing change in quality of life.  PHQ-9: Review Flowsheet  More data exists      10/23/2024 09/27/2024 08/29/2024 08/23/2024 03/21/2023  Depression screen PHQ 2/9  Decreased Interest 0 0 0 1 0  Down, Depressed, Hopeless 0 0 0 0 0  PHQ - 2 Score 0 0 0 1 0  Altered sleeping 2 2 2 1  0  Tired, decreased energy 2 3 0 3 0  Change in appetite 0 0 0 0 0  Feeling bad or failure about yourself  0 0 0 1 0  Trouble concentrating 0 0 0 1 0  Moving slowly or fidgety/restless 0 0 0 0 0  Suicidal thoughts - 0 0 0 0  PHQ-9 Score 4 5 2 7   0   Difficult doing work/chores Not difficult at all Not difficult at all Not difficult at all Somewhat difficult Not difficult at all    Details       Data saved with a previous flowsheet row definition        Interpretation of Total Score  Total Score Depression Severity:  1-4 = Minimal depression, 5-9 = Mild depression, 10-14 = Moderate depression, 15-19 = Moderately severe depression, 20-27 = Severe depression   Psychosocial Evaluation and Intervention:  Psychosocial Evaluation - 08/20/24 1412       Psychosocial Evaluation & Interventions   Interventions Stress management education;Relaxation education;Encouraged to exercise with the program and follow exercise prescription    Comments 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 She states she still feels short of breath and finds herself more anxious than before. Things like [place] the interstate and feeling unable to twist to merge make her anxious which was not the case prior  to surgery. She also notes her blood sugar has been more elevated than prior to her surgery and thinks it is the stress. She is ready to come to the program to learn more about heart healthy lifestyle    Expected Outcomes Short: attend cardiac rehab for education and exercise Long: develop and maintain positive self care habits    Continue Psychosocial Services  Follow up required by staff          Psychosocial Re-Evaluation:  Psychosocial Re-Evaluation     Row Name 09/27/24 1139 10/23/24 1148           Psychosocial Re-Evaluation   Current issues with Current Stress Concerns Current Sleep Concerns      Comments Patients initial stress came from her lacking stamina. Reviewed patient health questionnaire (PHQ-9) with patient for follow up. Previously, patients score indicated signs/symptoms of depression.  Reviewed to see if patient is improving symptom wise while in program.  Score improved and patient states that it is because she has gained more stamina. Kasyn denies any stress, anxiety, or depression at this time. she says sleep is difficult for her, she often takes naps during the day but stuggles to sleep at night. Spoke with her about relaxation techniques and focusing on sleep hygiene.      Expected Outcomes Short: Continue to attend [facility] regularly for regular exercise and social engagement. Long: Continue to improve symptoms and manage a positive mental state. STG: Continue to attend rehab and focus on good sleep hygiene. LTG: Continue to improve symptoms and manage a positive mental state.      Interventions Encouraged to attend Cardiac Rehabilitation for the exercise Encouraged to attend Cardiac Rehabilitation for the exercise      Continue Psychosocial Services  Follow up required by staff Follow up required by staff         Psychosocial Discharge (Final Psychosocial Re-Evaluation):  Psychosocial Re-Evaluation - 10/23/24 1148       Psychosocial Re-Evaluation   Current  issues with Current Sleep Concerns    Comments Elfrida denies any stress, anxiety, or depression at this time. she says sleep is difficult for her, she often takes naps during the day but stuggles to sleep at night. Spoke with her about relaxation techniques and focusing on sleep hygiene.    Expected Outcomes STG: Continue to attend rehab and focus on good sleep hygiene. LTG: Continue to improve symptoms and manage a positive mental state.    Interventions Encouraged to attend Cardiac Rehabilitation for the exercise    Continue Psychosocial Services  Follow up required by staff          Vocational Rehabilitation: Provide vocational rehab assistance to qualifying candidates.   Vocational Rehab Evaluation & Intervention:  Vocational Rehab - 08/20/24 1343       Initial Vocational Rehab Evaluation & Intervention   Assessment  shows need for Vocational Rehabilitation No          Education: Education Goals: Education classes will be provided on a variety of topics geared toward better understanding of heart health and risk factor modification. Participant will state understanding/return demonstration of topics presented as noted by education test scores.  Learning Barriers/Preferences:  Learning Barriers/Preferences - 08/20/24 1337       Learning Barriers/Preferences   Learning Barriers None    Learning Preferences None          General Cardiac Education Topics:  AED/CPR: - Group verbal and written instruction with the use of models to demonstrate the basic use of the AED with the basic ABC's of resuscitation.   Test and Procedures: - Group verbal and visual presentation and models provide information about basic cardiac anatomy and function. Reviews the testing methods done to diagnose heart disease and the outcomes of the test results. Describes the treatment choices: Medical Management, Angioplasty, or Coronary Bypass Surgery for treating various heart conditions including  Myocardial Infarction, Angina, Valve Disease, and Cardiac Arrhythmias. Written material provided at class time. Flowsheet Row Cardiac Rehab from 08/23/2024 in [facility] Cardiac and Pulmonary Rehab  Education need identified 08/23/24    Medication Safety: - Group verbal and visual instruction to review commonly prescribed medications for heart and lung disease. Reviews the medication, class of the drug, and side effects. Includes the steps to properly store meds and maintain the prescription regimen. Written material provided at class time.   Intimacy: - Group verbal instruction through game format to discuss how heart and lung disease can affect sexual intimacy. Written material provided at class time.   Know Your Numbers and Heart Failure: - Group verbal and visual instruction to discuss disease risk factors for cardiac and pulmonary disease and treatment options.  Reviews associated critical values for Overweight/Obesity, Hypertension, Cholesterol, and Diabetes.  Discusses basics of heart failure: signs/symptoms and treatments.  Introduces Heart Failure Zone chart for action plan for heart failure. Written material provided at class time. Flowsheet Row Cardiac Rehab from 08/23/2024 in [facility] Cardiac and Pulmonary Rehab  Education need identified 08/23/24    Infection Prevention: - Provides verbal and written material to individual with discussion of infection control including proper hand washing and proper equipment cleaning during exercise session. Flowsheet Row Cardiac Rehab from 08/23/2024 in [facility] Cardiac and Pulmonary Rehab  Date 08/23/24  Educator MB  Instruction Review Code 1- Verbalizes Understanding    Falls Prevention: - Provides verbal and written material to individual with discussion of falls prevention and safety. Flowsheet Row Cardiac Rehab from 08/23/2024 in [facility] Cardiac and Pulmonary Rehab  Date 08/23/24  Educator MB  Instruction Review Code 1- Verbalizes Understanding     Other: -Provides group and verbal instruction on various topics (see comments)   Knowledge Questionnaire Score:  Knowledge Questionnaire Score - 08/20/24 1351       Knowledge Questionnaire Score   Pre Score 22/26          Core Components/Risk Factors/Patient Goals at Admission:  Personal Goals and Risk Factors at Admission - 08/23/24 0959       Core Components/Risk Factors/Patient Goals on Admission    Weight Management Yes;Weight Maintenance   Does not like to set specific weight goal, focuses on healthy lifestyle   Intervention Weight Management: Develop a combined nutrition and exercise program designed to reach desired caloric intake, while maintaining appropriate intake of nutrient and fiber, sodium and fats, and appropriate energy expenditure required for the  weight goal.;Weight Management: Provide education and appropriate resources to help participant work on and attain dietary goals.;Weight Management/Obesity: Establish reasonable short term and long term weight goals.    Expected Outcomes Short Term: Continue to assess and modify interventions until short term weight is achieved;Long Term: Adherence to nutrition and physical activity/exercise program aimed toward attainment of established weight goal;Understanding recommendations for meals to include 15-35% energy as protein, 25-35% energy from fat, 35-60% energy from carbohydrates, less than 200mg  of dietary cholesterol, 20-35 gm of total fiber daily;Understanding of distribution of calorie intake throughout the day with the consumption of 4-5 meals/snacks;Weight Maintenance: Understanding of the daily nutrition guidelines, which includes 25-35% calories from fat, 7% or less cal from saturated fats, less than 200mg  cholesterol, less than 1.5gm of sodium, & 5 or more servings of fruits and vegetables daily    Diabetes Yes    Intervention Provide education about signs/symptoms and action to take for hypo/hyperglycemia.;Provide  education about proper nutrition, including hydration, and aerobic/resistive exercise prescription along with prescribed medications to achieve blood glucose in normal ranges: Fasting glucose 65-99 mg/dL    Expected Outcomes Short Term: Participant verbalizes understanding of the signs/symptoms and immediate care of hyper/hypoglycemia, proper foot care and importance of medication, aerobic/resistive exercise and nutrition plan for blood glucose control.;Long Term: Attainment of HbA1C < 7%.    Hypertension Yes    Intervention Provide education on lifestyle modifcations including regular physical activity/exercise, weight management, moderate sodium restriction and increased consumption of fresh fruit, vegetables, and low fat dairy, alcohol moderation, and smoking cessation.;Monitor prescription use compliance.    Expected Outcomes Short Term: Continued assessment and intervention until BP is < 140/50mm HG in hypertensive participants. < 130/45mm HG in hypertensive participants with diabetes, heart failure or chronic kidney disease.;Long Term: Maintenance of blood pressure at goal levels.    Lipids Yes    Intervention Provide education and support for participant on nutrition & aerobic/resistive exercise along with prescribed medications to achieve LDL 70mg , HDL >40mg .    Expected Outcomes Short Term: Participant states understanding of desired cholesterol values and is compliant with medications prescribed. Participant is following exercise prescription and nutrition guidelines.;Long Term: Cholesterol controlled with medications as prescribed, with individualized exercise RX and with personalized nutrition plan. Value goals: LDL < 70mg , HDL > 40 mg.          Education:Diabetes - Individual verbal and written instruction to review signs/symptoms of diabetes, desired ranges of glucose level fasting, after meals and with exercise. Acknowledge that pre and post exercise glucose checks will be done for 3  sessions at entry of program. Flowsheet Row Cardiac Rehab from 08/23/2024 in [facility] Cardiac and Pulmonary Rehab  Date 08/23/24  Educator MB  Instruction Review Code 1- Verbalizes Understanding    Core Components/Risk Factors/Patient Goals Review:   Goals and Risk Factor Review     Row Name 09/27/24 1132 10/23/24 1151           Core Components/Risk Factors/Patient Goals Review   Personal Goals Review Diabetes;Hypertension;Lipids Diabetes;Hypertension      Review Makynleigh has been doing better with her diabetes. Her average blood sugars have been around 168. She wants to focus more on lifestyle rather than a weight goal. Her blood pressure has been good and she is checking her vitals at home. Her lipids are doing better and is taking Ezitamibe rather than her old statins that made her have memory issues. She wants to work on getting her stamina back while she is in the  program. Chelsia can check her blood sugar at home with her [place] 3. Chaya is checking her BP at home and is happy with her blood sugars lately. SHe checks her bP at home as well and reports her readings match closely with the readings taken here at rehab.      Expected Outcomes ShortL continue to check blood pressure and sugars at home. Long: maintain blood pressure and blood sugar independently. STG: continue to check blood pressure and sugars at home. LTG: maintain blood pressure and blood sugar independently.         Core Components/Risk Factors/Patient Goals at Discharge (Final Review):   Goals and Risk Factor Review - 10/23/24 1151       Core Components/Risk Factors/Patient Goals Review   Personal Goals Review Diabetes;Hypertension    Review Jacelynn is checking her BP at home and is happy with her blood sugars lately. SHe checks her bP at home as well and reports her readings match closely with the readings taken here at rehab.    Expected Outcomes STG: continue to check blood pressure and sugars at home. LTG: maintain blood  pressure and blood sugar independently.          ITP Comments:  ITP Comments     Row Name 08/20/24 1410 08/23/24 0953 08/28/24 1112 09/05/24 1048 10/03/24 0814   ITP Comments Initial phone call completed. Diagnosis can be found in [facility] 9/18. EP Orientation scheduled for Thursday 11/6 at 8am. Completed and gym orientation for cardiac rehab. Initial ITP created and sent for review to Dr. Oneil Pinal, Medical Director. First full day of exercise!  Patient was oriented to gym and equipment including functions, settings, policies, and procedures.  Patient's individual exercise prescription and treatment plan were reviewed.  All starting workloads were established based on the results of the 6 minute walk test done at initial orientation visit.  The plan for exercise progression was also introduced and progression will be customized based on patient's performance and goals. 30 Day review completed. Medical Director ITP review done, changes made as directed, and signed approval by Medical Director. New to program 30 Day review completed. Medical Director ITP review done, changes made as directed, and signed approval by Medical Director.    Row Name 10/31/24 1020           ITP Comments 30 Day review completed. Medical Director ITP review done, changes made as directed, and signed approval by Medical Director.          Comments: 30 Day Review     [1]  Current Outpatient Medications:    albuterol  (VENTOLIN  HFA) 108 (90 Base) MCG/ACT inhaler, Inhale 2 puffs into the lungs every 6 (six) hours as needed for wheezing or shortness of breath., Disp: 8 g, Rfl: 2   amiodarone  (PACERONE ) 200 MG tablet, Take 0.5 tablets (100 mg total) by mouth daily. (Patient taking differently: Take 100 mg by mouth daily. Thru 10/08/2024), Disp: , Rfl:    amLODipine  (NORVASC ) 10 MG tablet, Take 1 tablet (10 mg total) by mouth daily., Disp: 90 tablet, Rfl: 3   aspirin  EC 325 MG tablet, Take 1 tablet (325 mg total) by  mouth daily., Disp: , Rfl:    benazepril  (LOTENSIN ) 40 MG tablet, Take 1 tablet (40 mg total) by mouth daily., Disp: 90 tablet, Rfl: 3   Blood Glucose Monitoring Suppl (CONTOUR NEXT USB MONITOR) w/Device KIT, 1 kit by Does not apply route daily. To check blood sugar once daily., Disp: 1  kit, Rfl: 0   CareTouch Safety Lancets 26G MISC, 1 Device by Does not apply route daily. To check blood sugar once daily., Disp: 100 each, Rfl: 12   cetirizine  (ZYRTEC ) 10 MG tablet, Take 1 tablet by mouth once daily, Disp: 90 tablet, Rfl: 0   Continuous Glucose Sensor (FREESTYLE LIBRE 3 PLUS SENSOR) MISC, USE AS DIRECTED. CHANGE SENSOR EVERY 15 DAYS., Disp: 6 each, Rfl: 1   dapagliflozin  propanediol (FARXIGA ) 10 MG TABS tablet, Take 1 tablet (10 mg total) by mouth daily. For 2025 [place], Disp: 90 tablet, Rfl: 3   ezetimibe  (ZETIA ) 10 MG tablet, Take 1 tablet (10 mg total) by mouth daily., Disp: 90 tablet, Rfl: 0   fluticasone -salmeterol (WIXELA INHUB) 100-50 MCG/ACT AEPB, Inhale 1 puff into the lungs 2 (two) times daily., Disp: 1 each, Rfl: 3   glucose blood (CONTOUR NEXT TEST) test strip, To check blood sugar once daily., Disp: 100 each, Rfl: 5   hydrochlorothiazide  (HYDRODIURIL ) 12.5 MG tablet, Take 1 tablet (12.5 mg total) by mouth daily., Disp: 90 tablet, Rfl: 3   insulin  aspart (NOVOLOG ) 100 UNIT/ML FlexPen, Inject 4 to 10 units up to 3 times a day with a meal., Disp: 15 mL, Rfl: 0   insulin  degludec ([name]  FLEXTOUCH) 100 UNIT/ML FlexTouch Pen, Inject 15 Units into the skin daily. (Patient taking differently: Inject 23 Units into the skin daily.), Disp: , Rfl:    Insulin  Pen Needle (NOVOFINE PLUS) 32G X 4 MM MISC, To use with ozempic  injections, Disp: 100 each, Rfl: 3   metoprolol  tartrate (LOPRESSOR ) 25 MG tablet, Take 1.5 tablets (37.5 mg total) by mouth 2 (two) times daily., Disp: 90 tablet, Rfl: 3   montelukast  (SINGULAIR ) 10 MG tablet, TAKE 1 TABLET BY MOUTH AT BEDTIME, Disp: 90  tablet, Rfl: 0   omeprazole  (PRILOSEC  OTC) 20 MG tablet, Take 1 tablet (20 mg total) by mouth daily., Disp: 90 tablet, Rfl: 1 [2]  Social History Tobacco Use  Smoking Status Never  Smokeless Tobacco Never

## 2024-11-01 ENCOUNTER — Encounter: Admitting: Emergency Medicine

## 2024-11-01 DIAGNOSIS — Z952 Presence of prosthetic heart valve: Secondary | ICD-10-CM

## 2024-11-01 DIAGNOSIS — Z48812 Encounter for surgical aftercare following surgery on the circulatory system: Secondary | ICD-10-CM | POA: Diagnosis not present

## 2024-11-01 NOTE — Progress Notes (Signed)
 Daily Session Note  Patient Details  Name: Shari Lee MRN: [id number] Date of Birth: Dec 26, 1955 Referring Provider:   Flowsheet Row Cardiac Rehab from 08/23/2024 in [facility] Cardiac and Pulmonary Rehab  Referring Provider Darliss Rogue, MD    Encounter Date: 11/01/2024  Check In:  Session Check In - 11/01/24 1108       Check-In   Supervising physician immediately available to respond to emergencies See telemetry face sheet for immediately available ER MD    Location [facility]-Cardiac & Pulmonary Rehab    Staff Present Fairy Plater RCP,RRT,BSRT;Maxon Conetta BS, Exercise Physiologist;Letizia Hook RN,BSN;Noah Tickle, BS, Exercise Physiologist    Virtual Visit No    Medication changes reported     No    Fall or balance concerns reported    No    Warm-up and Cool-down Performed on first and last piece of equipment    Resistance Training Performed Yes    VAD Patient? No    PAD/SET Patient? No      Pain Assessment   Currently in Pain? No/denies             Tobacco Use History[1]  Goals Met:  Independence with exercise equipment Exercise tolerated well No report of concerns or symptoms today Strength training completed today  Goals Unmet:  Not Applicable  Comments: Pt able to follow exercise prescription today without complaint.  Will continue to monitor for progression.    Dr. Oneil Pinal is Medical Director for [facility] Cardiac Rehabilitation.  Dr. Fuad Aleskerov is Medical Director for [facility] Pulmonary Rehabilitation.    [1]  Social History Tobacco Use  Smoking Status Never  Smokeless Tobacco Never

## 2024-11-03 ENCOUNTER — Other Ambulatory Visit: Payer: Self-pay | Admitting: Cardiology

## 2024-11-06 ENCOUNTER — Encounter

## 2024-11-06 DIAGNOSIS — Z952 Presence of prosthetic heart valve: Secondary | ICD-10-CM

## 2024-11-06 DIAGNOSIS — Z48812 Encounter for surgical aftercare following surgery on the circulatory system: Secondary | ICD-10-CM | POA: Diagnosis not present

## 2024-11-06 NOTE — Progress Notes (Signed)
 Daily Session Note  Patient Details  Name: Shari Lee MRN: [id number] Date of Birth: 1956/02/18 Referring Provider:   Flowsheet Row Cardiac Rehab from 08/23/2024 in [facility] Cardiac and Pulmonary Rehab  Referring Provider Darliss Rogue, MD    Encounter Date: 11/06/2024  Check In:  Session Check In - 11/06/24 1113       Check-In   Supervising physician immediately available to respond to emergencies See telemetry face sheet for immediately available ER MD    Location [facility]-Cardiac & Pulmonary Rehab    Staff Present Burnard Davenport RN,BSN,MPA;Meredith Tressa RN,BSN;Maxon Conetta BS, Exercise Physiologist;Jason Elnor [facility]    Virtual Visit No    Medication changes reported     No    Fall or balance concerns reported    No    Warm-up and Cool-down Performed on first and last piece of equipment    Resistance Training Performed Yes    VAD Patient? No    PAD/SET Patient? No      Pain Assessment   Currently in Pain? No/denies             Tobacco Use History[1]  Goals Met:  Independence with exercise equipment Exercise tolerated well No report of concerns or symptoms today Strength training completed today  Goals Unmet:  Not Applicable  Comments: Pt able to follow exercise prescription today without complaint.  Will continue to monitor for progression.    Dr. Oneil Pinal is Medical Director for [facility] Cardiac Rehabilitation.  Dr. Fuad Aleskerov is Medical Director for [facility] Pulmonary Rehabilitation.    [1]  Social History Tobacco Use  Smoking Status Never  Smokeless Tobacco Never

## 2024-11-08 ENCOUNTER — Encounter: Admitting: Emergency Medicine

## 2024-11-08 ENCOUNTER — Ambulatory Visit: Attending: Cardiology | Admitting: Cardiology

## 2024-11-08 ENCOUNTER — Encounter: Payer: Self-pay | Admitting: Cardiology

## 2024-11-08 VITALS — BP 110/60 | HR 60 | Ht 62.0 in | Wt 212.2 lb

## 2024-11-08 DIAGNOSIS — E1169 Type 2 diabetes mellitus with other specified complication: Secondary | ICD-10-CM | POA: Diagnosis not present

## 2024-11-08 DIAGNOSIS — Z794 Long term (current) use of insulin: Secondary | ICD-10-CM

## 2024-11-08 DIAGNOSIS — E118 Type 2 diabetes mellitus with unspecified complications: Secondary | ICD-10-CM | POA: Diagnosis not present

## 2024-11-08 DIAGNOSIS — E785 Hyperlipidemia, unspecified: Secondary | ICD-10-CM | POA: Diagnosis not present

## 2024-11-08 DIAGNOSIS — Z952 Presence of prosthetic heart valve: Secondary | ICD-10-CM | POA: Diagnosis not present

## 2024-11-08 DIAGNOSIS — Z48812 Encounter for surgical aftercare following surgery on the circulatory system: Secondary | ICD-10-CM | POA: Diagnosis not present

## 2024-11-08 DIAGNOSIS — I9789 Other postprocedural complications and disorders of the circulatory system, not elsewhere classified: Secondary | ICD-10-CM

## 2024-11-08 DIAGNOSIS — I2510 Atherosclerotic heart disease of native coronary artery without angina pectoris: Secondary | ICD-10-CM

## 2024-11-08 DIAGNOSIS — I350 Nonrheumatic aortic (valve) stenosis: Secondary | ICD-10-CM | POA: Diagnosis not present

## 2024-11-08 DIAGNOSIS — R0602 Shortness of breath: Secondary | ICD-10-CM

## 2024-11-08 DIAGNOSIS — Z79899 Other long term (current) drug therapy: Secondary | ICD-10-CM | POA: Diagnosis not present

## 2024-11-08 DIAGNOSIS — I152 Hypertension secondary to endocrine disorders: Secondary | ICD-10-CM | POA: Diagnosis not present

## 2024-11-08 DIAGNOSIS — E1159 Type 2 diabetes mellitus with other circulatory complications: Secondary | ICD-10-CM

## 2024-11-08 DIAGNOSIS — I4891 Unspecified atrial fibrillation: Secondary | ICD-10-CM

## 2024-11-08 NOTE — Patient Instructions (Signed)
 Medication Instructions:   Stop Amiodarone  200 MG daily.  *If you need a refill on your cardiac medications before your next appointment, please call your pharmacy*  Lab Work: Your provider would like for you to have following labs drawn today BNP, CMP, CBC and TSH.   If you have labs (blood work) drawn today and your tests are completely normal, you will receive your results only by: MyChart Message (if you have MyChart) OR A paper copy in the mail If you have any lab test that is abnormal or we need to change your treatment, we will call you to review the results.  Testing/Procedures: No test ordered today   Follow-Up: At [facility], you and your health needs are our priority.  As part of our continuing mission to provide you with exceptional heart care, our providers are all part of one team.  This team includes your primary Cardiologist (physician) and Advanced Practice Providers or APPs (Physician Assistants and Nurse Practitioners) who all work together to provide you with the care you need, when you need it.  Your next appointment:   6 week(s)  Provider:   You may see Redell Cave, MD or one of the following Advanced Practice Providers on your designated Care Team:   Lonni Meager, NP Lesley Maffucci, PA-C Bernardino Bring, PA-C Cadence Rosenberg, PA-C Tylene Lunch, NP Barnie Hila, NP    We recommend signing up for the patient portal called MyChart.  Sign up information is provided on this After Visit Summary.  MyChart is used to connect with patients for Virtual Visits (Telemedicine).  Patients are able to view lab/test results, encounter notes, upcoming appointments, etc.  Non-urgent messages can be sent to your provider as well.   To learn more about what you can do with MyChart, go to [email or web address].

## 2024-11-08 NOTE — Progress Notes (Signed)
 " Cardiology Office Note   Date:  11/08/2024  ID:  Shari Lee, DOB 1956-01-04, MRN [id number] PCP: Almarie Waddell NOVAK, NP  [facility] Providers Cardiologist:  Redell Cave, MD Cardiology APP:  Gerard Frederick, NP     History of Present Illness Shari Lee is a 69 y.o. female with a past medical history of primary hypertension, type 2 diabetes, severe aortic stenosis, bicuspid aortic valve status post TAVR, heart murmur, presents today for follow-up.   She was seen in clinic 02/11/2022 by Dr.Agbor-Etang.  She was.  Echocardiogram she had previously been evaluated by her PCP and was noted to have a cardiac murmur.  Echocardiogram was obtained to evaluate any structural abnormalities.  Patient clinically was asymptomatic denying chest pain shortness of breath or palpitations.  Echocardiogram revealing normal EF 60 to 65%, moderate aortic valve stenosis with mean gradient 25 mmHg recommendation was for repeat echo yearly.   She was seen in clinic on/16/25 stating that overall from a cardiac perspective she had been doing well.  She did have a 30 pound weight loss but states that she ended up with pancreatitis from Ozempic .  She was scheduled for an updated echocardiogram for aortic valve stenosis.  There were no further medication changes that were made.  She was last seen in clinic 03/13/2024 with exertional dyspnea, occasional lightheadedness and dizziness when getting out of bed but denies any syncope or near syncope.  She also noted increased fatigue with activity.  Echocardiogram completed on 5/19 revealed an LVEF of 60 to 65%, no RWMA, mild LVH, G1 DD, right ventricular systolic function was normal and right ventricular size was normal, bicuspid aortic valve noted aortic regurgitation was not visualized but severe aortic valve stenosis with aortic valve area by VTI measuring 0.58 cm and aortic valve mean gradient measuring 48 mmHg.  She was scheduled for a left heart catheterization  for further evaluation of aortic valve stenosis and coronary artery disease with a referral to structural team.  Her left heart catheterization showed a proximal LAD lesion that was 10% stenosis, minor irregularities with no evidence of obstructive coronary artery disease, right heart catheterization showed normal right and left-sided filling pressures, normal pulmonary pressure and normal cardiac output.  She was seen in clinic 07/03/2024 by Dr. Shlomo.  At that time she was referred for TAVR protocol CTA and cardiothoracic surgical opinion.  She was started on aspirin  81 mg daily and continued on ezetimibe  10 mg daily and to continue recommendations of considering statin therapy.  No other medication changes were made at that time.  She was evaluated by Dr. Shyrl on 06/15/2024 for severe aortic stenosis.  After testing was reviewed and symptoms were evaluated due to her bicuspid aortic valve the risks and benefits of bioprosthetic AVR were discussed in detail and patient was agreeable to proceed.  Her procedure was then scheduled.  She was hospitalized at [facility] from 9/18 - 07/07/2024 for severe aortic stenosis and underwent an elective aortic valve replacement.  She had a bioprosthetic valve that was placed.  Postoperative hospital course she did well.  She developed postoperative atrial fibrillation and was subsequently chemically cardioverted with amiodarone .  All routine lines, monitors, and drainage devices were discontinued as anticipated.  She did have expected acute blood loss anemia which stabilized.  She maintained normal renal function.  There was a noted reactive leukocytosis that was monitored clinically.  She was seen by physical therapy to assist with postoperative mobilization due to deconditioning.  She  made slow but steady improvement in that regard.  She did develop a phlebitis associated with amiodarone  infusion with subsequent development of a mild cellulitis.  She was  started on Keflex  for the cellulitis.  Her blood pressure was difficult to control and her home medications were uptitrated to higher dosing.  She was also started on hydrochlorothiazide .  She was discharged home on her diabetic regimen.  Overall at the time of discharge she was felt to be quite stable.  She was to be referred to cardiac rehab.  She was to stop atenolol  continue with amiodarone , amlodipine , aspirin , benazepril , Farxiga , ezetimibe , hydrochlorothiazide , metoprolol  tartrate.  Follow-up 6 week post op echocardiogram was ordered.   She was last seen in clinic 07/26/2024 accompanied by family member.  Overall she had been doing well from a cardiac perspective.  She denied any chest pain and primarily had incisional pain.  Her incisions were healing well.  She was sent for labs and discussed cardiac rehab.  She was advised she would have to be cleared from surgery prior to starting rehab.  Follow-up labs her serum creatinine was elevated furosemide  was stopped with an additional repeat BMP ordered.  She was restarted on amiodarone  200 mg daily for the minimum recommendation of 3 months due to postoperative atrial fibrillation.  She was encouraged to maintain all follow-ups with CT surgery and structural for her 60-month scheduled echocardiogram.  There were no other medication changes that were made and further testing that was ordered at the time.  She was last seen in clinic 09/06/2024 continue to have dyspnea on exertion.  She had been prescribed 2 separate inhalers but on a fixed income was unable to afford them.  Amiodarone  was decreased from 200 mg daily to 100 mg daily.  There was no reoccurrence of atrial fibrillation as she had postoperative A-fib.  No other medication changes were made or further testing was ordered at that time.    She returns to clinic today stating that she continues to suffer from shortness of breath with any amount of activity.  States that she has been compliant with  her current medication regimen without any undue side effects.  She has been participating in cardiac rehab.  Notes some mild swelling to her bilateral lower extremities and weight gain.  Denies any hospitalizations or visits to the emergency department.  ROS: 10 point review of systems has been reviewed and considered negative exception was listed in the HPI  Studies Reviewed      2d echo 08/17/2024 1. Left ventricular ejection fraction, by estimation, is 60 to 65%. Left  ventricular ejection fraction by 3D volume is 65 %. The left ventricle has  normal function. The left ventricle has no regional wall motion  abnormalities. There is moderate  concentric left ventricular hypertrophy. Left ventricular diastolic  parameters are consistent with Grade I diastolic dysfunction (impaired  relaxation).   2. Right ventricular systolic function is normal. The right ventricular  size is normal. Tricuspid regurgitation signal is inadequate for assessing  PA pressure.   3. The mitral valve is normal in structure. Trivial mitral valve  regurgitation. No evidence of mitral stenosis.   4. The aortic valve has been repaired/replaced. Aortic valve  regurgitation is not visualized. No aortic stenosis is present. There is a  21 mm Inspiris bioprosthetic valve present in the aortic position.  Procedure Date: 07/05/2024. Aortic valve area, by  VTI measures 2.05 cm. Aortic valve mean gradient measures 13.0 mmHg.  Aortic valve Vmax  measures 2.38 m/s. Normal function of AV bioprosthesis.   5. The inferior vena cava is normal in size with greater than 50%  respiratory variability, suggesting right atrial pressure of 3 mmHg.    TEE 07/05/2024 POST-OP IMPRESSIONS  _ Left Ventricle: has hyperdynamic systolic function, with an ejection  fraction  of 70%. The cavity size was decreased. The wall motion is normal.  _ Right Ventricle: normal function. The wall motion is normal.  _ Aorta: there is no dissection  present in the aorta.  _ Aortic Valve: A bioprosthetic bioprosthetic valve was placed, leaflets  are  freely mobile and leaflets thin. No regurgitation post repair. The  gradient  recorded across the prosthetic valve is within the expected range,  measuring 10  cm/s. No perivalvular leak noted.  _ Mitral Valve: There is mild regurgitation.  _ Tricuspid Valve: There is trivial regurgitation.   PRE-OP FINDINGS   Left Ventricle: The left ventricle has hyperdynamic systolic function,  with an ejection fraction of >65%. The cavity size was decreased. No  evidence of left ventricular regional wall motion abnormalities. There is  severe concentric left ventricular  hypertrophy.    LHC 03/26/2024   Prox LAD lesion is 10% stenosed.   1.  Minor irregularities with no evidence of obstructive coronary artery disease. 2.  Severe aortic stenosis by echo.  I did not attempt to cross the aortic valve. 3.  Right heart catheterization showed normal right and left-sided filling pressures, normal pulmonary pressure and normal cardiac output.   Recommendations: Refer to the structural heart clinic for evaluation of bicuspid aortic valve replacement.   2D echo 03/05/2024 1. Left ventricular ejection fraction, by estimation, is 60 to 65%. Left  ventricular ejection fraction by PLAX is 63 %. The left ventricle has  normal function. The left ventricle has no regional wall motion  abnormalities. There is mild left ventricular  hypertrophy. Left ventricular diastolic parameters are consistent with  Grade I diastolic dysfunction (impaired relaxation).   2. Right ventricular systolic function is normal. The right ventricular  size is normal.   3. The mitral valve is grossly normal. No evidence of mitral valve  regurgitation.   4. The aortic valve is bicuspid. Aortic valve regurgitation is not  visualized. Severe aortic valve stenosis. Aortic valve area, by VTI  measures 0.58 cm. Aortic valve mean gradient  measures 48.0 mmHg. Aortic  valve Vmax measures 4.92 m/s.   5. The inferior vena cava is normal in size with greater than 50%  respiratory variability, suggesting right atrial pressure of 3 mmHg.    2D echo 01/27/2022 1. Left ventricular ejection fraction, by estimation, is 60 to 65%. The  left ventricle has normal function. The left ventricle has no regional  wall motion abnormalities. There is moderate left ventricular hypertrophy.  Left ventricular diastolic  parameters are consistent with Grade I diastolic dysfunction (impaired  relaxation). The average left ventricular global longitudinal strain is  -17.5 %. The global longitudinal strain is normal.   2. Right ventricular systolic function is normal. The right ventricular  size is normal.   3. Left atrial size was mildly dilated.   4. The mitral valve is normal in structure. No evidence of mitral valve  regurgitation. No evidence of mitral stenosis.   5. The aortic valve is normal in structure. There is moderate  calcification of the aortic valve. Aortic valve regurgitation is not  visualized. Moderate aortic valve stenosis. Aortic valve area, by VTI  measures 0.82 cm. Aortic  valve mean gradient  measures 25.3 mmHg. Aortic valve Vmax measures 3.36 m/s.   6. The inferior vena cava is normal in size with greater than 50%  respiratory variability, suggesting right atrial pressure of 3 mmHg.   Risk Assessment/Calculations           Physical Exam VS:  BP 110/60 (BP Location: Left Arm, Patient Position: Sitting, Cuff Size: Large)   Pulse 60   Ht 5' 2 (1.575 m)   Wt 212 lb 4 oz (96.3 kg)   SpO2 99%   BMI 38.82 kg/m        Wt Readings from Last 3 Encounters:  11/08/24 212 lb 4 oz (96.3 kg)  09/06/24 200 lb 6.4 oz (90.9 kg)  08/29/24 202 lb (91.6 kg)    GEN: Well nourished, well developed in no acute distress NECK: No JVD; No carotid bruits CARDIAC: RRR, I/VI systolic murmur RUSB, without rubs or gallops RESPIRATORY:   Clear to auscultation without rales, wheezing or rhonchi  ABDOMEN: Soft, non-tender, non-distended EXTREMITIES: Trace pretibial edema bilaterally; No deformity   ASSESSMENT AND PLAN Severe aortic stenosis/bicuspid aortic valve status post TAVR.  She has done well from a cardiac perspective postoperative.  Recent echocardiogram revealed LVEF 60 to 65%, no RWMA, G1 DD, and trivial mitral regurgitation and aortic valve replacements in the appropriate position.  She was released from CVTS and has been participating in cardiac rehab as recommended.  She will need an additional echocardiogram 1 year from initial replacement which will be September 2026.  Also reminded about SBE prophylaxis prior to dental or minor procedures.  On chronic dyspnea shortness of breath likely secondary to deconditioning that is multifactorial.  She has been prescribed multiple inhalers which she states were too expensive.  Echocardiogram revealed no changes.  She does have some trace pretibial edema with the worsening shortness of breath today.  Amiodarone  has been discontinued as she has been on for over 3 months since her postoperative episode of atrial fibrillation.  She has been sent for updated labs today of CBC to rule out anemia, BNP to rule out heart failure, CMP to check electrolytes and thyroid  since she has been on amiodarone .  Postoperative atrial fibrillation where she has been maintaining sinus rhythm.  TSH has been rechecked today amiodarone  is being stopped as she has completed her 32-month course of amiodarone .  She is continued on metoprolol  to tartrate 37.5 mg twice daily.  She is not currently on OAC due to being a isolated incident without reoccurrence.  She has been encouraged to continue to monitor her heart rate with her Fitbit.  Nonobstructive coronary artery disease with prior proximal LAD lesion LAD was 10% with moderate irregularities without evidence of obstructive coronary artery disease.  She has been  continued on aspirin  325 mg daily and ezetimibe  10 mg daily.  No further ischemic evaluation needed at this time.  Primary hypertension with a blood pressure today 110/60.  Blood pressure has remained stable.  She is continued on amlodipine  10 mg daily hydrochlorothiazide  12.5 mg daily metoprolol  tartrate 37.5 mg twice daily and benazepril  40 mg daily.  She has been encouraged to continue to monitor pressures 1 to 2 hours postmedication administration at home as well.  Mixed hyperlipidemia with a last LDL of 110.  Goal is ideally 55 or less with a history of diabetes.  Ongoing management per PCP.  Type 2 diabetes which is continued on current medication regimen without any changes today ongoing management per PCP.  Dispo: Patient to return to clinic to see MD/APP in 6-8 weeks or sooner if needed for further evaluation  Signed, Janecia Palau, NP   "

## 2024-11-08 NOTE — Progress Notes (Signed)
 Daily Session Note  Patient Details  Name: Shari Lee MRN: [id number] Date of Birth: Oct 27, 1955 Referring Provider:   Flowsheet Row Cardiac Rehab from 08/23/2024 in [facility] Cardiac and Pulmonary Rehab  Referring Provider Darliss Rogue, MD    Encounter Date: 11/08/2024  Check In:  Session Check In - 11/08/24 1133       Check-In   Supervising physician immediately available to respond to emergencies See telemetry face sheet for immediately available ER MD    Location [facility]-Cardiac & Pulmonary Rehab    Staff Present Leita Franks RN,BSN;[facility] RCP,RRT,BSRT;Noah Tickle, [place], Exercise Physiologist;Jason Elnor [facility]    Virtual Visit No    Medication changes reported     No    Fall or balance concerns reported    No    Warm-up and Cool-down Performed on first and last piece of equipment    Resistance Training Performed Yes    VAD Patient? No    PAD/SET Patient? No      Pain Assessment   Currently in Pain? No/denies             Tobacco Use History[1]  Goals Met:  Independence with exercise equipment Exercise tolerated well No report of concerns or symptoms today Strength training completed today  Goals Unmet:  Not Applicable  Comments: Pt able to follow exercise prescription today without complaint.  Will continue to monitor for progression.    Dr. Oneil Pinal is Medical Director for [facility] Cardiac Rehabilitation.  Dr. Fuad Aleskerov is Medical Director for [facility] Pulmonary Rehabilitation.    [1]  Social History Tobacco Use  Smoking Status Never  Smokeless Tobacco Never

## 2024-11-09 ENCOUNTER — Other Ambulatory Visit: Payer: Self-pay | Admitting: Family Medicine

## 2024-11-09 ENCOUNTER — Ambulatory Visit: Payer: Self-pay | Admitting: Cardiology

## 2024-11-09 DIAGNOSIS — Z79899 Other long term (current) drug therapy: Secondary | ICD-10-CM

## 2024-11-10 LAB — COMPREHENSIVE METABOLIC PANEL WITH GFR
ALT: 15 [IU]/L (ref 0–32)
AST: 17 [IU]/L (ref 0–40)
Albumin: 4.3 g/dL (ref 3.9–4.9)
Alkaline Phosphatase: 111 [IU]/L (ref 49–135)
BUN/Creatinine Ratio: 18 (ref 12–28)
BUN: 29 mg/dL — ABNORMAL HIGH (ref 8–27)
Bilirubin Total: 0.2 mg/dL (ref 0.0–1.2)
CO2: 19 mmol/L — ABNORMAL LOW (ref 20–29)
Calcium: 9.4 mg/dL (ref 8.7–10.3)
Chloride: 97 mmol/L (ref 96–106)
Creatinine, Ser: 1.65 mg/dL — ABNORMAL HIGH (ref 0.57–1.00)
Globulin, Total: 2.9 g/dL (ref 1.5–4.5)
Glucose: 343 mg/dL — ABNORMAL HIGH (ref 70–99)
Potassium: 5.3 mmol/L — ABNORMAL HIGH (ref 3.5–5.2)
Sodium: 134 mmol/L (ref 134–144)
Total Protein: 7.2 g/dL (ref 6.0–8.5)
eGFR: 34 mL/min/{1.73_m2} — ABNORMAL LOW

## 2024-11-10 LAB — CBC
Hematocrit: 42.4 % (ref 34.0–46.6)
Hemoglobin: 13.1 g/dL (ref 11.1–15.9)
MCH: 28 pg (ref 26.6–33.0)
MCHC: 30.9 g/dL — ABNORMAL LOW (ref 31.5–35.7)
MCV: 91 fL (ref 79–97)
Platelets: 410 10*3/uL (ref 150–450)
RBC: 4.68 x10E6/uL (ref 3.77–5.28)
RDW: 14.2 % (ref 11.7–15.4)
WBC: 11.3 10*3/uL — ABNORMAL HIGH (ref 3.4–10.8)

## 2024-11-10 LAB — BRAIN NATRIURETIC PEPTIDE: BNP: 45.1 pg/mL (ref 0.0–100.0)

## 2024-11-10 LAB — TSH: TSH: 4.35 u[IU]/mL (ref 0.450–4.500)

## 2024-11-12 NOTE — Progress Notes (Signed)
 BNP resulted as normal, no concerns for heart failure.

## 2024-11-13 ENCOUNTER — Encounter: Admitting: Emergency Medicine

## 2024-11-13 DIAGNOSIS — Z952 Presence of prosthetic heart valve: Secondary | ICD-10-CM

## 2024-11-13 DIAGNOSIS — Z48812 Encounter for surgical aftercare following surgery on the circulatory system: Secondary | ICD-10-CM | POA: Diagnosis not present

## 2024-11-13 NOTE — Progress Notes (Signed)
 Daily Session Note  Patient Details  Name: Shari Lee MRN: [id number] Date of Birth: 26-Apr-1956 Referring Provider:   Flowsheet Row Cardiac Rehab from 08/23/2024 in [facility] Cardiac and Pulmonary Rehab  Referring Provider Darliss Rogue, MD    Encounter Date: 11/13/2024  Check In:  Session Check In - 11/13/24 1050       Check-In   Supervising physician immediately available to respond to emergencies See telemetry face sheet for immediately available ER MD    Location [facility]-Cardiac & Pulmonary Rehab    Staff Present Leita Franks RN,BSN;Maxon Burnell BS, Exercise Physiologist;Margaret Best, MS, Exercise Physiologist;Noah Tickle, BS, Exercise Physiologist    Virtual Visit No    Medication changes reported     No    Fall or balance concerns reported    No    Warm-up and Cool-down Performed on first and last piece of equipment    Resistance Training Performed Yes    VAD Patient? No    PAD/SET Patient? No      Pain Assessment   Currently in Pain? No/denies             Tobacco Use History[1]  Goals Met:  Independence with exercise equipment Exercise tolerated well No report of concerns or symptoms today Strength training completed today  Goals Unmet:  Not Applicable  Comments: Pt able to follow exercise prescription today without complaint.  Will continue to monitor for progression.    Dr. Oneil Pinal is Medical Director for [facility] Cardiac Rehabilitation.  Dr. Fuad Aleskerov is Medical Director for [facility] Pulmonary Rehabilitation.    [1]  Social History Tobacco Use  Smoking Status Never  Smokeless Tobacco Never

## 2024-11-13 NOTE — Progress Notes (Signed)
 Patient notified of this information and scheduled to come in 11/16/24

## 2024-11-14 ENCOUNTER — Encounter

## 2024-11-14 DIAGNOSIS — Z952 Presence of prosthetic heart valve: Secondary | ICD-10-CM

## 2024-11-14 DIAGNOSIS — Z48812 Encounter for surgical aftercare following surgery on the circulatory system: Secondary | ICD-10-CM | POA: Diagnosis not present

## 2024-11-14 NOTE — Progress Notes (Signed)
 Daily Session Note  Patient Details  Name: Shari Lee MRN: [id number] Date of Birth: February 09, 1956 Referring Provider:   Flowsheet Row Cardiac Rehab from 08/23/2024 in [facility] Cardiac and Pulmonary Rehab  Referring Provider Darliss Rogue, MD    Encounter Date: 11/14/2024  Check In:  Session Check In - 11/14/24 1413       Check-In   Supervising physician immediately available to respond to emergencies See telemetry face sheet for immediately available ER MD    Location [facility]-Cardiac & Pulmonary Rehab    Staff Present Leita Franks RN,BSN;Joseph Rolinda RCP,RRT,BSRT;Kelly Bollinger RN,BSN,MPA;Kelly Dyane BS, ACSM CEP, Exercise Physiologist    Virtual Visit No    Medication changes reported     No    Fall or balance concerns reported    No    Warm-up and Cool-down Performed on first and last piece of equipment    Resistance Training Performed Yes    VAD Patient? No    PAD/SET Patient? No      Pain Assessment   Currently in Pain? No/denies             Tobacco Use History[1]  Goals Met:  Independence with exercise equipment Exercise tolerated well No report of concerns or symptoms today Strength training completed today  Goals Unmet:  Not Applicable  Comments: Pt able to follow exercise prescription today without complaint.  Will continue to monitor for progression.    Dr. Oneil Pinal is Medical Director for [facility] Cardiac Rehabilitation.  Dr. Fuad Aleskerov is Medical Director for [facility] Pulmonary Rehabilitation.    [1]  Social History Tobacco Use  Smoking Status Never  Smokeless Tobacco Never

## 2024-11-15 ENCOUNTER — Encounter

## 2024-11-16 ENCOUNTER — Ambulatory Visit: Admitting: Family Medicine

## 2024-11-16 ENCOUNTER — Ambulatory Visit: Payer: Self-pay | Admitting: Family Medicine

## 2024-11-16 ENCOUNTER — Encounter: Payer: Self-pay | Admitting: Family Medicine

## 2024-11-16 VITALS — BP 135/50 | HR 92 | Ht 62.0 in | Wt 211.0 lb

## 2024-11-16 DIAGNOSIS — Z794 Long term (current) use of insulin: Secondary | ICD-10-CM

## 2024-11-16 DIAGNOSIS — I152 Hypertension secondary to endocrine disorders: Secondary | ICD-10-CM

## 2024-11-16 DIAGNOSIS — E1165 Type 2 diabetes mellitus with hyperglycemia: Secondary | ICD-10-CM

## 2024-11-16 DIAGNOSIS — E785 Hyperlipidemia, unspecified: Secondary | ICD-10-CM

## 2024-11-16 DIAGNOSIS — Z952 Presence of prosthetic heart valve: Secondary | ICD-10-CM

## 2024-11-16 DIAGNOSIS — E1159 Type 2 diabetes mellitus with other circulatory complications: Secondary | ICD-10-CM

## 2024-11-16 DIAGNOSIS — E1169 Type 2 diabetes mellitus with other specified complication: Secondary | ICD-10-CM

## 2024-11-16 DIAGNOSIS — I10 Essential (primary) hypertension: Secondary | ICD-10-CM

## 2024-11-16 DIAGNOSIS — E875 Hyperkalemia: Secondary | ICD-10-CM

## 2024-11-16 LAB — BASIC METABOLIC PANEL WITH GFR
BUN: 24 mg/dL — ABNORMAL HIGH (ref 6–23)
CO2: 26 meq/L (ref 19–32)
Calcium: 9.6 mg/dL (ref 8.4–10.5)
Chloride: 101 meq/L (ref 96–112)
Creatinine, Ser: 1.39 mg/dL — ABNORMAL HIGH (ref 0.40–1.20)
GFR: 39.16 mL/min — ABNORMAL LOW
Glucose, Bld: 235 mg/dL — ABNORMAL HIGH (ref 70–99)
Potassium: 4.6 meq/L (ref 3.5–5.1)
Sodium: 138 meq/L (ref 135–145)

## 2024-11-16 LAB — LIPID PANEL
Cholesterol: 198 mg/dL (ref 28–200)
HDL: 58.3 mg/dL
LDL Cholesterol: 118 mg/dL — ABNORMAL HIGH (ref 10–99)
NonHDL: 139.47
Total CHOL/HDL Ratio: 3
Triglycerides: 105 mg/dL (ref 10.0–149.0)
VLDL: 21 mg/dL (ref 0.0–40.0)

## 2024-11-16 LAB — HEMOGLOBIN A1C: Hgb A1c MFr Bld: 10.9 % — ABNORMAL HIGH (ref 4.6–6.5)

## 2024-11-16 MED ORDER — EZETIMIBE 10 MG PO TABS: 10.0000 mg | ORAL_TABLET | Freq: Every day | ORAL | 0 refills | Status: AC

## 2024-11-16 NOTE — Assessment & Plan Note (Signed)
 Blood pressure is at goal for age and co-morbidities.   Recommendations: continue current regimen; following with cardiology  - BP goal <130/80 - monitor and log blood pressures at home - check around the same time each day in a relaxed setting - Limit salt to <2000 mg/day - Follow DASH eating plan (heart healthy diet) - limit alcohol to 2 standard drinks per day for men and 1 per day for women - avoid tobacco products - get at least 2 hours of regular aerobic exercise weekly Patient aware of signs/symptoms requiring further/urgent evaluation.

## 2024-11-16 NOTE — Assessment & Plan Note (Signed)
 Blood glucose elevated on recent labs. A1c re-evaluation needed for glycemic control assessment. Potential insulin  and Farxiga  dosage adjustment based on A1c. - Ordered A1c test. - Will adjust insulin  and Farxiga  dosages based on A1c results. - She believes she is due to update some paperwork regarding cost assistance for her insulin  and Farxiga  - will forward message to Tammy/pharmacy to help address this.

## 2024-11-16 NOTE — Assessment & Plan Note (Signed)
 Medication management: Zetia  (statins caused memory issues) Lifestyle factors for lowering cholesterol include: Diet therapy - heart-healthy diet rich in fruits, veggies, fiber-rich whole grains, lean meats, chicken, fish (at least twice a week), fat-free or 1% dairy products; foods low in saturated/trans fats, cholesterol, sodium, and sugar. Mediterranean diet has shown to be very heart healthy. Regular exercise - recommend at least 30 minutes a day, 5 times per week Weight management

## 2024-11-16 NOTE — Assessment & Plan Note (Signed)
 Doing well overall. Following with cardiology.

## 2024-11-16 NOTE — Progress Notes (Signed)
 "  Established Patient Office Visit Subjective:  Patient ID: Shari Lee, female    DOB: 02/02/1956  Age: 69 y.o. MRN: [id number]  CC:  Chief Complaint  Patient presents with   Medical Management of Chronic Issues      HPI Shari Lee is here for 68-month follow up. She has been doing well.    Hypertension; Aortic Valve Stenosis s/p TAVR, Atrial Fibrillation: - Medications: Amlodipine  10 mg daily, benazepril  40 mg daily, HCTZ 12.5 mg daily - Followed by Cardiology and Cardiothoracic, last office visit on 11/08/24.  - Compliance: good - Checking BP at home: occasionally, well controlled  - Denies any SOB, recurrent headaches, CP, vision changes, LE edema, dizziness, palpitations, or medication side effects.     Diabetes: - Checking glucose at home: no - Medications: Farxiga  10 mg daily, Novolog  10-12 units three times daily with meals, and [name]  23 units daily.  - Compliance: good - Eye exam: 08/24/2024, no retinopathy - Foot exam: 10/20/2023, updated today  - Microalbumin: 01/24/2024, normal - Denies symptoms of hypoglycemia, polyuria, polydipsia, numbness extremities, foot ulcers/trauma, wounds that are not healing, medication side effects  Lab Results  Component Value Date   HGBA1C 8.9 (H) 07/03/2024    Asthma: - PRN albuterol , Wixela BID - ran out a week ago, was going to be too expensive to refill  - No recent flares. Not needing albuterol  often.     Past Medical History:  Diagnosis Date   Asthma    mild   Diarrhea    N/V recently   Dyspnea    GERD (gastroesophageal reflux disease)    Headache    Heart murmur    developed in last couple years/ no issues   HOH (hard of hearing)    wears aids   Hypertension    Motion sickness    car   Sleep apnea    Type 2 diabetes mellitus with hyperglycemia (HCC)     Past Surgical History:  Procedure Laterality Date   ABDOMINAL HYSTERECTOMY  2006   AORTIC VALVE REPLACEMENT N/A 07/05/2024   Procedure:  REPLACEMENT, AORTIC VALVE, OPEN USING INSPIRIS VALVE;  Surgeon: Shyrl Linnie KIDD, MD;  Location: [facility];  Service: Open Heart Surgery;  Laterality: N/A;   BREAST BIOPSY Left 2005   benign   BREAST EXCISIONAL BIOPSY Left 2005?    -benign   CATARACT EXTRACTION W/PHACO Right 11/30/2022   Procedure: CATARACT EXTRACTION PHACO AND INTRAOCULAR LENS PLACEMENT (IOC) RIGHT DIABETIC;  Surgeon: Jaye Fallow, MD;  Location: [facility];  Service: Ophthalmology;  Laterality: Right;  4.96 0:37.3   CATARACT EXTRACTION W/PHACO Left 12/14/2022   Procedure: CATARACT EXTRACTION PHACO AND INTRAOCULAR LENS PLACEMENT (IOC) LEFT DIABETIC;  Surgeon: Jaye Fallow, MD;  Location: [facility];  Service: Ophthalmology;  Laterality: Left;  5.98 0:40.6   COLONOSCOPY WITH PROPOFOL  N/A 08/13/2016   Procedure: COLONOSCOPY WITH PROPOFOL ;  Surgeon: Rogelia Copping, MD;  Location: [facility];  Service: Endoscopy;  Laterality: N/A;   COLONOSCOPY WITH PROPOFOL  N/A 02/09/2022   Procedure: COLONOSCOPY WITH PROPOFOL ;  Surgeon: Copping Rogelia, MD;  Location: [facility] ENDOSCOPY;  Service: Endoscopy;  Laterality: N/A;   POLYPECTOMY  08/13/2016   Procedure: POLYPECTOMY;  Surgeon: Rogelia Copping, MD;  Location: [facility];  Service: Endoscopy;;   RIGHT HEART CATH AND CORONARY ANGIOGRAPHY N/A 03/26/2024   Procedure: RIGHT HEART CATH AND CORONARY ANGIOGRAPHY;  Surgeon: Darron Deatrice LABOR, MD;  Location: [facility] INVASIVE CV LAB;  Service: Cardiovascular;  Laterality: N/A;  TEE WITHOUT CARDIOVERSION N/A 07/05/2024   Procedure: ECHOCARDIOGRAM, TRANSESOPHAGEAL;  Surgeon: Shyrl Linnie KIDD, MD;  Location: [facility] OR;  Service: Open Heart Surgery;  Laterality: N/A;    Family History  Problem Relation Age of Onset   Heart attack Mother    Diabetes Mother    Heart disease Mother    Heart failure Father    Breast cancer Sister    Sleep apnea Brother    Diverticulitis Brother    Diverticulitis Sister     Social  History   Socioeconomic History   Marital status: Divorced    Spouse name: Not on file   Number of children: Not on file   Years of education: Not on file   Highest education level: 9th grade  Occupational History   Not on file  Tobacco Use   Smoking status: Never   Smokeless tobacco: Never  Vaping Use   Vaping status: Never Used  Substance and Sexual Activity   Alcohol use: No    Alcohol/week: 0.0 standard drinks of alcohol   Drug use: No   Sexual activity: Not Currently  Other Topics Concern   Not on file  Social History Narrative   Not on file   Social Drivers of Health   Tobacco Use: Low Risk (11/16/2024)   Patient History    Smoking Tobacco Use: Never    Smokeless Tobacco Use: Never    Passive Exposure: Not on file  Financial Resource Strain: Medium Risk (08/28/2024)   Overall Financial Resource Strain (CARDIA)    Difficulty of Paying Living Expenses: Somewhat hard  Food Insecurity: Food Insecurity Present (08/28/2024)   Epic    Worried About Programme Researcher, Broadcasting/film/video in the Last Year: Sometimes true    Ran Out of Food in the Last Year: Never true  Transportation Needs: No Transportation Needs (08/28/2024)   Epic    Lack of Transportation (Medical): No    Lack of Transportation (Non-Medical): No  Physical Activity: Inactive (08/28/2024)   Exercise Vital Sign    Days of Exercise per Week: 0 days    Minutes of Exercise per Session: Not on file  Stress: No Stress Concern Present (08/28/2024)   [employer] of Occupational Health - Occupational Stress Questionnaire    Feeling of Stress: Not at all  Social Connections: Moderately Integrated (08/28/2024)   Social Connection and Isolation Panel    Frequency of Communication with Friends and Family: More than three times a week    Frequency of Social Gatherings with Friends and Family: Twice a week    Attends Religious Services: More than 4 times per year    Active Member of Clubs or Organizations: Yes    Attends  Banker Meetings: More than 4 times per year    Marital Status: Divorced  Intimate Partner Violence: Not At Risk (07/06/2024)   Epic    Fear of Current or Ex-Partner: No    Emotionally Abused: No    Physically Abused: No    Sexually Abused: No  Depression (PHQ2-9): Low Risk (10/23/2024)   Depression (PHQ2-9)    PHQ-2 Score: 4  Recent Concern: Depression (PHQ2-9) - Medium Risk (09/27/2024)   Depression (PHQ2-9)    PHQ-2 Score: 5  Alcohol Screen: Low Risk (03/21/2023)   Alcohol Screen    Last Alcohol Screening Score (AUDIT): 0  Housing: High Risk (08/28/2024)   Epic    Unable to Pay for Housing in the Last Year: Yes    Number of Times Moved in  the Last Year: 0    Homeless in the Last Year: No  Utilities: Not At Risk (07/06/2024)   Epic    Threatened with loss of utilities: No  Health Literacy: Not on file    ROS All ROS negative except what is listed in the HPI.   Objective:   Today's Vitals: BP (!) 135/50   Pulse 92   Ht 5' 2 (1.575 m)   Wt 211 lb (95.7 kg)   SpO2 99%   BMI 38.59 kg/m   Physical Exam Vitals reviewed.  Constitutional:      Appearance: Normal appearance. She is obese.  Cardiovascular:     Rate and Rhythm: Normal rate and regular rhythm.     Heart sounds: Murmur heard.  Pulmonary:     Effort: Pulmonary effort is normal.     Breath sounds: Normal breath sounds.  Musculoskeletal:     Right lower leg: No edema.     Left lower leg: No edema.  Skin:    General: Skin is warm and dry.  Neurological:     Mental Status: She is alert and oriented to person, place, and time.  Psychiatric:        Mood and Affect: Mood normal.        Behavior: Behavior normal.        Thought Content: Thought content normal.        Judgment: Judgment normal.    Diabetic foot exam was performed.  No deformities or other abnormal visual findings.  Posterior tibialis and dorsalis pulse intact bilaterally.  Intact to touch and monofilament testing bilaterally.      Assessment & Plan:   Problem List Items Addressed This Visit       Active Problems   Type 2 diabetes mellitus with hyperglycemia (HCC) - Primary   Blood glucose elevated on recent labs. A1c re-evaluation needed for glycemic control assessment. Potential insulin  and Farxiga  dosage adjustment based on A1c. - Ordered A1c test. - Will adjust insulin  and Farxiga  dosages based on A1c results. - She believes she is due to update some paperwork regarding cost assistance for her insulin  and Farxiga  - will forward message to Tammy/pharmacy to help address this.       Relevant Orders   Hemoglobin A1c   Basic metabolic panel with GFR   Hypertension associated with diabetes (HCC)   Blood pressure is at goal for age and co-morbidities.   Recommendations: continue current regimen; following with cardiology  - BP goal <130/80 - monitor and log blood pressures at home - check around the same time each day in a relaxed setting - Limit salt to <2000 mg/day - Follow DASH eating plan (heart healthy diet) - limit alcohol to 2 standard drinks per day for men and 1 per day for women - avoid tobacco products - get at least 2 hours of regular aerobic exercise weekly Patient aware of signs/symptoms requiring further/urgent evaluation.        Relevant Medications   ezetimibe  (ZETIA ) 10 MG tablet   Hyperlipidemia associated with type 2 diabetes mellitus (HCC)   Medication management: Zetia  (statins caused memory issues) Lifestyle factors for lowering cholesterol include: Diet therapy - heart-healthy diet rich in fruits, veggies, fiber-rich whole grains, lean meats, chicken, fish (at least twice a week), fat-free or 1% dairy products; foods low in saturated/trans fats, cholesterol, sodium, and sugar. Mediterranean diet has shown to be very heart healthy. Regular exercise - recommend at least 30 minutes a day, 5 times per  week Weight management         Relevant Medications   ezetimibe  (ZETIA )  10 MG tablet   Other Relevant Orders   Lipid panel   S/P AVR (aortic valve replacement)   Doing well overall. Following with cardiology.       Obesity, morbid (HCC)   Encouraged heart healthy, low carb diet and regular exercise.  Labs today.       Other Visit Diagnoses       Essential (primary) hypertension       Relevant Medications   ezetimibe  (ZETIA ) 10 MG tablet   Other Relevant Orders   Basic metabolic panel with GFR     Hyperkalemia       Relevant Orders   Basic metabolic panel with GFR      Assessment and Plan Assessment & Plan Type 2 diabetes mellitus with hyperglycemia   Essential hypertension Blood pressure managed with amlodipine , benazepril , and hydrochlorothiazide . Metoprolol  and amiodarone  discontinued. Well-controlled BP with dizziness episodes likely from orthostatic hypotension and dehydration. - Encouraged increased fluid intake. - Advised wearing compression socks to prevent orthostatic hypotension. - Instructed to change positions slowly to prevent dizziness.  Hyperkalemia Previous labs showed slightly elevated potassium, possibly from dehydration or diet. - Ordered electrolyte panel to reassess potassium levels. - Advised on dietary modifications to reduce potassium intake.  General health maintenance Routine health maintenance includes cholesterol monitoring and lung nodule follow-up. Lung nodule small, follow-up considered due to secondhand smoke exposure. - Ordered cholesterol panel. - Will consider follow-up imaging for lung nodule in July.  ***review   ***message tammy about meds    Follow-up: Return in about 6 months (around 05/16/2025) for chronic disease management.   Waddell FURY Almarie, DNP, FNP-C  I,Emily Lagle,acting as a neurosurgeon for Waddell KATHEE Almarie, NP.,have documented all relevant documentation on the behalf of Waddell KATHEE Almarie, NP.  I, Waddell KATHEE Almarie, NP, have reviewed all documentation for this visit. The documentation on 11/16/2024  for the exam, diagnosis, procedures, and orders are all accurate and complete.  "

## 2024-11-16 NOTE — Assessment & Plan Note (Signed)
 Encouraged heart healthy, low carb diet and regular exercise.  Labs today.

## 2024-11-17 ENCOUNTER — Encounter: Payer: Self-pay | Admitting: Family Medicine

## 2024-11-19 ENCOUNTER — Other Ambulatory Visit

## 2024-11-20 ENCOUNTER — Encounter

## 2024-11-20 ENCOUNTER — Encounter: Payer: Self-pay | Admitting: Neurology

## 2024-11-20 DIAGNOSIS — Z952 Presence of prosthetic heart valve: Secondary | ICD-10-CM

## 2024-11-21 ENCOUNTER — Other Ambulatory Visit

## 2024-11-21 ENCOUNTER — Telehealth: Payer: Self-pay

## 2024-11-21 DIAGNOSIS — Z794 Long term (current) use of insulin: Secondary | ICD-10-CM

## 2024-11-21 MED ORDER — INSULIN ASPART 100 UNIT/ML FLEXPEN: PEN_INJECTOR | SUBCUTANEOUS | 0 refills | Status: AC

## 2024-11-21 NOTE — Telephone Encounter (Signed)
 PAP: Patient assistance application for Farxiga through [employer] ([employer]) has been mailed to pt's home address on file. Provider portion of application will be faxed to provider's office.

## 2024-11-21 NOTE — Progress Notes (Signed)
 "  11/21/2024 Name: Shari Lee MRN: [id number] DOB: 1956/02/02  Chief Complaint  Patient presents with   Diabetes    Shari Lee is a 69 y.o. year old female who presented for a telephone visit.   They were referred to the pharmacist by their PCP for assistance in managing diabetes and medication access.    Subjective:   Medication Access/Adherence  Current Pharmacy:  [facility] [address]), [place] - [address] [place]) [place] [address] Phone: [phone] Fax: [phone]  [facility] [place], [place] - [address] [place] [address] Phone: [phone] Fax: [phone]  [name] [address] [place] [address] Phone: [phone] Fax: [phone]   Patient reports affordability concerns with their medications: No  Patient reports access/transportation concerns to their pharmacy: No  Patient reports adherence concerns with their medications:  Yes     Diabetes:  Current medications:  [name]  at 23 units once a day in the morning Novolog  / insulin  aspart 10 units prior to each meal. Patient reports sometimes she forgets to take before a meal but takes either during or right after meals. Add 2 units for high carbohydrate meals. Hold if blood glucose is 100 or less. Approved to get thru [employer]o Nordisk thru 10/17/2025 Farxiga  10mg  daily - [place] but has not returned 2026 application yet. She has 1 bottle of 30 tablets on hand.   Patient reports today her schedule has been erratic because her sister is in the hospital. She is possibly going to be moved to rehab soon. Patient also states she has not been ad adherent to diet and taking insulin  as instructed due to stress.   Medications tried in the past: Ozempic  - stopped due to pancreatitis; metformin  - worsened IBS related diarrhea   Diet  Eats out quite a  bit Has been eating 2 meals per day recently. Has not been on regular schedule due to sister being in hospital  Date of Download: 11/08/2024 to 11/21/2024 % Time CGM is active: 87%  Average Glucose: 247  mg/dL Glucose Management Indicator: 9.2%  Glucose Variability: 34.1% (goal <36%)  Time in Goal:  - Time in range 70-180: 78%  - Time in high range 181 to 250: 21% - Time in very high range > 250: 1%  - Time below range: 0%   Blood glucose trends -  Patient has 1 week when blood glucose was very high when her sister was first taken to the hospital. Over the last 5 to 7 days blood glucose has been trending better.       She is attending cardio rehab 2 times per week.  She has continued to use the Wixela inhaler. She is enrolled in [employer] [place] program to help with cost of [place].   Using albuterol  as needed - has not needed in the last 2 weeks.    Wt Readings from Last 3 Encounters:  11/16/24 211 lb (95.7 kg)  11/08/24 212 lb 4 oz (96.3 kg)  09/06/24 200 lb 6.4 oz (90.9 kg)     Objective:  Lab Results  Component Value Date   HGBA1C 10.9 (H) 11/16/2024   BP Readings from Last 3 Encounters:  11/16/24 (!) 135/50  11/08/24 110/60  09/06/24 115/60     Lab Results  Component Value Date   CREATININE 1.39 (H) 11/16/2024   BUN  24 (H) 11/16/2024   NA 138 11/16/2024   K 4.6 11/16/2024   CL 101 11/16/2024   CO2 26 11/16/2024    Lab Results  Component Value Date   CHOL 198 11/16/2024   HDL 58.30 11/16/2024   LDLCALC 118 (H) 11/16/2024   TRIG 105.0 11/16/2024   CHOLHDL 3 11/16/2024    Medications Reviewed Today     Reviewed by Carla Milling, RPH-CPP (Pharmacist) on 11/21/24 at [phone]  Med List Status: <None>   Medication Order Taking? Sig Documenting Provider Last Dose Status Informant  albuterol  (VENTOLIN  HFA) 108 (90 Base) MCG/ACT inhaler [id number] Yes Inhale 2 puffs into the lungs every 6 (six) hours as needed for wheezing or shortness of breath. Almarie Waddell NOVAK,  NP  Active   amLODipine  (NORVASC ) 10 MG tablet [id number] Yes Take 1 tablet (10 mg total) by mouth daily. Gerard Frederick, NP  Active   aspirin  EC 325 MG tablet [id number] Yes Take 1 tablet (325 mg total) by mouth daily. Gold, Wayne E, PA-C  Active   benazepril  (LOTENSIN ) 40 MG tablet [id number] Yes Take 1 tablet (40 mg total) by mouth daily. Gerard Frederick, NP  Active   Blood Glucose Monitoring Suppl (CONTOUR NEXT USB MONITOR) w/Device KIT [id number] Yes 1 kit by Does not apply route daily. To check blood sugar once daily. Vivienne Delon HERO, PA-C  Active Self  CareTouch Safety Lancets 26G MISC [id number] Yes 1 Device by Does not apply route daily. To check blood sugar once daily. Vivienne Delon HERO, PA-C  Active Self  cetirizine  (ZYRTEC ) 10 MG tablet [id number] Yes Take 1 tablet by mouth once daily Drubel, Manuelita, PA-C  Active Self  Continuous Glucose Sensor (FREESTYLE LIBRE 3 PLUS SENSOR) MISC [id number] Yes USE AS DIRECTED. CHANGE SENSOR EVERY 15 DAYS. Domenica Harlene LABOR, MD  Active   dapagliflozin  propanediol (FARXIGA ) 10 MG TABS tablet [id number] Yes Take 1 tablet (10 mg total) by mouth daily. For [place]25 [place], [place], PA-C  Active Self  ezetimibe  (ZETIA ) 10 MG tablet [id number]  Take 1 tablet (10 mg total) by mouth daily. Almarie Waddell NOVAK, NP  Active   fluticasone -salmeterol (WIXELA INHUB) 100-50 MCG/ACT AEPB [id number] Yes Inhale 1 puff into the lungs 2 (two) times daily. Almarie Waddell NOVAK, NP  Active   glucose blood (CONTOUR NEXT TEST) test strip [id number] Yes To check blood sugar once daily. Vivienne Delon HERO, PA-C  Active Self  hydrochlorothiazide  (HYDRODIURIL ) 12.5 MG tablet [id number] Yes Take 1 tablet (12.5 mg total) by mouth daily. Gerard Frederick, NP  Active   insulin  aspart (NOVOLOG ) 100 UNIT/ML FlexPen [id number] Yes Inject 4 to 10 units up to 3 times a day with a meal.  Patient taking differently: Inject 10-12 units up to 3 times a day with a meal.   Cyndi Manuelita, PA-C  Active Self  insulin  degludec ([name]  FLEXTOUCH) 100 UNIT/ML FlexTouch Pen [id number] Yes Inject 15 Units into the skin daily.  Patient taking differently: Inject 23 Units into the skin daily.   Carla Milling, RPH-CPP  Active   Insulin  Pen Needle (NOVOFINE PLUS) 32G X 4 MM MISC [id number] Yes To use with ozempic  injections Burnette, Jennifer M, PA-C  Active Self  montelukast  (SINGULAIR ) 10 MG tablet [id number] Yes TAKE 1 TABLET BY MOUTH AT BEDTIME Cyndi Manuelita, PA-C  Active Self  omeprazole  (PRILOSEC ) 20 MG capsule [id number] Yes Take 20 mg by mouth daily. [provider]  Active  Assessment/Plan:  Diabetes: blood glucose and GMI over the last 2 weeks has not been at goal but had improved - Continue [name]  to 23 units once a day.  -  Continue Novolog  / insulin  aspart 10 units prior to each meal. She will add 2 additional unit if having a meal that is high in carbohydrates or sweets- like cinnamon rolls, cereal, pasta, rice, potatoes or bread. (Recommended if she has hash browns with breakfast she should add the 2 extra units) Hold if blood glucose is 100 or less. Discussed importance of taking with each meal to prevent blood glucose excursions.  - Discussed fatigue that can occur with managing chronic conditions such as diabetes. Provided support and encouragement to patient. Set goal of checking blood glucose prior to meals and using her Novolog  prior to all meals. Reminded that its best to take Novolog  prior to meal to get best effect and prevent prandial hyperglycemia.  - Discussed limiting intake of high carbohydrate foods - bread, biscuits, potatoes.  - Reminded patient she has Novolog  and [name]  from patient assistance program to pick up at our office.  - Continue Farxiga  10mg  daily. Will send request to Med Assist Team to mail another [place] for Farxiga  to patient. - Continue to use Libre 3 sensors to check blood glucose  continuously   Follow up in 1 week.    [name], PharmD Clinical Pharmacist [facility]  Population Health [phone]    "

## 2024-11-22 ENCOUNTER — Encounter: Admitting: Emergency Medicine

## 2024-11-22 DIAGNOSIS — Z952 Presence of prosthetic heart valve: Secondary | ICD-10-CM

## 2024-11-22 NOTE — Progress Notes (Signed)
 Daily Session Note  Patient Details  Name: Shari Lee MRN: [id number] Date of Birth: 24-Feb-1956 Referring Provider:   Flowsheet Row Cardiac Rehab from 08/23/2024 in [facility]  Referring Provider Darliss Rogue, MD    Encounter Date: 11/22/2024  Check In:  Session Check In - 11/22/24 1127       Check-In   Supervising physician immediately available to respond to emergencies See telemetry face sheet for immediately available ER MD    Location [facility]    Staff Present Fairy Plater RCP,RRT,BSRT;Rhyse Loux RN,BSN;Maxon Burnell BS, Exercise Physiologist;Noah Tickle, BS, Exercise Physiologist    Virtual Visit No    Medication changes reported     No    Fall or balance concerns reported    No    Warm-up and Cool-down Performed on first and last piece of equipment    Resistance Training Performed Yes    VAD Patient? No    PAD/SET Patient? No      Pain Assessment   Currently in Pain? No/denies             Tobacco Use History[1]  Goals Met:  Independence with exercise equipment Exercise tolerated well No report of concerns or symptoms today Strength training completed today  Goals Unmet:  Not Applicable  Comments: Pt able to follow exercise prescription today without complaint.  Will continue to monitor for progression.    Dr. Oneil Pinal is Medical Director for [facility] Card[facility] Rehabilitation.  Dr. Fuad Aleskerov is Medical Director for [facility] Pulmonary Rehabilitation.    [1]  Social History Tobacco Use  Smoking Status Never  Smokeless Tobacco Never

## 2024-11-27 ENCOUNTER — Encounter

## 2024-11-28 ENCOUNTER — Other Ambulatory Visit

## 2024-11-29 ENCOUNTER — Encounter

## 2024-12-04 ENCOUNTER — Encounter

## 2024-12-06 ENCOUNTER — Encounter

## 2024-12-11 ENCOUNTER — Encounter

## 2024-12-13 ENCOUNTER — Encounter

## 2024-12-18 ENCOUNTER — Encounter

## 2024-12-20 ENCOUNTER — Encounter

## 2025-01-10 ENCOUNTER — Ambulatory Visit: Admitting: Cardiology
